# Patient Record
Sex: Female | Born: 1995 | Race: White | Hispanic: No | Marital: Single | Smoking: Former smoker
Health system: Southern US, Community
[De-identification: ages and names within clinical notes are randomized; demographics above are authoritative.]

## PROBLEM LIST (undated history)

## (undated) ENCOUNTER — Inpatient Hospital Stay (HOSPITAL_COMMUNITY): Payer: Self-pay

## (undated) VITALS — BP 84/58 | HR 75 | Temp 98.4°F | Resp 16 | Ht 65.55 in | Wt 219.4 lb

## (undated) DIAGNOSIS — T8859XA Other complications of anesthesia, initial encounter: Secondary | ICD-10-CM

## (undated) DIAGNOSIS — K219 Gastro-esophageal reflux disease without esophagitis: Secondary | ICD-10-CM

## (undated) DIAGNOSIS — Z9889 Other specified postprocedural states: Secondary | ICD-10-CM

## (undated) DIAGNOSIS — E669 Obesity, unspecified: Secondary | ICD-10-CM

## (undated) DIAGNOSIS — R569 Unspecified convulsions: Secondary | ICD-10-CM

## (undated) DIAGNOSIS — F419 Anxiety disorder, unspecified: Secondary | ICD-10-CM

## (undated) DIAGNOSIS — F329 Major depressive disorder, single episode, unspecified: Secondary | ICD-10-CM

## (undated) DIAGNOSIS — A6000 Herpesviral infection of urogenital system, unspecified: Secondary | ICD-10-CM

## (undated) DIAGNOSIS — F32A Depression, unspecified: Secondary | ICD-10-CM

## (undated) DIAGNOSIS — D649 Anemia, unspecified: Secondary | ICD-10-CM

## (undated) DIAGNOSIS — T4145XA Adverse effect of unspecified anesthetic, initial encounter: Secondary | ICD-10-CM

## (undated) DIAGNOSIS — X838XXA Intentional self-harm by other specified means, initial encounter: Secondary | ICD-10-CM

## (undated) DIAGNOSIS — R112 Nausea with vomiting, unspecified: Secondary | ICD-10-CM

## (undated) DIAGNOSIS — G4700 Insomnia, unspecified: Secondary | ICD-10-CM

## (undated) HISTORY — PX: COSMETIC SURGERY: SHX468

---
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
## 2017-01-02 ENCOUNTER — Ambulatory Visit (INDEPENDENT_AMBULATORY_CARE_PROVIDER_SITE_OTHER): Payer: Medicaid Other | Admitting: Family Medicine

## 2017-01-02 ENCOUNTER — Encounter: Payer: Self-pay | Admitting: Family Medicine

## 2017-01-02 VITALS — BP 107/72 | HR 79 | Temp 97.2°F | Ht 64.0 in | Wt 238.0 lb

## 2017-01-02 DIAGNOSIS — R140 Abdominal distension (gaseous): Secondary | ICD-10-CM

## 2017-01-02 DIAGNOSIS — K921 Melena: Secondary | ICD-10-CM

## 2017-01-02 DIAGNOSIS — R110 Nausea: Secondary | ICD-10-CM

## 2017-01-02 DIAGNOSIS — N926 Irregular menstruation, unspecified: Secondary | ICD-10-CM | POA: Diagnosis not present

## 2017-01-02 DIAGNOSIS — R197 Diarrhea, unspecified: Secondary | ICD-10-CM

## 2017-01-02 LAB — PREGNANCY, URINE: Preg Test, Ur: NEGATIVE

## 2017-01-02 NOTE — Progress Notes (Signed)
BP 107/72   Pulse 79   Temp (!) 97.2 F (36.2 C) (Oral)   Ht '5\' 4"'  (1.626 m)   Wt 238 lb (108 kg)   BMI 40.85 kg/m    Subjective:    Patient ID: Colleen Valentine, female    DOB: [birth year], 21 y.o.   MRN: [id number]  HPI: Colleen Valentine is a 21 y.o. female presenting on 01/02/2017 for Abdominal Pain (pt here today c/o diarrhea and constipation, rectal bleeding and severe bloating.)   HPI Abdominal pain and diarrhea and bloating and constipation Patient comes in complaining of abdominal pain and diarrhea bloating and constipation and bright red blood in stool and dark stools that has been bothering her since about 3-4 months ago. She says she will alternate into being constipated at times flip right back into diarrhea. She says she had 22 episodes of diarrhea but most are very small yesterday. She does feel little relief from the stools but then it comes right back. She was seen bright red blood that has been reduced over the past week but she has had some darker stools as well. She denies any fevers but has had some chills. She denies any pain radiating anywhere else. She denies any urinary problems or symptoms or vaginal issues or discharge.  Patient is sexually active and has not used any form of contraception. Her last menstrual period was July 16, there is a chance that she could possibly be pregnant per her. We will run a urine pregnancy today.  Relevant past medical, surgical, family and social history reviewed and updated as indicated. Interim medical history since our last visit reviewed. Allergies and medications reviewed and updated.  Review of Systems  Constitutional: Negative for chills and fever.  Eyes: Negative for visual disturbance.  Respiratory: Negative for chest tightness and shortness of breath.   Cardiovascular: Negative for chest pain and leg swelling.  Gastrointestinal: Positive for abdominal distention, abdominal pain, anal bleeding, blood in stool, constipation,  diarrhea and nausea. Negative for rectal pain and vomiting.  Genitourinary: Negative for decreased urine volume, difficulty urinating, dysuria, flank pain, frequency, hematuria, pelvic pain, vaginal bleeding, vaginal discharge and vaginal pain.  Musculoskeletal: Negative for back pain and gait problem.  Skin: Negative for rash.  Neurological: Negative for dizziness, light-headedness and headaches.  Psychiatric/Behavioral: Negative for agitation and behavioral problems.  All other systems reviewed and are negative.   Per HPI unless specifically indicated above        Objective:    BP 107/72   Pulse 79   Temp (!) 97.2 F (36.2 C) (Oral)   Ht '5\' 4"'  (1.626 m)   Wt 238 lb (108 kg)   BMI 40.85 kg/m   Wt Readings from Last 3 Encounters:  01/02/17 238 lb (108 kg) (>99 %, Z= 2.38)*  11/15/16 242 lb (109.8 kg) (>99 %, Z= 2.41)*  10/09/16 250 lb (113.4 kg) (>99 %, Z= 2.47)*   * Growth percentiles are based on CDC 2-20 Years data.    Physical Exam  Constitutional: She is oriented to person, place, and time. She appears well-developed and well-nourished. No distress.  Eyes: Conjunctivae are normal.  Cardiovascular: Normal rate, regular rhythm, normal heart sounds and intact distal pulses.   No murmur heard. Pulmonary/Chest: Effort normal and breath sounds normal. No respiratory distress. She has no wheezes. She has no rales.  Abdominal: Soft. She exhibits no distension. There is no hepatosplenomegaly. There is tenderness in the periumbilical area. There is no rigidity, no rebound,  no guarding and no CVA tenderness.  Musculoskeletal: Normal range of motion. She exhibits no edema or tenderness.  Neurological: She is alert and oriented to person, place, and time. Coordination normal.  Skin: Skin is warm and dry. No rash noted. She is not diaphoretic.  Psychiatric: She has a normal mood and affect. Her behavior is normal.  Nursing note and vitals reviewed.  Urine preg neg       Assessment & Plan:   Problem List Items Addressed This Visit    None    Visit Diagnoses    Diarrhea, unspecified type    -  Primary   Patient has chronic diarrhea bloating and blood in her stool is been going on for 3 or 4 months. She has taken over-the-counter  medications without improvement   Relevant Orders   Cdiff NAA+O+P+Stool Culture   CBC with Differential/Platelet   CMP14+EGFR   Ambulatory referral to Gastroenterology   Blood in stool       Relevant Orders   CBC with Differential/Platelet   CMP14+EGFR   Ambulatory referral to Gastroenterology   Missed period       Relevant Orders   Pregnancy, urine   Nausea       Concern for possible IBS versus IBD   Relevant Orders   CMP14+EGFR   Ambulatory referral to Gastroenterology   Bloating       Relevant Orders   Ambulatory referral to Gastroenterology       Follow up plan: Return if symptoms worsen or fail to improve.  Counseling provided for all of the vaccine components Orders Placed This Encounter  Procedures  . Cdiff NAA+O+P+Stool Culture  . CBC with Differential/Platelet  . Pregnancy, urine  . CMP14+EGFR  . Ambulatory referral to Gastroenterology    Caryl Pina, MD [facility] 01/02/2017, 11:51 AM

## 2017-01-03 LAB — CMP14+EGFR
ALT: 19 IU/L (ref 0–32)
AST: 17 IU/L (ref 0–40)
Albumin/Globulin Ratio: 1.6 (ref 1.2–2.2)
Albumin: 4.3 g/dL (ref 3.5–5.5)
Alkaline Phosphatase: 94 IU/L (ref 43–101)
BUN/Creatinine Ratio: 11 (ref 9–23)
BUN: 7 mg/dL (ref 6–20)
Bilirubin Total: 0.2 mg/dL (ref 0.0–1.2)
CO2: 22 mmol/L (ref 20–29)
Calcium: 9.3 mg/dL (ref 8.7–10.2)
Chloride: 106 mmol/L (ref 96–106)
Creatinine, Ser: 0.66 mg/dL (ref 0.57–1.00)
GFR calc Af Amer: 149 mL/min/{1.73_m2} (ref >59)
GFR calc non Af Amer: 129 mL/min/{1.73_m2} (ref >59)
Globulin, Total: 2.7 g/dL (ref 1.5–4.5)
Glucose: 95 mg/dL (ref 65–99)
Potassium: 4.3 mmol/L (ref 3.5–5.2)
Sodium: 145 mmol/L — ABNORMAL HIGH (ref 134–144)
Total Protein: 7 g/dL (ref 6.0–8.5)

## 2017-01-03 LAB — CBC WITH DIFFERENTIAL/PLATELET
Basophils Absolute: 0 10*3/uL (ref 0.0–0.2)
Basos: 1 %
EOS (ABSOLUTE): 0.3 10*3/uL (ref 0.0–0.4)
Eos: 4 %
Hematocrit: 41.2 % (ref 34.0–46.6)
Hemoglobin: 13.9 g/dL (ref 11.1–15.9)
Immature Grans (Abs): 0 10*3/uL (ref 0.0–0.1)
Immature Granulocytes: 0 %
Lymphocytes Absolute: 2.1 10*3/uL (ref 0.7–3.1)
Lymphs: 28 %
MCH: 31.1 pg (ref 26.6–33.0)
MCHC: 33.7 g/dL (ref 31.5–35.7)
MCV: 92 fL (ref 79–97)
Monocytes Absolute: 0.5 10*3/uL (ref 0.1–0.9)
Monocytes: 6 %
Neutrophils Absolute: 4.6 10*3/uL (ref 1.4–7.0)
Neutrophils: 61 %
Platelets: 265 10*3/uL (ref 150–379)
RBC: 4.47 x10E6/uL (ref 3.77–5.28)
RDW: 13.7 % (ref 12.3–15.4)
WBC: 7.6 10*3/uL (ref 3.4–10.8)

## 2017-01-07 ENCOUNTER — Encounter: Payer: Self-pay | Admitting: Internal Medicine

## 2017-01-14 ENCOUNTER — Telehealth: Payer: Self-pay | Admitting: Family

## 2017-01-14 NOTE — Telephone Encounter (Signed)
Aware,  We have not prescribed any antibiotics for her.  She thinks it may have been th OB clinic.

## 2017-02-27 ENCOUNTER — Other Ambulatory Visit: Payer: Self-pay

## 2017-02-27 ENCOUNTER — Encounter: Payer: Self-pay | Admitting: Gastroenterology

## 2017-02-27 ENCOUNTER — Telehealth: Payer: Self-pay

## 2017-02-27 ENCOUNTER — Ambulatory Visit (INDEPENDENT_AMBULATORY_CARE_PROVIDER_SITE_OTHER): Payer: Medicaid Other | Admitting: Gastroenterology

## 2017-02-27 VITALS — BP 120/87 | HR 83 | Temp 97.5°F | Ht 65.0 in | Wt 234.2 lb

## 2017-02-27 DIAGNOSIS — R1310 Dysphagia, unspecified: Secondary | ICD-10-CM | POA: Diagnosis not present

## 2017-02-27 DIAGNOSIS — G43A0 Cyclical vomiting, not intractable: Secondary | ICD-10-CM | POA: Diagnosis not present

## 2017-02-27 DIAGNOSIS — R1115 Cyclical vomiting syndrome unrelated to migraine: Secondary | ICD-10-CM

## 2017-02-27 DIAGNOSIS — R197 Diarrhea, unspecified: Secondary | ICD-10-CM | POA: Diagnosis not present

## 2017-02-27 DIAGNOSIS — R109 Unspecified abdominal pain: Secondary | ICD-10-CM

## 2017-02-27 DIAGNOSIS — R1010 Upper abdominal pain, unspecified: Secondary | ICD-10-CM

## 2017-02-27 DIAGNOSIS — R1319 Other dysphagia: Secondary | ICD-10-CM

## 2017-02-27 DIAGNOSIS — K219 Gastro-esophageal reflux disease without esophagitis: Secondary | ICD-10-CM | POA: Diagnosis not present

## 2017-02-27 DIAGNOSIS — K625 Hemorrhage of anus and rectum: Secondary | ICD-10-CM | POA: Diagnosis not present

## 2017-02-27 DIAGNOSIS — R112 Nausea with vomiting, unspecified: Secondary | ICD-10-CM | POA: Insufficient documentation

## 2017-02-27 DIAGNOSIS — R1030 Lower abdominal pain, unspecified: Secondary | ICD-10-CM

## 2017-02-27 MED ORDER — DICYCLOMINE HCL 10 MG PO CAPS: 10.0000 mg | ORAL_CAPSULE | Freq: Three times a day (TID) | ORAL | 0 refills | Status: DC

## 2017-02-27 MED ORDER — NA SULFATE-K SULFATE-MG SULF 17.5-3.13-1.6 GM/177ML PO SOLN: 1.0000 | ORAL | 0 refills | Status: DC

## 2017-02-27 MED ORDER — PANTOPRAZOLE SODIUM 40 MG PO TBEC: 40.0000 mg | DELAYED_RELEASE_TABLET | Freq: Every day | ORAL | 3 refills | Status: DC

## 2017-02-27 NOTE — Patient Instructions (Signed)
1. Start pantoprazole 40 mg daily, 30 minutes before breakfast for acid reflux. 2. Start dicyclomine 10 mg, 30 minutes before meals and at bedtime as needed for diarrhea and abdominal cramping. 3. Four to five days prior to your colonoscopy, please stop dicyclomine so that she can adequately clean her bowels out for your study. 4. Colonoscopy and upper endoscopy as scheduled. Please see separate instructions. 5. Please have your labs and stool test done as soon as possible.

## 2017-02-27 NOTE — Progress Notes (Signed)
Primary Care Physician:  Sharion Balloon, FNP  Primary Gastroenterologist:  Garfield Cornea, MD   Chief Complaint  Patient presents with  . Diarrhea  . Blood In Stools  . Heartburn    HPI:  Colleen Valentine is a 21 y.o. female here At the request of Evelina Dun, FNP for further evaluation of  diarrhea, rectal bleeding, heartburn.  Patient states she's been sick for 2 years with regards to GI symptoms. She has a chronic history of GERD, vomiting with it. Planes of upper abdominal bloating/swelling. Bowel movements alternate between constipation and diarrhea. For the past 6 months she's had a lot of rectal bleeding. When she has a bowel movement she develops abdominal pain and rectal pain. Complains of a lot of gas pain. Sometimes passes blood clots with her stools. He states she's had black stools as recently as last week. Complains of dysphagia to solid food. Try omeprazole in the past for reflux but didn't seem to help. Denies NSAID or aspirin use. Notes that she had her gallbladder out for gallstones when she was 17.    Current Outpatient Prescriptions  Medication Sig Dispense Refill  . valACYclovir (VALTREX) 500 MG tablet Take 500 mg by mouth daily.    .      .      .       No current facility-administered medications for this visit.     Allergies as of 02/27/2017  . (No Known Allergies)    Past Medical History:  Diagnosis Date  . Anxiety   . Depression   . Insomnia   . Obesity   . Suicide [facility])     Past Surgical History:  Procedure Laterality Date  . CHOLECYSTECTOMY    . COSMETIC SURGERY  Age 75   dog bite to face    Family History  Problem Relation Age of Onset  . Alcohol abuse Mother   . Stroke Mother   . Bipolar disorder Mother   . Alcohol abuse Father   . Bipolar disorder Father   . Colon cancer Maternal Grandfather   . Crohn's disease Paternal Grandmother   . Crohn's disease Paternal Aunt   . Crohn's disease Paternal Aunt   . Crohn's disease Paternal  Uncle   . Colon cancer Paternal Uncle   . Celiac disease Neg Hx     Social History   Social History  . Marital status: Single    Spouse name: N/A  . Number of children: N/A  . Years of education: N/A   Occupational History  . 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 Smoking status: Current Every Day Smoker    Packs/day: 1.00    Years: 5.00    Types: Cigarettes    Last attempt to quit: 07/08/2012  . Smokeless tobacco: Never Used     Comment: one pack cigarettes daily  . Alcohol use 0.0 oz/week     Comment: social  . Drug use: Yes    Types: Marijuana     Comment: twice  . Sexual activity: Not Currently    Partners: Male    Birth control/ protection: Other-see comments   Other Topics Concern  . Not on file   Social History Narrative   ** Merged History Encounter **          ROS:  General: Negative for anorexia, weight loss, fever, chills, fatigue, weakness. Eyes: Negative for vision changes.  ENT: Negative for hoarseness,  nasal congestion.See history of present illness. CV: Negative for chest pain, angina, palpitations, dyspnea on exertion, peripheral edema.  Respiratory: Negative for dyspnea at rest, dyspnea on exertion, cough, sputum, wheezing.  GI: See history of present illness. GU:  Negative for dysuria, hematuria, urinary incontinence, urinary frequency, nocturnal urination.  MS: Negative for joint pain, low back pain.  Derm: Negative for rash or itching.  Neuro: Negative for weakness, abnormal sensation, seizure, frequent headaches, memory loss, confusion.  Psych: Negative for anxiety, depression, suicidal ideation, hallucinations.  Endo: Negative for unusual weight change.  Heme: Negative for bruising or bleeding. Allergy: Negative for rash or hives.    Physical Examination:  BP 120/87   Pulse 83   Temp (!) 97.5 F (36.4 C) (Oral)   Ht 5\' 5"  (1.651 m)   Wt 234 lb 3.2 oz (106.2 kg)   LMP 02/15/2017   BMI  38.97 kg/m    General: Well-nourished, well-developed in no acute distress.  Head: Normocephalic, atraumatic.   Eyes: Conjunctiva pink, no icterus. Mouth: Oropharyngeal mucosa moist and pink , no lesions erythema or exudate. Neck: Supple without thyromegaly, masses, or lymphadenopathy.  Lungs: Clear to auscultation bilaterally.  Heart: Regular rate and rhythm, no murmurs rubs or gallops.  Abdomen: Bowel sounds are normal, epigastric tenderness palpation, mild lower abdominal tenderness.  nondistended, no hepatosplenomegaly or masses, no abdominal bruits or    hernia , no rebound or guarding.   Rectal: Not performed Extremities: No lower extremity edema. No clubbing or deformities.  Neuro: Alert and oriented x 4 , grossly normal neurologically.  Skin: Warm and dry, no rash or jaundice.   Psych: Alert and cooperative, normal mood and affect.  Labs: Lab Results  Component Value Date   CREATININE 0.66 01/02/2017   BUN 7 01/02/2017   NA 145 (H) 01/02/2017   K 4.3 01/02/2017   CL 106 01/02/2017   CO2 22 01/02/2017   Lab Results  Component Value Date   ALT 19 01/02/2017   AST 17 01/02/2017   ALKPHOS 94 01/02/2017   BILITOT 0.2 01/02/2017   Lab Results  Component Value Date   WBC 7.6 01/02/2017   HGB 13.9 01/02/2017   HCT 41.2 01/02/2017   MCV 92 01/02/2017   PLT 265 01/02/2017     Imaging Studies: No results found.  Impression/plan:  21 year old female with chronic GI symptoms dating back to her teenage years. She got her gallbladder out when she was 41, states she had gallstones. There she has had chronic GERD associated with vomiting for years. Omeprazole previously did not help. Taking over-the-counter agents without relief. Complains of solid food esophageal dysphagia. Medication she has epigastric pain, lower abdominal pain, diarrhea with blood in the stool. Patient has a significant family history for Crohn's disease and colon cancer.  Given chronicity and severity  of her symptoms, I have offered her an EGD/ED and colonoscopy for further evaluation. Plan repeat sedation in the OR.  I have discussed the risks, alternatives, benefits with regards to but not limited to the risk of reaction to medication, bleeding, infection, perforation and the patient is agreeable to proceed. Written consent to be obtained.  We'll go ahead and check labs for celiac disease, lipase as well as GI pathogen panel. Start pantoprazole 40 mg daily. Trial of dicyclomine 10 mg Q before meals daily at bedtime when necessary. She is aware to stop dicyclomine 4-5 days prior to colonoscopy prep.

## 2017-02-27 NOTE — Telephone Encounter (Signed)
Called and informed pt of pre-op appt 03/21/17 at 1:45pm. Letter mailed.

## 2017-02-28 LAB — IGA: Immunoglobulin A: 246 mg/dL (ref 81–463)

## 2017-02-28 LAB — LIPASE: Lipase: 14 U/L (ref 7–60)

## 2017-02-28 LAB — TISSUE TRANSGLUTAMINASE, IGA: (tTG) Ab, IgA: 1 U/mL

## 2017-03-01 DIAGNOSIS — J209 Acute bronchitis, unspecified: Secondary | ICD-10-CM | POA: Diagnosis not present

## 2017-03-01 DIAGNOSIS — J219 Acute bronchiolitis, unspecified: Secondary | ICD-10-CM | POA: Diagnosis not present

## 2017-03-01 DIAGNOSIS — R6889 Other general symptoms and signs: Secondary | ICD-10-CM | POA: Diagnosis not present

## 2017-03-03 NOTE — Progress Notes (Signed)
Celiac screen, lipase unremarkable. Await stool studies.

## 2017-03-05 ENCOUNTER — Telehealth: Payer: Self-pay

## 2017-03-05 ENCOUNTER — Encounter: Payer: Self-pay | Admitting: Gastroenterology

## 2017-03-05 NOTE — Progress Notes (Signed)
cc'ed to pcp °

## 2017-03-05 NOTE — Telephone Encounter (Signed)
Late entry- spoke with pt yesterday, the work note that was done when she was in the office had the wrong date on it and her employer will not let her come back to work until we fix it. The letter that I did had her returning to work on 03/31/17 instead of 02/28/17. I have completed a new letter and sent it to the patients employer. I apologized to the pt for the mistake. She said she understood.

## 2017-03-13 LAB — GASTROINTESTINAL PATHOGEN PANEL PCR
C. difficile Tox A/B, PCR: NOT DETECTED
Campylobacter, PCR: NOT DETECTED
Cryptosporidium, PCR: NOT DETECTED
E coli (ETEC) LT/ST PCR: NOT DETECTED
E coli (STEC) stx1/stx2, PCR: NOT DETECTED
E coli 0157, PCR: NOT DETECTED
Giardia lamblia, PCR: NOT DETECTED
Norovirus, PCR: NOT DETECTED
Rotavirus A, PCR: NOT DETECTED
Salmonella, PCR: NOT DETECTED
Shigella, PCR: NOT DETECTED

## 2017-03-17 NOTE — Progress Notes (Signed)
Gi pathogen panel negative. Procedures as planned.

## 2017-03-18 NOTE — Patient Instructions (Signed)
Colleen Valentine  03/18/2017     @PREFPERIOPPHARMACY @   Your procedure is scheduled on 03/27/2017.  Report to [facility] at 9:00 A.M.  Call this number if you have problems the morning of surgery:  [phone]   Remember:  Do not eat food or drink liquids after midnight.  Take these medicines the morning of surgery with A SIP OF WATER : Bentyl, Protonix and Valtrex   Do not wear jewelry, make-up or nail polish.  Do not wear lotions, powders, or perfumes, or deoderant.  Do not shave 48 hours prior to surgery.  Men may shave face and neck.  Do not bring valuables to the hospital.  [facility] is not responsible for any belongings or valuables.  Contacts, dentures or bridgework may not be worn into surgery.  Leave your suitcase in the car.  After surgery it may be brought to your room.  For patients admitted to the hospital, discharge time will be determined by your treatment team.  Patients discharged the day of surgery will not be allowed to drive home.   Name and phone number of your driver:   family Special instructions:  n/a  Please read over the following fact sheets that you were given. Care and Recovery After Surgery  Esophagogastroduodenoscopy Esophagogastroduodenoscopy (EGD) is a procedure to examine the lining of the esophagus, stomach, and first part of the small intestine (duodenum). This procedure is done to check for problems such as inflammation, bleeding, ulcers, or growths. During this procedure, a long, flexible, lighted tube with a camera attached (endoscope) is inserted down the throat. Tell a health care provider about:  Any allergies you have.  All medicines you are taking, including vitamins, herbs, eye drops, creams, and over-the-counter medicines.  Any problems you or family members have had with anesthetic medicines.  Any blood disorders you have.  Any surgeries you have had.  Any medical conditions you have.  Whether you are pregnant or may  be pregnant. What are the risks? Generally, this is a safe procedure. However, problems may occur, including:  Infection.  Bleeding.  A tear (perforation) in the esophagus, stomach, or duodenum.  Trouble breathing.  Excessive sweating.  Spasms of the larynx.  A slowed heartbeat.  Low blood pressure.  What happens before the procedure?  Follow instructions from your health care provider about eating or drinking restrictions.  Ask your health care provider about: ? Changing or stopping your regular medicines. This is especially important if you are taking diabetes medicines or blood thinners. ? Taking medicines such as aspirin and ibuprofen. These medicines can thin your blood. Do not take these medicines before your procedure if your health care provider instructs you not to.  Plan to have someone take you home after the procedure.  If you wear dentures, be ready to remove them before the procedure. What happens during the procedure?  To reduce your risk of infection, your health care team will wash or sanitize their hands.  An IV tube will be put in a vein in your hand or arm. You will get medicines and fluids through this tube.  You will be given one or more of the following: ? A medicine to help you relax (sedative). ? A medicine to numb the area (local anesthetic). This medicine may be sprayed into your throat. It will make you feel more comfortable and keep you from gagging or coughing during the procedure. ? A medicine for pain.  A mouth guard may be placed  in your mouth to protect your teeth and to keep you from biting on the endoscope.  You will be asked to lie on your left side.  The endoscope will be lowered down your throat into your esophagus, stomach, and duodenum.  Air will be put into the endoscope. This will help your health care provider see better.  The lining of your esophagus, stomach, and duodenum will be examined.  Your health care provider  may: ? Take a tissue sample so it can be looked at in a lab (biopsy). ? Remove growths. ? Remove objects (foreign bodies) that are stuck. ? Treat any bleeding with medicines or other devices that stop tissue from bleeding. ? Widen (dilate) or stretch narrowed areas of your esophagus and stomach.  The endoscope will be taken out. The procedure may vary among health care providers and hospitals. What happens after the procedure?  Your blood pressure, heart rate, breathing rate, and blood oxygen level will be monitored often until the medicines you were given have worn off.  Do not eat or drink anything until the numbing medicine has worn off and your gag reflex has returned. This information is not intended to replace advice given to you by your health care provider. Make sure you discuss any questions you have with your health care provider. Document Released: 09/13/2004 Document Revised: 10/19/2015 Document Reviewed: 04/06/2015 Elsevier Interactive Patient Education  2018 [employer].  Colonoscopy, Adult A colonoscopy is an exam to look at the entire large intestine. During the exam, a lubricated, bendable tube is inserted into the anus and then passed into the rectum, colon, and other parts of the large intestine. A colonoscopy is often done as a part of normal colorectal screening or in response to certain symptoms, such as anemia, persistent diarrhea, abdominal pain, and blood in the stool. The exam can help screen for and diagnose medical problems, including:  Tumors.  Polyps.  Inflammation.  Areas of bleeding.  Tell a health care provider about:  Any allergies you have.  All medicines you are taking, including vitamins, herbs, eye drops, creams, and over-the-counter medicines.  Any problems you or family members have had with anesthetic medicines.  Any blood disorders you have.  Any surgeries you have had.  Any medical conditions you have.  Any problems you have had  passing stool. What are the risks? Generally, this is a safe procedure. However, problems may occur, including:  Bleeding.  A tear in the intestine.  A reaction to medicines given during the exam.  Infection (rare).  What happens before the procedure? Eating and drinking restrictions Follow instructions from your health care provider about eating and drinking, which may include:  A few days before the procedure - follow a low-fiber diet. Avoid nuts, seeds, dried fruit, raw fruits, and vegetables.  1-3 days before the procedure - follow a clear liquid diet. Drink only clear liquids, such as clear broth or bouillon, black coffee or tea, clear juice, clear soft drinks or sports drinks, gelatin dessert, and popsicles. Avoid any liquids that contain red or purple dye.  On the day of the procedure - do not eat or drink anything during the 2 hours before the procedure, or within the time period that your health care provider recommends.  Bowel prep If you were prescribed an oral bowel prep to clean out your colon:  Take it as told by your health care provider. Starting the day before your procedure, you will need to drink a large amount  of medicated liquid. The liquid will cause you to have multiple loose stools until your stool is almost clear or light green.  If your skin or anus gets irritated from diarrhea, you may use these to relieve the irritation: ? Medicated wipes, such as adult wet wipes with aloe and vitamin E. ? A skin soothing-product like petroleum jelly.  If you vomit while drinking the bowel prep, take a break for up to 60 minutes and then begin the bowel prep again. If vomiting continues and you cannot take the bowel prep without vomiting, call your health care provider.  General instructions  Ask your health care provider about changing or stopping your regular medicines. This is especially important if you are taking diabetes medicines or blood thinners.  Plan to have  someone take you home from the hospital or clinic. What happens during the procedure?  An IV tube may be inserted into one of your veins.  You will be given medicine to help you relax (sedative).  To reduce your risk of infection: ? Your health care team will wash or sanitize their hands. ? Your anal area will be washed with soap.  You will be asked to lie on your side with your knees bent.  Your health care provider will lubricate a long, thin, flexible tube. The tube will have a camera and a light on the end.  The tube will be inserted into your anus.  The tube will be gently eased through your rectum and colon.  Air will be delivered into your colon to keep it open. You may feel some pressure or cramping.  The camera will be used to take images during the procedure.  A small tissue sample may be removed from your body to be examined under a microscope (biopsy). If any potential problems are found, the tissue will be sent to a lab for testing.  If small polyps are found, your health care provider may remove them and have them checked for cancer cells.  The tube that was inserted into your anus will be slowly removed. The procedure may vary among health care providers and hospitals. What happens after the procedure?  Your blood pressure, heart rate, breathing rate, and blood oxygen level will be monitored until the medicines you were given have worn off.  Do not drive for 24 hours after the exam.  You may have a small amount of blood in your stool.  You may pass gas and have mild abdominal cramping or bloating due to the air that was used to inflate your colon during the exam.  It is up to you to get the results of your procedure. Ask your health care provider, or the department performing the procedure, when your results will be ready. This information is not intended to replace advice given to you by your health care provider. Make sure you discuss any questions you have  with your health care provider. Document Released: 05/10/2000 Document Revised: 03/13/2016 Document Reviewed: 07/25/2015 Elsevier Interactive Patient Education  2018 [employer].

## 2017-03-21 ENCOUNTER — Encounter (HOSPITAL_COMMUNITY)
Admission: RE | Admit: 2017-03-21 | Discharge: 2017-03-21 | Disposition: A | Discharge status: Home / Self Care | Payer: Medicaid Other | Source: Ambulatory Visit | Attending: Internal Medicine | Admitting: Internal Medicine

## 2017-03-21 ENCOUNTER — Encounter (HOSPITAL_COMMUNITY): Payer: Self-pay

## 2017-03-24 ENCOUNTER — Telehealth: Payer: Self-pay | Admitting: *Deleted

## 2017-03-24 NOTE — Telephone Encounter (Signed)
Noted  

## 2017-03-24 NOTE — Telephone Encounter (Signed)
Patient has appt 04/07/17 for OV

## 2017-03-24 NOTE — Telephone Encounter (Signed)
Hoyle Sauer called from Pre-op. Patient missed her appt on Friday. She also thought her procedure date was for 03/26/17 Wed. Her actual date is for 03/27/17. Patient reports she is not able to miss work and cancelled her procedure with Hoyle Sauer. I called patient and confirmed she wanted to cancel. She is coming back in for another OV d/t procedure was scheduled with Propofol.

## 2017-03-27 ENCOUNTER — Encounter (HOSPITAL_COMMUNITY): Admission: RE | Payer: Self-pay | Source: Ambulatory Visit

## 2017-03-27 ENCOUNTER — Ambulatory Visit (HOSPITAL_COMMUNITY): Admission: RE | Admit: 2017-03-27 | Payer: Medicaid Other | Source: Ambulatory Visit | Admitting: Internal Medicine

## 2017-03-27 SURGERY — COLONOSCOPY WITH PROPOFOL
Anesthesia: Monitor Anesthesia Care

## 2017-04-07 ENCOUNTER — Ambulatory Visit: Payer: Medicaid Other | Admitting: Gastroenterology

## 2017-05-25 ENCOUNTER — Encounter (HOSPITAL_COMMUNITY): Payer: Self-pay

## 2017-05-25 ENCOUNTER — Other Ambulatory Visit: Payer: Self-pay

## 2017-05-25 ENCOUNTER — Emergency Department (HOSPITAL_COMMUNITY)
Admission: EM | Admit: 2017-05-25 | Discharge: 2017-05-25 | Disposition: A | Discharge status: Home / Self Care | Payer: Medicaid Other | Attending: Emergency Medicine | Admitting: Emergency Medicine

## 2017-05-25 ENCOUNTER — Inpatient Hospital Stay (EMERGENCY_DEPARTMENT_HOSPITAL)
Admission: AD | Admit: 2017-05-25 | Discharge: 2017-05-25 | Disposition: A | Discharge status: Home / Self Care | Payer: Medicaid Other | Source: Ambulatory Visit | Attending: Obstetrics & Gynecology | Admitting: Obstetrics & Gynecology

## 2017-05-25 ENCOUNTER — Inpatient Hospital Stay (HOSPITAL_COMMUNITY): Payer: Medicaid Other

## 2017-05-25 ENCOUNTER — Encounter (HOSPITAL_COMMUNITY): Payer: Self-pay | Admitting: Emergency Medicine

## 2017-05-25 DIAGNOSIS — O99331 Smoking (tobacco) complicating pregnancy, first trimester: Secondary | ICD-10-CM

## 2017-05-25 DIAGNOSIS — R109 Unspecified abdominal pain: Secondary | ICD-10-CM | POA: Insufficient documentation

## 2017-05-25 DIAGNOSIS — Z6790 Unspecified blood type, Rh positive: Secondary | ICD-10-CM

## 2017-05-25 DIAGNOSIS — F329 Major depressive disorder, single episode, unspecified: Secondary | ICD-10-CM

## 2017-05-25 DIAGNOSIS — O99211 Obesity complicating pregnancy, first trimester: Secondary | ICD-10-CM | POA: Insufficient documentation

## 2017-05-25 DIAGNOSIS — O99351 Diseases of the nervous system complicating pregnancy, first trimester: Secondary | ICD-10-CM | POA: Insufficient documentation

## 2017-05-25 DIAGNOSIS — Z79899 Other long term (current) drug therapy: Secondary | ICD-10-CM | POA: Diagnosis not present

## 2017-05-25 DIAGNOSIS — Z5321 Procedure and treatment not carried out due to patient leaving prior to being seen by health care provider: Secondary | ICD-10-CM | POA: Insufficient documentation

## 2017-05-25 DIAGNOSIS — O26899 Other specified pregnancy related conditions, unspecified trimester: Secondary | ICD-10-CM

## 2017-05-25 DIAGNOSIS — O99341 Other mental disorders complicating pregnancy, first trimester: Secondary | ICD-10-CM | POA: Diagnosis not present

## 2017-05-25 DIAGNOSIS — Z3A01 Less than 8 weeks gestation of pregnancy: Secondary | ICD-10-CM

## 2017-05-25 DIAGNOSIS — O26891 Other specified pregnancy related conditions, first trimester: Secondary | ICD-10-CM

## 2017-05-25 DIAGNOSIS — F419 Anxiety disorder, unspecified: Secondary | ICD-10-CM | POA: Insufficient documentation

## 2017-05-25 DIAGNOSIS — O4690 Antepartum hemorrhage, unspecified, unspecified trimester: Secondary | ICD-10-CM

## 2017-05-25 DIAGNOSIS — Z3490 Encounter for supervision of normal pregnancy, unspecified, unspecified trimester: Secondary | ICD-10-CM

## 2017-05-25 DIAGNOSIS — G4700 Insomnia, unspecified: Secondary | ICD-10-CM | POA: Diagnosis not present

## 2017-05-25 DIAGNOSIS — O4691 Antepartum hemorrhage, unspecified, first trimester: Secondary | ICD-10-CM | POA: Insufficient documentation

## 2017-05-25 LAB — URINALYSIS, ROUTINE W REFLEX MICROSCOPIC
Bilirubin Urine: NEGATIVE
Glucose, UA: NEGATIVE mg/dL
Ketones, ur: 5 mg/dL — AB
Nitrite: NEGATIVE
Protein, ur: 30 mg/dL — AB
Specific Gravity, Urine: 1.028 (ref 1.005–1.030)
pH: 6 (ref 5.0–8.0)

## 2017-05-25 LAB — WET PREP, GENITAL
Sperm: NONE SEEN
Trich, Wet Prep: NONE SEEN
Yeast Wet Prep HPF POC: NONE SEEN

## 2017-05-25 LAB — CBC
HCT: 42.4 % (ref 36.0–46.0)
Hemoglobin: 14.4 g/dL (ref 12.0–15.0)
MCH: 31.2 pg (ref 26.0–34.0)
MCHC: 34 g/dL (ref 30.0–36.0)
MCV: 91.8 fL (ref 78.0–100.0)
Platelets: 277 10*3/uL (ref 150–400)
RBC: 4.62 MIL/uL (ref 3.87–5.11)
RDW: 13 % (ref 11.5–15.5)
WBC: 9.1 10*3/uL (ref 4.0–10.5)

## 2017-05-25 LAB — HCG, QUANTITATIVE, PREGNANCY: hCG, Beta Chain, Quant, S: 2240 m[IU]/mL — ABNORMAL HIGH (ref <5)

## 2017-05-25 LAB — POCT PREGNANCY, URINE: Preg Test, Ur: POSITIVE — AB

## 2017-05-25 LAB — ABO/RH: ABO/RH(D): O POS

## 2017-05-25 NOTE — MAU Note (Signed)
Patient presents with vaginal bleeding and cramping since last night, very light bleeding, rates pain 3/10

## 2017-05-25 NOTE — Progress Notes (Addendum)
G1 early pregnancy per pt. Had pinky discharge and now brownish dark discharge.   HPT x2 +  UPT +  1443: Provider at bs assessing. GC and wet prep done.   1545: informed pt she will be next to go to U/S since there were two other pt's ahead of her. Pt verbalized understanding.   1640: d/c instructions given with pt understanding. Pt left unit with [facility] via ambulatory.

## 2017-05-25 NOTE — MAU Note (Signed)
Urine in lab 

## 2017-05-25 NOTE — ED Triage Notes (Signed)
Patient recently found out she was pregnant 2 days ago with 2 positive pregnancy test. Unsure of how far along she is. First pregnancy. Patient states started having some lower abd cramping with light pink spotting last night and today.

## 2017-05-25 NOTE — MAU Provider Note (Signed)
History     CSN: [id number]  Arrival date and time: 05/25/17 1350   First Provider Initiated Contact with Patient 05/25/17 1451      Chief Complaint  Patient presents with  . Vaginal Bleeding  . Abdominal Pain   G1 '@[redacted]w[redacted]d'  by sure LMP here with spotting and cramping. Spotting started last night and was pink, it became brown by today. Cramping started last night. Describes as mild menstrual cramps. Had IC 2-3 days ago. Reports urinary urgency and frequency x3 days. No dysuria or hematuria. No new partner, most recent STD screen negative. Pregnancy is unplanned but she is excited for it.   OB History    Gravida Para Term Preterm AB Living   1             SAB TAB Ectopic Multiple Live Births                  Past Medical History:  Diagnosis Date  . Anxiety   . Depression   . Insomnia   . Obesity   . Suicide [facility])     Past Surgical History:  Procedure Laterality Date  . CHOLECYSTECTOMY    . COSMETIC SURGERY  Age 21   dog bite to face    Family History  Problem Relation Age of Onset  . Alcohol abuse Mother   . Stroke Mother   . Bipolar disorder Mother   . Alcohol abuse Father   . Bipolar disorder Father   . Colon cancer Maternal Grandfather   . Crohn's disease Paternal Grandmother   . Crohn's disease Paternal Aunt   . Crohn's disease Paternal Aunt   . Crohn's disease Paternal Uncle   . Colon cancer Paternal Uncle   . Celiac disease Neg Hx     Social History   Tobacco Use  . Smoking status: Current Every Day Smoker    Packs/day: 1.00    Years: 5.00    Pack years: 5.00    Types: Cigarettes    Last attempt to quit: 07/08/2012    Years since quitting: 4.8  . Smokeless tobacco: Never Used  . Tobacco comment: one pack cigarettes daily  Substance Use Topics  . Alcohol use: Yes    Alcohol/week: 0.0 oz    Comment: social  . Drug use: Yes    Types: Marijuana    Comment: twice    Allergies: No Known Allergies  Medications Prior to Admission  Medication  Sig Dispense Refill Last Dose  . valACYclovir (VALTREX) 500 MG tablet Take 500 mg by mouth daily.   Past Month at Unknown time  . dicyclomine (BENTYL) 10 MG capsule Take 1 capsule (10 mg total) by mouth 4 (four) times daily -  before meals and at bedtime. As needed for cramps and diarrhea (Patient not taking: Reported on 05/25/2017) 90 capsule 0 Not Taking at Unknown time  . Na Sulfate-K Sulfate-Mg Sulf (SUPREP BOWEL PREP KIT) 17.5-3.13-1.6 GM/180ML SOLN Take 1 kit by mouth as directed. (Patient not taking: Reported on 05/25/2017) 1 Bottle 0 Not Taking at Unknown time  . pantoprazole (PROTONIX) 40 MG tablet Take 1 tablet (40 mg total) by mouth daily before breakfast. (Patient not taking: Reported on 05/25/2017) 30 tablet 3 Not Taking at Unknown time    Review of Systems  Constitutional: Negative for fever.  Gastrointestinal: Positive for abdominal pain.  Genitourinary: Positive for frequency, urgency and vaginal bleeding. Negative for dysuria, hematuria and vaginal discharge.   Physical Exam   Blood pressure 119/70,  pulse 82, temperature 98.2 F (36.8 C), temperature source Oral, resp. rate 18, height '5\' 5"'  (1.651 m), weight 234 lb (106.1 kg), last menstrual period 04/16/2017.  Physical Exam  Constitutional: She is oriented to person, place, and time. She appears well-developed and well-nourished. No distress.  HENT:  Head: Normocephalic and atraumatic.  Neck: Normal range of motion.  Cardiovascular: Normal rate.  Respiratory: Effort normal. No respiratory distress.  GI: Soft. She exhibits no distension and no mass. There is no tenderness. There is no rebound and no guarding.  Genitourinary:  Genitourinary Comments: External: no lesions or erythema Vagina: rugated, pink, moist, scant brown discharge Uterus: non enlarged, anteverted, + tender, no CMT Adnexae: no masses, no tenderness left, no tenderness right   Musculoskeletal: Normal range of motion.  Neurological: She is alert and  oriented to person, place, and time.  Skin: Skin is warm and dry.  Psychiatric: She has a normal mood and affect.   Results for orders placed or performed during the hospital encounter of 05/25/17 (from the past 24 hour(s))  Urinalysis, Routine w reflex microscopic     Status: Abnormal   Collection Time: 05/25/17  2:10 PM  Result Value Ref Range   Color, Urine YELLOW YELLOW   APPearance CLOUDY (A) CLEAR   Specific Gravity, Urine 1.028 1.005 - 1.030   pH 6.0 5.0 - 8.0   Glucose, UA NEGATIVE NEGATIVE mg/dL   Hgb urine dipstick LARGE (A) NEGATIVE   Bilirubin Urine NEGATIVE NEGATIVE   Ketones, ur 5 (A) NEGATIVE mg/dL   Protein, ur 30 (A) NEGATIVE mg/dL   Nitrite NEGATIVE NEGATIVE   Leukocytes, UA MODERATE (A) NEGATIVE   RBC / HPF 6-30 0 - 5 RBC/hpf   WBC, UA 6-30 0 - 5 WBC/hpf   Bacteria, UA RARE (A) NONE SEEN   Squamous Epithelial / LPF TOO NUMEROUS TO COUNT (A) NONE SEEN   Mucus PRESENT   Pregnancy, urine POC     Status: Abnormal   Collection Time: 05/25/17  2:35 PM  Result Value Ref Range   Preg Test, Ur POSITIVE (A) NEGATIVE  Wet prep, genital     Status: Abnormal   Collection Time: 05/25/17  2:50 PM  Result Value Ref Range   Yeast Wet Prep HPF POC NONE SEEN NONE SEEN   Trich, Wet Prep NONE SEEN NONE SEEN   Clue Cells Wet Prep HPF POC PRESENT (A) NONE SEEN   WBC, Wet Prep HPF POC MANY (A) NONE SEEN   Sperm NONE SEEN   ABO/Rh     Status: None (Preliminary result)   Collection Time: 05/25/17  2:52 PM  Result Value Ref Range   ABO/RH(D) O POS   CBC     Status: None   Collection Time: 05/25/17  2:52 PM  Result Value Ref Range   WBC 9.1 4.0 - 10.5 K/uL   RBC 4.62 3.87 - 5.11 MIL/uL   Hemoglobin 14.4 12.0 - 15.0 g/dL   HCT 42.4 36.0 - 46.0 %   MCV 91.8 78.0 - 100.0 fL   MCH 31.2 26.0 - 34.0 pg   MCHC 34.0 30.0 - 36.0 g/dL   RDW 13.0 11.5 - 15.5 %   Platelets 277 150 - 400 K/uL  hCG, quantitative, pregnancy     Status: Abnormal   Collection Time: 05/25/17  2:52 PM   Result Value Ref Range   hCG, Beta Chain, Quant, S 2,240 (H) <5 mIU/mL   US Ob Comp Less 14 Wks  Result Date: 05/25/2017  CLINICAL DATA:  Bleeding and cramping since May 24, 2017 EXAM: OBSTETRIC <14 WK [place] AND TRANSVAGINAL OB US TECHNIQUE: Both transabdominal and transvaginal ultrasound examinations were performed for complete evaluation of the gestation as well as the maternal uterus, adnexal regions, and pelvic cul-de-sac. Transvaginal technique was performed to assess early pregnancy. COMPARISON:  None. FINDINGS: Intrauterine gestational sac: Single Yolk sac:  Present Embryo:  Not present Cardiac Activity: Not present Heart Rate:  Not applicable  bpm MSD: 4  mm   5 w   1  d CRL:    mm    w    d                  [place] EDC: Subchorionic hemorrhage:  None visualized. Maternal uterus/adnexae: Bilateral ovaries are normal. IMPRESSION: Single uterine gestational sac is present with a yolk sac present. MSD measures 5 weeks and 1 day. The embryo is not seen. This can be seen in early pregnancy. Short-term follow-up is recommended. Electronically Signed   By: Abelardo Diesel M.D.   On: 05/25/2017 16:45   US Ob Transvaginal  Result Date: 05/25/2017 CLINICAL DATA:  Bleeding and cramping since May 24, 2017 EXAM: OBSTETRIC <14 WK [place] AND TRANSVAGINAL OB US TECHNIQUE: Both transabdominal and transvaginal ultrasound examinations were performed for complete evaluation of the gestation as well as the maternal uterus, adnexal regions, and pelvic cul-de-sac. Transvaginal technique was performed to assess early pregnancy. COMPARISON:  None. FINDINGS: Intrauterine gestational sac: Single Yolk sac:  Present Embryo:  Not present Cardiac Activity: Not present Heart Rate:  Not applicable  bpm MSD: 4  mm   5 w   1  d CRL:    mm    w    d                  [place] EDC: Subchorionic hemorrhage:  None visualized. Maternal uterus/adnexae: Bilateral ovaries are normal. IMPRESSION: Single uterine gestational sac is present with a yolk  sac present. MSD measures 5 weeks and 1 day. The embryo is not seen. This can be seen in early pregnancy. Short-term follow-up is recommended. Electronically Signed   By: Abelardo Diesel M.D.   On: 05/25/2017 16:45    MAU Course  Procedures  MDM Labs and [place] ordered and reviewed. UA contaminated but will send for UC since having sx. Early pregnancy at 5 wks seen on [place]. No [place]. Plans to start care at [facility] in [place], can have viability [place] there. Stable for discharge home.   Assessment and Plan   1. Early stage of pregnancy   2. Abdominal cramping affecting pregnancy   3. Vaginal bleeding during pregnancy   4. Blood type, Rh positive    Discharge home Follow up at [facility] SAB/return precautions Start PNV 1 po daily  Allergies as of 05/25/2017   No Known Allergies     Medication List    STOP taking these medications   dicyclomine 10 MG capsule Commonly known as:  BENTYL   Na Sulfate-K Sulfate-Mg Sulf 17.5-3.13-1.6 GM/177ML Soln Commonly known as:  SUPREP BOWEL PREP KIT   pantoprazole 40 MG tablet Commonly known as:  PROTONIX   valACYclovir 500 MG tablet Commonly known as:  Wille Glaser, CNM 05/25/2017, 4:49 PM

## 2017-05-25 NOTE — Discharge Instructions (Signed)
Vaginal Bleeding During Pregnancy, First Trimester °A small amount of bleeding (spotting) from the vagina is common in early pregnancy. Sometimes the bleeding is normal and is not a problem, and sometimes it is a sign of something serious. Be sure to tell your doctor about any bleeding from your vagina right away. °Follow these instructions at home: °· Watch your condition for any changes. °· Follow your doctor's instructions about how active you can be. °· If you are on bed rest: °? You may need to stay in bed and only get up to use the bathroom. °? You may be allowed to do some activities. °? If you need help, make plans for someone to help you. °· Write down: °? The number of pads you use each day. °? How often you change pads. °? How soaked (saturated) your pads are. °· Do not use tampons. °· Do not douche. °· Do not have sex or orgasms until your doctor says it is okay. °· If you pass any tissue from your vagina, save the tissue so you can show it to your doctor. °· Only take medicines as told by your doctor. °· Do not take aspirin because it can make you bleed. °· Keep all follow-up visits as told by your doctor. °Contact a doctor if: °· You bleed from your vagina. °· You have cramps. °· You have labor pains. °· You have a fever that does not go away after you take medicine. °Get help right away if: °· You have very bad cramps in your back or belly (abdomen). °· You pass large clots or tissue from your vagina. °· You bleed more. °· You feel light-headed or weak. °· You pass out (faint). °· You have chills. °· You are leaking fluid or have a gush of fluid from your vagina. °· You pass out while pooping (having a bowel movement). °This information is not intended to replace advice given to you by your health care provider. Make sure you discuss any questions you have with your health care provider. °Document Released: 09/27/2013 Document Revised: 10/19/2015 Document Reviewed: 01/18/2013 °Elsevier Interactive  Patient Education © 2018 [employer]vier Inc. ° °

## 2017-05-25 NOTE — ED Notes (Signed)
Pt upset about wait time.  Explained to patient we would get her seen as soon as possible and that labs had been ordered regarding pregnancy.  Pt states she will just go somewhere else.  Left ambulatory in no distress.

## 2017-05-26 LAB — CULTURE, OB URINE: Culture: 40000 — AB

## 2017-05-26 LAB — GC/CHLAMYDIA PROBE AMP (~~LOC~~) NOT AT ARMC
Chlamydia: POSITIVE — AB
Neisseria Gonorrhea: NEGATIVE

## 2017-05-27 ENCOUNTER — Telehealth: Payer: Self-pay | Admitting: Student

## 2017-05-27 DIAGNOSIS — R8271 Bacteriuria: Secondary | ICD-10-CM

## 2017-05-27 MED ORDER — AMOXICILLIN 500 MG PO CAPS: 500.0000 mg | ORAL_CAPSULE | Freq: Three times a day (TID) | ORAL | 0 refills | Status: AC

## 2017-05-27 NOTE — Telephone Encounter (Signed)
Pt returned phone call. Verified by name & DOB. Notified of + urine culture & need for abx. Verified allergies & pharmacy.   Jorje Guild, NP

## 2017-05-27 NOTE — Telephone Encounter (Signed)
Left voicemail.  Needs to be treated for GBS UTI.   Jorje Guild, NP

## 2017-05-29 ENCOUNTER — Telehealth: Payer: Self-pay | Admitting: Student

## 2017-05-29 DIAGNOSIS — A749 Chlamydial infection, unspecified: Secondary | ICD-10-CM

## 2017-05-29 MED ORDER — AZITHROMYCIN 500 MG PO TABS
1000.0000 mg | ORAL_TABLET | Freq: Once | ORAL | 0 refills | Status: AC
Start: 2017-05-29 — End: 2017-05-29

## 2017-05-29 NOTE — Telephone Encounter (Addendum)
Colleen Valentine tested positive for  Chlamydia. Patient was called by RN and allergies and pharmacy confirmed. Rx sent to pharmacy of choice.   Jorje Guild, NP 05/29/2017 6:21 PM       ----- Message from Bjorn Loser, RN sent at 05/29/2017  4:38 PM EST ----- This patient tested positive for : chlamydia  She :"has NKDA", I have informed the patient of her results and confirmed her pharmacy is correct in her chart. Please send Rx.   Thank you,   Bjorn Loser, RN   Results faxed to [facility] Department.

## 2017-05-30 ENCOUNTER — Ambulatory Visit: Payer: Medicaid Other | Admitting: Gastroenterology

## 2017-08-22 ENCOUNTER — Ambulatory Visit: Payer: Self-pay | Admitting: Family

## 2017-10-16 ENCOUNTER — Emergency Department (HOSPITAL_COMMUNITY)
Admission: EM | Admit: 2017-10-16 | Discharge: 2017-10-17 | Disposition: A | Discharge status: Home / Self Care | Payer: Medicaid Other | Attending: Emergency Medicine | Admitting: Emergency Medicine

## 2017-10-16 ENCOUNTER — Other Ambulatory Visit: Payer: Self-pay

## 2017-10-16 ENCOUNTER — Encounter (HOSPITAL_COMMUNITY): Payer: Self-pay

## 2017-10-16 DIAGNOSIS — L02412 Cutaneous abscess of left axilla: Secondary | ICD-10-CM | POA: Diagnosis not present

## 2017-10-16 DIAGNOSIS — F17210 Nicotine dependence, cigarettes, uncomplicated: Secondary | ICD-10-CM | POA: Diagnosis not present

## 2017-10-16 DIAGNOSIS — R2232 Localized swelling, mass and lump, left upper limb: Secondary | ICD-10-CM | POA: Diagnosis present

## 2017-10-16 NOTE — ED Triage Notes (Signed)
Pt states growth under left arm on abdomin that started approx 3 weeks ago, noticed it started growing around the 9th of may and its progressively getting more painful/larger. Pt states abscess does not have drainage and has been using hot compresses to treat with no relief.

## 2017-10-17 ENCOUNTER — Ambulatory Visit: Payer: Medicaid Other | Admitting: Gastroenterology

## 2017-10-17 MED ORDER — DOXYCYCLINE HYCLATE 100 MG PO CAPS: 100.0000 mg | ORAL_CAPSULE | Freq: Two times a day (BID) | ORAL | 0 refills | Status: DC

## 2017-10-17 MED ORDER — CEPHALEXIN 500 MG PO CAPS
500.0000 mg | ORAL_CAPSULE | Freq: Once | ORAL | Status: AC
  Administered 2017-10-17: 500 mg via ORAL
  Filled 2017-10-17: qty 1

## 2017-10-17 MED ORDER — TRAMADOL HCL 50 MG PO TABS: ORAL_TABLET | ORAL | 0 refills | Status: DC

## 2017-10-17 MED ORDER — IBUPROFEN 600 MG PO TABS: 600.0000 mg | ORAL_TABLET | Freq: Four times a day (QID) | ORAL | 0 refills | Status: DC

## 2017-10-17 MED ORDER — CEFDINIR 300 MG PO CAPS: 300.0000 mg | ORAL_CAPSULE | Freq: Two times a day (BID) | ORAL | 0 refills | Status: DC

## 2017-10-17 MED ORDER — DOXYCYCLINE HYCLATE 100 MG PO TABS
100.0000 mg | ORAL_TABLET | Freq: Once | ORAL | Status: AC
  Administered 2017-10-17: 100 mg via ORAL
  Filled 2017-10-17: qty 1

## 2017-10-17 MED ORDER — ONDANSETRON HCL 4 MG PO TABS
4.0000 mg | ORAL_TABLET | Freq: Once | ORAL | Status: AC
  Administered 2017-10-17: 4 mg via ORAL
  Filled 2017-10-17: qty 1

## 2017-10-17 MED ORDER — HYDROCODONE-ACETAMINOPHEN 5-325 MG PO TABS
2.0000 | ORAL_TABLET | Freq: Once | ORAL | Status: AC
  Administered 2017-10-17: 2 via ORAL
  Filled 2017-10-17: qty 2

## 2017-10-17 MED ORDER — IBUPROFEN 800 MG PO TABS
800.0000 mg | ORAL_TABLET | Freq: Once | ORAL | Status: AC
  Administered 2017-10-17: 800 mg via ORAL
  Filled 2017-10-17: qty 1

## 2017-10-17 NOTE — ED Provider Notes (Signed)
[facility] EMERGENCY DEPARTMENT Provider Note   CSN: [id number] Arrival date & time: 10/16/17  2334     History   Chief Complaint Chief Complaint  Patient presents with  . Abscess    HPI Colleen Valentine is a 22 y.o. female.  Patient is a 22 year old female who presents to the emergency department with complaint of abscess under the left arm.  Patient states this problems been going on for about 3 weeks.  She has been trying some warm compresses to the area, but states these are not helping and the problem is getting progressively worse.  She says that the pain is spreading from the abscess area into the area around the abscess and at times it goes into her left breast.  She has not had any drainage.  She has not had any high fever reported.  She has no significant medical illnesses that should not interfere with her immune system.  She presents to the emergency department now for assistance with the pain.  The history is provided by the patient.  Abscess  Associated symptoms: no fever     Past Medical History:  Diagnosis Date  . Anxiety   . Depression   . Insomnia   . Obesity   . Suicide [facility])     Patient Active Problem List   Diagnosis Date Noted  . Diarrhea 02/27/2017  . Rectal bleeding 02/27/2017  . Abdominal pain 02/27/2017  . Lower abdominal pain 02/27/2017  . Esophageal dysphagia 02/27/2017  . Nausea with vomiting 02/27/2017  . Current smoker 08/12/2016  . Genital herpes 04/08/2016  . GERD (gastroesophageal reflux disease) 10/17/2015  . Morbid obesity with BMI of 40.0-44.9, adult ([place]) 10/17/2015  . ADHD (attention deficit hyperactivity disorder), combined type 07/21/2012  . MDD (major depressive disorder), recurrent episode, moderate ([place]) 04/01/2011  . Oppositional defiant disorder 04/01/2011  . Polysubstance abuse ([place]) 04/01/2011    Past Surgical History:  Procedure Laterality Date  . CHOLECYSTECTOMY    . COSMETIC SURGERY  Age 53   dog bite to face      OB History    Gravida  1   Para      Term      Preterm      AB      Living        SAB      TAB      Ectopic      Multiple      Live Births               Home Medications    Prior to Admission medications   Medication Sig Start Date End Date Taking? Authorizing Provider  cefdinir (OMNICEF) 300 MG capsule Take 1 capsule (300 mg total) by mouth 2 (two) times daily. 10/17/17   Lily Kocher, PA-C  doxycycline (VIBRAMYCIN) 100 MG capsule Take 1 capsule (100 mg total) by mouth 2 (two) times daily. 10/17/17   Lily Kocher, PA-C  ibuprofen (ADVIL,MOTRIN) 600 MG tablet Take 1 tablet (600 mg total) by mouth 4 (four) times daily. 10/17/17   Lily Kocher, PA-C  traMADol Veatrice Bourbon) 50 MG tablet 1 or 2 po q6h prn pain 10/17/17   Lily Kocher, PA-C    Family History Family History  Problem Relation Age of Onset  . Alcohol abuse Mother   . Stroke Mother   . Bipolar disorder Mother   . Alcohol abuse Father   . Bipolar disorder Father   . Colon cancer Maternal Grandfather   .  Crohn's disease Paternal Grandmother   . Crohn's disease Paternal Aunt   . Crohn's disease Paternal Aunt   . Crohn's disease Paternal Uncle   . Colon cancer Paternal Uncle   . Celiac disease Neg Hx     Social History Social History   Tobacco Use  . Smoking status: Current Every Day Smoker    Packs/day: 1.00    Years: 5.00    Pack years: 5.00    Types: Cigarettes    Last attempt to quit: 07/08/2012    Years since quitting: 5.2  . Smokeless tobacco: Never Used  . Tobacco comment: one pack cigarettes daily  Substance Use Topics  . Alcohol use: Yes    Alcohol/week: 0.0 oz    Comment: social  . Drug use: Yes    Types: Marijuana    Comment: twice     Allergies   Patient has no known allergies.   Review of Systems Review of Systems  Constitutional: Negative for activity change, appetite change, chills and fever.       All ROS Neg except as noted in HPI  HENT: Negative for  nosebleeds.   Eyes: Negative for photophobia and discharge.  Respiratory: Negative for cough, shortness of breath and wheezing.   Cardiovascular: Negative for chest pain and palpitations.  Gastrointestinal: Negative for abdominal pain and blood in stool.  Genitourinary: Negative for dysuria, frequency and hematuria.  Musculoskeletal: Negative for arthralgias, back pain and neck pain.  Skin: Positive for wound.       Patient has an abscess formation under the left axilla.  .  Neurological: Negative for dizziness, seizures and speech difficulty.  Psychiatric/Behavioral: Negative for confusion and hallucinations.     Physical Exam Updated Vital Signs BP 116/69 (BP Location: Right Arm)   Pulse 86   Temp 98.7 F (37.1 C) (Oral)   Resp 16   Ht 5\' 5"  (1.651 m)   Wt 104.3 kg (230 lb)   LMP 08/14/2017   SpO2 100%   Breastfeeding? Unknown   BMI 38.27 kg/m   Physical Exam  Pulmonary/Chest: Right breast exhibits no nipple discharge. Left breast exhibits tenderness. Left breast exhibits no nipple discharge. There is breast tenderness.  There is tenderness of the tail of the left breast extending to the area just above the areola.  There is no drainage from the nipple area.  There is no swelling of the nipple.  Skin:  Patient has a small abscess area of the left axilla.  There is mild increased redness present.  There is significant tenderness around the abscess area and tenderness extending to the tail of the left breast     ED Treatments / Results  Labs (all labs ordered are listed, but only abnormal results are displayed) Labs Reviewed - No data to display  EKG None  Radiology No results found.  Procedures Procedures (including critical care time)  Medications Ordered in ED Medications  ibuprofen (ADVIL,MOTRIN) tablet 800 mg (has no administration in time range)  HYDROcodone-acetaminophen (NORCO/VICODIN) 5-325 MG per tablet 2 tablet (has no administration in time range)    cephALEXin (KEFLEX) capsule 500 mg (has no administration in time range)  doxycycline (VIBRA-TABS) tablet 100 mg (has no administration in time range)  ondansetron (ZOFRAN) tablet 4 mg (has no administration in time range)     Initial Impression / Assessment and Plan / ED Course  I have reviewed the triage vital signs and the nursing notes.  Pertinent labs & imaging results that were available during  my care of the patient were reviewed by me and considered in my medical decision making (see chart for details).       Final Clinical Impressions(s) / ED Diagnoses MDm  Vital signs within normal limits.  Pulse oximetry is 100% on room air.  Within normal limits by my interpretation.  The patient has an abscess of the left axilla.  There is mild increased redness around the area, but no streaking appreciated.  There is pain however that extends to the tail of the breast on the left.  I have asked the patient to use warm Epson salt soaks.  Prescription for Omnicef and doxycycline given to the patient.  Patient will be treated with ibuprofen 4 times daily for pain and inflammation.  Patient will also be given Ultram for more severe pain.  The patient is to follow-up with her primary physician Ms. Hawkes in the office.  She will return to the emergency department if any signs of advancing infection.   Final diagnoses:  Abscess of axilla, left    ED Discharge Orders        Ordered    cefdinir (OMNICEF) 300 MG capsule  2 times daily     10/17/17 0016    doxycycline (VIBRAMYCIN) 100 MG capsule  2 times daily     10/17/17 0016    traMADol (ULTRAM) 50 MG tablet     10/17/17 0016    ibuprofen (ADVIL,MOTRIN) 600 MG tablet  4 times daily     10/17/17 0016       Lily Kocher, PA-C 94/85/46 [id number]    Delora Fuel, MD 50/09/38 [phone]

## 2017-10-17 NOTE — Discharge Instructions (Addendum)
Your vital signs within normal limits.  Your oxygen level is 100% on room air.  You have an abscess of the left axilla.  There is tenderness around this area extending towards the left breast.  Please soak the abscess area in your chest and warm Epson salt water for about 15 minutes daily until this resolves.  Please use 600 mg of ibuprofen with breakfast, lunch, dinner, and at bedtime.  Please use Omnicef and doxycycline 2 times daily with a meal.  May use Ultram for more severe pain.  Please call Ms. Hawkes tomorrow for an appointment next week for follow-up.  Please return to the emergency department if any high fever, unusual red streaking about the abscess area, or signs of advancing infection.

## 2017-11-12 ENCOUNTER — Other Ambulatory Visit: Payer: Self-pay

## 2017-11-12 ENCOUNTER — Encounter (HOSPITAL_COMMUNITY): Payer: Self-pay | Admitting: Emergency Medicine

## 2017-11-12 ENCOUNTER — Emergency Department (HOSPITAL_COMMUNITY)
Admission: EM | Admit: 2017-11-12 | Discharge: 2017-11-12 | Disposition: A | Discharge status: Home / Self Care | Payer: Medicaid Other | Attending: Emergency Medicine | Admitting: Emergency Medicine

## 2017-11-12 DIAGNOSIS — Z202 Contact with and (suspected) exposure to infections with a predominantly sexual mode of transmission: Secondary | ICD-10-CM | POA: Diagnosis not present

## 2017-11-12 DIAGNOSIS — F17210 Nicotine dependence, cigarettes, uncomplicated: Secondary | ICD-10-CM | POA: Diagnosis not present

## 2017-11-12 DIAGNOSIS — N926 Irregular menstruation, unspecified: Secondary | ICD-10-CM | POA: Insufficient documentation

## 2017-11-12 DIAGNOSIS — M5431 Sciatica, right side: Secondary | ICD-10-CM | POA: Insufficient documentation

## 2017-11-12 DIAGNOSIS — R102 Pelvic and perineal pain: Secondary | ICD-10-CM | POA: Diagnosis present

## 2017-11-12 LAB — URINALYSIS, ROUTINE W REFLEX MICROSCOPIC
Bilirubin Urine: NEGATIVE
Glucose, UA: NEGATIVE mg/dL
Hgb urine dipstick: NEGATIVE
Ketones, ur: NEGATIVE mg/dL
Nitrite: NEGATIVE
Protein, ur: NEGATIVE mg/dL
Specific Gravity, Urine: 1.018 (ref 1.005–1.030)
pH: 6 (ref 5.0–8.0)

## 2017-11-12 LAB — I-STAT BETA HCG BLOOD, ED (MC, WL, AP ONLY): I-stat hCG, quantitative: <5 m[IU]/mL (ref <5)

## 2017-11-12 LAB — WET PREP, GENITAL
Clue Cells Wet Prep HPF POC: NONE SEEN
Sperm: NONE SEEN
Trich, Wet Prep: NONE SEEN
Yeast Wet Prep HPF POC: NONE SEEN

## 2017-11-12 LAB — PREGNANCY, URINE: Preg Test, Ur: NEGATIVE

## 2017-11-12 MED ORDER — CEFTRIAXONE SODIUM 250 MG IJ SOLR
250.0000 mg | Freq: Once | INTRAMUSCULAR | Status: AC
  Administered 2017-11-12: 250 mg via INTRAMUSCULAR
  Filled 2017-11-12: qty 250

## 2017-11-12 MED ORDER — AZITHROMYCIN 250 MG PO TABS
1000.0000 mg | ORAL_TABLET | Freq: Once | ORAL | Status: AC
  Administered 2017-11-12: 1000 mg via ORAL
  Filled 2017-11-12: qty 4

## 2017-11-12 MED ORDER — LIDOCAINE HCL (PF) 1 % IJ SOLN
INTRAMUSCULAR | Status: AC
  Administered 2017-11-12: 2 mL
  Filled 2017-11-12: qty 2

## 2017-11-12 NOTE — Discharge Instructions (Signed)
You will be contacted if any of your remaining cultures are positive.  You can also be evaluated at the health dept for STD checks.  Return to ER for any worsening symptoms such as increasing pain, fever, abdominal pain or vomiting.  You can try Ibuprofen 600-800 mg every 6-8 hrs if needed for back pain.

## 2017-11-12 NOTE — ED Triage Notes (Signed)
Pt c/o of pelvic pain with right lower back pain that radiates down right leg x 1 week.  LMP was May 27th but claims to be irregular. Had unprotected sex June 11th.  Increased urinary frequency.

## 2017-11-12 NOTE — ED Provider Notes (Signed)
[facility] EMERGENCY DEPARTMENT Provider Note   CSN: [id number] Arrival date & time: 11/12/17  1140     History   Chief Complaint Chief Complaint  Patient presents with  . Pelvic Pain    HPI Colleen Valentine is a 22 y.o. female.  HPI   Colleen Valentine is a 22 y.o. female who presents to the Emergency Department complaining of crampy pelvic pain, bilateral breast tenderness and irregular menses.  Symptoms have been present for several days.  Admits to unprotected sex on June 11 recalls increased frequency of urination since that time.  No burning, flank pain or hesitancy.  She states her period this month lasted only 2 days where as it typically last 7 days.  She has taken 7 over-the-counter home pregnancy test that were all negative.  She is concerned that she may be pregnant or have PID.  She states she and her sexual partner were both treated for chlamydia last month.  She denies vaginal bleeding, fever, chills, abdominal pain, and unusual or different vaginal discharge. Recurrent right sided low back pain for several days.  Pain radiates into right thigh.  Sx's are not constant.  No numbness or weakness of the extremity, urine or bowel retention or incontinence.    Past Medical History:  Diagnosis Date  . Anxiety   . Depression   . Insomnia   . Obesity   . Suicide [facility])     Patient Active Problem List   Diagnosis Date Noted  . Diarrhea 02/27/2017  . Rectal bleeding 02/27/2017  . Abdominal pain 02/27/2017  . Lower abdominal pain 02/27/2017  . Esophageal dysphagia 02/27/2017  . Nausea with vomiting 02/27/2017  . Current smoker 08/12/2016  . Genital herpes 04/08/2016  . GERD (gastroesophageal reflux disease) 10/17/2015  . Morbid obesity with BMI of 40.0-44.9, adult ([place]) 10/17/2015  . ADHD (attention deficit hyperactivity disorder), combined type 07/21/2012  . MDD (major depressive disorder), recurrent episode, moderate ([place]) 04/01/2011  . Oppositional defiant disorder  04/01/2011  . Polysubstance abuse ([place]) 04/01/2011    Past Surgical History:  Procedure Laterality Date  . CHOLECYSTECTOMY    . COSMETIC SURGERY  Age 66   dog bite to face     OB History    Gravida  1   Para      Term      Preterm      AB      Living        SAB      TAB      Ectopic      Multiple      Live Births               Home Medications    Prior to Admission medications   Medication Sig Start Date End Date Taking? Authorizing Provider  cefdinir (OMNICEF) 300 MG capsule Take 1 capsule (300 mg total) by mouth 2 (two) times daily. 10/17/17   Lily Kocher, PA-C  doxycycline (VIBRAMYCIN) 100 MG capsule Take 1 capsule (100 mg total) by mouth 2 (two) times daily. 10/17/17   Lily Kocher, PA-C  ibuprofen (ADVIL,MOTRIN) 600 MG tablet Take 1 tablet (600 mg total) by mouth 4 (four) times daily. 10/17/17   Lily Kocher, PA-C  traMADol Veatrice Bourbon) 50 MG tablet 1 or 2 po q6h prn pain 10/17/17   Lily Kocher, PA-C    Family History Family History  Problem Relation Age of Onset  . Alcohol abuse Mother   . Stroke Mother   . Bipolar  disorder Mother   . Alcohol abuse Father   . Bipolar disorder Father   . Colon cancer Maternal Grandfather   . Crohn's disease Paternal Grandmother   . Crohn's disease Paternal Aunt   . Crohn's disease Paternal Aunt   . Crohn's disease Paternal Uncle   . Colon cancer Paternal Uncle   . Celiac disease Neg Hx     Social History Social History   Tobacco Use  . Smoking status: Current Every Day Smoker    Packs/day: 1.00    Years: 5.00    Pack years: 5.00    Types: Cigarettes    Last attempt to quit: 07/08/2012    Years since quitting: 5.3  . Smokeless tobacco: Never Used  . Tobacco comment: one pack cigarettes daily  Substance Use Topics  . Alcohol use: Yes    Alcohol/week: 0.0 oz    Comment: social  . Drug use: Yes    Types: Marijuana    Comment: twice     Allergies   Patient has no known allergies.   Review  of Systems Review of Systems  Constitutional: Negative for activity change, appetite change, chills and fever.  Respiratory: Negative for chest tightness and shortness of breath.   Gastrointestinal: Negative for abdominal pain, nausea and vomiting.  Genitourinary: Positive for frequency and pelvic pain. Negative for decreased urine volume, difficulty urinating, dysuria, flank pain, genital sores, hematuria, urgency, vaginal bleeding and vaginal discharge.  Musculoskeletal: Positive for back pain.  Skin: Negative for rash.  Neurological: Negative for dizziness, weakness and numbness.  Hematological: Negative for adenopathy.  Psychiatric/Behavioral: Negative for confusion.  All other systems reviewed and are negative.    Physical Exam Updated Vital Signs BP 129/70 (BP Location: Right Arm)   Pulse 80   Temp 99.2 F (37.3 C) (Oral)   Resp 18   Ht 5\' 5"  (1.651 m)   Wt 104.3 kg (230 lb)   LMP 10/20/2017   SpO2 99%   BMI 38.27 kg/m   Physical Exam  Constitutional: She is oriented to person, place, and time. She appears well-developed and well-nourished. No distress.  HENT:  Head: Atraumatic.  Cardiovascular: Normal rate, regular rhythm and intact distal pulses.  No murmur heard. Pulmonary/Chest: Effort normal and breath sounds normal. No respiratory distress. She has no wheezes. She has no rales.  Abdominal: Soft. Normal appearance. There is no hepatosplenomegaly. There is no tenderness. There is no rigidity, no rebound, no guarding, no CVA tenderness and no tenderness at McBurney's point.   No CVA tenderness  Genitourinary: Uterus normal. There is no tenderness or lesion on the right labia. There is no tenderness or lesion on the left labia. Uterus is not tender. Cervix exhibits no motion tenderness and no discharge. Right adnexum displays no mass and no tenderness. Left adnexum displays no mass and no tenderness. No bleeding in the vagina. Vaginal discharge found.  Genitourinary  Comments: Pelvic exam assisted by nursing.  Musculoskeletal: Normal range of motion. She exhibits no edema.  Right sided lumbar paraspinal tenderness and tenderness at SI joint.  No spinal tenderness.  Neg SLR bilaterally  Neurological: She is alert and oriented to person, place, and time. No sensory deficit. Coordination normal.  Skin: Skin is warm and dry. Capillary refill takes less than 2 seconds. No rash noted.  Nursing note and vitals reviewed.    ED Treatments / Results  Labs (all labs ordered are listed, but only abnormal results are displayed) Labs Reviewed  URINALYSIS, ROUTINE W REFLEX MICROSCOPIC -  Abnormal; Notable for the following components:      Result Value   APPearance CLOUDY (*)    Leukocytes, UA MODERATE (*)    Bacteria, UA RARE (*)    All other components within normal limits  WET PREP, GENITAL  URINE CULTURE  PREGNANCY, URINE  RPR  HIV ANTIBODY (ROUTINE TESTING)  I-STAT BETA HCG BLOOD, ED ([facility], [facility], [facility] ONLY)  GC/CHLAMYDIA PROBE AMP ([place]) NOT AT [facility]    EKG None  Radiology No results found.  Procedures Procedures (including critical care time)  Medications Ordered in ED Medications  azithromycin (ZITHROMAX) tablet 1,000 mg (has no administration in time range)  cefTRIAXone (ROCEPHIN) injection 250 mg (has no administration in time range)     Initial Impression / Assessment and Plan / ED Course  I have reviewed the triage vital signs and the nursing notes.  Pertinent labs & imaging results that were available during my care of the patient were reviewed by me and considered in my medical decision making (see chart for details).     Pt well appearing.  abd is soft. NT on exam.  No concerning sx's for PID, TOA.  Pt was concerned about possible STD and pregnancy.  Tests are neg for pregnancy, will tx for STD exposure.  Return precautions discussed. Sciatica w/o concerning sx's for emergent neurological process.  Pt appears safe for d/c home.      Final Clinical Impressions(s) / ED Diagnoses   Final diagnoses:  Possible exposure to STD  Irregular menses  Sciatica of right side    ED Discharge Orders    None       Bufford Lope 11/13/17 2219    Milton Ferguson, MD 11/13/17 2345

## 2017-11-13 LAB — GC/CHLAMYDIA PROBE AMP (~~LOC~~) NOT AT ARMC
Chlamydia: NEGATIVE
Neisseria Gonorrhea: NEGATIVE

## 2017-11-13 LAB — URINE CULTURE: Culture: NO GROWTH

## 2017-11-13 LAB — HIV ANTIBODY (ROUTINE TESTING W REFLEX): HIV Screen 4th Generation wRfx: NONREACTIVE

## 2017-11-13 LAB — RPR: RPR Ser Ql: NONREACTIVE

## 2017-12-08 DIAGNOSIS — Z113 Encounter for screening for infections with a predominantly sexual mode of transmission: Secondary | ICD-10-CM | POA: Diagnosis not present

## 2017-12-08 DIAGNOSIS — N898 Other specified noninflammatory disorders of vagina: Secondary | ICD-10-CM | POA: Diagnosis not present

## 2017-12-08 DIAGNOSIS — Z3202 Encounter for pregnancy test, result negative: Secondary | ICD-10-CM | POA: Diagnosis not present

## 2017-12-08 DIAGNOSIS — N925 Other specified irregular menstruation: Secondary | ICD-10-CM | POA: Diagnosis not present

## 2017-12-26 ENCOUNTER — Ambulatory Visit (INDEPENDENT_AMBULATORY_CARE_PROVIDER_SITE_OTHER): Payer: Medicaid Other | Admitting: Family Medicine

## 2017-12-26 VITALS — BP 119/89 | HR 71 | Temp 98.8°F

## 2017-12-26 DIAGNOSIS — N926 Irregular menstruation, unspecified: Secondary | ICD-10-CM | POA: Diagnosis not present

## 2017-12-26 DIAGNOSIS — Z113 Encounter for screening for infections with a predominantly sexual mode of transmission: Secondary | ICD-10-CM | POA: Diagnosis not present

## 2017-12-26 DIAGNOSIS — R198 Other specified symptoms and signs involving the digestive system and abdomen: Secondary | ICD-10-CM

## 2017-12-26 DIAGNOSIS — F458 Other somatoform disorders: Secondary | ICD-10-CM

## 2017-12-26 DIAGNOSIS — J029 Acute pharyngitis, unspecified: Secondary | ICD-10-CM | POA: Diagnosis not present

## 2017-12-26 DIAGNOSIS — R0989 Other specified symptoms and signs involving the circulatory and respiratory systems: Secondary | ICD-10-CM

## 2017-12-26 LAB — CULTURE, GROUP A STREP

## 2017-12-26 LAB — RAPID STREP SCREEN (MED CTR MEBANE ONLY): Strep Gp A Ag, IA W/Reflex: NEGATIVE

## 2017-12-26 LAB — PREGNANCY, URINE: Preg Test, Ur: NEGATIVE

## 2017-12-26 NOTE — Patient Instructions (Signed)
Your strep was negative.  You had labs performed today.  You will be contacted with the results of the labs once they are available, usually in the next 3 business days for routine lab work.  If you had a pap smear or biopsy performed, expect to be contacted in about 7-10 days.  It appears that you have a viral upper respiratory infection (cold).  Cold symptoms can last up to 2 weeks.    - Get plenty of rest and drink plenty of fluids. - Try to breathe moist air. Use a cold mist humidifier. - Consume warm fluids (soup or tea) to provide relief for a stuffy nose and to loosen phlegm. - For nasal stuffiness, try saline nasal spray or a Neti Pot. Afrin nasal spray can also be used but this product should not be used longer than 3 days or it will cause rebound nasal stuffiness (worsening nasal congestion). - For sore throat pain relief: use chloraseptic spray, suck on throat lozenges, hard candy or popsicles; gargle with warm salt water (1/4 tsp. salt per 8 oz. of water); and eat soft, bland foods. - Eat a well-balanced diet. If you cannot, ensure you are getting enough nutrients by taking a daily multivitamin. - Avoid dairy products, as they can thicken phlegm. - Avoid alcohol, as it impairs your body's immune system.  CONTACT YOUR DOCTOR IF YOU EXPERIENCE ANY OF THE FOLLOWING: - High fever - Ear pain - Sinus-type headache - Unusually severe cold symptoms - Cough that gets worse while other cold symptoms improve - Flare up of any chronic lung problem, such as asthma - Your symptoms persist longer than 2 weeks   Globus Pharyngeus Globus pharyngeus is a condition that makes it feel like you have a lump in your throat. It may also feel like you have something stuck in the front of your throat. This feeling may come and go. It is not painful, and it does not make it harder to swallow food or liquid. Globus pharyngeus does not cause changes that a health care provider can see during a physical  exam. This condition usually goes away without treatment. What are the causes? Often, no cause can be found. The most common cause of globus pharyngeus is a condition that causes stomach juices to flow back up into the throat (gastroesophageal reflux). Other possible causes include:  Overstimulation of nerves that control swallowing.  Irritation of nerves that control swallowing (neuralgia).  An enlarged gland in the lower neck (thyroid gland).  Growth of tonsil tissue at the base of the tongue (lingual tonsil).  Anxiety.  Depression.  What are the signs or symptoms? The main symptom of this condition is a feeling of a lump in your throat. This feeling usually comes and goes. How is this diagnosed? This condition may be diagnosed after other conditions have been ruled out. You may have tests, such as:  A swallow study.  Ear, nose, and throat evaluation.  An exam of your throat using a thin, flexible tube with a light and camera on the end (endoscopy).  How is this treated? This condition may go away on its own, without treatment. In some cases, antidepressant medicines may be helpful. Follow these instructions at home:  Follow instructions from your health care provider about eating or drinking restrictions.  Take over-the-counter and prescription medicines only as told by your health care provider.  Keep all follow-up visits as told by your health care provider. This is important.  Follow instructions from  your health care provider about home care for gastroesophageal reflux. Your health care provider may recommend that you: ? Do not eat or drink anything that causes heartburn. ? Do not eat heavy meals close to bedtime. ? Do not drink caffeine. ? Do not drink alcohol. ? Raise the head of your bed. ? Sleep on your left side. Contact a health care provider if:  Your symptoms get worse.  You have throat pain.  You have trouble swallowing.  Food or liquid comes back  up into your mouth.  You lose weight without trying. Get help right away if:  You develop swelling in your throat. Summary  Globus pharyngeus is a condition that makes it feel like you have a lump in your throat.  This condition usually goes away without treatment. This information is not intended to replace advice given to you by your health care provider. Make sure you discuss any questions you have with your health care provider. Document Released: 01/17/2016 Document Revised: 01/17/2016 Document Reviewed: 01/17/2016 Elsevier Interactive Patient Education  [employer].

## 2017-12-26 NOTE — Progress Notes (Signed)
Subjective: CC: sore throat PCP: Sharion Balloon, FNP CWC:[facility] Colleen Valentine is a 22 y.o. female presenting to clinic today for:  1. Sore throat Patient reports a globus sensation at the base of her throat.  She notes that this seems to wax and wane but has been present for the last week.  She notes associated hoarse voice.  She says that this makes it hard to swallow.  Reports a fever to 101 F 3 days ago.  No nausea, vomiting, rhinorrhea, congestion.  2. Missed menses/ High risk sexual activity Patient reports that she last had unprotected sex 1 day ago.  She is 7 days overdue for her menstrual cycle.  She would like to be tested for STDs. She was last tested November 12, 2017 and was negative for gonorrhea and chlamydia.  She notes that she is trying to get pregnant.  ROS: Per HPI  No Known Allergies Past Medical History:  Diagnosis Date  . Anxiety   . Depression   . Insomnia   . Obesity   . Suicide ([place])     Current Outpatient Medications:  .  cefdinir (OMNICEF) 300 MG capsule, Take 1 capsule (300 mg total) by mouth 2 (two) times daily., Disp: 10 capsule, Rfl: 0 .  doxycycline (VIBRAMYCIN) 100 MG capsule, Take 1 capsule (100 mg total) by mouth 2 (two) times daily., Disp: 14 capsule, Rfl: 0 .  ibuprofen (ADVIL,MOTRIN) 600 MG tablet, Take 1 tablet (600 mg total) by mouth 4 (four) times daily., Disp: 30 tablet, Rfl: 0 .  traMADol (ULTRAM) 50 MG tablet, 1 or 2 po q6h prn pain, Disp: 12 tablet, Rfl: 0 Social History   Socioeconomic History  . Marital status: Single    Spouse name: Not on file  . Number of children: Not on file  . Years of education: Not on file  . Highest education level: Not on file  Occupational History  . 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 Financial resource strain: Not on file  . Food insecurity:    Worry: Not on file    Inability: Not on file  . Transportation needs:    Medical: Not on file    Non-medical:  Not on file  Tobacco Use  . Smoking status: Current Every Day Smoker    Packs/day: 1.00    Years: 5.00    Pack years: 5.00    Types: Cigarettes    Last attempt to quit: 07/08/2012    Years since quitting: 5.4  . Smokeless tobacco: Never Used  . Tobacco comment: one pack cigarettes daily  Substance and Sexual Activity  . Alcohol use: Yes    Alcohol/week: 0.0 oz    Comment: social  . Drug use: Yes    Types: Marijuana    Comment: twice  . Sexual activity: Not Currently    Partners: Male    Birth control/protection: Other-see comments  Lifestyle  . Physical activity:    Days per week: Not on file    Minutes per session: Not on file  . Stress: Not on file  Relationships  . Social connections:    Talks on phone: Not on file    Gets together: Not on file    Attends religious service: Not on file    Active member of club or organization: Not on file    Attends meetings of clubs or organizations: Not on file    Relationship status: Not on file  .  Intimate partner violence:    Fear of current or ex partner: Not on file    Emotionally abused: Not on file    Physically abused: Not on file    Forced sexual activity: Not on file  Other Topics Concern  . Not on file  Social History Narrative   ** Merged History Encounter **       Family History  Problem Relation Age of Onset  . Alcohol abuse Mother   . Stroke Mother   . Bipolar disorder Mother   . Alcohol abuse Father   . Bipolar disorder Father   . Colon cancer Maternal Grandfather   . Crohn's disease Paternal Grandmother   . Crohn's disease Paternal Aunt   . Crohn's disease Paternal Aunt   . Crohn's disease Paternal Uncle   . Colon cancer Paternal Uncle   . Celiac disease Neg Hx     Objective: Office vital signs reviewed. BP 119/89   Pulse 71   Temp 98.8 F (37.1 C)   LMP 04/16/2017   Physical Examination:  General: Awake, alert, obese, nontoxic, No acute distress HEENT: Normal    Neck: No masses palpated. No  lymphadenopathy    Ears: Tympanic membranes intact, normal light reflex, no erythema, no bulging    Eyes: PERRLA, extraocular membranes intact, sclera white    Nose: nasal turbinates moist, no nasal discharge    Throat: moist mucus membranes, no erythema, no tonsillar exudate.  Airway is patent Cardio: regular rate and rhythm, S1S2 heard, no murmurs appreciated Pulm: clear to auscultation bilaterally, no wheezes, rhonchi or rales; normal work of breathing on room air Extremities: warm, well perfused, No edema, cyanosis or clubbing; +2 pulses bilaterally  Assessment/ Plan: 22 y.o. female   1. Globus sensation Given reports of globus sensation, will check TSH.  We discussed other things that could cause sensation including infection, uncontrolled acid reflux, anxiety.  Will contact patient with results once available. - TSH  2. Missed period Urine study here was negative.  May be too early to tell therefore beta hCG, serum was added to labs today. - Pregnancy, urine - Beta hCG quant (ref lab)  3. Sore throat Likely viral.  Throat culture sent to further evaluate. - Rapid Strep Screen ([facility] ONLY) - Culture, Group A Strep  4. Screen for STD (sexually transmitted disease) We will perform blood labs for STD screening.  Unfortunately, patient did not indicate that she needed this until she was roomed.  She was initially scheduled for sore throat only.  Urine sample was a clean-catch and therefore would not be useful for detecting gonorrhea or chlamydia.  I recommended that she follow-up for pelvic exam if she is worried about gonorrhea or chlamydia.  Recommend safe sex practices. - STD Screen (8)   Orders Placed This Encounter  Procedures  . Rapid Strep Screen ([facility] ONLY)  . Culture, Group A Strep    Order Specific Question:   Source    Answer:   throat  . Pregnancy, urine  . STD Screen (8)  . TSH  . Beta hCG quant (ref lab)    Colleen Norlander, DO [facility] [phone]

## 2017-12-27 LAB — STD SCREEN (8)
HIV Screen 4th Generation wRfx: NONREACTIVE
HSV 1 Glycoprotein G Ab, IgG: 1.1 index — ABNORMAL HIGH (ref 0.00–0.90)
HSV 2 IgG, Type Spec: 7.02 index — ABNORMAL HIGH (ref 0.00–0.90)
Hep A IgM: NEGATIVE
Hep B C IgM: NEGATIVE
Hep C Virus Ab: <0.1 s/co ratio (ref 0.0–0.9)
Hepatitis B Surface Ag: NEGATIVE
RPR Ser Ql: NONREACTIVE

## 2017-12-27 LAB — BETA HCG QUANT (REF LAB): hCG Quant: <1 m[IU]/mL

## 2017-12-27 LAB — TSH: TSH: 0.844 u[IU]/mL (ref 0.450–4.500)

## 2017-12-28 LAB — CULTURE, GROUP A STREP: Strep A Culture: NEGATIVE

## 2017-12-29 ENCOUNTER — Telehealth: Payer: Self-pay | Admitting: Family

## 2017-12-29 NOTE — Telephone Encounter (Signed)
Aware. 

## 2018-01-19 ENCOUNTER — Encounter: Payer: Self-pay | Admitting: *Deleted

## 2018-01-19 ENCOUNTER — Other Ambulatory Visit: Payer: Self-pay | Admitting: *Deleted

## 2018-01-19 ENCOUNTER — Telehealth: Payer: Self-pay | Admitting: *Deleted

## 2018-01-19 ENCOUNTER — Ambulatory Visit (INDEPENDENT_AMBULATORY_CARE_PROVIDER_SITE_OTHER): Payer: Medicaid Other | Admitting: Gastroenterology

## 2018-01-19 ENCOUNTER — Encounter: Payer: Self-pay | Admitting: Gastroenterology

## 2018-01-19 VITALS — BP 120/70 | HR 68 | Temp 97.8°F | Ht 65.0 in | Wt 234.0 lb

## 2018-01-19 DIAGNOSIS — R197 Diarrhea, unspecified: Secondary | ICD-10-CM | POA: Diagnosis not present

## 2018-01-19 DIAGNOSIS — R1010 Upper abdominal pain, unspecified: Secondary | ICD-10-CM

## 2018-01-19 DIAGNOSIS — R112 Nausea with vomiting, unspecified: Secondary | ICD-10-CM | POA: Diagnosis not present

## 2018-01-19 DIAGNOSIS — K625 Hemorrhage of anus and rectum: Secondary | ICD-10-CM | POA: Diagnosis not present

## 2018-01-19 DIAGNOSIS — K219 Gastro-esophageal reflux disease without esophagitis: Secondary | ICD-10-CM

## 2018-01-19 DIAGNOSIS — R109 Unspecified abdominal pain: Secondary | ICD-10-CM

## 2018-01-19 MED ORDER — CLENPIQ 10-3.5-12 MG-GM -GM/160ML PO SOLN: 1.0000 | Freq: Once | ORAL | 0 refills | Status: AC

## 2018-01-19 MED ORDER — PANTOPRAZOLE SODIUM 40 MG PO TBEC: 40.0000 mg | DELAYED_RELEASE_TABLET | Freq: Every day | ORAL | 5 refills | Status: DC

## 2018-01-19 MED ORDER — DICYCLOMINE HCL 10 MG PO CAPS: ORAL_CAPSULE | ORAL | 5 refills | Status: DC

## 2018-01-19 NOTE — Progress Notes (Signed)
cc'ed to pcp °

## 2018-01-19 NOTE — Telephone Encounter (Signed)
Pre-op scheduled for 02/16/18 at 12:45pm. Letter mailed. Patient aware.

## 2018-01-19 NOTE — Patient Instructions (Signed)
1. Try pantoprazole once daily 30 minutes before breakfast for acid reflux/gastritis.  2. Try dicyclomine one capsule up to four times daily (before meal and at bedtime) to relieve abdominal pain, loose/frequent stools.  3. Upper endoscopy and colonoscopy in near future. See separate instructions.  4. Please use birth control to avoid pregnancy until procedures complete. Also would not advise taking pantoprazole or dicyclomine while pregnant without consulting with OB/gyn.

## 2018-01-19 NOTE — Progress Notes (Addendum)
Primary Care Physician:  Sharion Balloon, FNP  Primary Gastroenterologist:  Garfield Cornea, MD   Chief Complaint  Patient presents with  . Gastroesophageal Reflux  . Nausea  . Emesis  . Diarrhea    HPI:  Colleen Valentine is a 22 y.o. female here for follow up of multiple GI issues. She was seen in 02/2017. Complained of 2 years of chronic GERD, dysphagia, abd bloating, alternating constipation/diarrhea, rectal bleeding.   We had planned for TCS/EGD/ED but procedure was cancelled when she did not make her pre-op appointment.   Previously she has failed omeprazole, Nexium, Prevacid, Zantac, TUMS. She has reflux/heartburn all throughout the day. When she wakes up she is already having epigastric burning and nausea. Feels extremely hungry all the time to the point of nausea. Gets little relief with eating. She has vomiting at least twice per week. Complains of generalized abdominal pain, constant and not worse with meals. C/o bloating/crampy type of pain. She has fecal urgency with incontinence if she's not near a restroom. BM 4-5 times per day. Stools soft to loose. Some brbpr. Has tried imodium and Pepto for diarrhea without relief. Celiac serologies negative last year. GI path panel negative as well.  She wants to loose weight. Had been on phentermine in the past which helped suppress her appetite. Was down to 218 pounds at one point. Weight gone back up.   She miscarried at [redacted] weeks gestation back in 05/2017. States she wants to finish school before getting pregnant again. States she's not trying however I note that she had STD screening few weeks back and per PCP note, patient wanting a baby at that time.     patient not complaining of dysphagia at this time. She is concerned about family history of "stomach cancer" and Crohn's disease.   No current outpatient medications on file.   No current facility-administered medications for this visit.     Allergies as of 01/19/2018  . (No Known  Allergies)    Past Medical History:  Diagnosis Date  . Anxiety   . Depression   . Insomnia   . Obesity   . Suicide [facility])     Past Surgical History:  Procedure Laterality Date  . CHOLECYSTECTOMY  2015  . COSMETIC SURGERY  Age 35   dog bite to face    Family History  Problem Relation Age of Onset  . Alcohol abuse Mother   . Stroke Mother   . Bipolar disorder Mother   . Alcohol abuse Father   . Bipolar disorder Father   . Colon cancer Maternal Grandfather   . Crohn's disease Paternal Grandmother   . Crohn's disease Paternal Aunt   . Crohn's disease Paternal Aunt   . Crohn's disease Paternal Uncle   . Colon cancer Paternal Uncle   . Cancer Maternal Aunt        "stomach cancer"  . Celiac disease Neg Hx     Social History   Socioeconomic History  . Marital status: Single    Spouse name: Not on file  . Number of children: Not on file  . Years of education: Not on file  . Highest education level: Not on file  Occupational History  . 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 Financial resource strain: Not on file  . Food insecurity:    Worry: Not on file    Inability: Not on file  . Transportation needs:    Medical: Not  on file    Non-medical: Not on file  Tobacco Use  . Smoking status: Current Some Day Smoker    Packs/day: 1.00    Years: 5.00    Pack years: 5.00    Types: Cigarettes    Last attempt to quit: 07/08/2012    Years since quitting: 5.5  . Smokeless tobacco: Never Used  . Tobacco comment: one pack cigarettes daily  Substance and Sexual Activity  . Alcohol use: Yes    Alcohol/week: 0.0 standard drinks    Comment: social  . Drug use: Yes    Types: Marijuana  . Sexual activity: Not Currently    Partners: Male    Birth control/protection: Other-see comments  Lifestyle  . Physical activity:    Days per week: Not on file    Minutes per session: Not on file  . Stress: Not on file  Relationships  . Social  connections:    Talks on phone: Not on file    Gets together: Not on file    Attends religious service: Not on file    Active member of club or organization: Not on file    Attends meetings of clubs or organizations: Not on file    Relationship status: Not on file  . Intimate partner violence:    Fear of current or ex partner: Not on file    Emotionally abused: Not on file    Physically abused: Not on file    Forced sexual activity: Not on file  Other Topics Concern  . Not on file  Social History Narrative   ** Merged History Encounter **          ROS:  General: Negative for anorexia, weight loss, fever, chills, fatigue, weakness. Eyes: Negative for vision changes.  ENT: Negative for hoarseness, difficulty swallowing , nasal congestion. CV: Negative for chest pain, angina, palpitations, dyspnea on exertion, peripheral edema.  Respiratory: Negative for dyspnea at rest, dyspnea on exertion, cough, sputum, wheezing.  GI: See history of present illness. GU:  Negative for dysuria, hematuria, urinary incontinence, urinary frequency, nocturnal urination.  MS: Negative for joint pain, low back pain.  Derm: Negative for rash or itching.  Neuro: Negative for weakness, abnormal sensation, seizure, frequent headaches, memory loss, confusion.  Psych: Negative for anxiety, depression, suicidal ideation, hallucinations.  Endo: Negative for unusual weight change.  Heme: Negative for bruising or bleeding. Allergy: Negative for rash or hives.    Physical Examination:  BP 120/70   Pulse 68   Temp 97.8 F (36.6 C) (Oral)   Ht 5\' 5"  (1.651 m)   Wt 234 lb (106.1 kg)   LMP 12/29/2017 (Approximate)   BMI 38.94 kg/m    General: Well-nourished, well-developed in no acute distress.  Head: Normocephalic, atraumatic.   Eyes: Conjunctiva pink, no icterus. Mouth: Oropharyngeal mucosa moist and pink , no lesions erythema or exudate. Neck: Supple without thyromegaly, masses, or lymphadenopathy.   Lungs: Clear to auscultation bilaterally.  Heart: Regular rate and rhythm, no murmurs rubs or gallops.  Abdomen: Bowel sounds are normal, mild epigastric tendnerness, nondistended, no hepatosplenomegaly or masses, no abdominal bruits or    hernia , no rebound or guarding.   Rectal: not performed Extremities: No lower extremity edema. No clubbing or deformities.  Neuro: Alert and oriented x 4 , grossly normal neurologically.  Skin: Warm and dry, no rash or jaundice.   Psych: Alert and cooperative, normal mood and affect.  Labs: Lab Results  Component Value Date   TSH 0.844  12/26/2017     Imaging Studies: No results found.  Impression/plan:  22 year old female with chronic GI symptoms dating back several years who presents for follow-up.  Seen initially back in October with plans for EGD with dilation, colonoscopy but unfortunately patient did not show up for her preop visit and her procedure was canceled.  She continues to complain of refractory GERD associated with frequent nausea, intermittent vomiting, epigastric pain.  Chronic diarrhea with fecal urgency and small-volume bright red blood per rectum.  Previously complained of solid food dysphagia, currently without significant concerns.  She is failed multiple over-the-counter meds for acid reflux and diarrhea.  Work-up last year included negative celiac serologies, negative GI pathogen panel.  At this time we will go ahead and offer her EGD and colonoscopy as initially planned.  Plan for deep sedation in the OR.  No plans for esophageal dilation given she is no longer complaining of dysphagia.   I have discussed the risks, alternatives, benefits with regards to but not limited to the risk of reaction to medication, bleeding, infection, perforation and the patient is agreeable to proceed. Written consent to be obtained.  In the interim we will start pantoprazole 40 mg daily and dicyclomine 10 mg before meals and at bedtime as necessary  for abdominal pain, frequent stools/loose stools.  Prescription sent to pharmacy.  I have advised patient she should avoid pregnancy while on the medications provided today as well as in preparation for upcoming procedure.

## 2018-01-20 ENCOUNTER — Other Ambulatory Visit: Payer: Self-pay | Admitting: Family

## 2018-02-12 NOTE — Patient Instructions (Signed)
Colleen Valentine  02/12/2018     @PREFPERIOPPHARMACY @   Your procedure is scheduled on  9/302019 .  Report to [facility] at  1130  A.M.  Call this number if you have problems the morning of surgery:  [phone]   Remember:  Do not eat or drink after midnight.  You may drink clear liquids until (follow the instructions given to you) .  Clear liquids allowed are:                    Water, Juice (non-citric and without pulp), Carbonated beverages, Clear Tea, Black Coffee only, Plain Jell-O only, Gatorade and Plain Popsicles only    Take these medicines the morning of surgery with A SIP OF WATER Protonix.    Do not wear jewelry, make-up or nail polish.  Do not wear lotions, powders, or perfumes, or deodorant.  Do not shave 48 hours prior to surgery.  Men may shave face and neck.  Do not bring valuables to the hospital.  [facility] is not responsible for any belongings or valuables.  Contacts, dentures or bridgework may not be worn into surgery.  Leave your suitcase in the car.  After surgery it may be brought to your room.  For patients admitted to the hospital, discharge time will be determined by your treatment team.  Patients discharged the day of surgery will not be allowed to drive home.   Name and phone number of your driver:   family Special instructions:  Follow the diet and prep instructions given to you by Dr Roseanne Kaufman office.  Please read over the following fact sheets that you were given. Anesthesia Post-op Instructions and Care and Recovery After Surgery       Esophagogastroduodenoscopy Esophagogastroduodenoscopy (EGD) is a procedure to examine the lining of the esophagus, stomach, and first part of the small intestine (duodenum). This procedure is done to check for problems such as inflammation, bleeding, ulcers, or growths. During this procedure, a long, flexible, lighted tube with a camera attached (endoscope) is inserted down the  throat. Tell a health care provider about:  Any allergies you have.  All medicines you are taking, including vitamins, herbs, eye drops, creams, and over-the-counter medicines.  Any problems you or family members have had with anesthetic medicines.  Any blood disorders you have.  Any surgeries you have had.  Any medical conditions you have.  Whether you are pregnant or may be pregnant. What are the risks? Generally, this is a safe procedure. However, problems may occur, including:  Infection.  Bleeding.  A tear (perforation) in the esophagus, stomach, or duodenum.  Trouble breathing.  Excessive sweating.  Spasms of the larynx.  A slowed heartbeat.  Low blood pressure.  What happens before the procedure?  Follow instructions from your health care provider about eating or drinking restrictions.  Ask your health care provider about: ? Changing or stopping your regular medicines. This is especially important if you are taking diabetes medicines or blood thinners. ? Taking medicines such as aspirin and ibuprofen. These medicines can thin your blood. Do not take these medicines before your procedure if your health care provider instructs you not to.  Plan to have someone take you home after the procedure.  If you wear dentures, be ready to remove them before the procedure. What happens during the procedure?  To reduce your risk of infection, your health care team  will wash or sanitize their hands.  An IV tube will be put in a vein in your hand or arm. You will get medicines and fluids through this tube.  You will be given one or more of the following: ? A medicine to help you relax (sedative). ? A medicine to numb the area (local anesthetic). This medicine may be sprayed into your throat. It will make you feel more comfortable and keep you from gagging or coughing during the procedure. ? A medicine for pain.  A mouth guard may be placed in your mouth to protect your  teeth and to keep you from biting on the endoscope.  You will be asked to lie on your left side.  The endoscope will be lowered down your throat into your esophagus, stomach, and duodenum.  Air will be put into the endoscope. This will help your health care provider see better.  The lining of your esophagus, stomach, and duodenum will be examined.  Your health care provider may: ? Take a tissue sample so it can be looked at in a lab (biopsy). ? Remove growths. ? Remove objects (foreign bodies) that are stuck. ? Treat any bleeding with medicines or other devices that stop tissue from bleeding. ? Widen (dilate) or stretch narrowed areas of your esophagus and stomach.  The endoscope will be taken out. The procedure may vary among health care providers and hospitals. What happens after the procedure?  Your blood pressure, heart rate, breathing rate, and blood oxygen level will be monitored often until the medicines you were given have worn off.  Do not eat or drink anything until the numbing medicine has worn off and your gag reflex has returned. This information is not intended to replace advice given to you by your health care provider. Make sure you discuss any questions you have with your health care provider. Document Released: 09/13/2004 Document Revised: 10/19/2015 Document Reviewed: 04/06/2015 Elsevier Interactive Patient Education  2018 [employer]. Esophagogastroduodenoscopy, Care After Refer to this sheet in the next few weeks. These instructions provide you with information about caring for yourself after your procedure. Your health care provider may also give you more specific instructions. Your treatment has been planned according to current medical practices, but problems sometimes occur. Call your health care provider if you have any problems or questions after your procedure. What can I expect after the procedure? After the procedure, it is common to have:  A sore  throat.  Nausea.  Bloating.  Dizziness.  Fatigue.  Follow these instructions at home:  Do not eat or drink anything until the numbing medicine (local anesthetic) has worn off and your gag reflex has returned. You will know that the local anesthetic has worn off when you can swallow comfortably.  Do not drive for 24 hours if you received a medicine to help you relax (sedative).  If your health care provider took a tissue sample for testing during the procedure, make sure to get your test results. This is your responsibility. Ask your health care provider or the department performing the test when your results will be ready.  Keep all follow-up visits as told by your health care provider. This is important. Contact a health care provider if:  You cannot stop coughing.  You are not urinating.  You are urinating less than usual. Get help right away if:  You have trouble swallowing.  You cannot eat or drink.  You have throat or chest pain that gets worse.  You  are dizzy or light-headed.  You faint.  You have nausea or vomiting.  You have chills.  You have a fever.  You have severe abdominal pain.  You have black, tarry, or bloody stools. This information is not intended to replace advice given to you by your health care provider. Make sure you discuss any questions you have with your health care provider. Document Released: 04/29/2012 Document Revised: 10/19/2015 Document Reviewed: 04/06/2015 Elsevier Interactive Patient Education  2018 [employer].  Colonoscopy, Adult A colonoscopy is an exam to look at the large intestine. It is done to check for problems, such as:  Lumps (tumors).  Growths (polyps).  Swelling (inflammation).  Bleeding.  What happens before the procedure? Eating and drinking Follow instructions from your doctor about eating and drinking. These instructions may include:  A few days before the procedure - follow a low-fiber  diet. ? Avoid nuts. ? Avoid seeds. ? Avoid dried fruit. ? Avoid raw fruits. ? Avoid vegetables.  1-3 days before the procedure - follow a clear liquid diet. Avoid liquids that have red or purple dye. Drink only clear liquids, such as: ? Clear broth or bouillon. ? Black coffee or tea. ? Clear juice. ? Clear soft drinks or sports drinks. ? Gelatin dessert. ? Popsicles.  On the day of the procedure - do not eat or drink anything during the 2 hours before the procedure.  Bowel prep If you were prescribed an oral bowel prep:  Take it as told by your doctor. Starting the day before your procedure, you will need to drink a lot of liquid. The liquid will cause you to poop (have bowel movements) until your poop is almost clear or light green.  If your skin or butt gets irritated from diarrhea, you may: ? Wipe the area with wipes that have medicine in them, such as adult wet wipes with aloe and vitamin E. ? Put something on your skin that soothes the area, such as petroleum jelly.  If you throw up (vomit) while drinking the bowel prep, take a break for up to 60 minutes. Then begin the bowel prep again. If you keep throwing up and you cannot take the bowel prep without throwing up, call your doctor.  General instructions  Ask your doctor about changing or stopping your normal medicines. This is important if you take diabetes medicines or blood thinners.  Plan to have someone take you home from the hospital or clinic. What happens during the procedure?  An IV tube may be put into one of your veins.  You will be given medicine to help you relax (sedative).  To reduce your risk of infection: ? Your doctors will wash their hands. ? Your anal area will be washed with soap.  You will be asked to lie on your side with your knees bent.  Your doctor will get a long, thin, flexible tube ready. The tube will have a camera and a light on the end.  The tube will be put into your anus.  The  tube will be gently put into your large intestine.  Air will be delivered into your large intestine to keep it open. You may feel some pressure or cramping.  The camera will be used to take photos.  A small tissue sample may be removed from your body to be looked at under a microscope (biopsy). If any possible problems are found, the tissue will be sent to a lab for testing.  If small growths are found,  your doctor may remove them and have them checked for cancer.  The tube that was put into your anus will be slowly removed. The procedure may vary among doctors and hospitals. What happens after the procedure?  Your doctor will check on you often until the medicines you were given have worn off.  Do not drive for 24 hours after the procedure.  You may have a small amount of blood in your poop.  You may pass gas.  You may have mild cramps or bloating in your belly (abdomen).  It is up to you to get the results of your procedure. Ask your doctor, or the department performing the procedure, when your results will be ready. This information is not intended to replace advice given to you by your health care provider. Make sure you discuss any questions you have with your health care provider. Document Released: 06/15/2010 Document Revised: 03/13/2016 Document Reviewed: 07/25/2015 Elsevier Interactive Patient Education  2017 [employer]  Colonoscopy, Adult, Care After This sheet gives you information about how to care for yourself after your procedure. Your health care provider may also give you more specific instructions. If you have problems or questions, contact your health care provider. What can I expect after the procedure? After the procedure, it is common to have:  A small amount of blood in your stool for 24 hours after the procedure.  Some gas.  Mild abdominal cramping or bloating.  Follow these instructions at home: General instructions   For the first 24 hours  after the procedure: ? Do not drive or use machinery. ? Do not sign important documents. ? Do not drink alcohol. ? Do your regular daily activities at a slower pace than normal. ? Eat soft, easy-to-digest foods. ? Rest often.  Take over-the-counter or prescription medicines only as told by your health care provider.  It is up to you to get the results of your procedure. Ask your health care provider, or the department performing the procedure, when your results will be ready. Relieving cramping and bloating  Try walking around when you have cramps or feel bloated.  Apply heat to your abdomen as told by your health care provider. Use a heat source that your health care provider recommends, such as a moist heat pack or a heating pad. ? Place a towel between your skin and the heat source. ? Leave the heat on for 20-30 minutes. ? Remove the heat if your skin turns bright red. This is especially important if you are unable to feel pain, heat, or cold. You may have a greater risk of getting burned. Eating and drinking  Drink enough fluid to keep your urine clear or pale yellow.  Resume your normal diet as instructed by your health care provider. Avoid heavy or fried foods that are hard to digest.  Avoid drinking alcohol for as long as instructed by your health care provider. Contact a health care provider if:  You have blood in your stool 2-3 days after the procedure. Get help right away if:  You have more than a small spotting of blood in your stool.  You pass large blood clots in your stool.  Your abdomen is swollen.  You have nausea or vomiting.  You have a fever.  You have increasing abdominal pain that is not relieved with medicine. This information is not intended to replace advice given to you by your health care provider. Make sure you discuss any questions you have with your health care  provider. Document Released: 12/26/2003 Document Revised: 02/05/2016 Document  Reviewed: 07/25/2015 Elsevier Interactive Patient Education  2018 [employer] Anesthesia is a term that refers to techniques, procedures, and medicines that help a person stay safe and comfortable during a medical procedure. Monitored anesthesia care, or sedation, is one type of anesthesia. Your anesthesia specialist may recommend sedation if you will be having a procedure that does not require you to be unconscious, such as:  Cataract surgery.  A dental procedure.  A biopsy.  A colonoscopy.  During the procedure, you may receive a medicine to help you relax (sedative). There are three levels of sedation:  Mild sedation. At this level, you may feel awake and relaxed. You will be able to follow directions.  Moderate sedation. At this level, you will be sleepy. You may not remember the procedure.  Deep sedation. At this level, you will be asleep. You will not remember the procedure.  The more medicine you are given, the deeper your level of sedation will be. Depending on how you respond to the procedure, the anesthesia specialist may change your level of sedation or the type of anesthesia to fit your needs. An anesthesia specialist will monitor you closely during the procedure. Let your health care provider know about:  Any allergies you have.  All medicines you are taking, including vitamins, herbs, eye drops, creams, and over-the-counter medicines.  Any use of steroids (by mouth or as a cream).  Any problems you or family members have had with sedatives and anesthetic medicines.  Any blood disorders you have.  Any surgeries you have had.  Any medical conditions you have, such as sleep apnea.  Whether you are pregnant or may be pregnant.  Any use of cigarettes, alcohol, or street drugs. What are the risks? Generally, this is a safe procedure. However, problems may occur, including:  Getting too much medicine  (oversedation).  Nausea.  Allergic reaction to medicines.  Trouble breathing. If this happens, a breathing tube may be used to help with breathing. It will be removed when you are awake and breathing on your own.  Heart trouble.  Lung trouble.  Before the procedure Staying hydrated Follow instructions from your health care provider about hydration, which may include:  Up to 2 hours before the procedure - you may continue to drink clear liquids, such as water, clear fruit juice, black coffee, and plain tea.  Eating and drinking restrictions Follow instructions from your health care provider about eating and drinking, which may include:  8 hours before the procedure - stop eating heavy meals or foods such as meat, fried foods, or fatty foods.  6 hours before the procedure - stop eating light meals or foods, such as toast or cereal.  6 hours before the procedure - stop drinking milk or drinks that contain milk.  2 hours before the procedure - stop drinking clear liquids.  Medicines Ask your health care provider about:  Changing or stopping your regular medicines. This is especially important if you are taking diabetes medicines or blood thinners.  Taking medicines such as aspirin and ibuprofen. These medicines can thin your blood. Do not take these medicines before your procedure if your health care provider instructs you not to.  Tests and exams  You will have a physical exam.  You may have blood tests done to show: ? How well your kidneys and liver are working. ? How well your blood can clot.  General instructions  Plan to  have someone take you home from the hospital or clinic.  If you will be going home right after the procedure, plan to have someone with you for 24 hours.  What happens during the procedure?  Your blood pressure, heart rate, breathing, level of pain and overall condition will be monitored.  An IV tube will be inserted into one of your  veins.  Your anesthesia specialist will give you medicines as needed to keep you comfortable during the procedure. This may mean changing the level of sedation.  The procedure will be performed. After the procedure  Your blood pressure, heart rate, breathing rate, and blood oxygen level will be monitored until the medicines you were given have worn off.  Do not drive for 24 hours if you received a sedative.  You may: ? Feel sleepy, clumsy, or nauseous. ? Feel forgetful about what happened after the procedure. ? Have a sore throat if you had a breathing tube during the procedure. ? Vomit. This information is not intended to replace advice given to you by your health care provider. Make sure you discuss any questions you have with your health care provider. Document Released: 02/06/2005 Document Revised: 10/20/2015 Document Reviewed: 09/03/2015 Elsevier Interactive Patient Education  2018 [place], Care After These instructions provide you with information about caring for yourself after your procedure. Your health care provider may also give you more specific instructions. Your treatment has been planned according to current medical practices, but problems sometimes occur. Call your health care provider if you have any problems or questions after your procedure. What can I expect after the procedure? After your procedure, it is common to:  Feel sleepy for several hours.  Feel clumsy and have poor balance for several hours.  Feel forgetful about what happened after the procedure.  Have poor judgment for several hours.  Feel nauseous or vomit.  Have a sore throat if you had a breathing tube during the procedure.  Follow these instructions at home: For at least 24 hours after the procedure:   Do not: ? Participate in activities in which you could fall or become injured. ? Drive. ? Use heavy machinery. ? Drink alcohol. ? Take sleeping pills or  medicines that cause drowsiness. ? Make important decisions or sign legal documents. ? Take care of children on your own.  Rest. Eating and drinking  Follow the diet that is recommended by your health care provider.  If you vomit, drink water, juice, or soup when you can drink without vomiting.  Make sure you have little or no nausea before eating solid foods. General instructions  Have a responsible adult stay with you until you are awake and alert.  Take over-the-counter and prescription medicines only as told by your health care provider.  If you smoke, do not smoke without supervision.  Keep all follow-up visits as told by your health care provider. This is important. Contact a health care provider if:  You keep feeling nauseous or you keep vomiting.  You feel light-headed.  You develop a rash.  You have a fever. Get help right away if:  You have trouble breathing. This information is not intended to replace advice given to you by your health care provider. Make sure you discuss any questions you have with your health care provider. Document Released: 09/03/2015 Document Revised: 01/03/2016 Document Reviewed: 09/03/2015 Elsevier Interactive Patient Education  [employer].

## 2018-02-16 ENCOUNTER — Encounter (HOSPITAL_COMMUNITY)
Admission: RE | Admit: 2018-02-16 | Discharge: 2018-02-16 | Disposition: A | Discharge status: Home / Self Care | Payer: Medicaid Other | Source: Ambulatory Visit | Attending: Internal Medicine | Admitting: Internal Medicine

## 2018-02-16 ENCOUNTER — Encounter (HOSPITAL_COMMUNITY): Payer: Self-pay

## 2018-02-16 ENCOUNTER — Other Ambulatory Visit: Payer: Self-pay

## 2018-02-16 DIAGNOSIS — Z01812 Encounter for preprocedural laboratory examination: Secondary | ICD-10-CM | POA: Diagnosis not present

## 2018-02-16 HISTORY — DX: Other complications of anesthesia, initial encounter: T88.59XA

## 2018-02-16 HISTORY — DX: Unspecified convulsions: R56.9

## 2018-02-16 HISTORY — DX: Gastro-esophageal reflux disease without esophagitis: K21.9

## 2018-02-16 HISTORY — DX: Adverse effect of unspecified anesthetic, initial encounter: T41.45XA

## 2018-02-16 HISTORY — DX: Nausea with vomiting, unspecified: R11.2

## 2018-02-16 HISTORY — DX: Other specified postprocedural states: Z98.890

## 2018-02-16 LAB — CBC WITH DIFFERENTIAL/PLATELET
Basophils Absolute: 0 10*3/uL (ref 0.0–0.1)
Basophils Relative: 0 %
Eosinophils Absolute: 0.3 10*3/uL (ref 0.0–0.7)
Eosinophils Relative: 3 %
HCT: 41.7 % (ref 36.0–46.0)
Hemoglobin: 13.9 g/dL (ref 12.0–15.0)
Lymphocytes Relative: 27 %
Lymphs Abs: 2.4 10*3/uL (ref 0.7–4.0)
MCH: 31.5 pg (ref 26.0–34.0)
MCHC: 33.3 g/dL (ref 30.0–36.0)
MCV: 94.6 fL (ref 78.0–100.0)
Monocytes Absolute: 0.4 10*3/uL (ref 0.1–1.0)
Monocytes Relative: 4 %
Neutro Abs: 5.9 10*3/uL (ref 1.7–7.7)
Neutrophils Relative %: 66 %
Platelets: 245 10*3/uL (ref 150–400)
RBC: 4.41 MIL/uL (ref 3.87–5.11)
RDW: 12.7 % (ref 11.5–15.5)
WBC: 9 10*3/uL (ref 4.0–10.5)

## 2018-02-16 LAB — BASIC METABOLIC PANEL
Anion gap: 10 (ref 5–15)
BUN: 11 mg/dL (ref 6–20)
CO2: 23 mmol/L (ref 22–32)
Calcium: 8.9 mg/dL (ref 8.9–10.3)
Chloride: 108 mmol/L (ref 98–111)
Creatinine, Ser: 0.67 mg/dL (ref 0.44–1.00)
GFR calc Af Amer: >60 mL/min (ref >60)
GFR calc non Af Amer: >60 mL/min (ref >60)
Glucose, Bld: 138 mg/dL — ABNORMAL HIGH (ref 70–99)
Potassium: 3.6 mmol/L (ref 3.5–5.1)
Sodium: 141 mmol/L (ref 135–145)

## 2018-02-16 LAB — HCG, SERUM, QUALITATIVE: Preg, Serum: NEGATIVE

## 2018-02-19 ENCOUNTER — Encounter (HOSPITAL_COMMUNITY): Payer: Self-pay | Admitting: Anesthesiology

## 2018-02-23 ENCOUNTER — Encounter (HOSPITAL_COMMUNITY): Admission: RE | Payer: Self-pay | Source: Ambulatory Visit

## 2018-02-23 SURGERY — COLONOSCOPY WITH PROPOFOL
Anesthesia: Monitor Anesthesia Care

## 2018-02-24 ENCOUNTER — Telehealth: Payer: Self-pay | Admitting: *Deleted

## 2018-02-24 ENCOUNTER — Ambulatory Visit (HOSPITAL_COMMUNITY): Admission: RE | Admit: 2018-02-24 | Payer: Medicaid Other | Source: Ambulatory Visit | Admitting: Internal Medicine

## 2018-02-24 NOTE — Telephone Encounter (Signed)
ATC patient again but # listed for patient rings busy.

## 2018-02-24 NOTE — Telephone Encounter (Signed)
Carolyn from endo called stating patient did not show up for pre-op yesterday. They called patient and stated she did not have transportation to get there.   Called patient and line rings busy  3. Wcb

## 2018-02-24 NOTE — Telephone Encounter (Signed)
On dpr phone # [phone] is listed. Called and LMOVM.

## 2018-02-24 NOTE — Telephone Encounter (Signed)
Received VM from patient dad requesting to r/s TCS/EGD W/ propofol with RMR. Called # back and LMOVM.

## 2018-02-25 NOTE — Telephone Encounter (Signed)
Letter mailed to patient to call back to schedule procedure

## 2018-03-14 DIAGNOSIS — N760 Acute vaginitis: Secondary | ICD-10-CM | POA: Diagnosis not present

## 2018-03-14 DIAGNOSIS — R42 Dizziness and giddiness: Secondary | ICD-10-CM | POA: Diagnosis not present

## 2018-03-14 DIAGNOSIS — N390 Urinary tract infection, site not specified: Secondary | ICD-10-CM | POA: Diagnosis not present

## 2018-03-30 DIAGNOSIS — N898 Other specified noninflammatory disorders of vagina: Secondary | ICD-10-CM | POA: Diagnosis not present

## 2018-03-30 DIAGNOSIS — T7421XA Adult sexual abuse, confirmed, initial encounter: Secondary | ICD-10-CM | POA: Diagnosis not present

## 2018-03-30 DIAGNOSIS — Z113 Encounter for screening for infections with a predominantly sexual mode of transmission: Secondary | ICD-10-CM | POA: Diagnosis not present

## 2018-04-08 ENCOUNTER — Telehealth: Payer: Self-pay | Admitting: Family Medicine

## 2018-04-08 NOTE — Telephone Encounter (Signed)
I received patient's wet prep results today.  These were obtained at an outside facility.  She had positive clue cells and I was contacting to see if she was treated for bacterial vaginosis or not.  I will also cc patient's PCP on this issue to follow-up on.

## 2018-04-21 ENCOUNTER — Telehealth: Payer: Self-pay | Admitting: *Deleted

## 2018-05-12 ENCOUNTER — Ambulatory Visit (INDEPENDENT_AMBULATORY_CARE_PROVIDER_SITE_OTHER): Payer: Medicaid Other | Admitting: Family

## 2018-05-12 ENCOUNTER — Encounter: Payer: Self-pay | Admitting: Family

## 2018-05-12 VITALS — BP 113/77 | HR 90 | Temp 98.2°F | Ht 65.0 in | Wt 226.0 lb

## 2018-05-12 DIAGNOSIS — R1031 Right lower quadrant pain: Secondary | ICD-10-CM | POA: Diagnosis not present

## 2018-05-12 DIAGNOSIS — R110 Nausea: Secondary | ICD-10-CM

## 2018-05-12 DIAGNOSIS — R109 Unspecified abdominal pain: Secondary | ICD-10-CM | POA: Diagnosis not present

## 2018-05-12 LAB — URINALYSIS, COMPLETE
Bilirubin, UA: NEGATIVE
Glucose, UA: NEGATIVE
Leukocytes, UA: NEGATIVE
Nitrite, UA: NEGATIVE
RBC, UA: NEGATIVE
Specific Gravity, UA: >1.03 — ABNORMAL HIGH (ref 1.005–1.030)
Urobilinogen, Ur: 1 mg/dL (ref 0.2–1.0)
pH, UA: 6 (ref 5.0–7.5)

## 2018-05-12 LAB — MICROSCOPIC EXAMINATION
RBC, UA: NONE SEEN /hpf (ref 0–2)
Renal Epithel, UA: NONE SEEN /hpf
WBC, UA: NONE SEEN /hpf (ref 0–5)

## 2018-05-12 LAB — WET PREP FOR TRICH, YEAST, CLUE
Clue Cell Exam: NEGATIVE
Trichomonas Exam: NEGATIVE
Yeast Exam: NEGATIVE

## 2018-05-12 LAB — PREGNANCY, URINE: Preg Test, Ur: NEGATIVE

## 2018-05-12 NOTE — Progress Notes (Signed)
Subjective:    Patient ID: Colleen Valentine, female    DOB: [birth year], 22 y.o.   MRN: [id number]  Chief Complaint  Patient presents with  . right lower abdominal pain  . Headache  . Nausea    Abdominal Pain  This is a new problem. The current episode started 1 to 4 weeks ago. The onset quality is gradual. The problem occurs constantly. The problem has been gradually worsening. The pain is located in the RLQ. The pain is at a severity of 10/10. The pain is moderate. The quality of the pain is sharp. The abdominal pain does not radiate (back). Associated symptoms include constipation, frequency and nausea. Pertinent negatives include no belching, dysuria, hematuria or vomiting. Exacerbated by: sitting up. The pain is relieved by nothing. Treatments tried: motrin. The treatment provided mild relief.      Review of Systems  Gastrointestinal: Positive for abdominal pain, constipation and nausea. Negative for vomiting.  Genitourinary: Positive for frequency. Negative for dysuria and hematuria.  All other systems reviewed and are negative.      Objective:   Physical Exam Vitals signs reviewed.  Constitutional:      General: She is not in acute distress.    Appearance: She is well-developed.  HENT:     Head: Normocephalic and atraumatic.     Right Ear: External ear normal.  Eyes:     Pupils: Pupils are equal, round, and reactive to light.  Neck:     Thyroid: No thyromegaly.  Cardiovascular:     Rate and Rhythm: Normal rate and regular rhythm.     Heart sounds: Normal heart sounds. No murmur.  Pulmonary:     Effort: Pulmonary effort is normal. No respiratory distress.     Breath sounds: Normal breath sounds. No wheezing.  Abdominal:     General: Bowel sounds are normal. There is no distension.     Palpations: Abdomen is soft.     Tenderness: There is abdominal tenderness (RLQ pain, crying with palpation). There is guarding.  Musculoskeletal: Normal range of motion.      General: No tenderness.  Skin:    General: Skin is warm and dry.  Neurological:     Mental Status: She is alert and oriented to person, place, and time.     Cranial Nerves: No cranial nerve deficit.     Deep Tendon Reflexes: Reflexes are normal and symmetric.  Psychiatric:        Behavior: Behavior normal.        Thought Content: Thought content normal.        Judgment: Judgment normal.       BP 113/77   Pulse 90   Temp 98.2 F (36.8 C) (Oral)   Ht 5\' 5"  (1.651 m)   Wt 226 lb (102.5 kg)   BMI 37.61 kg/m      Assessment & Plan:  Clodagh Odenthal comes in today with chief complaint of right lower abdominal pain; Headache; and Nausea   Diagnosis and orders addressed:  1. Right lower quadrant abdominal pain - WET PREP FOR TRICH, YEAST, CLUE - Urinalysis, Complete - Pregnancy, urine - CT Abdomen Pelvis W Contrast; Future - CBC with Differential/Platelet - Chlamydia/Gonococcus/Trichomonas, NAA  2. Nausea - CT Abdomen Pelvis W Contrast; Future - CBC with Differential/Platelet  We will order stat CT scan. Pt states she can not go tonight because she can not miss work. She states she will go to the ED if pain worsens or does not improve. She  states she will go get her CT scan tomorrow after work if she has not gone to ED.  Labs pending   Evelina Dun, FNP

## 2018-05-12 NOTE — Patient Instructions (Signed)
Appendicitis °The appendix is a finger-shaped tube that is attached to the large intestine. Appendicitis is inflammation of the appendix. Without treatment, appendicitis can cause the appendix to tear (rupture). A ruptured appendix can lead to a life-threatening infection. It can also lead to the formation of a painful collection of pus (abscess) in the appendix. °What are the causes? °This condition may be caused by a blockage in the appendix that leads to infection. The blockage can be due to: °· A ball of stool. °· Enlarged lymph glands. ° °In some cases, the cause may not be known. °What increases the risk? °This condition is more likely to develop in people who are 10-30 years of age. °What are the signs or symptoms? °Symptoms of this condition include: °· Pain around the belly button that moves toward the lower right abdomen. The pain can become more severe as time passes. It gets worse with coughing or sudden movements. °· Tenderness in the lower right abdomen. °· Nausea. °· Vomiting. °· Loss of appetite. °· Fever. °· Constipation. °· Diarrhea. °· Generally not feeling well. ° °How is this diagnosed? °This condition may be diagnosed with: °· A physical exam. °· Blood tests. °· Urine test. ° °To confirm the diagnosis, an ultrasound, MRI, or CT scan may be done. °How is this treated? °This condition is usually treated by taking out the appendix (appendectomy). There are two methods for doing an appendectomy: °· Open appendectomy. In this surgery, the appendix is removed through a large cut (incision) that is made in the lower right abdomen. This procedure may be recommended if: °? You have major scarring from a previous surgery. °? You have a bleeding disorder. °? You are pregnant and are near term. °? You have a condition that makes the laparoscopic procedure impossible, such as an advanced infection or a ruptured appendix. °· Laparoscopic appendectomy. In this surgery, the appendix is removed through small  incisions. This procedure usually causes less pain and fewer problems than an open appendectomy. It also has a shorter recovery time. ° °If the appendix has ruptured and an abscess has formed, a drain may be placed into the abscess to remove fluid and antibiotic medicines may be given through an IV tube. The appendix may or may not need to be removed. °This information is not intended to replace advice given to you by your health care provider. Make sure you discuss any questions you have with your health care provider. °Document Released: 05/13/2005 Document Revised: 09/20/2015 Document Reviewed: 09/28/2014 °Elsevier Interactive Patient Education © 2017 [employer] ° °

## 2018-05-13 ENCOUNTER — Other Ambulatory Visit: Payer: Self-pay

## 2018-05-13 ENCOUNTER — Encounter (HOSPITAL_COMMUNITY): Payer: Self-pay | Admitting: Emergency Medicine

## 2018-05-13 ENCOUNTER — Other Ambulatory Visit: Payer: Self-pay | Admitting: Family

## 2018-05-13 ENCOUNTER — Emergency Department (HOSPITAL_COMMUNITY): Payer: Medicaid Other

## 2018-05-13 ENCOUNTER — Emergency Department (HOSPITAL_COMMUNITY)
Admission: EM | Admit: 2018-05-13 | Discharge: 2018-05-13 | Disposition: A | Discharge status: Home / Self Care | Payer: Medicaid Other | Attending: Emergency Medicine | Admitting: Emergency Medicine

## 2018-05-13 DIAGNOSIS — R161 Splenomegaly, not elsewhere classified: Secondary | ICD-10-CM | POA: Diagnosis not present

## 2018-05-13 DIAGNOSIS — R112 Nausea with vomiting, unspecified: Secondary | ICD-10-CM | POA: Diagnosis present

## 2018-05-13 DIAGNOSIS — F17210 Nicotine dependence, cigarettes, uncomplicated: Secondary | ICD-10-CM | POA: Insufficient documentation

## 2018-05-13 DIAGNOSIS — R1031 Right lower quadrant pain: Secondary | ICD-10-CM | POA: Diagnosis not present

## 2018-05-13 DIAGNOSIS — Z79899 Other long term (current) drug therapy: Secondary | ICD-10-CM | POA: Diagnosis not present

## 2018-05-13 DIAGNOSIS — R1110 Vomiting, unspecified: Secondary | ICD-10-CM | POA: Diagnosis not present

## 2018-05-13 LAB — COMPREHENSIVE METABOLIC PANEL
ALT: 37 U/L (ref 0–44)
AST: 31 U/L (ref 15–41)
Albumin: 4 g/dL (ref 3.5–5.0)
Alkaline Phosphatase: 68 U/L (ref 38–126)
Anion gap: 7 (ref 5–15)
BUN: 12 mg/dL (ref 6–20)
CO2: 25 mmol/L (ref 22–32)
Calcium: 9 mg/dL (ref 8.9–10.3)
Chloride: 106 mmol/L (ref 98–111)
Creatinine, Ser: 0.7 mg/dL (ref 0.44–1.00)
GFR calc Af Amer: >60 mL/min (ref >60)
GFR calc non Af Amer: >60 mL/min (ref >60)
Glucose, Bld: 83 mg/dL (ref 70–99)
Potassium: 3.6 mmol/L (ref 3.5–5.1)
Sodium: 138 mmol/L (ref 135–145)
Total Bilirubin: 0.7 mg/dL (ref 0.3–1.2)
Total Protein: 7.3 g/dL (ref 6.5–8.1)

## 2018-05-13 LAB — CBC WITH DIFFERENTIAL/PLATELET
Abs Immature Granulocytes: 0.03 10*3/uL (ref 0.00–0.07)
Basophils Absolute: 0 10*3/uL (ref 0.0–0.2)
Basophils Absolute: 0 10*3/uL (ref 0.0–0.1)
Basophils Relative: 0 %
Basos: 1 %
EOS (ABSOLUTE): 0.1 10*3/uL (ref 0.0–0.4)
Eos: 3 %
Eosinophils Absolute: 0.1 10*3/uL (ref 0.0–0.5)
Eosinophils Relative: 2 %
HCT: 39.8 % (ref 36.0–46.0)
Hematocrit: 39.9 % (ref 34.0–46.6)
Hemoglobin: 13.4 g/dL (ref 11.1–15.9)
Hemoglobin: 13.4 g/dL (ref 12.0–15.0)
Immature Grans (Abs): 0 10*3/uL (ref 0.0–0.1)
Immature Granulocytes: 1 %
Immature Granulocytes: 1 %
Lymphocytes Absolute: 2.5 10*3/uL (ref 0.7–3.1)
Lymphocytes Relative: 49 %
Lymphs Abs: 2 10*3/uL (ref 0.7–4.0)
Lymphs: 51 %
MCH: 30.7 pg (ref 26.6–33.0)
MCH: 31 pg (ref 26.0–34.0)
MCHC: 33.6 g/dL (ref 31.5–35.7)
MCHC: 33.7 g/dL (ref 30.0–36.0)
MCV: 91 fL (ref 79–97)
MCV: 92.1 fL (ref 80.0–100.0)
Monocytes Absolute: 0.4 10*3/uL (ref 0.1–0.9)
Monocytes Absolute: 0.4 10*3/uL (ref 0.1–1.0)
Monocytes Relative: 9 %
Monocytes: 9 %
Neutro Abs: 1.6 10*3/uL — ABNORMAL LOW (ref 1.7–7.7)
Neutrophils Absolute: 1.7 10*3/uL (ref 1.4–7.0)
Neutrophils Relative %: 39 %
Neutrophils: 35 %
Platelets: 170 10*3/uL (ref 150–450)
Platelets: 158 10*3/uL (ref 150–400)
RBC: 4.37 x10E6/uL (ref 3.77–5.28)
RBC: 4.32 MIL/uL (ref 3.87–5.11)
RDW: 12.2 % — ABNORMAL LOW (ref 12.3–15.4)
RDW: 12.6 % (ref 11.5–15.5)
WBC: 4.8 10*3/uL (ref 3.4–10.8)
WBC: 4.1 10*3/uL (ref 4.0–10.5)
nRBC: 0 % (ref 0.0–0.2)

## 2018-05-13 LAB — URINALYSIS, ROUTINE W REFLEX MICROSCOPIC
Bilirubin Urine: NEGATIVE
Glucose, UA: NEGATIVE mg/dL
Hgb urine dipstick: NEGATIVE
Ketones, ur: NEGATIVE mg/dL
Nitrite: NEGATIVE
Protein, ur: NEGATIVE mg/dL
Specific Gravity, Urine: 1.032 — ABNORMAL HIGH (ref 1.005–1.030)
pH: 6 (ref 5.0–8.0)

## 2018-05-13 LAB — MONONUCLEOSIS SCREEN: Mono Screen: NEGATIVE

## 2018-05-13 LAB — POC URINE PREG, ED: Preg Test, Ur: NEGATIVE

## 2018-05-13 LAB — LIPASE, BLOOD: Lipase: 28 U/L (ref 11–51)

## 2018-05-13 MED ORDER — ONDANSETRON 8 MG PO TBDP: 8.0000 mg | ORAL_TABLET | Freq: Three times a day (TID) | ORAL | 0 refills | Status: DC | PRN

## 2018-05-13 MED ORDER — MORPHINE SULFATE (PF) 4 MG/ML IV SOLN
4.0000 mg | Freq: Once | INTRAVENOUS | Status: AC
  Administered 2018-05-13: 4 mg via INTRAVENOUS
  Filled 2018-05-13: qty 1

## 2018-05-13 MED ORDER — ONDANSETRON HCL 4 MG/2ML IJ SOLN
4.0000 mg | Freq: Once | INTRAMUSCULAR | Status: AC
  Administered 2018-05-13: 4 mg via INTRAVENOUS
  Filled 2018-05-13: qty 2

## 2018-05-13 MED ORDER — ONDANSETRON HCL 4 MG PO TABS
4.0000 mg | ORAL_TABLET | Freq: Once | ORAL | Status: AC
  Administered 2018-05-13: 4 mg via ORAL
  Filled 2018-05-13: qty 1

## 2018-05-13 MED ORDER — HYDROCODONE-ACETAMINOPHEN 5-325 MG PO TABS: 1.0000 | ORAL_TABLET | ORAL | 0 refills | Status: DC | PRN

## 2018-05-13 MED ORDER — IOPAMIDOL (ISOVUE-300) INJECTION 61%
100.0000 mL | Freq: Once | INTRAVENOUS | Status: AC | PRN
  Administered 2018-05-13: 100 mL via INTRAVENOUS

## 2018-05-13 MED ORDER — SODIUM CHLORIDE 0.9 % IV BOLUS
1000.0000 mL | Freq: Once | INTRAVENOUS | Status: AC
  Administered 2018-05-13: 1000 mL via INTRAVENOUS

## 2018-05-13 NOTE — Discharge Instructions (Addendum)
Your lab tests are stable today and as discussed, your spleen is enlarged which is possibly the reason for your abdominal pain (although your spleen is located in your left upper abdomen).  Your lab tests are normal today including your mono test (sometimes the source of spleen enlargement).  You will need close followup with your primary doctor for a recheck and other testing to help determine the reason for your enlarged spleen.  Your blood tests today help rule out bad reasons.  In the interim, avoid strenuous activity and any activity where you risk falling (no ladder climbing, etc).   You may take the hydrocodone prescribed for pain relief.  This will make you drowsy - do not drive within 4 hours of taking this medication.

## 2018-05-13 NOTE — ED Provider Notes (Signed)
[facility] EMERGENCY DEPARTMENT Provider Note   CSN: [id number] Arrival date & time: 05/13/18  9485     History   Chief Complaint Chief Complaint  Patient presents with  . appendicitis    HPI Colleen Valentine is a 22 y.o. female presenting with a 6-day history of nausea, vomiting, intermittent fever, T-max 100.64 days ago along with generalized abdominal pain which is now localized into the right lower abdomen.  She was seen by her PCP yesterday and laboratory testing, urinalysis along with a pelvic exam were unremarkable for the source of her symptoms.  It was recommended that she obtain a CT scan which she was planning to have done today to assess for possible appendicitis.  She tried to work this morning but her pain became too severe and she has been unable to keep anything down by mouth since 6 AM.  She denies hematemesis, diarrhea, she endorses having low bilateral back pain, but states her urinalysis and kidney tests yesterday were negative.  She denies dysuria.  She and her PCP yesterday were fairly concerned about probable appendicitis.  She has had no treatments prior to arrival.She tried to eat breakfast this morning which she promptly vomited.  The history is provided by the patient.    Past Medical History:  Diagnosis Date  . Anxiety   . Complication of anesthesia   . Depression   . GERD (gastroesophageal reflux disease)   . Insomnia   . Obesity   . PONV (postoperative nausea and vomiting)   . Seizures ([place])    once in 2016 due to alcohol poisioning  . Suicide [facility])     Patient Active Problem List   Diagnosis Date Noted  . Diarrhea 02/27/2017  . Rectal bleeding 02/27/2017  . Abdominal pain 02/27/2017  . Lower abdominal pain 02/27/2017  . Esophageal dysphagia 02/27/2017  . Nausea with vomiting 02/27/2017  . Current smoker 08/12/2016  . Genital herpes 04/08/2016  . GERD (gastroesophageal reflux disease) 10/17/2015  . Morbid obesity with BMI of 40.0-44.9, adult  ([place]) 10/17/2015  . ADHD (attention deficit hyperactivity disorder), combined type 07/21/2012  . MDD (major depressive disorder), recurrent episode, moderate ([place]) 04/01/2011  . Oppositional defiant disorder 04/01/2011  . Polysubstance abuse ([place]) 04/01/2011    Past Surgical History:  Procedure Laterality Date  . CHOLECYSTECTOMY  2015  . COSMETIC SURGERY  Age 43   dog bite to face     OB History    Gravida  1   Para      Term      Preterm      AB      Living        SAB      TAB      Ectopic      Multiple      Live Births               Home Medications    Prior to Admission medications   Medication Sig Start Date End Date Taking? Authorizing Provider  pantoprazole (PROTONIX) 40 MG tablet Take 1 tablet (40 mg total) by mouth daily before breakfast. 01/19/18  Yes Mahala Menghini, PA-C  valACYclovir (VALTREX) 500 MG tablet TAKE 1 TABLET BY MOUTH DAILY. 01/21/18  Yes Hawks, Christy A, FNP  HYDROcodone-acetaminophen (NORCO/VICODIN) 5-325 MG tablet Take 1 tablet by mouth every 4 (four) hours as needed. 05/13/18   Evalee Jefferson, PA-C  ondansetron (ZOFRAN ODT) 8 MG disintegrating tablet Take 1 tablet (8 mg total) by mouth  every 8 (eight) hours as needed for nausea or vomiting. 05/13/18   Evalee Jefferson, PA-C    Family History Family History  Problem Relation Age of Onset  . Alcohol abuse Mother   . Stroke Mother   . Bipolar disorder Mother   . Alcohol abuse Father   . Bipolar disorder Father   . Colon cancer Maternal Grandfather   . Crohn's disease Paternal Grandmother   . Crohn's disease Paternal Aunt   . Crohn's disease Paternal Aunt   . Crohn's disease Paternal Uncle   . Colon cancer Paternal Uncle   . Cancer Maternal Aunt        "stomach cancer"  . Celiac disease Neg Hx     Social History Social History   Tobacco Use  . Smoking status: Current Some Day Smoker    Packs/day: 1.00    Years: 5.00    Pack years: 5.00    Types: Cigarettes    Last attempt  to quit: 07/08/2012    Years since quitting: 5.8  . Smokeless tobacco: Never Used  . Tobacco comment: one pack cigarettes daily  Substance Use Topics  . Alcohol use: Yes    Alcohol/week: 0.0 standard drinks    Comment: social  . Drug use: Yes    Types: Marijuana     Allergies   Patient has no known allergies.   Review of Systems Review of Systems  Constitutional: Positive for fever.  HENT: Negative for congestion and sore throat.   Eyes: Negative.   Respiratory: Negative for chest tightness and shortness of breath.   Cardiovascular: Negative for chest pain.  Gastrointestinal: Positive for abdominal pain, nausea and vomiting.  Genitourinary: Negative.   Musculoskeletal: Negative for arthralgias, joint swelling and neck pain.  Skin: Negative.  Negative for rash and wound.  Neurological: Negative for dizziness, weakness, light-headedness, numbness and headaches.  Psychiatric/Behavioral: Negative.      Physical Exam Updated Vital Signs BP 113/66   Pulse 79   Temp 97.9 F (36.6 C) (Oral)   Resp 18   Ht 5\' 5"  (1.651 m)   Wt 103.9 kg   LMP 05/02/2018   SpO2 98%   BMI 38.11 kg/m   Physical Exam Vitals signs and nursing note reviewed.  Constitutional:      General: She is not in acute distress.    Appearance: Normal appearance. She is well-developed.  HENT:     Head: Normocephalic and atraumatic.     Mouth/Throat:     Mouth: Mucous membranes are dry.     Pharynx: No posterior oropharyngeal erythema.  Eyes:     Conjunctiva/sclera: Conjunctivae normal.  Neck:     Musculoskeletal: Normal range of motion.  Cardiovascular:     Rate and Rhythm: Normal rate and regular rhythm.     Heart sounds: Normal heart sounds.  Pulmonary:     Effort: Pulmonary effort is normal.     Breath sounds: Normal breath sounds. No wheezing.  Abdominal:     General: Bowel sounds are normal.     Palpations: Abdomen is soft. There is no mass.     Tenderness: There is abdominal tenderness  in the right lower quadrant. There is guarding. There is no rebound. Positive signs include psoas sign.     Comments: Mild ttp LUQ, no guarding.  Musculoskeletal: Normal range of motion.  Skin:    General: Skin is warm and dry.  Neurological:     Mental Status: She is alert.      ED  Treatments / Results  Labs (all labs ordered are listed, but only abnormal results are displayed) Labs Reviewed  CBC WITH DIFFERENTIAL/PLATELET - Abnormal; Notable for the following components:      Result Value   Neutro Abs 1.6 (*)    All other components within normal limits  URINALYSIS, ROUTINE W REFLEX MICROSCOPIC - Abnormal; Notable for the following components:   Color, Urine AMBER (*)    APPearance CLOUDY (*)    Specific Gravity, Urine 1.032 (*)    Leukocytes, UA SMALL (*)    Bacteria, UA RARE (*)    All other components within normal limits  COMPREHENSIVE METABOLIC PANEL  LIPASE, BLOOD  MONONUCLEOSIS SCREEN  POC URINE PREG, ED    EKG None  Radiology Ct Abdomen Pelvis W Contrast  Result Date: 05/13/2018 CLINICAL DATA:  Nausea for several weeks. Vomiting for 6 days. Abdominal pain for 3 days. Question appendicitis. EXAM: CT ABDOMEN AND PELVIS WITH CONTRAST TECHNIQUE: Multidetector CT imaging of the abdomen and pelvis was performed using the standard protocol following bolus administration of intravenous contrast. CONTRAST:  100 mL ISOVUE-300 IOPAMIDOL (ISOVUE-300) INJECTION 61% COMPARISON:  CT abdomen and pelvis 12/19/2014. FINDINGS: Lower chest: Lung bases are clear. No pleural or pericardial effusion. Hepatobiliary: No focal liver abnormality is seen. Status post cholecystectomy. No biliary dilatation. Pancreas: Unremarkable. No pancreatic ductal dilatation or surrounding inflammatory changes. Spleen: No focal splenic lesion is identified. Splenic volume is calculated at 696 mL compared to 358 mL on the prior examination consistent with new splenomegaly. Upper normal is 411 mL.  Adrenals/Urinary Tract: Adrenal glands are unremarkable. Kidneys are normal, without renal calculi, focal lesion, or hydronephrosis. Bladder is unremarkable. Stomach/Bowel: Stomach is within normal limits. Appendix appears normal. No evidence of bowel wall thickening, distention, or inflammatory changes. Vascular/Lymphatic: No significant vascular findings are present. No enlarged abdominal or pelvic lymph nodes. Reproductive: Uterus and bilateral adnexa are unremarkable. Other: None. Musculoskeletal: No acute bony abnormality is seen. 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 IMPRESSION: Negative for appendicitis or acute abnormality. Splenomegaly as described above is new since the prior CT. Cause is unknown. 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 Electronically Signed   By: Inge Rise M.D.   On: 05/13/2018 11:43    Procedures Procedures (including critical care time)  Medications Ordered in ED Medications  ondansetron (ZOFRAN) injection 4 mg (4 mg Intravenous Given 05/13/18 0953)  morphine 4 MG/ML injection 4 mg (4 mg Intravenous Given 05/13/18 0953)  sodium chloride 0.9 % bolus 1,000 mL ( Intravenous Stopped 05/13/18 1053)  iopamidol (ISOVUE-300) 61 % injection 100 mL (100 mLs Intravenous Contrast Given 05/13/18 1125)  morphine 4 MG/ML injection 4 mg (4 mg Intravenous Given 05/13/18 1206)     Initial Impression / Assessment and Plan / ED Course  I have reviewed the triage vital signs and the nursing notes.  Pertinent labs & imaging results that were available during my care of the patient were reviewed by me and considered in my medical decision making (see chart for details).     Pt with splenomegaly but normal labs including wbc count, LFT's, lipase, mono negative.  Pt will need further eval of this condition as an outpt.  Referral to her pcp for close office f/u.   Discussed return precautions and to avoid exercise, activity increasing risk for falls.  Hydrocodone and zofran prescribed.   Pt discussed with Dr Lita Mains prior to dc home.  Final Clinical Impressions(s) / ED Diagnoses   Final diagnoses:  Splenomegaly  Right lower quadrant abdominal pain    ED Discharge Orders         Ordered    HYDROcodone-acetaminophen (NORCO/VICODIN) 5-325 MG tablet  Every 4 hours PRN     05/13/18 1320    ondansetron (ZOFRAN ODT) 8 MG disintegrating tablet  Every 8 hours PRN     05/13/18 1320           Evalee Jefferson, PA-C 05/13/18 1324    Julianne Rice, MD 05/15/18 1059

## 2018-05-13 NOTE — ED Triage Notes (Signed)
Pt states she was diagnosed with appendicitis by PCP yesterday and was supposed to schedule a CT scan here today but has had continuous vomiting since 0530, pt reports nausea x 6 weeks and vomiting x 6 days

## 2018-05-14 ENCOUNTER — Ambulatory Visit: Payer: Medicaid Other | Admitting: Family

## 2018-05-15 ENCOUNTER — Encounter: Payer: Self-pay | Admitting: Family Medicine

## 2018-05-15 ENCOUNTER — Ambulatory Visit (INDEPENDENT_AMBULATORY_CARE_PROVIDER_SITE_OTHER): Payer: Medicaid Other | Admitting: Family Medicine

## 2018-05-15 VITALS — BP 117/71 | HR 82 | Temp 98.2°F | Ht 65.0 in | Wt 231.0 lb

## 2018-05-15 DIAGNOSIS — R161 Splenomegaly, not elsewhere classified: Secondary | ICD-10-CM | POA: Diagnosis not present

## 2018-05-15 DIAGNOSIS — R748 Abnormal levels of other serum enzymes: Secondary | ICD-10-CM | POA: Diagnosis not present

## 2018-05-15 LAB — CHLAMYDIA/GONOCOCCUS/TRICHOMONAS, NAA
Chlamydia by NAA: NEGATIVE
Gonococcus by NAA: NEGATIVE
Trich vag by NAA: NEGATIVE

## 2018-05-15 NOTE — Progress Notes (Signed)
Subjective:    Patient ID: Colleen Valentine, female    DOB: 1996-02-24, 22 y.o.   MRN: [id number]  Chief Complaint:  ER follow up (still having severe pain, has not been taking Hydrocodone because did not feel it was helping; still having vomiting)   HPI: Colleen Valentine is a 22 y.o. female presenting on 05/15/2018 for ER follow up (still having severe pain, has not been taking Hydrocodone because did not feel it was helping; still having vomiting)   1. Spleen enlarged   Pt presents today for an ED follow up. She was seen om 05/13/18 at [facility] ED for abdominal pain. Pt had a CT scan that revealed an enlarged spleen, all other testing was negative. She denies fever, chills, headache, sore throat, fatigue, unexpected changes in weight, or abnormal bleeding or bruising. She states she continues to have abdominal pain and vomited x 1 this morning.   Relevant past medical, surgical, family, and social history reviewed and updated as indicated.  Allergies and medications reviewed and updated.   Past Medical History:  Diagnosis Date  . Anxiety   . Complication of anesthesia   . Depression   . GERD (gastroesophageal reflux disease)   . Insomnia   . Obesity   . PONV (postoperative nausea and vomiting)   . Seizures ([place])    once in 2016 due to alcohol poisioning  . Suicide [facility])     Past Surgical History:  Procedure Laterality Date  . CHOLECYSTECTOMY  2015  . COSMETIC SURGERY  Age 12   dog bite to face    Social History   Socioeconomic History  . Marital status: Single    Spouse name: Not on file  . Number of children: Not on file  . Years of education: Not on file  . Highest education level: Not on file  Occupational History  . 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 Financial resource strain: Not on file  . Food insecurity:    Worry: Not on file    Inability: Not on file  . Transportation needs:    Medical: Not on file   Non-medical: Not on file  Tobacco Use  . Smoking status: Current Every Day Smoker    Packs/day: 0.25    Years: 5.00    Pack years: 1.25    Types: Cigarettes  . Smokeless tobacco: Never Used  . Tobacco comment: one pack cigarettes daily  Substance and Sexual Activity  . Alcohol use: Yes    Alcohol/week: 0.0 standard drinks    Comment: social  . Drug use: Yes    Types: Marijuana  . Sexual activity: Not Currently    Partners: Male    Birth control/protection: Other-see comments  Lifestyle  . Physical activity:    Days per week: Not on file    Minutes per session: Not on file  . Stress: Not on file  Relationships  . Social connections:    Talks on phone: Not on file    Gets together: Not on file    Attends religious service: Not on file    Active member of club or organization: Not on file    Attends meetings of clubs or organizations: Not on file    Relationship status: Not on file  . Intimate partner violence:    Fear of current or ex partner: Not on file    Emotionally abused: Not on file    Physically abused:  Not on file    Forced sexual activity: Not on file  Other Topics Concern  . Not on file  Social History Narrative   ** Merged History Encounter **        Outpatient Encounter Medications as of 05/15/2018  Medication Sig  . ondansetron (ZOFRAN ODT) 8 MG disintegrating tablet Take 1 tablet (8 mg total) by mouth every 8 (eight) hours as needed for nausea or vomiting.  . pantoprazole (PROTONIX) 40 MG tablet Take 1 tablet (40 mg total) by mouth daily before breakfast.  . valACYclovir (VALTREX) 500 MG tablet TAKE 1 TABLET BY MOUTH DAILY.  Marland Kitchen HYDROcodone-acetaminophen (NORCO/VICODIN) 5-325 MG tablet Take 1 tablet by mouth every 4 (four) hours as needed. (Patient not taking: Reported on 05/15/2018)   No facility-administered encounter medications on file as of 05/15/2018.     No Known Allergies  Review of Systems  Constitutional: Negative for activity change,  chills, fatigue and fever.  HENT: Negative for congestion and sore throat.   Respiratory: Negative for cough, choking and shortness of breath.   Cardiovascular: Negative for chest pain, palpitations and leg swelling.  Gastrointestinal: Positive for abdominal distention, abdominal pain, constipation, nausea and vomiting. Negative for anal bleeding, blood in stool, diarrhea and rectal pain.  Genitourinary: Negative for hematuria and vaginal bleeding.  Musculoskeletal: Negative for myalgias.  Skin: Negative for color change, pallor and rash.  Neurological: Negative for dizziness, syncope and weakness.  Hematological: Negative for adenopathy. Does not bruise/bleed easily.  Psychiatric/Behavioral: Negative for confusion.  All other systems reviewed and are negative.       Objective:    BP 117/71   Pulse 82   Temp 98.2 F (36.8 C) (Oral)   Ht _0  (1.651 m)   Wt 231 lb (104.8 kg)   LMP 05/02/2018   BMI 38.44 kg/m    Wt Readings from Last 3 Encounters:  05/15/18 231 lb (104.8 kg)  05/13/18 229 lb (103.9 kg)  05/12/18 226 lb (102.5 kg)    Physical Exam Vitals signs and nursing note reviewed.  Constitutional:      General: She is in acute distress (mild).     Appearance: Normal appearance. She is obese.  HENT:     Head: Normocephalic and atraumatic.     Mouth/Throat:     Mouth: Mucous membranes are moist.     Pharynx: Oropharynx is clear.  Eyes:     Conjunctiva/sclera: Conjunctivae normal.     Pupils: Pupils are equal, round, and reactive to light.  Neck:     Musculoskeletal: Normal range of motion and neck supple.  Cardiovascular:     Rate and Rhythm: Normal rate and regular rhythm.     Heart sounds: Normal heart sounds. No murmur. No friction rub. No gallop.   Pulmonary:     Effort: Pulmonary effort is normal. No respiratory distress.     Breath sounds: Normal breath sounds.  Abdominal:     General: Abdomen is protuberant. Bowel sounds are normal. There is no  distension.     Palpations: Abdomen is soft.     Tenderness: There is abdominal tenderness in the left upper quadrant. There is guarding.     Comments: Unable to fully palpate spleen due to guarding, pt grabbed providers hand and removed it from abdomen.   Lymphadenopathy:     Head:     Right side of head: No submental, submandibular, tonsillar, preauricular, posterior auricular or occipital adenopathy.     Left side of head: No  submental, submandibular, tonsillar, preauricular, posterior auricular or occipital adenopathy.     Cervical: No cervical adenopathy.     Upper Body:     Right upper body: No supraclavicular or axillary adenopathy.     Left upper body: No supraclavicular or axillary adenopathy.     Lower Body: No right inguinal adenopathy. No left inguinal adenopathy.  Skin:    General: Skin is warm and dry.     Capillary Refill: Capillary refill takes less than 2 seconds.     Coloration: Skin is not jaundiced or pale.     Findings: No bruising or rash.  Neurological:     General: No focal deficit present.     Mental Status: She is alert and oriented to person, place, and time.  Psychiatric:        Mood and Affect: Mood normal.        Behavior: Behavior normal. Behavior is cooperative.        Thought Content: Thought content normal.        Judgment: Judgment normal.     Results for orders placed or performed during the hospital encounter of 05/13/18  CBC with Differential  Result Value Ref Range   WBC 4.1 4.0 - 10.5 K/uL   RBC 4.32 3.87 - 5.11 MIL/uL   Hemoglobin 13.4 12.0 - 15.0 g/dL   HCT 39.8 36.0 - 46.0 %   MCV 92.1 80.0 - 100.0 fL   MCH 31.0 26.0 - 34.0 pg   MCHC 33.7 30.0 - 36.0 g/dL   RDW 12.6 11.5 - 15.5 %   Platelets 158 150 - 400 K/uL   nRBC 0.0 0.0 - 0.2 %   Neutrophils Relative % 39 %   Neutro Abs 1.6 (L) 1.7 - 7.7 K/uL   Lymphocytes Relative 49 %   Lymphs Abs 2.0 0.7 - 4.0 K/uL   Monocytes Relative 9 %   Monocytes Absolute 0.4 0.1 - 1.0 K/uL    Eosinophils Relative 2 %   Eosinophils Absolute 0.1 0.0 - 0.5 K/uL   Basophils Relative 0 %   Basophils Absolute 0.0 0.0 - 0.1 K/uL   Immature Granulocytes 1 %   Abs Immature Granulocytes 0.03 0.00 - 0.07 K/uL   Reactive, Benign Lymphocytes PRESENT   Comprehensive metabolic panel  Result Value Ref Range   Sodium 138 135 - 145 mmol/L   Potassium 3.6 3.5 - 5.1 mmol/L   Chloride 106 98 - 111 mmol/L   CO2 25 22 - 32 mmol/L   Glucose, Bld 83 70 - 99 mg/dL   BUN 12 6 - 20 mg/dL   Creatinine, Ser 0.70 0.44 - 1.00 mg/dL   Calcium 9.0 8.9 - 10.3 mg/dL   Total Protein 7.3 6.5 - 8.1 g/dL   Albumin 4.0 3.5 - 5.0 g/dL   AST 31 15 - 41 U/L   ALT 37 0 - 44 U/L   Alkaline Phosphatase 68 38 - 126 U/L   Total Bilirubin 0.7 0.3 - 1.2 mg/dL   GFR calc non Af Amer >60 >60 mL/min   GFR calc Af Amer >60 >60 mL/min   Anion gap 7 5 - 15  Lipase, blood  Result Value Ref Range   Lipase 28 11 - 51 U/L  Urinalysis, Routine w reflex microscopic  Result Value Ref Range   Color, Urine AMBER (A) YELLOW   APPearance CLOUDY (A) CLEAR   Specific Gravity, Urine 1.032 (H) 1.005 - 1.030   pH 6.0 5.0 - 8.0   Glucose,  UA NEGATIVE NEGATIVE mg/dL   Hgb urine dipstick NEGATIVE NEGATIVE   Bilirubin Urine NEGATIVE NEGATIVE   Ketones, ur NEGATIVE NEGATIVE mg/dL   Protein, ur NEGATIVE NEGATIVE mg/dL   Nitrite NEGATIVE NEGATIVE   Leukocytes, UA SMALL (A) NEGATIVE   RBC / HPF 21-50 0 - 5 RBC/hpf   WBC, UA 6-10 0 - 5 WBC/hpf   Bacteria, UA RARE (A) NONE SEEN   Squamous Epithelial / LPF 21-50 0 - 5   Mucus PRESENT    Ca Oxalate Crys, UA PRESENT   Mononucleosis screen  Result Value Ref Range   Mono Screen NEGATIVE NEGATIVE  POC urine preg, ED (not at [facility])  Result Value Ref Range   Preg Test, Ur NEGATIVE NEGATIVE       Pertinent labs & imaging results that were available during my care of the patient were reviewed by me and considered in my medical decision making.  Assessment & Plan:  Lasya was seen today  for er follow up.  Diagnoses and all orders for this visit:  Spleen enlarged Labs pending. No sports or activities that may result in injury or fall. Report any new or worsening symptoms. Resume Hydrocodone for pain control. PT aware of signs and symptoms that warrant emergent evaluation -     CMP14+EGFR -     CBC with Differential/Platelet -     Epstein-Barr virus VCA antibody panel -     Culture, blood (single) -     Ambulatory referral to Hematology   Continue all other maintenance medications.  Follow up plan: Return in about 1 week (around 05/22/2018), or if symptoms worsen or fail to improve.  Educational handout given for enlarged spleen  The above assessment and management plan was discussed with the patient. The patient verbalized understanding of and has agreed to the management plan. Patient is aware to call the clinic if symptoms persist or worsen. Patient is aware when to return to the clinic for a follow-up visit. Patient educated on when it is appropriate to go to the emergency department.   Monia Pouch, FNP-C [facility] [phone]

## 2018-05-15 NOTE — Patient Instructions (Signed)
Enlarged Spleen    An enlarged spleen (splenomegaly) is when the spleen is larger than normal. This condition is usually noticed when the spleen is almost twice its normal size. The spleen is an organ that is located in the upper left area of the abdomen, just under the ribs. The spleen is like a storage unit for red blood cells, and it also works to filter and clean the blood. It destroys cells that are damaged or worn out. The spleen is also important for fighting disease.  An enlarged spleen is usually a sign of another health problem.  What are the causes?  This condition may be caused by:   Mononucleosis and some other viral infections.   Infection with certain bacteria or parasites.   Liver failure (cirrhosis) and other liver diseases.   Blood diseases, such as hemolytic anemia.   An overactive spleen (hypersplenism).   Blood cancers, such as leukemia or Hodgkin disease.   Metabolic disorders, such as Gaucher disease or Niemann-Pick disease.   Tumors and cysts.   Pressure or blood clots in the veins of the spleen.   Connective tissue disorders, such as lupus or rheumatoid arthritis.  What are the signs or symptoms?  Symptoms of this condition include:   Pain in the upper left part of the abdomen. The pain may spread to the left shoulder or get worse when you take a breath.   Feeling full without eating or after eating only a small amount.   Feeling tired.   Chronic infections.   Bleeding or bruising easily.  In some cases, there are no symptoms.  How is this diagnosed?  This condition may be diagnosed during a physical exam when the health care provider feels the left upper part of your abdomen. You may also have tests, such as:   Blood tests to check red and white blood cells and other proteins and enzymes.   Imaging tests, such as an abdominal ultrasound, CT scan, or MRI.   Taking a tissue sample (biopsy) of the liver or bone marrow if there is concern that it is the cause of an enlarged  spleen.  How is this treated?  Treatment for this condition depends on the cause. Treatment aims to manage the conditions that cause swelling of the spleen and reduce the size of the spleen. Treatment may include:   Medicines to treat infection or disease.   Radiation therapy.   Blood transfusions.   Vaccinations.  If these treatments do not help or if the cause cannot be found, surgery to remove the spleen (splenectomy) may be recommended.  Follow these instructions at home:   Take over-the-counter and prescription medicines only as told by your health care provider.   If you were prescribed an antibiotic medicine, take it as told by your health care provider. Do not stop taking the antibiotic even if you start to feel better.   Follow instructions from your health care provider about limiting your activities. To avoid injury or a ruptured spleen, make sure you:  ? Avoid contact sports.  ? Wear a seat belt in the car.   Keep all follow-up visits as told by your health care provider. This is important.  Contact a health care provider if:   Your symptoms do not improve as expected.   You have a fever or chills.   You feel generally ill.   You have increased pain when you take in a breath.  Get help right away if:     You experience an injury or impact to the spleen area.   Your abdominal pain becomes severe.   You feel dizzy or you faint.   You feel very weak.   You have cold and clammy skin.   You have sweating for no reason.   You have chest pain or difficulty breathing.  This information is not intended to replace advice given to you by your health care provider. Make sure you discuss any questions you have with your health care provider.  Document Released: 10/31/2009 Document Revised: 10/19/2015 Document Reviewed: 10/31/2014  [employer] Interactive Patient Education  2019 [employer]

## 2018-05-16 ENCOUNTER — Other Ambulatory Visit: Payer: Self-pay

## 2018-05-16 ENCOUNTER — Inpatient Hospital Stay (HOSPITAL_COMMUNITY)
Admission: EM | Admit: 2018-05-16 | Discharge: 2018-05-18 | DRG: 392 | Discharge status: Against Medical Advice | Payer: Medicaid Other | Attending: Internal Medicine | Admitting: Internal Medicine

## 2018-05-16 ENCOUNTER — Emergency Department (HOSPITAL_COMMUNITY): Payer: Medicaid Other

## 2018-05-16 ENCOUNTER — Encounter (HOSPITAL_COMMUNITY): Payer: Self-pay

## 2018-05-16 DIAGNOSIS — F902 Attention-deficit hyperactivity disorder, combined type: Secondary | ICD-10-CM | POA: Diagnosis present

## 2018-05-16 DIAGNOSIS — R1110 Vomiting, unspecified: Secondary | ICD-10-CM | POA: Diagnosis not present

## 2018-05-16 DIAGNOSIS — K5900 Constipation, unspecified: Secondary | ICD-10-CM | POA: Diagnosis present

## 2018-05-16 DIAGNOSIS — R109 Unspecified abdominal pain: Secondary | ICD-10-CM | POA: Diagnosis present

## 2018-05-16 DIAGNOSIS — F17210 Nicotine dependence, cigarettes, uncomplicated: Secondary | ICD-10-CM | POA: Diagnosis present

## 2018-05-16 DIAGNOSIS — R161 Splenomegaly, not elsewhere classified: Secondary | ICD-10-CM | POA: Diagnosis present

## 2018-05-16 DIAGNOSIS — Z79899 Other long term (current) drug therapy: Secondary | ICD-10-CM

## 2018-05-16 DIAGNOSIS — R350 Frequency of micturition: Secondary | ICD-10-CM | POA: Diagnosis present

## 2018-05-16 DIAGNOSIS — R1084 Generalized abdominal pain: Principal | ICD-10-CM | POA: Diagnosis present

## 2018-05-16 DIAGNOSIS — Z5329 Procedure and treatment not carried out because of patient's decision for other reasons: Secondary | ICD-10-CM | POA: Diagnosis present

## 2018-05-16 DIAGNOSIS — Z6841 Body Mass Index (BMI) 40.0 and over, adult: Secondary | ICD-10-CM

## 2018-05-16 DIAGNOSIS — F331 Major depressive disorder, recurrent, moderate: Secondary | ICD-10-CM | POA: Diagnosis present

## 2018-05-16 DIAGNOSIS — Z823 Family history of stroke: Secondary | ICD-10-CM

## 2018-05-16 DIAGNOSIS — K219 Gastro-esophageal reflux disease without esophagitis: Secondary | ICD-10-CM | POA: Diagnosis present

## 2018-05-16 DIAGNOSIS — B009 Herpesviral infection, unspecified: Secondary | ICD-10-CM | POA: Diagnosis present

## 2018-05-16 DIAGNOSIS — F1910 Other psychoactive substance abuse, uncomplicated: Secondary | ICD-10-CM | POA: Diagnosis present

## 2018-05-16 DIAGNOSIS — D696 Thrombocytopenia, unspecified: Secondary | ICD-10-CM | POA: Diagnosis present

## 2018-05-16 DIAGNOSIS — N760 Acute vaginitis: Secondary | ICD-10-CM | POA: Diagnosis present

## 2018-05-16 DIAGNOSIS — N12 Tubulo-interstitial nephritis, not specified as acute or chronic: Secondary | ICD-10-CM

## 2018-05-16 DIAGNOSIS — R112 Nausea with vomiting, unspecified: Secondary | ICD-10-CM | POA: Diagnosis present

## 2018-05-16 DIAGNOSIS — G4700 Insomnia, unspecified: Secondary | ICD-10-CM | POA: Diagnosis present

## 2018-05-16 DIAGNOSIS — Z9049 Acquired absence of other specified parts of digestive tract: Secondary | ICD-10-CM

## 2018-05-16 DIAGNOSIS — Z6836 Body mass index (BMI) 36.0-36.9, adult: Secondary | ICD-10-CM

## 2018-05-16 DIAGNOSIS — B3749 Other urogenital candidiasis: Secondary | ICD-10-CM | POA: Diagnosis present

## 2018-05-16 DIAGNOSIS — Z5321 Procedure and treatment not carried out due to patient leaving prior to being seen by health care provider: Secondary | ICD-10-CM | POA: Diagnosis not present

## 2018-05-16 DIAGNOSIS — Z800 Family history of malignant neoplasm of digestive organs: Secondary | ICD-10-CM

## 2018-05-16 LAB — COMPREHENSIVE METABOLIC PANEL
ALT: 64 U/L — ABNORMAL HIGH (ref 0–44)
AST: 52 U/L — ABNORMAL HIGH (ref 15–41)
Albumin: 3.4 g/dL — ABNORMAL LOW (ref 3.5–5.0)
Alkaline Phosphatase: 67 U/L (ref 38–126)
Anion gap: 8 (ref 5–15)
BUN: 5 mg/dL — ABNORMAL LOW (ref 6–20)
CO2: 27 mmol/L (ref 22–32)
Calcium: 9.1 mg/dL (ref 8.9–10.3)
Chloride: 106 mmol/L (ref 98–111)
Creatinine, Ser: 0.82 mg/dL (ref 0.44–1.00)
GFR calc Af Amer: >60 mL/min (ref >60)
GFR calc non Af Amer: >60 mL/min (ref >60)
Glucose, Bld: 102 mg/dL — ABNORMAL HIGH (ref 70–99)
Potassium: 3.5 mmol/L (ref 3.5–5.1)
Sodium: 141 mmol/L (ref 135–145)
Total Bilirubin: 0.8 mg/dL (ref 0.3–1.2)
Total Protein: 6.5 g/dL (ref 6.5–8.1)

## 2018-05-16 LAB — URINALYSIS, ROUTINE W REFLEX MICROSCOPIC
Glucose, UA: NEGATIVE mg/dL
Hgb urine dipstick: NEGATIVE
Ketones, ur: 15 mg/dL — AB
Nitrite: POSITIVE — AB
Protein, ur: NEGATIVE mg/dL
Specific Gravity, Urine: 1.025 (ref 1.005–1.030)
pH: 6.5 (ref 5.0–8.0)

## 2018-05-16 LAB — I-STAT CG4 LACTIC ACID, ED
Lactic Acid, Venous: 0.95 mmol/L (ref 0.5–1.9)
Lactic Acid, Venous: 0.75 mmol/L (ref 0.5–1.9)

## 2018-05-16 LAB — CBC WITH DIFFERENTIAL/PLATELET
Abs Immature Granulocytes: 0.05 10*3/uL (ref 0.00–0.07)
Basophils Absolute: 0 10*3/uL (ref 0.0–0.1)
Basophils Relative: 1 %
Eosinophils Absolute: 0.1 10*3/uL (ref 0.0–0.5)
Eosinophils Relative: 2 %
HCT: 38.4 % (ref 36.0–46.0)
Hemoglobin: 12.4 g/dL (ref 12.0–15.0)
Immature Granulocytes: 1 %
Lymphocytes Relative: 45 %
Lymphs Abs: 2.1 10*3/uL (ref 0.7–4.0)
MCH: 29.9 pg (ref 26.0–34.0)
MCHC: 32.3 g/dL (ref 30.0–36.0)
MCV: 92.5 fL (ref 80.0–100.0)
Monocytes Absolute: 0.3 10*3/uL (ref 0.1–1.0)
Monocytes Relative: 6 %
Neutro Abs: 2.1 10*3/uL (ref 1.7–7.7)
Neutrophils Relative %: 45 %
Platelets: 146 10*3/uL — ABNORMAL LOW (ref 150–400)
RBC: 4.15 MIL/uL (ref 3.87–5.11)
RDW: 13.1 % (ref 11.5–15.5)
WBC: 4.6 10*3/uL (ref 4.0–10.5)
nRBC: 0 % (ref 0.0–0.2)

## 2018-05-16 LAB — URINALYSIS, MICROSCOPIC (REFLEX)

## 2018-05-16 LAB — I-STAT BETA HCG BLOOD, ED (MC, WL, AP ONLY): I-stat hCG, quantitative: <5 m[IU]/mL (ref <5)

## 2018-05-16 LAB — LIPASE, BLOOD: Lipase: 26 U/L (ref 11–51)

## 2018-05-16 MED ORDER — CLOTRIMAZOLE 2 % VA CREA
1.0000 | TOPICAL_CREAM | Freq: Every day | VAGINAL | Status: DC
  Administered 2018-05-17 (×2): 1 via VAGINAL
  Filled 2018-05-16: qty 21

## 2018-05-16 MED ORDER — SODIUM CHLORIDE 0.9 % IV SOLN
1.0000 g | Freq: Once | INTRAVENOUS | Status: AC
  Administered 2018-05-17: 1 g via INTRAVENOUS
  Filled 2018-05-16: qty 10

## 2018-05-16 MED ORDER — MORPHINE SULFATE (PF) 4 MG/ML IV SOLN
4.0000 mg | Freq: Once | INTRAVENOUS | Status: DC
  Filled 2018-05-16: qty 1

## 2018-05-16 MED ORDER — IOPAMIDOL (ISOVUE-370) INJECTION 76%
100.0000 mL | Freq: Once | INTRAVENOUS | Status: AC | PRN
  Administered 2018-05-16: 100 mL via INTRAVENOUS

## 2018-05-16 MED ORDER — MORPHINE SULFATE (PF) 4 MG/ML IV SOLN
4.0000 mg | Freq: Once | INTRAVENOUS | Status: AC
  Administered 2018-05-16: 4 mg via INTRAVENOUS
  Filled 2018-05-16: qty 1

## 2018-05-16 MED ORDER — ONDANSETRON HCL 4 MG/2ML IJ SOLN
4.0000 mg | Freq: Once | INTRAMUSCULAR | Status: AC
  Administered 2018-05-16: 4 mg via INTRAVENOUS
  Filled 2018-05-16: qty 2

## 2018-05-16 MED ORDER — FENTANYL CITRATE (PF) 100 MCG/2ML IJ SOLN
50.0000 ug | Freq: Once | INTRAMUSCULAR | Status: AC
  Administered 2018-05-16: 50 ug via INTRAVENOUS
  Filled 2018-05-16: qty 2

## 2018-05-16 MED ORDER — HYDROMORPHONE HCL 1 MG/ML IJ SOLN
1.0000 mg | Freq: Once | INTRAMUSCULAR | Status: AC
  Administered 2018-05-16: 1 mg via INTRAVENOUS
  Filled 2018-05-16: qty 1

## 2018-05-16 MED ORDER — PROMETHAZINE HCL 25 MG/ML IJ SOLN
12.5000 mg | Freq: Once | INTRAMUSCULAR | Status: AC
  Administered 2018-05-16: 12.5 mg via INTRAVENOUS
  Filled 2018-05-16: qty 1

## 2018-05-16 NOTE — ED Provider Notes (Signed)
[place] EMERGENCY DEPARTMENT Provider Note   CSN: [id number] Arrival date & time: 05/16/18  1904   History   Chief Complaint Chief Complaint  Patient presents with  . Abdominal Pain    HPI Colleen Valentine is a 22 y.o. female with a hx of tobacco abuse, anxiety, depression, seizures, insomnia, seizures, & polysubstance abuse who presents to the ED with complaints of acutely worsened abdominal pain today. Patient states she has had 3 weeks worth of abdominal pain, mostly to LUQ but does radiate to generalized abdomen. This has been associated with nausea, emesis, and constipation- last BM 10 days prior, not passing gas. She was evaluated at [facility] emergency department for these sxs 05/13/18- labs overall reassuring, CT abdomen pelvis revealed splenomegaly with unknown etiology. She was discharged with plan for PCP follow up- given hydrocodone for pain & zofran for nausea/vomiting. States zofran has been helping, will have some break through vomiting, but has been able to tolerate PO. She states hydrocodone is not really help the pain, just making her sleepy. Seen by PCP yesterday for this and had labs performed w/ plan for follow up 12/30. Today pain seemed to acutely worsen in severity prompting ER visit. Current pain is a 10/10, worse with coughing, breathing, or movement. No alleviating factors. Denies fever, chills, or URI sxs.   She also mentioned she has had vaginal discharge & urinary frequency for several months, seen by obgyn- diagnosed w/ BV & yeast in the past, has been on flagyl orally and intra-vaginally without relief & has had tx for yeast in the past without relief as well. She states she does not wish to have another pelvic exam today during our initial discussion. She states she is sexually active in a monogamous relationship, no concern for STDs.   HPI  Past Medical History:  Diagnosis Date  . Anxiety   . Complication of anesthesia   . Depression   .  GERD (gastroesophageal reflux disease)   . Insomnia   . Obesity   . PONV (postoperative nausea and vomiting)   . Seizures ([place])    once in 2016 due to alcohol poisioning  . Suicide [facility])     Patient Active Problem List   Diagnosis Date Noted  . Diarrhea 02/27/2017  . Rectal bleeding 02/27/2017  . Abdominal pain 02/27/2017  . Lower abdominal pain 02/27/2017  . Esophageal dysphagia 02/27/2017  . Nausea with vomiting 02/27/2017  . Current smoker 08/12/2016  . Genital herpes 04/08/2016  . GERD (gastroesophageal reflux disease) 10/17/2015  . Morbid obesity with BMI of 40.0-44.9, adult ([place]) 10/17/2015  . ADHD (attention deficit hyperactivity disorder), combined type 07/21/2012  . MDD (major depressive disorder), recurrent episode, moderate ([place]) 04/01/2011  . Oppositional defiant disorder 04/01/2011  . Polysubstance abuse ([place]) 04/01/2011    Past Surgical History:  Procedure Laterality Date  . CHOLECYSTECTOMY  2015  . COSMETIC SURGERY  Age 33   dog bite to face     OB History    Gravida  1   Para      Term      Preterm      AB      Living        SAB      TAB      Ectopic      Multiple      Live Births               Home Medications    Prior to  Admission medications   Medication Sig Start Date End Date Taking? Authorizing Provider  HYDROcodone-acetaminophen (NORCO/VICODIN) 5-325 MG tablet Take 1 tablet by mouth every 4 (four) hours as needed. Patient not taking: Reported on 05/15/2018 05/13/18   Evalee Jefferson, PA-C  ondansetron (ZOFRAN ODT) 8 MG disintegrating tablet Take 1 tablet (8 mg total) by mouth every 8 (eight) hours as needed for nausea or vomiting. 05/13/18   Idol, Almyra Free, PA-C  pantoprazole (PROTONIX) 40 MG tablet Take 1 tablet (40 mg total) by mouth daily before breakfast. 01/19/18   Mahala Menghini, PA-C  valACYclovir (VALTREX) 500 MG tablet TAKE 1 TABLET BY MOUTH DAILY. 01/21/18   Sharion Balloon, FNP    Family History Family History    Problem Relation Age of Onset  . Alcohol abuse Mother   . Stroke Mother   . Bipolar disorder Mother   . Alcohol abuse Father   . Bipolar disorder Father   . Colon cancer Maternal Grandfather   . Crohn's disease Paternal Grandmother   . Crohn's disease Paternal Aunt   . Crohn's disease Paternal Aunt   . Crohn's disease Paternal Uncle   . Colon cancer Paternal Uncle   . Cancer Maternal Aunt        "stomach cancer"  . Celiac disease Neg Hx     Social History Social History   Tobacco Use  . Smoking status: Current Every Day Smoker    Packs/day: 0.50    Years: 5.00    Pack years: 2.50    Types: Cigarettes  . Smokeless tobacco: Never Used  . Tobacco comment: one pack cigarettes daily  Substance Use Topics  . Alcohol use: Yes    Alcohol/week: 0.0 standard drinks    Comment: social  . Drug use: Yes    Types: Marijuana     Allergies   Patient has no known allergies.   Review of Systems Review of Systems  Constitutional: Negative for chills and fever.  HENT: Negative for congestion, ear pain, sore throat and voice change.   Gastrointestinal: Positive for abdominal pain, constipation, nausea and vomiting. Negative for blood in stool and diarrhea.  Genitourinary: Positive for frequency and vaginal discharge. Negative for vaginal bleeding.  All other systems reviewed and are negative.    Physical Exam Updated Vital Signs BP 105/77 (BP Location: Right Arm)   Pulse 95   Temp 98.2 F (36.8 C) (Oral)   Resp 17   Ht 5\' 5"  (1.651 m)   Wt 98.9 kg   LMP 05/02/2018   SpO2 99%   BMI 36.28 kg/m   Physical Exam Vitals signs and nursing note reviewed.  Constitutional:      General: She is in acute distress (appears uncomfortable).     Appearance: She is well-developed. She is not toxic-appearing.  HENT:     Head: Normocephalic and atraumatic.  Eyes:     General:        Right eye: No discharge.        Left eye: No discharge.     Conjunctiva/sclera: Conjunctivae  normal.  Neck:     Musculoskeletal: Neck supple.  Cardiovascular:     Rate and Rhythm: Normal rate and regular rhythm.  Pulmonary:     Effort: Pulmonary effort is normal. No respiratory distress.     Breath sounds: Normal breath sounds. No wheezing, rhonchi or rales.  Abdominal:     General: There is no distension.     Palpations: Abdomen is soft. There is splenomegaly. There is  no hepatomegaly.     Tenderness: There is generalized abdominal tenderness (generalized, most prominent to LUQ). There is guarding (mild LUQ).  Genitourinary:    Comments: Patient refused pelvic exam.  Skin:    General: Skin is warm and dry.     Findings: No rash.  Neurological:     Mental Status: She is alert.     Comments: Clear speech.   Psychiatric:        Behavior: Behavior normal.    ED Treatments / Results  Labs (all labs ordered are listed, but only abnormal results are displayed) Labs Reviewed  CBC WITH DIFFERENTIAL/PLATELET - Abnormal; Notable for the following components:      Result Value   Platelets 146 (*)    All other components within normal limits  COMPREHENSIVE METABOLIC PANEL - Abnormal; Notable for the following components:   Glucose, Bld 102 (*)    BUN 5 (*)    Albumin 3.4 (*)    AST 52 (*)    ALT 64 (*)    All other components within normal limits  URINALYSIS, ROUTINE W REFLEX MICROSCOPIC - Abnormal; Notable for the following components:   Color, Urine AMBER (*)    APPearance CLOUDY (*)    Bilirubin Urine MODERATE (*)    Ketones, ur 15 (*)    Nitrite POSITIVE (*)    Leukocytes, UA MODERATE (*)    All other components within normal limits  URINALYSIS, MICROSCOPIC (REFLEX) - Abnormal; Notable for the following components:   Bacteria, UA MANY (*)    All other components within normal limits  WET PREP, GENITAL  LIPASE, BLOOD  RPR  HIV ANTIBODY (ROUTINE TESTING W REFLEX)  I-STAT BETA HCG BLOOD, ED ([facility], [facility], [facility] ONLY)  I-STAT CG4 LACTIC ACID, ED  I-STAT CG4 LACTIC ACID, ED   GC/CHLAMYDIA PROBE AMP ([place]) NOT AT [facility]    EKG None  Radiology Ct Abdomen Pelvis W Contrast  Result Date: 05/16/2018 CLINICAL DATA:  Left upper quadrant pain for 3 weeks with diagnosis of splenomegaly. EXAM: CT ABDOMEN AND PELVIS WITH CONTRAST TECHNIQUE: Multidetector CT imaging of the abdomen and pelvis was performed using the standard protocol following bolus administration of intravenous contrast. CONTRAST:  145mL ISOVUE-370 IOPAMIDOL (ISOVUE-370) INJECTION 76% COMPARISON:  Two days ago. FINDINGS: Lower chest: Clear lung bases. Normal heart size without pericardial or pleural effusion. Hepatobiliary: Normal liver. Cholecystectomy, without biliary ductal dilatation. Pancreas: Normal, without mass or ductal dilatation. Spleen: Splenomegaly, including at 12.4 cm craniocaudal, 16.9 x 8.9 cm transverse. (volume = 980 cc). Grossly similar to the recent study. No evidence of focal splenic lesion or splenic rupture/infarct. Adrenals/Urinary Tract: Normal adrenal glands. Normal kidneys, without hydronephrosis. Decompressed urinary bladder. Stomach/Bowel: Gastric antral underdistention. Normal colon, appendix, and terminal ileum. Normal small bowel. Vascular/Lymphatic: Normal caliber of the aorta and branch vessels. Small abdominal retroperitoneal nodes are not pathologic by size criteria. No pelvic sidewall adenopathy. Reproductive: Normal uterus and adnexa. Other: Trace free pelvic fluid is likely physiologic. 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 IMPRESSION: 1. No acute process in the abdomen or pelvis. 2. Similar splenomegaly, without specific evidence of splenic lesion or infarct. 3. 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 Electronically Signed   By: Abigail Miyamoto M.D.   On: 05/16/2018 23:42    Procedures Procedures (including critical care time)  Medications Ordered in ED Medications - No data to display   Initial Impression / Assessment and  Plan / ED Course  I have reviewed the triage  vital signs and the nursing notes.  Pertinent labs & imaging results that were available during my care of the patient were reviewed by me and considered in my medical decision making (see chart for details).   Patient presents to the ED with acutely worsened abdominal pain w/ nausea/vomiting. Recently identified splenomegaly on CT imaging 12/18. Patient nontoxic, appears uncomfortable, vitals WNL. Exam with generalized tenderness, most prominent in LUQ w/ mild guarding noted.   Work up reviewed: no leukocytosis or anemia. Mild thrombocytopenia fairly similar to prior. LFTs w/ minimal elevation. Lipase a& renal function WNL. Lactic acid normal. CT scan with stable splenomegaly, no acute findings.   Urinalysis appears consistent with infection, she also has been noted to have yeast on her UA. She has had continuous pain & vomiting w/ multiple rounds of pain medicine as well as zofran & phenergan. Feel patient will likely require admission for what I suspect is pyelonephritis, rocephin being administered. I discussed with patient that pelvic exam should really be performed to further assess current presentation, especially prior to admission, she was reluctantly agreeable. Pelvic exam performed w/ chaperone present. There is mild erythema to the labia & vaginal introitus, no appreciable vesicular lesions, no fluctuance or abscess. Labia & vaginal introitus are tender to palpation. There is white thick discharge noted at introitus. Patient refused speculum exam secondary to pain at the introitus not due to pelvic pain. Brief bimanual without obvious focal tenderness. Low suspicion for PID w/ hx and patient lack of concern for STDs, also this has been an ongoing issue and she has had negative STD tests in the past- most recently 12/17 without new sexual partners. Intravaginal tx for yeast administered. Wet prep and STD cultures pending @ time of admission.      00:07: CONSULT: Discussed with hospitalist Dr. Jonelle Sidle who accepts admission.   Findings and plan of care discussed with supervising physician Dr. Sedonia Small who personally evaluated and examined this patient and is in agreement.   Final Clinical Impressions(s) / ED Diagnoses   Final diagnoses:  Pyelonephritis    ED Discharge Orders    None       Amaryllis Dyke, PA-C 05/17/18 0035    Maudie Flakes, MD 05/18/18 [phone]

## 2018-05-16 NOTE — ED Triage Notes (Signed)
Pt from home w/ a c/o LUQ abdominal pain x3 weeks w/ a dx of splenomegaly. She was dx at [facility] 2 days ago who told her to follow up w/ her PCP. PCP stated that she has to wait for her blood work to come in before they can proceed. They advised her to seek ED medical attention if the pain became worse so she can have a splenectomy. Constant N/V (3 weeks) and constipation (10 days). Pain is constant, sharp, and radiates to RLQ. Tender to palpation over LUQ.   Additional complaints of BV for 3 months and would like to be evaluated for that as well.

## 2018-05-17 DIAGNOSIS — Z6836 Body mass index (BMI) 36.0-36.9, adult: Secondary | ICD-10-CM | POA: Diagnosis not present

## 2018-05-17 DIAGNOSIS — F902 Attention-deficit hyperactivity disorder, combined type: Secondary | ICD-10-CM

## 2018-05-17 DIAGNOSIS — R161 Splenomegaly, not elsewhere classified: Secondary | ICD-10-CM | POA: Diagnosis present

## 2018-05-17 DIAGNOSIS — Z6841 Body Mass Index (BMI) 40.0 and over, adult: Secondary | ICD-10-CM | POA: Diagnosis not present

## 2018-05-17 DIAGNOSIS — F17210 Nicotine dependence, cigarettes, uncomplicated: Secondary | ICD-10-CM | POA: Diagnosis present

## 2018-05-17 DIAGNOSIS — N10 Acute tubulo-interstitial nephritis: Secondary | ICD-10-CM | POA: Insufficient documentation

## 2018-05-17 DIAGNOSIS — F331 Major depressive disorder, recurrent, moderate: Secondary | ICD-10-CM | POA: Diagnosis not present

## 2018-05-17 DIAGNOSIS — K5900 Constipation, unspecified: Secondary | ICD-10-CM | POA: Diagnosis present

## 2018-05-17 DIAGNOSIS — D696 Thrombocytopenia, unspecified: Secondary | ICD-10-CM | POA: Diagnosis present

## 2018-05-17 DIAGNOSIS — Z9049 Acquired absence of other specified parts of digestive tract: Secondary | ICD-10-CM | POA: Diagnosis not present

## 2018-05-17 DIAGNOSIS — R350 Frequency of micturition: Secondary | ICD-10-CM | POA: Diagnosis present

## 2018-05-17 DIAGNOSIS — N760 Acute vaginitis: Secondary | ICD-10-CM | POA: Diagnosis present

## 2018-05-17 DIAGNOSIS — G4700 Insomnia, unspecified: Secondary | ICD-10-CM | POA: Diagnosis present

## 2018-05-17 DIAGNOSIS — B3749 Other urogenital candidiasis: Secondary | ICD-10-CM | POA: Diagnosis present

## 2018-05-17 DIAGNOSIS — Z823 Family history of stroke: Secondary | ICD-10-CM | POA: Diagnosis not present

## 2018-05-17 DIAGNOSIS — K219 Gastro-esophageal reflux disease without esophagitis: Secondary | ICD-10-CM | POA: Diagnosis present

## 2018-05-17 DIAGNOSIS — B009 Herpesviral infection, unspecified: Secondary | ICD-10-CM | POA: Diagnosis present

## 2018-05-17 DIAGNOSIS — R1084 Generalized abdominal pain: Secondary | ICD-10-CM | POA: Diagnosis present

## 2018-05-17 DIAGNOSIS — Z79899 Other long term (current) drug therapy: Secondary | ICD-10-CM | POA: Diagnosis not present

## 2018-05-17 DIAGNOSIS — Z5329 Procedure and treatment not carried out because of patient's decision for other reasons: Secondary | ICD-10-CM | POA: Diagnosis present

## 2018-05-17 DIAGNOSIS — R112 Nausea with vomiting, unspecified: Secondary | ICD-10-CM | POA: Diagnosis present

## 2018-05-17 DIAGNOSIS — Z800 Family history of malignant neoplasm of digestive organs: Secondary | ICD-10-CM | POA: Diagnosis not present

## 2018-05-17 HISTORY — DX: Acute pyelonephritis: N10

## 2018-05-17 LAB — COMPREHENSIVE METABOLIC PANEL
ALT: 60 U/L — ABNORMAL HIGH (ref 0–44)
AST: 47 U/L — ABNORMAL HIGH (ref 15–41)
Albumin: 3 g/dL — ABNORMAL LOW (ref 3.5–5.0)
Alkaline Phosphatase: 58 U/L (ref 38–126)
Anion gap: 8 (ref 5–15)
BUN: 5 mg/dL — ABNORMAL LOW (ref 6–20)
CO2: 28 mmol/L (ref 22–32)
Calcium: 8.4 mg/dL — ABNORMAL LOW (ref 8.9–10.3)
Chloride: 105 mmol/L (ref 98–111)
Creatinine, Ser: 0.84 mg/dL (ref 0.44–1.00)
GFR calc Af Amer: >60 mL/min (ref >60)
GFR calc non Af Amer: >60 mL/min (ref >60)
Glucose, Bld: 104 mg/dL — ABNORMAL HIGH (ref 70–99)
Potassium: 3.4 mmol/L — ABNORMAL LOW (ref 3.5–5.1)
Sodium: 141 mmol/L (ref 135–145)
Total Bilirubin: 0.8 mg/dL (ref 0.3–1.2)
Total Protein: 5.7 g/dL — ABNORMAL LOW (ref 6.5–8.1)

## 2018-05-17 LAB — CBC
HCT: 34.9 % — ABNORMAL LOW (ref 36.0–46.0)
Hemoglobin: 11.1 g/dL — ABNORMAL LOW (ref 12.0–15.0)
MCH: 29.8 pg (ref 26.0–34.0)
MCHC: 31.8 g/dL (ref 30.0–36.0)
MCV: 93.8 fL (ref 80.0–100.0)
Platelets: 130 10*3/uL — ABNORMAL LOW (ref 150–400)
RBC: 3.72 MIL/uL — ABNORMAL LOW (ref 3.87–5.11)
RDW: 13.3 % (ref 11.5–15.5)
WBC: 3.1 10*3/uL — ABNORMAL LOW (ref 4.0–10.5)
nRBC: 0 % (ref 0.0–0.2)

## 2018-05-17 LAB — HIV ANTIBODY (ROUTINE TESTING W REFLEX): HIV Screen 4th Generation wRfx: NONREACTIVE

## 2018-05-17 LAB — WET PREP, GENITAL
Sperm: NONE SEEN
Trich, Wet Prep: NONE SEEN

## 2018-05-17 MED ORDER — VALACYCLOVIR HCL 500 MG PO TABS
500.0000 mg | ORAL_TABLET | Freq: Every day | ORAL | Status: DC
  Administered 2018-05-17 – 2018-05-18 (×2): 500 mg via ORAL
  Filled 2018-05-17 (×3): qty 1

## 2018-05-17 MED ORDER — MAGNESIUM CITRATE PO SOLN
1.0000 | Freq: Once | ORAL | Status: AC
  Administered 2018-05-17: 1 via ORAL
  Filled 2018-05-17: qty 296

## 2018-05-17 MED ORDER — CLINDAMYCIN HCL 300 MG PO CAPS
300.0000 mg | ORAL_CAPSULE | Freq: Four times a day (QID) | ORAL | Status: DC
  Administered 2018-05-17 – 2018-05-18 (×4): 300 mg via ORAL
  Filled 2018-05-17 (×4): qty 1

## 2018-05-17 MED ORDER — KETOROLAC TROMETHAMINE 30 MG/ML IJ SOLN
30.0000 mg | Freq: Four times a day (QID) | INTRAMUSCULAR | Status: DC | PRN
  Administered 2018-05-17 – 2018-05-18 (×4): 30 mg via INTRAVENOUS
  Filled 2018-05-17 (×4): qty 1

## 2018-05-17 MED ORDER — ONDANSETRON 4 MG PO TBDP: 8.0000 mg | ORAL_TABLET | Freq: Three times a day (TID) | ORAL | Status: DC | PRN

## 2018-05-17 MED ORDER — METRONIDAZOLE 500 MG PO TABS
500.0000 mg | ORAL_TABLET | Freq: Three times a day (TID) | ORAL | Status: DC
  Filled 2018-05-17: qty 1

## 2018-05-17 MED ORDER — SODIUM CHLORIDE 0.9 % IV SOLN: 1.0000 g | INTRAVENOUS | Status: DC

## 2018-05-17 MED ORDER — ONDANSETRON HCL 4 MG/2ML IJ SOLN
4.0000 mg | Freq: Four times a day (QID) | INTRAMUSCULAR | Status: DC | PRN
  Administered 2018-05-17 (×4): 4 mg via INTRAVENOUS
  Filled 2018-05-17 (×4): qty 2

## 2018-05-17 MED ORDER — ONDANSETRON HCL 4 MG PO TABS
4.0000 mg | ORAL_TABLET | Freq: Four times a day (QID) | ORAL | Status: DC | PRN
  Filled 2018-05-17: qty 1

## 2018-05-17 MED ORDER — HYDROMORPHONE HCL 1 MG/ML IJ SOLN
0.5000 mg | Freq: Once | INTRAMUSCULAR | Status: AC
  Administered 2018-05-17: 0.5 mg via INTRAVENOUS
  Filled 2018-05-17: qty 1

## 2018-05-17 MED ORDER — FLUCONAZOLE 150 MG PO TABS
150.0000 mg | ORAL_TABLET | Freq: Once | ORAL | Status: AC
  Administered 2018-05-17: 150 mg via ORAL
  Filled 2018-05-17: qty 1

## 2018-05-17 MED ORDER — KETOROLAC TROMETHAMINE 15 MG/ML IJ SOLN
15.0000 mg | Freq: Four times a day (QID) | INTRAMUSCULAR | Status: DC | PRN
  Administered 2018-05-17: 15 mg via INTRAVENOUS
  Filled 2018-05-17: qty 1

## 2018-05-17 MED ORDER — PANTOPRAZOLE SODIUM 40 MG PO TBEC
40.0000 mg | DELAYED_RELEASE_TABLET | Freq: Every day | ORAL | Status: DC
  Administered 2018-05-17 – 2018-05-18 (×2): 40 mg via ORAL
  Filled 2018-05-17 (×2): qty 1

## 2018-05-17 MED ORDER — DEXTROSE-NACL 5-0.9 % IV SOLN
INTRAVENOUS | Status: DC
  Administered 2018-05-17 – 2018-05-18 (×4): via INTRAVENOUS

## 2018-05-17 MED ORDER — CALCIUM CARBONATE ANTACID 500 MG PO CHEW
1.0000 | CHEWABLE_TABLET | Freq: Three times a day (TID) | ORAL | Status: DC | PRN
  Administered 2018-05-17: 200 mg via ORAL
  Filled 2018-05-17: qty 1

## 2018-05-17 NOTE — Addendum Note (Signed)
Addended by: Baruch Gouty on: 05/17/2018 11:50 AM   Modules accepted: Orders

## 2018-05-17 NOTE — Progress Notes (Signed)
PROGRESS NOTE    Colleen Valentine  RKY:[id number] DOB: 1995/08/15 DOA: 05/16/2018 PCP: Sharion Balloon, FNP    Brief Narrative:  22 y.o. female with medical history significant of ADHD, seizure disorder, insomnia morbid obesity, polysubstance abuse, tobacco abuse, recent diagnosis of splenomegaly, GERD who was at any pain only yesterday with nausea vomiting abdominal pain.  Diagnosed with splenomegaly and no UTI.  Pain at that time was localized to the right lower abdomen.  Suspected appendicitis.  Patient was scheduled for test in the regard.  No dysuria but has some fever.  She went home but symptoms get worse and came here today.  In the ER today patient has continuous nausea with vomiting.  Patient also reported vaginal discharge.  She was seen and examined and work-up here suggested acute PID with vaginal yeast infection.  Patient is being admitted therefore for treatment.  ED Course: Temperature is 98 to blood pressure is 105/77 pulse 102 respiratory rate of 17 oxygen sat 88% room air.  Patient's CBC and chemistry all appear to be within normal.  Lactic acid is 0.75.  Pelvic exam was tender with visible vaginal yeast infection.  CT abdomen pelvis showed no acute process.  Urinalysis showed positive nitrite many bacteria with yeast urea calcium oxalate crystals WBC 6-10 RBC 6-10 with cloudy urine.  Patient is therefore being admitted with possible acute pyelonephritis.  Assessment & Plan:   Principal Problem:   Acute pyelonephritis Active Problems:   MDD (major depressive disorder), recurrent episode, moderate ([facility])   Polysubstance abuse ([facility])   ADHD (attention deficit hyperactivity disorder), combined type   GERD (gastroesophageal reflux disease)   Morbid obesity with BMI of 40.0-44.9, adult ([place])  #1 acute pyelonephritis:  -Not classic for typical [place].   -CT reviewed, kidneys appear normal -Follow urine culture.  #2 intractable nausea with vomiting:  -Images on CT abd/pelvis  personally reviewed. Findings of stool throughout colon noted -Patient has reported no bowel movement over past 10 days -Will give trial of mg citrate  #3 ADHD: Continue with home regimen of medications as tolerated  #4 GERD: Continue with PPIs as tolerated  #5 morbid obesity: Recommend diet/lifestyle modification  #6 tobacco abuse: Tobacco cessation counseling with nicotine patch to be ordered.  #7 bacterial vaginosis: reportedly failed course of flagyl. Clue cells as well as yeast on wet prep. Give trial of clindamycin, anticipate 7 days of tx. Yeast also noted. Will give trial of fluconazole 150mg  x 1 dose  DVT prophylaxis: SCD's Code Status: Full Family Communication: Pt in room, family at bedside Disposition Plan: Uncertain at this time  Consultants:     Procedures:     Antimicrobials: Anti-infectives (From admission, onward)   Start     Dose/Rate Route Frequency Ordered Stop   05/17/18 2200  cefTRIAXone (ROCEPHIN) 1 g in sodium chloride 0.9 % 100 mL IVPB  Status:  Discontinued     1 g 200 mL/hr over 30 Minutes Intravenous Every 24 hours 05/17/18 0053 05/17/18 1148   05/17/18 1800  clindamycin (CLEOCIN) capsule 300 mg     300 mg Oral Every 6 hours 05/17/18 1451     05/17/18 1400  metroNIDAZOLE (FLAGYL) tablet 500 mg  Status:  Discontinued     500 mg Oral Every 8 hours 05/17/18 1148 05/17/18 1451   05/17/18 1000  valACYclovir (VALTREX) tablet 500 mg     500 mg Oral Daily 05/17/18 0053     05/16/18 2345  cefTRIAXone (ROCEPHIN) 1 g in sodium chloride 0.9 %  100 mL IVPB     1 g 200 mL/hr over 30 Minutes Intravenous  Once 05/16/18 2345 05/17/18 0058       Subjective: Complaining of abd pain  Objective: Vitals:   05/16/18 2130 05/17/18 0055 05/17/18 0634 05/17/18 1611  BP: 122/65 111/67 103/75 (!) 103/52  Pulse: 93 75 83 90  Resp:  19 19 17   Temp:  98.1 F (36.7 C) 98 F (36.7 C) 98.2 F (36.8 C)  TempSrc:  Oral Oral Oral  SpO2: 97% 100% 97% 100%    Weight:  98.9 kg    Height:  5\' 5"  (1.651 m)      Intake/Output Summary (Last 24 hours) at 05/17/2018 1830 Last data filed at 05/17/2018 1751 Gross per 24 hour  Intake 2277.35 ml  Output 150 ml  Net 2127.35 ml   Filed Weights   05/16/18 1924 05/17/18 0055  Weight: 98.9 kg 98.9 kg    Examination:  General exam: Appears calm and comfortable  Respiratory system: Clear to auscultation. Respiratory effort normal. Cardiovascular system: S1 & S2 heard, RRR Gastrointestinal system: Decreased BS, obese, generally tender Central nervous system: Alert and oriented. No focal neurological deficits. Extremities: Symmetric 5 x 5 power. Skin: No rashes, lesions  Psychiatry: Judgement and insight appear normal. Mood & affect appropriate.   Data Reviewed: I have personally reviewed following labs and imaging studies  CBC: Recent Labs  Lab 05/12/18 1636 05/13/18 0950 05/15/18 1418 05/16/18 2058 05/17/18 0700  WBC 4.8 4.1 3.8 4.6 3.1*  NEUTROABS 1.7 1.6* 1.7 2.1  --   HGB 13.4 13.4 12.9 12.4 11.1*  HCT 39.9 39.8 38.3 38.4 34.9*  MCV 91 92.1 91 92.5 93.8  PLT 170 158 167 146* 540*   Basic Metabolic Panel: Recent Labs  Lab 05/13/18 0950 05/15/18 1418 05/16/18 2058 05/17/18 0700  NA 138 141 141 141  K 3.6 3.8 3.5 3.4*  CL 106 103 106 105  CO2 25 23 27 28   GLUCOSE 83 117* 102* 104*  BUN 12 7 5* 5*  CREATININE 0.70 0.66 0.82 0.84  CALCIUM 9.0 8.8 9.1 8.4*   GFR: Estimated Creatinine Clearance: 124.5 mL/min (by C-G formula based on SCr of 0.84 mg/dL). Liver Function Tests: Recent Labs  Lab 05/13/18 0950 05/15/18 1418 05/16/18 2058 05/17/18 0700  AST 31 35 52* 47*  ALT 37 52* 64* 60*  ALKPHOS 68 88 67 58  BILITOT 0.7 0.7 0.8 0.8  PROT 7.3 6.5 6.5 5.7*  ALBUMIN 4.0 4.1 3.4* 3.0*   Recent Labs  Lab 05/13/18 0950 05/16/18 2058  LIPASE 28 26   No results for input(s): AMMONIA in the last 168 hours. Coagulation Profile: No results for input(s): INR, PROTIME in  the last 168 hours. Cardiac Enzymes: No results for input(s): CKTOTAL, CKMB, CKMBINDEX, TROPONINI in the last 168 hours. BNP (last 3 results) No results for input(s): PROBNP in the last 8760 hours. HbA1C: No results for input(s): HGBA1C in the last 72 hours. CBG: No results for input(s): GLUCAP in the last 168 hours. Lipid Profile: No results for input(s): CHOL, HDL, LDLCALC, TRIG, CHOLHDL, LDLDIRECT in the last 72 hours. Thyroid Function Tests: No results for input(s): TSH, T4TOTAL, FREET4, T3FREE, THYROIDAB in the last 72 hours. Anemia Panel: No results for input(s): VITAMINB12, FOLATE, FERRITIN, TIBC, IRON, RETICCTPCT in the last 72 hours. Sepsis Labs: Recent Labs  Lab 05/16/18 2108 05/16/18 2303  LATICACIDVEN 0.95 0.75    Recent Results (from the past 240 hour(s))  WET PREP FOR [place],  YEAST, CLUE     Status: None   Collection Time: 05/12/18  4:13 PM  Result Value Ref Range Status   Trichomonas Exam Negative Negative Final   Yeast Exam Negative Negative Final   Clue Cell Exam Negative Negative Final    Comment: wbc  0-5 epi's  many bacteria  many   Microscopic Examination     Status: None   Collection Time: 05/12/18  4:13 PM  Result Value Ref Range Status   WBC, UA None seen 0 - 5 /hpf Final   RBC, UA None seen 0 - 2 /hpf Final   Epithelial Cells (non renal) 0-10 0 - 10 /hpf Final   Renal Epithel, UA None seen None seen /hpf Final   Mucus, UA Present Not Estab. Final   Bacteria, UA Few None seen/Few Final  Chlamydia/Gonococcus/Trichomonas, NAA     Status: None   Collection Time: 05/12/18  4:54 PM  Result Value Ref Range Status   Chlamydia by NAA Negative Negative Final   Gonococcus by NAA Negative Negative Final   Trich vag by NAA Negative Negative Final  Culture, blood (single) w Reflex to ID Panel     Status: None (Preliminary result)   Collection Time: 05/15/18  2:18 PM  Result Value Ref Range Status   BLOOD CULTURE, ROUTINE Preliminary report  Preliminary    Organism ID, Bacteria Comment  Preliminary    Comment: No growth detected at this time.  Wet prep, genital     Status: Abnormal   Collection Time: 05/17/18 12:05 AM  Result Value Ref Range Status   Yeast Wet Prep HPF POC PRESENT (A) NONE SEEN Final   Trich, Wet Prep NONE SEEN NONE SEEN Final   Clue Cells Wet Prep HPF POC PRESENT (A) NONE SEEN Final   WBC, Wet Prep HPF POC MANY (A) NONE SEEN Final   Sperm NONE SEEN  Final    Comment: Performed at [facility], [place] [address] [place], [place] [address]     Radiology Studies: Ct Abdomen Pelvis W Contrast  Result Date: 05/16/2018 CLINICAL DATA:  Left upper quadrant pain for 3 weeks with diagnosis of splenomegaly. EXAM: CT ABDOMEN AND PELVIS WITH CONTRAST TECHNIQUE: Multidetector CT imaging of the abdomen and pelvis was performed using the standard protocol following bolus administration of intravenous contrast. CONTRAST:  185mL ISOVUE-370 IOPAMIDOL (ISOVUE-370) INJECTION 76% COMPARISON:  Two days ago. FINDINGS: Lower chest: Clear lung bases. Normal heart size without pericardial or pleural effusion. Hepatobiliary: Normal liver. Cholecystectomy, without biliary ductal dilatation. Pancreas: Normal, without mass or ductal dilatation. Spleen: Splenomegaly, including at 12.4 cm craniocaudal, 16.9 x 8.9 cm transverse. (volume = 980 cc). Grossly similar to the recent study. No evidence of focal splenic lesion or splenic rupture/infarct. Adrenals/Urinary Tract: Normal adrenal glands. Normal kidneys, without hydronephrosis. Decompressed urinary bladder. Stomach/Bowel: Gastric antral underdistention. Normal colon, appendix, and terminal ileum. Normal small bowel. Vascular/Lymphatic: Normal caliber of the aorta and branch vessels. Small abdominal retroperitoneal nodes are not pathologic by size criteria. No pelvic sidewall adenopathy. Reproductive: Normal uterus and adnexa. Other: Trace free pelvic fluid is likely physiologic. Musculoskeletal: Bilateral L5  pars defects without significant malalignment. IMPRESSION: 1. No acute process in the abdomen or pelvis. 2. Similar splenomegaly, without specific evidence of splenic lesion or infarct. 3. 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 Electronically Signed   By: Abigail Miyamoto M.D.   On: 05/16/2018 23:42    Scheduled Meds: . clindamycin  300 mg Oral Q6H  . clotrimazole  1 Applicatorful Vaginal QHS  . pantoprazole  40 mg Oral QAC breakfast  . valACYclovir  500 mg Oral Daily   Continuous Infusions: . dextrose 5 % and 0.9% NaCl 125 mL/hr at 05/17/18 0856     LOS: 0 days   Marylu Lund, MD [facility] Pager On Amion  If 7PM-7AM, please contact night-coverage 05/17/2018, 6:30 PM

## 2018-05-17 NOTE — ED Notes (Signed)
Nurse will draw labs. 

## 2018-05-17 NOTE — Progress Notes (Signed)
Pt very rude to staff and doctor(cussing and degrading everyone). Copy of CT given to pt.Colleen Valentine here to talk with pt also.

## 2018-05-17 NOTE — H&P (Signed)
History and Physical   Colleen Valentine ELF:[id number] DOB: [birth year] DOA: 05/16/2018  Referring MD/NP/PA: Dr. Sedonia Small  PCP: Sharion Balloon, FNP   Outpatient Specialists: None  Patient coming from: Home  Chief Complaint: Abdominal pain nausea vomiting  HPI: Colleen Valentine is a 22 y.o. female with medical history significant of ADHD, seizure disorder, insomnia morbid obesity, polysubstance abuse, tobacco abuse, recent diagnosis of splenomegaly, GERD who was at any pain only yesterday with nausea vomiting abdominal pain.  Diagnosed with splenomegaly and no UTI.  Pain at that time was localized to the right lower abdomen.  Suspected appendicitis.  Patient was scheduled for test in the regard.  No dysuria but has some fever.  She went home but symptoms get worse and came here today.  In the ER today patient has continuous nausea with vomiting.  Patient also reported vaginal discharge.  She was seen and examined and work-up here suggested acute PID with vaginal yeast infection.  Patient is being admitted therefore for treatment.  ED Course: Temperature is 98 to blood pressure is 105/77 pulse 102 respiratory rate of 17 oxygen sat 88% room air.  Patient's CBC and chemistry all appear to be within normal.  Lactic acid is 0.75.  Pelvic exam was tender with visible vaginal yeast infection.  CT abdomen pelvis showed no acute process.  Urinalysis showed positive nitrite many bacteria with yeast urea calcium oxalate crystals WBC 6-10 RBC 6-10 with cloudy urine.  Patient is therefore being admitted with possible acute pyelonephritis.  Review of Systems: As per HPI otherwise 10 point review of systems negative.    Past Medical History:  Diagnosis Date  . Anxiety   . Complication of anesthesia   . Depression   . GERD (gastroesophageal reflux disease)   . Insomnia   . Obesity   . PONV (postoperative nausea and vomiting)   . Seizures ([place])    once in 2016 due to alcohol poisioning  . Suicide [facility])      Past Surgical History:  Procedure Laterality Date  . CHOLECYSTECTOMY  2015  . COSMETIC SURGERY  Age 45   dog bite to face     reports that she has been smoking cigarettes. She has a 2.50 pack-year smoking history. She has never used smokeless tobacco. She reports current alcohol use. She reports current drug use. Drug: Marijuana.  No Known Allergies  Family History  Problem Relation Age of Onset  . Alcohol abuse Mother   . Stroke Mother   . Bipolar disorder Mother   . Alcohol abuse Father   . Bipolar disorder Father   . Colon cancer Maternal Grandfather   . Crohn's disease Paternal Grandmother   . Crohn's disease Paternal Aunt   . Crohn's disease Paternal Aunt   . Crohn's disease Paternal Uncle   . Colon cancer Paternal Uncle   . Cancer Maternal Aunt        "stomach cancer"  . Celiac disease Neg Hx      Prior to Admission medications   Medication Sig Start Date End Date Taking? Authorizing Provider  HYDROcodone-acetaminophen (NORCO/VICODIN) 5-325 MG tablet Take 1 tablet by mouth every 4 (four) hours as needed. Patient not taking: Reported on 05/15/2018 05/13/18   Evalee Jefferson, PA-C  ondansetron (ZOFRAN ODT) 8 MG disintegrating tablet Take 1 tablet (8 mg total) by mouth every 8 (eight) hours as needed for nausea or vomiting. 05/13/18   Idol, Almyra Free, PA-C  pantoprazole (PROTONIX) 40 MG tablet Take 1 tablet (40 mg total) by  mouth daily before breakfast. 01/19/18   Mahala Menghini, PA-C  valACYclovir (VALTREX) 500 MG tablet TAKE 1 TABLET BY MOUTH DAILY. 01/21/18   Sharion Balloon, FNP    Physical Exam: Vitals:   05/16/18 1923 05/16/18 1924 05/16/18 2115 05/16/18 2130  BP: 105/77  109/69 122/65  Pulse: 95  84 93  Resp: 17     Temp: 98.2 F (36.8 C)     TempSrc: Oral     SpO2: 99%  97% 97%  Weight:  98.9 kg    Height:  5\' 5"  (1.651 m)        Constitutional: NAD, acutely ill looking and morbidly obese Vitals:   05/16/18 1923 05/16/18 1924 05/16/18 2115 05/16/18  2130  BP: 105/77  109/69 122/65  Pulse: 95  84 93  Resp: 17     Temp: 98.2 F (36.8 C)     TempSrc: Oral     SpO2: 99%  97% 97%  Weight:  98.9 kg    Height:  5\' 5"  (1.651 m)     Eyes: PERRL, lids and conjunctivae normal ENMT: Mucous membranes are dry posterior pharynx clear of any exudate or lesions.Normal dentition.  Neck: normal, supple, no masses, no thyromegaly Respiratory: clear to auscultation bilaterally, no wheezing, no crackles. Normal respiratory effort. No accessory muscle use.  Cardiovascular: Regular rate and rhythm, no murmurs / rubs / gallops. No extremity edema. 2+ pedal pulses. No carotid bruits.  Abdomen: Diffuse tenderness, no masses palpated. No hepatosplenomegaly. Bowel sounds positive.  Musculoskeletal: no clubbing / cyanosis. No joint deformity upper and lower extremities. Good ROM, no contractures. Normal muscle tone.  Skin: no rashes, lesions, ulcers. No induration Neurologic: CN 2-12 grossly intact. Sensation intact, DTR normal. Strength 5/5 in all 4.  Psychiatric: Normal judgment and insight. Alert and oriented x 3. Normal mood.     Labs on Admission: I have personally reviewed following labs and imaging studies  CBC: Recent Labs  Lab 05/12/18 1636 05/13/18 0950 05/15/18 1418 05/16/18 2058  WBC 4.8 4.1 3.8 4.6  NEUTROABS 1.7 1.6* 1.7 2.1  HGB 13.4 13.4 12.9 12.4  HCT 39.9 39.8 38.3 38.4  MCV 91 92.1 91 92.5  PLT 170 158 167 570*   Basic Metabolic Panel: Recent Labs  Lab 05/13/18 0950 05/15/18 1418 05/16/18 2058  NA 138 141 141  K 3.6 3.8 3.5  CL 106 103 106  CO2 25 23 27   GLUCOSE 83 117* 102*  BUN 12 7 5*  CREATININE 0.70 0.66 0.82  CALCIUM 9.0 8.8 9.1   GFR: Estimated Creatinine Clearance: 127.5 mL/min (by C-G formula based on SCr of 0.82 mg/dL). Liver Function Tests: Recent Labs  Lab 05/13/18 0950 05/15/18 1418 05/16/18 2058  AST 31 35 52*  ALT 37 52* 64*  ALKPHOS 68 88 67  BILITOT 0.7 0.7 0.8  PROT 7.3 6.5 6.5  ALBUMIN  4.0 4.1 3.4*   Recent Labs  Lab 05/13/18 0950 05/16/18 2058  LIPASE 28 26   No results for input(s): AMMONIA in the last 168 hours. Coagulation Profile: No results for input(s): INR, PROTIME in the last 168 hours. Cardiac Enzymes: No results for input(s): CKTOTAL, CKMB, CKMBINDEX, TROPONINI in the last 168 hours. BNP (last 3 results) No results for input(s): PROBNP in the last 8760 hours. HbA1C: No results for input(s): HGBA1C in the last 72 hours. CBG: No results for input(s): GLUCAP in the last 168 hours. Lipid Profile: No results for input(s): CHOL, HDL, LDLCALC, TRIG, CHOLHDL, LDLDIRECT in  the last 72 hours. Thyroid Function Tests: No results for input(s): TSH, T4TOTAL, FREET4, T3FREE, THYROIDAB in the last 72 hours. Anemia Panel: No results for input(s): VITAMINB12, FOLATE, FERRITIN, TIBC, IRON, RETICCTPCT in the last 72 hours. Urine analysis:    Component Value Date/Time   COLORURINE AMBER (A) 05/16/2018 2037   APPEARANCEUR CLOUDY (A) 05/16/2018 2037   APPEARANCEUR Clear 05/12/2018 1613   LABSPEC 1.025 05/16/2018 2037   PHURINE 6.5 05/16/2018 2037   GLUCOSEU NEGATIVE 05/16/2018 2037   HGBUR NEGATIVE 05/16/2018 2037   BILIRUBINUR MODERATE (A) 05/16/2018 2037   BILIRUBINUR Negative 05/12/2018 1613   KETONESUR 15 (A) 05/16/2018 2037   PROTEINUR NEGATIVE 05/16/2018 2037   UROBILINOGEN 0.2 07/21/2012 0645   NITRITE POSITIVE (A) 05/16/2018 2037   LEUKOCYTESUR MODERATE (A) 05/16/2018 2037   LEUKOCYTESUR Negative 05/12/2018 1613   Sepsis Labs: @LABRCNTIP (procalcitonin:4,lacticidven:4) ) Recent Results (from the past 240 hour(s))  WET PREP FOR TRICH, YEAST, CLUE     Status: None   Collection Time: 05/12/18  4:13 PM  Result Value Ref Range Status   Trichomonas Exam Negative Negative Final   Yeast Exam Negative Negative Final   Clue Cell Exam Negative Negative Final    Comment: wbc  0-5 epi's  many bacteria  many   Microscopic Examination     Status: None    Collection Time: 05/12/18  4:13 PM  Result Value Ref Range Status   WBC, UA None seen 0 - 5 /hpf Final   RBC, UA None seen 0 - 2 /hpf Final   Epithelial Cells (non renal) 0-10 0 - 10 /hpf Final   Renal Epithel, UA None seen None seen /hpf Final   Mucus, UA Present Not Estab. Final   Bacteria, UA Few None seen/Few Final  Chlamydia/Gonococcus/Trichomonas, NAA     Status: None   Collection Time: 05/12/18  4:54 PM  Result Value Ref Range Status   Chlamydia by NAA Negative Negative Final   Gonococcus by NAA Negative Negative Final   Trich vag by NAA Negative Negative Final  Culture, blood (single) w Reflex to ID Panel     Status: None (Preliminary result)   Collection Time: 05/15/18  2:18 PM  Result Value Ref Range Status   BLOOD CULTURE, ROUTINE Preliminary report  Preliminary   Organism ID, Bacteria Comment  Preliminary    Comment: No growth detected at this time.     Radiological Exams on Admission: Ct Abdomen Pelvis W Contrast  Result Date: 05/16/2018 CLINICAL DATA:  Left upper quadrant pain for 3 weeks with diagnosis of splenomegaly. EXAM: CT ABDOMEN AND PELVIS WITH CONTRAST TECHNIQUE: Multidetector CT imaging of the abdomen and pelvis was performed using the standard protocol following bolus administration of intravenous contrast. CONTRAST:  155mL ISOVUE-370 IOPAMIDOL (ISOVUE-370) INJECTION 76% COMPARISON:  Two days ago. FINDINGS: Lower chest: Clear lung bases. Normal heart size without pericardial or pleural effusion. Hepatobiliary: Normal liver. Cholecystectomy, without biliary ductal dilatation. Pancreas: Normal, without mass or ductal dilatation. Spleen: Splenomegaly, including at 12.4 cm craniocaudal, 16.9 x 8.9 cm transverse. (volume = 980 cc). Grossly similar to the recent study. No evidence of focal splenic lesion or splenic rupture/infarct. Adrenals/Urinary Tract: Normal adrenal glands. Normal kidneys, without hydronephrosis. Decompressed urinary bladder. Stomach/Bowel: Gastric  antral underdistention. Normal colon, appendix, and terminal ileum. Normal small bowel. Vascular/Lymphatic: Normal caliber of the aorta and branch vessels. Small abdominal retroperitoneal nodes are not pathologic by size criteria. No pelvic sidewall adenopathy. Reproductive: Normal uterus and adnexa. Other: Trace free pelvic fluid  is likely physiologic. 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 IMPRESSION: 1. No acute process in the abdomen or pelvis. 2. Similar splenomegaly, without specific evidence of splenic lesion or infarct. 3. 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 Electronically Signed   By: Abigail Miyamoto M.D.   On: 05/16/2018 23:42     Assessment/Plan Principal Problem:   Acute pyelonephritis Active Problems:   MDD (major depressive disorder), recurrent episode, moderate (HCC)   Polysubstance abuse (HCC)   ADHD (attention deficit hyperactivity disorder), combined type   GERD (gastroesophageal reflux disease)   Morbid obesity with BMI of 40.0-44.9, adult ([place])     #1 acute pyelonephritis: Not classic for typical [place].  Patient will be admitted however initiated on IV Rocephin.  Urine and blood cultures will be obtained.  Transition to oral antibiotics as patient symptoms improved.  #2 intractable nausea with vomiting: Suspected due to pyelonephritis.  Polysubstance abuse noted in patient and will check her urine drug screen.  This could also reflect cyclical vomiting syndrome.  She has had multiple episode of intractable vomiting's in the past.  Will treat symptomatically and hydrate.  #3 ADHD: Continue with home regimen of medications.  #4 GERD: Continue with PPIs.  #5 morbid obesity: Dietary counseling.  #6 tobacco abuse: Tobacco cessation counseling with nicotine patch to be ordered.   DVT prophylaxis: SCD Code Status: Full code Family Communication: Sisters at bedside Disposition Plan: Home Consults called: noneNone  Admission  status: Observation  Severity of Illness: The appropriate patient status for this patient is OBSERVATION. Observation status is judged to be reasonable and necessary in order to provide the required intensity of service to ensure the patient's safety. The patient's presenting symptoms, physical exam findings, and initial radiographic and laboratory data in the context of their medical condition is felt to place them at decreased risk for further clinical deterioration. Furthermore, it is anticipated that the patient will be medically stable for discharge from the hospital within 2 midnights of admission. The following factors support the patient status of observation.   " The patient's presenting symptoms include nausea vomiting abdominal pain. " The physical exam findings include mild epigastric to right upper quadrant tenderness. " The initial radiographic and laboratory data are unremarkable CT but UTI on urine screen.     Barbette Merino MD [facility] Pager [phone]  If 7PM-7AM, please contact night-coverage [email or web address] Password [facility]  05/17/2018, 12:16 AM

## 2018-05-18 LAB — CMP14+EGFR
ALT: 52 IU/L — ABNORMAL HIGH (ref 0–32)
AST: 35 IU/L (ref 0–40)
Albumin/Globulin Ratio: 1.7 (ref 1.2–2.2)
Albumin: 4.1 g/dL (ref 3.5–5.5)
Alkaline Phosphatase: 88 IU/L (ref 39–117)
BUN/Creatinine Ratio: 11 (ref 9–23)
BUN: 7 mg/dL (ref 6–20)
Bilirubin Total: 0.7 mg/dL (ref 0.0–1.2)
CO2: 23 mmol/L (ref 20–29)
Calcium: 8.8 mg/dL (ref 8.7–10.2)
Chloride: 103 mmol/L (ref 96–106)
Creatinine, Ser: 0.66 mg/dL (ref 0.57–1.00)
GFR calc Af Amer: 147 mL/min/{1.73_m2} (ref >59)
GFR calc non Af Amer: 128 mL/min/{1.73_m2} (ref >59)
Globulin, Total: 2.4 g/dL (ref 1.5–4.5)
Glucose: 117 mg/dL — ABNORMAL HIGH (ref 65–99)
Potassium: 3.8 mmol/L (ref 3.5–5.2)
Sodium: 141 mmol/L (ref 134–144)
Total Protein: 6.5 g/dL (ref 6.0–8.5)

## 2018-05-18 LAB — CBC WITH DIFFERENTIAL/PLATELET
Basophils Absolute: 0 10*3/uL (ref 0.0–0.2)
Basos: 1 %
EOS (ABSOLUTE): 0.1 10*3/uL (ref 0.0–0.4)
Eos: 3 %
Hematocrit: 38.3 % (ref 34.0–46.6)
Hemoglobin: 12.9 g/dL (ref 11.1–15.9)
Immature Grans (Abs): 0 10*3/uL (ref 0.0–0.1)
Immature Granulocytes: 1 %
Lymphocytes Absolute: 1.6 10*3/uL (ref 0.7–3.1)
Lymphs: 43 %
MCH: 30.6 pg (ref 26.6–33.0)
MCHC: 33.7 g/dL (ref 31.5–35.7)
MCV: 91 fL (ref 79–97)
Monocytes Absolute: 0.3 10*3/uL (ref 0.1–0.9)
Monocytes: 7 %
Neutrophils Absolute: 1.7 10*3/uL (ref 1.4–7.0)
Neutrophils: 45 %
Platelets: 167 10*3/uL (ref 150–450)
RBC: 4.21 x10E6/uL (ref 3.77–5.28)
RDW: 12.5 % (ref 12.3–15.4)
WBC: 3.8 10*3/uL (ref 3.4–10.8)

## 2018-05-18 LAB — BASIC METABOLIC PANEL
Anion gap: 11 (ref 5–15)
BUN: <5 mg/dL — ABNORMAL LOW (ref 6–20)
CO2: 23 mmol/L (ref 22–32)
Calcium: 8.3 mg/dL — ABNORMAL LOW (ref 8.9–10.3)
Chloride: 108 mmol/L (ref 98–111)
Creatinine, Ser: 0.65 mg/dL (ref 0.44–1.00)
GFR calc Af Amer: >60 mL/min (ref >60)
GFR calc non Af Amer: >60 mL/min (ref >60)
Glucose, Bld: 107 mg/dL — ABNORMAL HIGH (ref 70–99)
Potassium: 3.7 mmol/L (ref 3.5–5.1)
Sodium: 142 mmol/L (ref 135–145)

## 2018-05-18 LAB — GC/CHLAMYDIA PROBE AMP (~~LOC~~) NOT AT ARMC
Chlamydia: NEGATIVE
Neisseria Gonorrhea: NEGATIVE

## 2018-05-18 LAB — CBC
HCT: 31.9 % — ABNORMAL LOW (ref 36.0–46.0)
Hemoglobin: 10.6 g/dL — ABNORMAL LOW (ref 12.0–15.0)
MCH: 30.9 pg (ref 26.0–34.0)
MCHC: 33.2 g/dL (ref 30.0–36.0)
MCV: 93 fL (ref 80.0–100.0)
Platelets: 121 10*3/uL — ABNORMAL LOW (ref 150–400)
RBC: 3.43 MIL/uL — ABNORMAL LOW (ref 3.87–5.11)
RDW: 13.2 % (ref 11.5–15.5)
WBC: 3.5 10*3/uL — ABNORMAL LOW (ref 4.0–10.5)
nRBC: 0 % (ref 0.0–0.2)

## 2018-05-18 LAB — URINE CULTURE: Culture: <10000 — AB

## 2018-05-18 LAB — CERVICOVAGINAL ANCILLARY ONLY
Chlamydia: NEGATIVE
Neisseria Gonorrhea: NEGATIVE

## 2018-05-18 LAB — EPSTEIN-BARR VIRUS VCA ANTIBODY PANEL
EBV Early Antigen Ab, IgG: <9 U/mL (ref 0.0–8.9)
EBV NA IgG: 156 U/mL — ABNORMAL HIGH (ref 0.0–17.9)
EBV VCA IgG: >600 U/mL — ABNORMAL HIGH (ref 0.0–17.9)
EBV VCA IgM: <36 U/mL (ref 0.0–35.9)

## 2018-05-18 LAB — RPR: RPR Ser Ql: NONREACTIVE

## 2018-05-18 NOTE — Discharge Summary (Addendum)
Physician Discharge Summary  Colleen Valentine BTD:[id number] DOB: 02-12-1996 DOA: 05/16/2018  PCP: Sharion Balloon, FNP  Admit date: 05/16/2018 Discharge date: 05/28/2018  Admitted From: Home Disposition:  Left Against Medical Advise    Brief/Interim Summary: 22 y.o.femalewith medical history significant ofADHD, seizure disorder, insomnia morbid obesity, polysubstance abuse, tobacco abuse, recent diagnosis of splenomegaly, GERD who was at any pain only yesterday with nausea vomiting abdominal pain. Diagnosed with splenomegaly and noUTI. Pain at that time was localized to the right lower abdomen. Suspected appendicitis. Patient was scheduled for test in the regard. No dysuria but has some fever. She went home but symptoms get worse and came here today. In the ER today patient has continuous nausea with vomiting. Patient also reported vaginal discharge. She was seen and examined and work-up here suggested acute PID with vaginal yeast infection. Patient is being admitted therefore for treatment.  ED Course:Temperature is 98 to blood pressure is 105/77 pulse 102 respiratory rate of 17 oxygen sat 88% room air. Patient's CBC and chemistry all appear to be within normal. Lactic acid is 0.75. Pelvic exam was tender with visible vaginal yeast infection.CT abdomen pelvis showed no acute process. Urinalysis showed positive nitrite many bacteria with yeast urea calcium oxalate crystals WBC 6-10 RBC 6-10 with cloudy urine. Patient is therefore being admitted with possible acute pyelonephritis  Discharge Diagnoses:  Principal Problem:   Abdominal pain Active Problems:   MDD (major depressive disorder), recurrent episode, moderate (HCC)   Polysubstance abuse ([place])   ADHD (attention deficit hyperactivity disorder), combined type   GERD (gastroesophageal reflux disease)   Morbid obesity with BMI of 40.0-44.9, adult ([place])  #1 acute pyelonephritis ruled out:  -Not classic for typical [place].   -CT reviewed, kidneys with unremarkable findings on imaging -Urine culture with insignificant growth  #2 intractable nausea with vomiting: -Images on CT abd/pelvis personally reviewed. Findings of stool throughout colon noted -Patient has reported no bowel movement over past 10 days -Given trial of mg citrate without significant stool documented  #3 ADHD: Continued with home regimen of medications as tolerated  #4 GERD:Continued with PPIs as tolerated  #5 morbid obesity:Recommend diet/lifestyle modification  #6 tobacco abuse: Tobacco cessation counseling with nicotine patch to be ordered.  #7 bacterial vaginosis: reportedly failed course of flagyl. Clue cells as well as yeast on wet prep. Given trial of clindamycin, anticipate 7 days of tx. Yeast also noted. Given trial of fluconazole 150mg  x 1 dose  #8 Splenomegaly: Enlarged spleen on CT that was unchanged compared to 12/18. No evidence of splenic infarct or lesion or rupture.    Before patient could be re-evaluated on 12/23 to discuss treatment plan, patient elected to leave hospital against medical advise. Patient understood the risks of leaving which includes, but not limited to, further decompensation or death   Discharge Instructions   Allergies as of 05/18/2018   No Known Allergies     Medication List    ASK your doctor about these medications   HYDROcodone-acetaminophen 5-325 MG tablet Commonly known as:  NORCO/VICODIN Take 1 tablet by mouth every 4 (four) hours as needed.   ondansetron 8 MG disintegrating tablet Commonly known as:  ZOFRAN ODT Take 1 tablet (8 mg total) by mouth every 8 (eight) hours as needed for nausea or vomiting.   pantoprazole 40 MG tablet Commonly known as:  PROTONIX Take 1 tablet (40 mg total) by mouth daily before breakfast.   PROBIOTIC ACIDOPHILUS PO Take 1 tablet by mouth daily.   valACYclovir 500  MG tablet Commonly known as:  VALTREX TAKE 1 TABLET BY MOUTH DAILY.        No Known Allergies    Procedures/Studies: Ct Abdomen Pelvis W Contrast  Result Date: 05/16/2018 CLINICAL DATA:  Left upper quadrant pain for 3 weeks with diagnosis of splenomegaly. EXAM: CT ABDOMEN AND PELVIS WITH CONTRAST TECHNIQUE: Multidetector CT imaging of the abdomen and pelvis was performed using the standard protocol following bolus administration of intravenous contrast. CONTRAST:  149mL ISOVUE-370 IOPAMIDOL (ISOVUE-370) INJECTION 76% COMPARISON:  Two days ago. FINDINGS: Lower chest: Clear lung bases. Normal heart size without pericardial or pleural effusion. Hepatobiliary: Normal liver. Cholecystectomy, without biliary ductal dilatation. Pancreas: Normal, without mass or ductal dilatation. Spleen: Splenomegaly, including at 12.4 cm craniocaudal, 16.9 x 8.9 cm transverse. (volume = 980 cc). Grossly similar to the recent study. No evidence of focal splenic lesion or splenic rupture/infarct. Adrenals/Urinary Tract: Normal adrenal glands. Normal kidneys, without hydronephrosis. Decompressed urinary bladder. Stomach/Bowel: Gastric antral underdistention. Normal colon, appendix, and terminal ileum. Normal small bowel. Vascular/Lymphatic: Normal caliber of the aorta and branch vessels. Small abdominal retroperitoneal nodes are not pathologic by size criteria. No pelvic sidewall adenopathy. Reproductive: Normal uterus and adnexa. Other: Trace free pelvic fluid is likely physiologic. 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 IMPRESSION: 1. No acute process in the abdomen or pelvis. 2. Similar splenomegaly, without specific evidence of splenic lesion or infarct. 3. 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 Electronically Signed   By: Abigail Miyamoto M.D.   On: 05/16/2018 23:42   Ct Abdomen Pelvis W Contrast  Result Date: 05/13/2018 CLINICAL DATA:  Nausea for several weeks. Vomiting for 6 days. Abdominal pain for 3 days. Question appendicitis. EXAM: CT ABDOMEN  AND PELVIS WITH CONTRAST TECHNIQUE: Multidetector CT imaging of the abdomen and pelvis was performed using the standard protocol following bolus administration of intravenous contrast. CONTRAST:  100 mL ISOVUE-300 IOPAMIDOL (ISOVUE-300) INJECTION 61% COMPARISON:  CT abdomen and pelvis 12/19/2014. FINDINGS: Lower chest: Lung bases are clear. No pleural or pericardial effusion. Hepatobiliary: No focal liver abnormality is seen. Status post cholecystectomy. No biliary dilatation. Pancreas: Unremarkable. No pancreatic ductal dilatation or surrounding inflammatory changes. Spleen: No focal splenic lesion is identified. Splenic volume is calculated at 696 mL compared to 358 mL on the prior examination consistent with new splenomegaly. Upper normal is 411 mL. Adrenals/Urinary Tract: Adrenal glands are unremarkable. Kidneys are normal, without renal calculi, focal lesion, or hydronephrosis. Bladder is unremarkable. Stomach/Bowel: Stomach is within normal limits. Appendix appears normal. No evidence of bowel wall thickening, distention, or inflammatory changes. Vascular/Lymphatic: No significant vascular findings are present. No enlarged abdominal or pelvic lymph nodes. Reproductive: Uterus and bilateral adnexa are unremarkable. Other: None. Musculoskeletal: No acute bony abnormality is seen. 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 IMPRESSION: Negative for appendicitis or acute abnormality. Splenomegaly as described above is new since the prior CT. Cause is unknown. 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 Electronically Signed   By: Inge Rise M.D.   On: 05/13/2018 11:43     Discharge Exam: Vitals:   05/17/18 2053 05/18/18 0611  BP: 127/77 (!) 111/58  Pulse: 74 87  Resp: 20 20  Temp: 99 F (37.2 C) 98.1 F (36.7 C)  SpO2: 100% 99%   Vitals:   05/17/18 0634 05/17/18 1611 05/17/18 2053  05/18/18 0611  BP: 103/75 (!) 103/52 127/77 (!) 111/58  Pulse: 83 90 74 87  Resp:  19 17 20 20   Temp: 98 F (36.7 C) 98.2 F (36.8 C) 99 F (37.2 C) 98.1 F (36.7 C)  TempSrc: Oral Oral Oral Oral  SpO2: 97% 100% 100% 99%  Weight:      Height:        Left against medical advise  The results of significant diagnostics from this hospitalization (including imaging, microbiology, ancillary and laboratory) are listed below for reference.     Microbiology: Recent Results (from the past 240 hour(s))  WET PREP FOR [place], YEAST, CLUE     Status: Abnormal   Collection Time: 05/25/18  1:26 PM  Result Value Ref Range Status   Trichomonas Exam Negative Negative Final   Yeast Exam Negative Negative Final   Clue Cell Exam Positive (A) Negative Final    Comment: wbc  5-10 epi's  few bacteria  many   Microscopic Examination     Status: Abnormal   Collection Time: 05/25/18  1:26 PM  Result Value Ref Range Status   WBC, UA 6-10 (A) 0 - 5 /hpf Final   RBC, UA 0-2 0 - 2 /hpf Final   Epithelial Cells (non renal) 0-10 0 - 10 /hpf Final   Renal Epithel, UA None seen None seen /hpf Final   Mucus, UA Present Not Estab. Final   Bacteria, UA Moderate (A) None seen/Few Final  Chlamydia/Gonococcus/Trichomonas, NAA     Status: None   Collection Time: 05/25/18  2:58 PM  Result Value Ref Range Status   Chlamydia by NAA Negative Negative Final   Gonococcus by NAA Negative Negative Final   Trich vag by NAA Negative Negative Final  Urine Culture     Status: None   Collection Time: 05/25/18  2:58 PM  Result Value Ref Range Status   Urine Culture, Routine Final report  Final   Organism ID, Bacteria No growth  Final     Labs: BNP (last 3 results) No results for input(s): BNP in the last 8760 hours. Basic Metabolic Panel: No results for input(s): NA, K, CL, CO2, GLUCOSE, BUN, CREATININE, CALCIUM, MG, PHOS in the last 168 hours. Liver Function Tests: No results for input(s): AST, ALT, ALKPHOS,  BILITOT, PROT, ALBUMIN in the last 168 hours. No results for input(s): LIPASE, AMYLASE in the last 168 hours. No results for input(s): AMMONIA in the last 168 hours. CBC: No results for input(s): WBC, NEUTROABS, HGB, HCT, MCV, PLT in the last 168 hours. Cardiac Enzymes: No results for input(s): CKTOTAL, CKMB, CKMBINDEX, TROPONINI in the last 168 hours. BNP: Invalid input(s): POCBNP CBG: No results for input(s): GLUCAP in the last 168 hours. D-Dimer No results for input(s): DDIMER in the last 72 hours. Hgb A1c No results for input(s): HGBA1C in the last 72 hours. Lipid Profile No results for input(s): CHOL, HDL, LDLCALC, TRIG, CHOLHDL, LDLDIRECT in the last 72 hours. Thyroid function studies No results for input(s): TSH, T4TOTAL, T3FREE, THYROIDAB in the last 72 hours.  Invalid input(s): FREET3 Anemia work up No results for input(s): VITAMINB12, FOLATE, FERRITIN, TIBC, IRON, RETICCTPCT in the last 72 hours. Urinalysis    Component Value Date/Time   COLORURINE AMBER (A) 05/16/2018 2037   APPEARANCEUR Cloudy (A) 05/25/2018 1326   LABSPEC 1.025 05/16/2018 2037   PHURINE 6.5 05/16/2018 2037   GLUCOSEU Negative 05/25/2018 1326   HGBUR NEGATIVE 05/16/2018 2037   BILIRUBINUR Negative 05/25/2018 1326   KETONESUR 15 (A) 05/16/2018 2037   PROTEINUR Trace (A) 05/25/2018 [place] NEGATIVE 05/16/2018 2037  UROBILINOGEN 0.2 07/21/2012 0645   NITRITE Negative 05/25/2018 1326   NITRITE POSITIVE (A) 05/16/2018 2037   LEUKOCYTESUR Negative 05/25/2018 1326   Sepsis Labs Invalid input(s): PROCALCITONIN,  WBC,  LACTICIDVEN Microbiology Recent Results (from the past 240 hour(s))  WET PREP FOR TRICH, YEAST, CLUE     Status: Abnormal   Collection Time: 05/25/18  1:26 PM  Result Value Ref Range Status   Trichomonas Exam Negative Negative Final   Yeast Exam Negative Negative Final   Clue Cell Exam Positive (A) Negative Final    Comment: wbc  5-10 epi's  few bacteria  many    Microscopic Examination     Status: Abnormal   Collection Time: 05/25/18  1:26 PM  Result Value Ref Range Status   WBC, UA 6-10 (A) 0 - 5 /hpf Final   RBC, UA 0-2 0 - 2 /hpf Final   Epithelial Cells (non renal) 0-10 0 - 10 /hpf Final   Renal Epithel, UA None seen None seen /hpf Final   Mucus, UA Present Not Estab. Final   Bacteria, UA Moderate (A) None seen/Few Final  Chlamydia/Gonococcus/Trichomonas, NAA     Status: None   Collection Time: 05/25/18  2:58 PM  Result Value Ref Range Status   Chlamydia by NAA Negative Negative Final   Gonococcus by NAA Negative Negative Final   Trich vag by NAA Negative Negative Final  Urine Culture     Status: None   Collection Time: 05/25/18  2:58 PM  Result Value Ref Range Status   Urine Culture, Routine Final report  Final   Organism ID, Bacteria No growth  Final     SIGNED:   Marylu Lund, MD  [facility] 05/28/2018, 6:36 PM  If 7PM-7AM, please contact night-coverage

## 2018-05-18 NOTE — Progress Notes (Signed)
Pt. Called nurses station requesting to speak to the nurse. When I arrived to the room pt. Stated  "I'm just getting ready to go to [facility]" pt. Stated she was unsatisfied with the care she had received and wanted to leave against medical advice. Pt. Started to removed IV, pulling off the tape used to secure the IV. I removed the catheter, applied the gauze and pressure to the site of the IV. IV removed clean and intact. AMA form presented to the patient filled out and ready for her to sign, pt. Refused to sign and stated she would sign when she got out of the shower; This conversation was witnessed with Shanon Ace, RN. Went in 20 minutes later to follow up with the patient and she left without signing the form. Formed signed by RN and witness RN Shanon Ace. MD Wyline Copas made aware of pt. Leaving AMA.  [place] Lanae Boast, RN

## 2018-05-19 ENCOUNTER — Encounter: Payer: Self-pay | Admitting: Internal Medicine

## 2018-05-21 LAB — CULTURE, BLOOD (SINGLE)

## 2018-05-25 ENCOUNTER — Ambulatory Visit (INDEPENDENT_AMBULATORY_CARE_PROVIDER_SITE_OTHER): Payer: Medicaid Other | Admitting: Family Medicine

## 2018-05-25 ENCOUNTER — Encounter: Payer: Self-pay | Admitting: Family Medicine

## 2018-05-25 VITALS — BP 113/77 | HR 86 | Temp 98.4°F | Ht 65.0 in | Wt 230.0 lb

## 2018-05-25 DIAGNOSIS — Z8619 Personal history of other infectious and parasitic diseases: Secondary | ICD-10-CM | POA: Diagnosis not present

## 2018-05-25 DIAGNOSIS — R300 Dysuria: Secondary | ICD-10-CM | POA: Diagnosis not present

## 2018-05-25 DIAGNOSIS — R112 Nausea with vomiting, unspecified: Secondary | ICD-10-CM

## 2018-05-25 DIAGNOSIS — N760 Acute vaginitis: Secondary | ICD-10-CM | POA: Diagnosis not present

## 2018-05-25 DIAGNOSIS — N898 Other specified noninflammatory disorders of vagina: Secondary | ICD-10-CM | POA: Diagnosis not present

## 2018-05-25 DIAGNOSIS — N3000 Acute cystitis without hematuria: Secondary | ICD-10-CM | POA: Diagnosis not present

## 2018-05-25 DIAGNOSIS — B9689 Other specified bacterial agents as the cause of diseases classified elsewhere: Secondary | ICD-10-CM

## 2018-05-25 DIAGNOSIS — B3731 Acute candidiasis of vulva and vagina: Secondary | ICD-10-CM

## 2018-05-25 DIAGNOSIS — R161 Splenomegaly, not elsewhere classified: Secondary | ICD-10-CM | POA: Diagnosis not present

## 2018-05-25 DIAGNOSIS — B373 Candidiasis of vulva and vagina: Secondary | ICD-10-CM | POA: Diagnosis not present

## 2018-05-25 LAB — WET PREP FOR TRICH, YEAST, CLUE
Clue Cell Exam: POSITIVE — AB
Trichomonas Exam: NEGATIVE
Yeast Exam: NEGATIVE

## 2018-05-25 LAB — URINALYSIS, ROUTINE W REFLEX MICROSCOPIC
Bilirubin, UA: NEGATIVE
Glucose, UA: NEGATIVE
Leukocytes, UA: NEGATIVE
Nitrite, UA: NEGATIVE
RBC, UA: NEGATIVE
Specific Gravity, UA: >1.03 — ABNORMAL HIGH (ref 1.005–1.030)
Urobilinogen, Ur: 1 mg/dL (ref 0.2–1.0)
pH, UA: 6 (ref 5.0–7.5)

## 2018-05-25 LAB — MICROSCOPIC EXAMINATION: Renal Epithel, UA: NONE SEEN /hpf

## 2018-05-25 MED ORDER — ONDANSETRON HCL 4 MG PO TABS: 4.0000 mg | ORAL_TABLET | Freq: Three times a day (TID) | ORAL | 0 refills | Status: DC | PRN

## 2018-05-25 MED ORDER — BORIC ACID POWD: 600.0000 mg | Freq: Every day | 2 refills | Status: AC

## 2018-05-25 MED ORDER — NITROFURANTOIN MONOHYD MACRO 100 MG PO CAPS: 100.0000 mg | ORAL_CAPSULE | Freq: Two times a day (BID) | ORAL | 0 refills | Status: AC

## 2018-05-25 MED ORDER — CLINDAMYCIN HCL 300 MG PO CAPS: 300.0000 mg | ORAL_CAPSULE | Freq: Two times a day (BID) | ORAL | 0 refills | Status: AC

## 2018-05-25 MED ORDER — FLUCONAZOLE 150 MG PO TABS: ORAL_TABLET | ORAL | 0 refills | Status: DC

## 2018-05-25 NOTE — Patient Instructions (Addendum)
Healthy vaginal hygiene practices   -  Avoid sleeper pajamas. Nightgowns allow air to circulate.  Sleep without underpants whenever possible.  -  Wear cotton underpants during the day. Double-rinse underwear after washing to avoid residual irritants. Do not use fabric softeners for underwear and swimsuits.  - Avoid tights, leotards, leggings, "skinny" jeans, and other tight-fitting clothing. Skirts and loose-fitting pants allow air to circulate.  - Avoid pantyliners.  Instead use tampons or cotton pads.  - Daily warm bathing is helpful:     - Soak in clean water (no soap) for 10 to 15 minutes.     - Use soap to wash regions other than the genital area just before getting out of the tub or drain water and take a shower to wash your body. Limit use of any soap on genital areas. Use   fragance-free soaps.     - Rinse the genital area well and gently pat dry.  Don't rub.  Hair dryer to assist with drying can be used only if on cool setting.     - Do not use bubble baths or perfumed soaps.  - Do not use any feminine sprays, douches or powders.  These contain chemicals that will irritate the skin.  - If the genital area is tender or swollen, cool compresses may relieve the discomfort. Unscented wet wipes can be used instead of toilet paper for wiping.   - Emollients, such as Vaseline, may help protect skin and can be applied to the irritated area.  - Always remember to wipe front-to-back after bowel movements. Pat dry after urination.  - Do not sit in wet swimsuits for long periods of time after swimming   Bacterial Vaginosis  Bacterial vaginosis is an infection of the vagina. It happens when too many normal germs (healthy bacteria) grow in the vagina. This infection puts you at risk for infections from sex (STIs). Treating this infection can lower your risk for some STIs. You should also treat this if you are pregnant. It can cause your baby to be born early. Follow these instructions at  home: Medicines  Take over-the-counter and prescription medicines only as told by your doctor.  Take or use your antibiotic medicine as told by your doctor. Do not stop taking or using it even if you start to feel better. General instructions  If you your sexual partner is a woman, tell her that you have this infection. She needs to get treatment if she has symptoms. If you have a female partner, he does not need to be treated.  During treatment: ? Avoid sex. ? Do not douche. ? Avoid alcohol as told. ? Avoid breastfeeding as told.  Drink enough fluid to keep your pee (urine) clear or pale yellow.  Keep your vagina and butt (rectum) clean. ? Wash the area with warm water every day. ? Wipe from front to back after you use the toilet.  Keep all follow-up visits as told by your doctor. This is important. Preventing this condition  Do not douche.  Use only warm water to wash around your vagina.  Use protection when you have sex. This includes: ? Latex condoms. ? Dental dams.  Limit how many people you have sex with. It is best to only have sex with the same person (be monogamous).  Get tested for STIs. Have your partner get tested.  Wear underwear that is cotton or lined with cotton.  Avoid tight pants and pantyhose. This is most important in summer.  Do not use any products that have nicotine or tobacco in them. These include cigarettes and e-cigarettes. If you need help quitting, ask your doctor.  Do not use illegal drugs.  Limit how much alcohol you drink. Contact a doctor if:  Your symptoms do not get better, even after you are treated.  You have more discharge or pain when you pee (urinate).  You have a fever.  You have pain in your belly (abdomen).  You have pain with sex.  Your bleed from your vagina between periods. Summary  This infection happens when too many germs (bacteria) grow in the vagina.  Treating this condition can lower your risk for some  infections from sex (STIs).  You should also treat this if you are pregnant. It can cause early (premature) birth.  Do not stop taking or using your antibiotic medicine even if you start to feel better. This information is not intended to replace advice given to you by your health care provider. Make sure you discuss any questions you have with your health care provider. Document Released: 02/20/2008 Document Revised: 01/27/2016 Document Reviewed: 01/27/2016 Elsevier Interactive Patient Education  2019 [employer]   Enlarged Spleen  An enlarged spleen (splenomegaly) is when the spleen is larger than normal. This condition is usually noticed when the spleen is almost twice its normal size. The spleen is an organ that is located in the upper left area of the abdomen, just under the ribs. The spleen is like a storage unit for red blood cells, and it also works to filter and clean the blood. It destroys cells that are damaged or worn out. The spleen is also important for fighting disease. An enlarged spleen is usually a sign of another health problem. What are the causes? This condition may be caused by:  Mononucleosis and some other viral infections.  Infection with certain bacteria or parasites.  Liver failure (cirrhosis) and other liver diseases.  Blood diseases, such as hemolytic anemia.  An overactive spleen (hypersplenism).  Blood cancers, such as leukemia or Hodgkin disease.  Metabolic disorders, such as Gaucher disease or Niemann-Pick disease.  Tumors and cysts.  Pressure or blood clots in the veins of the spleen.  Connective tissue disorders, such as lupus or rheumatoid arthritis. What are the signs or symptoms? Symptoms of this condition include:  Pain in the upper left part of the abdomen. The pain may spread to the left shoulder or get worse when you take a breath.  Feeling full without eating or after eating only a small amount.  Feeling tired.  Chronic  infections.  Bleeding or bruising easily. In some cases, there are no symptoms. How is this diagnosed? This condition may be diagnosed during a physical exam when the health care provider feels the left upper part of your abdomen. You may also have tests, such as:  Blood tests to check red and white blood cells and other proteins and enzymes.  Imaging tests, such as an abdominal ultrasound, CT scan, or MRI.  Taking a tissue sample (biopsy) of the liver or bone marrow if there is concern that it is the cause of an enlarged spleen. How is this treated? Treatment for this condition depends on the cause. Treatment aims to manage the conditions that cause swelling of the spleen and reduce the size of the spleen. Treatment may include:  Medicines to treat infection or disease.  Radiation therapy.  Blood transfusions.  Vaccinations. If these treatments do not help or if the cause  cannot be found, surgery to remove the spleen (splenectomy) may be recommended. Follow these instructions at home:  Take over-the-counter and prescription medicines only as told by your health care provider.  If you were prescribed an antibiotic medicine, take it as told by your health care provider. Do not stop taking the antibiotic even if you start to feel better.  Follow instructions from your health care provider about limiting your activities. To avoid injury or a ruptured spleen, make sure you: ? Avoid contact sports. ? Wear a seat belt in the car.  Keep all follow-up visits as told by your health care provider. This is important. Contact a health care provider if:  Your symptoms do not improve as expected.  You have a fever or chills.  You feel generally ill.  You have increased pain when you take in a breath. Get help right away if:  You experience an injury or impact to the spleen area.  Your abdominal pain becomes severe.  You feel dizzy or you faint.  You feel very weak.  You have  cold and clammy skin.  You have sweating for no reason.  You have chest pain or difficulty breathing. This information is not intended to replace advice given to you by your health care provider. Make sure you discuss any questions you have with your health care provider. Document Released: 10/31/2009 Document Revised: 10/19/2015 Document Reviewed: 10/31/2014 Elsevier Interactive Patient Education  2019 [employer].

## 2018-05-25 NOTE — Progress Notes (Signed)
Subjective:    Patient ID: Colleen Valentine, female    DOB: [birth year], 22 y.o.   MRN: [id number]  Chief Complaint:  Followup enlarged spleen   HPI: Colleen Valentine is a 22 y.o. female presenting on 05/25/2018 for Followup enlarged spleen  Pt presents today for 1 week follow up of enlarged spleen. After last office visit pt continued to have diffuse abdominal pain and went to [facility] where she was admitted on 05/16/18 and left AMA on 05/18/18. Pt was admitted for intractable nausea and vomiting, enlarged spleen, possible pyelonephritis, and BV. Pt states she was disappointed with the care she received so she left the hospital AMA. Pt states she was told she had a kidney infection but was not treated for it. She had IV rocephin while hospitalized. She states she continues to have dysuria and right flank pain. She denies fever, chills, or confusion. States she does have fatigue. Pt states she was also diagnosed with BV and vaginal yeast infection. States she told the provider she had frequent BV and the flagyl and clindamycin did not help with the BV. She states she still has malodorous vaginal discharge and vaginal pruritis. Denies dyspareunia. Pt states she continues to have intermittent nausea. Denies LUQ abdominal pain. States she does have some suprapubic discomfort after voiding. She had a repeat scan during her hospital stay that still reflected splenomegaly without infarct or rupture.   Relevant past medical, surgical, family, and social history reviewed and updated as indicated.  Allergies and medications reviewed and updated.   Past Medical History:  Diagnosis Date  . Anxiety   . Complication of anesthesia   . Depression   . GERD (gastroesophageal reflux disease)   . Insomnia   . Obesity   . PONV (postoperative nausea and vomiting)   . Seizures ([place])    once in 2016 due to alcohol poisioning  . Suicide [facility])     Past Surgical History:  Procedure Laterality Date  .  CHOLECYSTECTOMY  2015  . COSMETIC SURGERY  Age 67   dog bite to face    Social History   Socioeconomic History  . Marital status: Single    Spouse name: Not on file  . Number of children: Not on file  . Years of education: Not on file  . Highest education level: Not on file  Occupational History  . 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 Financial resource strain: Not on file  . Food insecurity:    Worry: Not on file    Inability: Not on file  . Transportation needs:    Medical: Not on file    Non-medical: Not on file  Tobacco Use  . Smoking status: Current Every Day Smoker    Packs/day: 0.50    Years: 5.00    Pack years: 2.50    Types: Cigarettes  . Smokeless tobacco: Never Used  . Tobacco comment: one pack cigarettes daily  Substance and Sexual Activity  . Alcohol use: Yes    Alcohol/week: 0.0 standard drinks    Comment: social  . Drug use: Yes    Types: Marijuana  . Sexual activity: Not Currently    Partners: Male    Birth control/protection: Other-see comments  Lifestyle  . Physical activity:    Days per week: Not on file    Minutes per session: Not on file  . Stress: Not on file  Relationships  . Social connections:  Talks on phone: Not on file    Gets together: Not on file    Attends religious service: Not on file    Active member of club or organization: Not on file    Attends meetings of clubs or organizations: Not on file    Relationship status: Not on file  . Intimate partner violence:    Fear of current or ex partner: Not on file    Emotionally abused: Not on file    Physically abused: Not on file    Forced sexual activity: Not on file  Other Topics Concern  . Not on file  Social History Narrative   ** Merged History Encounter **        Outpatient Encounter Medications as of 05/25/2018  Medication Sig  . Lactobacillus (PROBIOTIC ACIDOPHILUS PO) Take 1 tablet by mouth daily.  . ondansetron  (ZOFRAN ODT) 8 MG disintegrating tablet Take 1 tablet (8 mg total) by mouth every 8 (eight) hours as needed for nausea or vomiting.  . pantoprazole (PROTONIX) 40 MG tablet Take 1 tablet (40 mg total) by mouth daily before breakfast.  . valACYclovir (VALTREX) 500 MG tablet TAKE 1 TABLET BY MOUTH DAILY.  Marland Kitchen Boric Acid POWD Place 600 mg vaginally at bedtime. Vaginal suppositories  . clindamycin (CLEOCIN) 300 MG capsule Take 1 capsule (300 mg total) by mouth 2 (two) times daily for 7 days.  . fluconazole (DIFLUCAN) 150 MG tablet 1 po q week x 4 weeks  . HYDROcodone-acetaminophen (NORCO/VICODIN) 5-325 MG tablet Take 1 tablet by mouth every 4 (four) hours as needed. (Patient not taking: Reported on 05/15/2018)  . nitrofurantoin, macrocrystal-monohydrate, (MACROBID) 100 MG capsule Take 1 capsule (100 mg total) by mouth 2 (two) times daily for 7 days.  . ondansetron (ZOFRAN) 4 MG tablet Take 1 tablet (4 mg total) by mouth every 8 (eight) hours as needed for nausea or vomiting.   No facility-administered encounter medications on file as of 05/25/2018.     No Known Allergies  Review of Systems  Constitutional: Positive for activity change and fatigue. Negative for chills and fever.  Respiratory: Negative for cough, chest tightness and shortness of breath.   Cardiovascular: Negative for chest pain, palpitations and leg swelling.  Gastrointestinal: Positive for abdominal pain and nausea. Negative for abdominal distention, blood in stool, constipation, diarrhea and vomiting.  Genitourinary: Positive for dysuria, flank pain, frequency, urgency and vaginal discharge. Negative for decreased urine volume, difficulty urinating, dyspareunia, enuresis, genital sores, hematuria, menstrual problem, pelvic pain, vaginal bleeding and vaginal pain.  Musculoskeletal: Negative for myalgias.  Skin: Negative for color change and pallor.  Neurological: Negative for dizziness, weakness, light-headedness and headaches.    Psychiatric/Behavioral: Negative for confusion.  All other systems reviewed and are negative.       Objective:    BP 113/77   Pulse 86   Temp 98.4 F (36.9 C) (Oral)   Ht 5\' 5"  (1.651 m)   Wt 230 lb (104.3 kg)   LMP 05/02/2018   BMI 38.27 kg/m    Wt Readings from Last 3 Encounters:  05/25/18 230 lb (104.3 kg)  05/17/18 218 lb (98.9 kg)  05/15/18 231 lb (104.8 kg)    Physical Exam Vitals signs and nursing note reviewed.  Constitutional:      General: She is not in acute distress.    Appearance: Normal appearance. She is well-developed and well-groomed. She is obese. She is not ill-appearing.  HENT:     Head: Normocephalic and atraumatic.  Nose: Nose normal.     Mouth/Throat:     Mouth: Mucous membranes are moist.     Pharynx: Oropharynx is clear.  Eyes:     Conjunctiva/sclera: Conjunctivae normal.     Pupils: Pupils are equal, round, and reactive to light.  Neck:     Musculoskeletal: Neck supple.  Cardiovascular:     Rate and Rhythm: Normal rate and regular rhythm.     Heart sounds: Normal heart sounds. No murmur. No friction rub. No gallop.   Pulmonary:     Effort: Pulmonary effort is normal. No respiratory distress.     Breath sounds: Normal breath sounds.  Abdominal:     General: Abdomen is protuberant. Bowel sounds are normal. There is no distension.     Palpations: Abdomen is soft.     Tenderness: There is abdominal tenderness (mild) in the suprapubic area. There is no right CVA tenderness, left CVA tenderness, guarding or rebound. Negative signs include Murphy's sign and McBurney's sign.     Hernia: No hernia is present.     Comments: Unable to palpate spleen, no guarding when attempting to palpate spleen.   Genitourinary:    Comments: Pt decline pelvic exam and performed self wet prep  Lymphadenopathy:     Cervical: No cervical adenopathy.     Upper Body:     Right upper body: No supraclavicular adenopathy.     Left upper body: No supraclavicular  adenopathy.  Skin:    General: Skin is warm and dry.     Capillary Refill: Capillary refill takes less than 2 seconds.     Coloration: Skin is not pale.  Neurological:     General: No focal deficit present.     Mental Status: She is alert and oriented to person, place, and time.  Psychiatric:        Attention and Perception: Attention and perception normal.        Mood and Affect: Mood and affect normal.        Speech: Speech normal.        Behavior: Behavior normal. Behavior is cooperative.        Thought Content: Thought content normal.        Cognition and Memory: Cognition and memory normal.        Judgment: Judgment normal.     Results for orders placed or performed during the hospital encounter of 05/16/18  Wet prep, genital  Result Value Ref Range   Yeast Wet Prep HPF POC PRESENT (A) NONE SEEN   Trich, Wet Prep NONE SEEN NONE SEEN   Clue Cells Wet Prep HPF POC PRESENT (A) NONE SEEN   WBC, Wet Prep HPF POC MANY (A) NONE SEEN   Sperm NONE SEEN   Urine culture  Result Value Ref Range   Specimen Description URINE, RANDOM    Special Requests NONE    Culture (A)     <10,000 COLONIES/mL INSIGNIFICANT GROWTH Performed at [facility], [address], [place], [place] [address]    Report Status 05/18/2018 FINAL   CBC with Differential  Result Value Ref Range   WBC 4.6 4.0 - 10.5 K/uL   RBC 4.15 3.87 - 5.11 MIL/uL   Hemoglobin 12.4 12.0 - 15.0 g/dL   HCT 38.4 36.0 - 46.0 %   MCV 92.5 80.0 - 100.0 fL   MCH 29.9 26.0 - 34.0 pg   MCHC 32.3 30.0 - 36.0 g/dL   RDW 13.1 11.5 - 15.5 %  Platelets 146 (L) 150 - 400 K/uL   nRBC 0.0 0.0 - 0.2 %   Neutrophils Relative % 45 %   Neutro Abs 2.1 1.7 - 7.7 K/uL   Lymphocytes Relative 45 %   Lymphs Abs 2.1 0.7 - 4.0 K/uL   Monocytes Relative 6 %   Monocytes Absolute 0.3 0.1 - 1.0 K/uL   Eosinophils Relative 2 %   Eosinophils Absolute 0.1 0.0 - 0.5 K/uL   Basophils Relative 1 %   Basophils Absolute 0.0 0.0 - 0.1 K/uL    Immature Granulocytes 1 %   Abs Immature Granulocytes 0.05 0.00 - 0.07 K/uL  Comprehensive metabolic panel  Result Value Ref Range   Sodium 141 135 - 145 mmol/L   Potassium 3.5 3.5 - 5.1 mmol/L   Chloride 106 98 - 111 mmol/L   CO2 27 22 - 32 mmol/L   Glucose, Bld 102 (H) 70 - 99 mg/dL   BUN 5 (L) 6 - 20 mg/dL   Creatinine, Ser 0.82 0.44 - 1.00 mg/dL   Calcium 9.1 8.9 - 10.3 mg/dL   Total Protein 6.5 6.5 - 8.1 g/dL   Albumin 3.4 (L) 3.5 - 5.0 g/dL   AST 52 (H) 15 - 41 U/L   ALT 64 (H) 0 - 44 U/L   Alkaline Phosphatase 67 38 - 126 U/L   Total Bilirubin 0.8 0.3 - 1.2 mg/dL   GFR calc non Af Amer >60 >60 mL/min   GFR calc Af Amer >60 >60 mL/min   Anion gap 8 5 - 15  Lipase, blood  Result Value Ref Range   Lipase 26 11 - 51 U/L  Urinalysis, Routine w reflex microscopic  Result Value Ref Range   Color, Urine AMBER (A) YELLOW   APPearance CLOUDY (A) CLEAR   Specific Gravity, Urine 1.025 1.005 - 1.030   pH 6.5 5.0 - 8.0   Glucose, UA NEGATIVE NEGATIVE mg/dL   Hgb urine dipstick NEGATIVE NEGATIVE   Bilirubin Urine MODERATE (A) NEGATIVE   Ketones, ur 15 (A) NEGATIVE mg/dL   Protein, ur NEGATIVE NEGATIVE mg/dL   Nitrite POSITIVE (A) NEGATIVE   Leukocytes, UA MODERATE (A) NEGATIVE  Urinalysis, Microscopic (reflex)  Result Value Ref Range   RBC / HPF 6-10 0 - 5 RBC/hpf   WBC, UA 6-10 0 - 5 WBC/hpf   Bacteria, UA MANY (A) NONE SEEN   Squamous Epithelial / LPF 11-20 0 - 5   Mucus PRESENT    Budding Yeast PRESENT    Ca Oxalate Crys, UA PRESENT   RPR  Result Value Ref Range   RPR Ser Ql Non Reactive Non Reactive  HIV antibody  Result Value Ref Range   HIV Screen 4th Generation wRfx Non Reactive Non Reactive  Comprehensive metabolic panel  Result Value Ref Range   Sodium 141 135 - 145 mmol/L   Potassium 3.4 (L) 3.5 - 5.1 mmol/L   Chloride 105 98 - 111 mmol/L   CO2 28 22 - 32 mmol/L   Glucose, Bld 104 (H) 70 - 99 mg/dL   BUN 5 (L) 6 - 20 mg/dL   Creatinine, Ser 0.84 0.44 -  1.00 mg/dL   Calcium 8.4 (L) 8.9 - 10.3 mg/dL   Total Protein 5.7 (L) 6.5 - 8.1 g/dL   Albumin 3.0 (L) 3.5 - 5.0 g/dL   AST 47 (H) 15 - 41 U/L   ALT 60 (H) 0 - 44 U/L   Alkaline Phosphatase 58 38 - 126 U/L   Total  Bilirubin 0.8 0.3 - 1.2 mg/dL   GFR calc non Af Amer >60 >60 mL/min   GFR calc Af Amer >60 >60 mL/min   Anion gap 8 5 - 15  CBC  Result Value Ref Range   WBC 3.1 (L) 4.0 - 10.5 K/uL   RBC 3.72 (L) 3.87 - 5.11 MIL/uL   Hemoglobin 11.1 (L) 12.0 - 15.0 g/dL   HCT 34.9 (L) 36.0 - 46.0 %   MCV 93.8 80.0 - 100.0 fL   MCH 29.8 26.0 - 34.0 pg   MCHC 31.8 30.0 - 36.0 g/dL   RDW 13.3 11.5 - 15.5 %   Platelets 130 (L) 150 - 400 K/uL   nRBC 0.0 0.0 - 0.2 %  Basic metabolic panel  Result Value Ref Range   Sodium 142 135 - 145 mmol/L   Potassium 3.7 3.5 - 5.1 mmol/L   Chloride 108 98 - 111 mmol/L   CO2 23 22 - 32 mmol/L   Glucose, Bld 107 (H) 70 - 99 mg/dL   BUN <5 (L) 6 - 20 mg/dL   Creatinine, Ser 0.65 0.44 - 1.00 mg/dL   Calcium 8.3 (L) 8.9 - 10.3 mg/dL   GFR calc non Af Amer >60 >60 mL/min   GFR calc Af Amer >60 >60 mL/min   Anion gap 11 5 - 15  CBC  Result Value Ref Range   WBC 3.5 (L) 4.0 - 10.5 K/uL   RBC 3.43 (L) 3.87 - 5.11 MIL/uL   Hemoglobin 10.6 (L) 12.0 - 15.0 g/dL   HCT 31.9 (L) 36.0 - 46.0 %   MCV 93.0 80.0 - 100.0 fL   MCH 30.9 26.0 - 34.0 pg   MCHC 33.2 30.0 - 36.0 g/dL   RDW 13.2 11.5 - 15.5 %   Platelets 121 (L) 150 - 400 K/uL   nRBC 0.0 0.0 - 0.2 %  I-Stat beta hCG blood, ED  Result Value Ref Range   I-stat hCG, quantitative <5.0 <5 mIU/mL   Comment 3          I-Stat CG4 Lactic Acid, ED  Result Value Ref Range   Lactic Acid, Venous 0.95 0.5 - 1.9 mmol/L  I-Stat CG4 Lactic Acid, ED  Result Value Ref Range   Lactic Acid, Venous 0.75 0.5 - 1.9 mmol/L  GC/Chlamydia probe amp ([place])not at [facility]  Result Value Ref Range   Chlamydia Negative    Neisseria gonorrhea Negative   Cervicovaginal ancillary only  Result Value Ref Range   Chlamydia  Negative    Neisseria gonorrhea Negative    Wet Prep: Positive for clue cells, negative for yeast and trich. Urinalysis: Moderate bacteria, negative nitrites, trace ketones, trace protein.  Pertinent labs & imaging results that were available during my care of the patient were reviewed by me and considered in my medical decision making.  Assessment & Plan:  Colleen Valentine was seen today for followup enlarged spleen.  Diagnoses and all orders for this visit:  Spleen enlarged Will repeat EBV titers in [redacted] weeks along with CBC and liver enzymes. Report any new or worsening symptoms.   Vaginal discharge -     Chlamydia/Gonococcus/Trichomonas, NAA -     WET PREP FOR TRICH, YEAST, CLUE  Dysuria -     Chlamydia/Gonococcus/Trichomonas, NAA -     Urinalysis, Routine w reflex microscopic -     nitrofurantoin, macrocrystal-monohydrate, (MACROBID) 100 MG capsule; Take 1 capsule (100 mg total) by mouth 2 (two) times daily for 7 days. -  Urine Culture  Nausea and vomiting in adult [place] or BRAT diet, advance as tolerated.  -     ondansetron (ZOFRAN) 4 MG tablet; Take 1 tablet (4 mg total) by mouth every 8 (eight) hours as needed for nausea or vomiting.  Bacterial vaginosis Recurrent BV. Will treat with one week of Cleocin followed by 30 days of Boric Acid vaginal suppositories. Healthy vaginal hygiene discussed. Report any new or worsening symptoms.  -     clindamycin (CLEOCIN) 300 MG capsule; Take 1 capsule (300 mg total) by mouth 2 (two) times daily for 7 days. -     Boric Acid POWD; Place 600 mg vaginally at bedtime. Vaginal suppositories  Vaginal candidiasis Take on day 3 of antibiotics and then repeat 2 days after completion of antibiotic therapy.  -     fluconazole (DIFLUCAN) 150 MG tablet; 1 po q week x 4 weeks  History of mononucleosis Will repeat EBV titers in 4 weeks.   Acute cystitis without hematuria Increase water intake. Avoid bladder irritants. Medications as prescribed. Culture  pending.  -     Urinalysis, Routine w reflex microscopic -     nitrofurantoin, macrocrystal-monohydrate, (MACROBID) 100 MG capsule; Take 1 capsule (100 mg total) by mouth 2 (two) times daily for 7 days. -     Urine Culture   Continue all other maintenance medications.  Follow up plan: Return in about 4 weeks (around 06/22/2018), or if symptoms worsen or fail to improve.  Educational handout given for healthy vaginal hygiene, enlarged spleen, BV  The above assessment and management plan was discussed with the patient. The patient verbalized understanding of and has agreed to the management plan. Patient is aware to call the clinic if symptoms persist or worsen. Patient is aware when to return to the clinic for a follow-up visit. Patient educated on when it is appropriate to go to the emergency department.   Monia Pouch, FNP-C [facility] [phone]

## 2018-05-26 LAB — URINE CULTURE: Organism ID, Bacteria: NO GROWTH

## 2018-05-27 LAB — CHLAMYDIA/GONOCOCCUS/TRICHOMONAS, NAA
Chlamydia by NAA: NEGATIVE
Gonococcus by NAA: NEGATIVE
Trich vag by NAA: NEGATIVE

## 2018-05-28 NOTE — Telephone Encounter (Signed)
lmtcb for flu shot 

## 2018-05-29 ENCOUNTER — Encounter: Payer: Self-pay | Admitting: Family

## 2018-06-04 ENCOUNTER — Telehealth: Payer: Self-pay

## 2018-06-04 NOTE — Telephone Encounter (Signed)
Writer left a Designer, television/film set message.  Patient has a PHQ score of 20 on the [place] Report

## 2018-06-16 ENCOUNTER — Telehealth: Payer: Self-pay

## 2018-06-16 NOTE — Telephone Encounter (Signed)
2nd attempt - [facility]   

## 2018-06-25 ENCOUNTER — Encounter: Payer: Self-pay | Admitting: Family Medicine

## 2018-06-25 ENCOUNTER — Ambulatory Visit (INDEPENDENT_AMBULATORY_CARE_PROVIDER_SITE_OTHER): Payer: Medicaid Other | Admitting: Family Medicine

## 2018-06-25 VITALS — BP 114/65 | HR 85 | Temp 99.4°F | Ht 65.0 in | Wt 236.0 lb

## 2018-06-25 DIAGNOSIS — Z8619 Personal history of other infectious and parasitic diseases: Secondary | ICD-10-CM | POA: Diagnosis not present

## 2018-06-25 DIAGNOSIS — N760 Acute vaginitis: Secondary | ICD-10-CM | POA: Diagnosis not present

## 2018-06-25 DIAGNOSIS — B9689 Other specified bacterial agents as the cause of diseases classified elsewhere: Secondary | ICD-10-CM | POA: Diagnosis not present

## 2018-06-25 DIAGNOSIS — Z7251 High risk heterosexual behavior: Secondary | ICD-10-CM | POA: Diagnosis not present

## 2018-06-25 DIAGNOSIS — R161 Splenomegaly, not elsewhere classified: Secondary | ICD-10-CM

## 2018-06-25 DIAGNOSIS — A6004 Herpesviral vulvovaginitis: Secondary | ICD-10-CM

## 2018-06-25 DIAGNOSIS — N898 Other specified noninflammatory disorders of vagina: Secondary | ICD-10-CM

## 2018-06-25 DIAGNOSIS — K219 Gastro-esophageal reflux disease without esophagitis: Secondary | ICD-10-CM | POA: Diagnosis not present

## 2018-06-25 LAB — WET PREP FOR TRICH, YEAST, CLUE
Clue Cell Exam: POSITIVE — AB
Trichomonas Exam: NEGATIVE
Yeast Exam: NEGATIVE

## 2018-06-25 LAB — PREGNANCY, URINE: Preg Test, Ur: NEGATIVE

## 2018-06-25 MED ORDER — SECNIDAZOLE 2 G PO PACK: 2.0000 g | PACK | Freq: Once | ORAL | 1 refills | Status: AC

## 2018-06-25 MED ORDER — VALACYCLOVIR HCL 500 MG PO TABS: 500.0000 mg | ORAL_TABLET | Freq: Every day | ORAL | 2 refills | Status: DC

## 2018-06-25 MED ORDER — PANTOPRAZOLE SODIUM 40 MG PO TBEC: 40.0000 mg | DELAYED_RELEASE_TABLET | Freq: Every day | ORAL | 5 refills | Status: DC

## 2018-06-25 NOTE — Patient Instructions (Signed)
Healthy vaginal hygiene practices   -  Avoid sleeper pajamas. Nightgowns allow air to circulate.  Sleep without underpants whenever possible.  -  Wear cotton underpants during the day. Double-rinse underwear after washing to avoid residual irritants. Do not use fabric softeners for underwear and swimsuits.  - Avoid tights, leotards, leggings, "skinny" jeans, and other tight-fitting clothing. Skirts and loose-fitting pants allow air to circulate.  - Avoid pantyliners.  Instead use tampons or cotton pads.  - Daily warm bathing is helpful:     - Soak in clean water (no soap) for 10 to 15 minutes.     - Use soap to wash regions other than the genital area just before getting out of the tub or drain water and take a shower to wash your body. Limit use of any soap on genital areas. Use   fragance-free soaps.     - Rinse the genital area well and gently pat dry.  Don't rub.  Hair dryer to assist with drying can be used only if on cool setting.     - Do not use bubble baths or perfumed soaps.  - Do not use any feminine sprays, douches or powders.  These contain chemicals that will irritate the skin.  - If the genital area is tender or swollen, cool compresses may relieve the discomfort. Unscented wet wipes can be used instead of toilet paper for wiping.   - Emollients, such as Vaseline, may help protect skin and can be applied to the irritated area.  - Always remember to wipe front-to-back after bowel movements. Pat dry after urination.  - Do not sit in wet swimsuits for long periods of time after swimming  

## 2018-06-25 NOTE — Progress Notes (Signed)
Subjective:    Patient ID: Colleen Valentine, female    DOB: [birth year], 23 y.o.   MRN: [id number]  Chief Complaint:  Follow-up and Pregnancy test, unprotected sex   HPI: Colleen Valentine is a 23 y.o. female presenting on 06/25/2018 for Follow-up and Pregnancy test, unprotected sex  Pt presents today for enlarged spleen follow up and screening for pregnancy and STI's. Pt states her LMP was 05/10/2018. States she has had unprotected sex since this time. States she continues to have the vaginal discharge. States she took the antibiotics as prescribed but did not get the boric acid because her insurance did not cover the cost. States she still has the malodorous discharge. She denies pruritis. She denies abdominal pain, dyspareunia, or vaginal bleeding. States she does want a refill on her Valtrex and Protonix. She reports her GERD is well controlled with the Protonix. She denies abdominal pain, cough, sore throat, or hemoptysis. She denies a current herpes outbreak. States she would like the medications to prevent an outbreak. States she has not had an outbreak in a while. She states the abdominal pain from her enlarged spleen has subsided. Denies fever, chills, nausea, vomiting, or abnormal bleeding or bruising.   Relevant past medical, surgical, family, and social history reviewed and updated as indicated.  Allergies and medications reviewed and updated.   Past Medical History:  Diagnosis Date  . Anxiety   . Complication of anesthesia   . Depression   . GERD (gastroesophageal reflux disease)   . Insomnia   . Obesity   . PONV (postoperative nausea and vomiting)   . 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 Suicide [facility])     Past Surgical History:  Procedure Laterality Date  . CHOLECYSTECTOMY  2015  . COSMETIC SURGERY  Age 15   dog bite to face    Social History   Socioeconomic History  . Marital status: Single    Spouse name: Not on file  . Number of children: Not  on file  . Years of education: Not on file  . Highest education level: Not on file  Occupational History  . 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 Financial resource strain: Not on file  . Food insecurity:    Worry: Not on file    Inability: Not on file  . Transportation needs:    Medical: Not on file    Non-medical: Not on file  Tobacco Use  . Smoking status: Current Every Day Smoker    Packs/day: 0.50    Years: 5.00    Pack years: 2.50    Types: Cigarettes  . Smokeless tobacco: Never Used  . Tobacco comment: one pack cigarettes daily  Substance and Sexual Activity  . Alcohol use: Yes    Alcohol/week: 0.0 standard drinks    Comment: social  . Drug use: Yes    Types: Marijuana  . Sexual activity: Not Currently    Partners: Male    Birth control/protection: Other-see comments  Lifestyle  . Physical activity:    Days per week: Not on file    Minutes per session: Not on file  . Stress: Not on file  Relationships  . Social connections:    Talks on phone: Not on file    Gets together: Not on file    Attends religious service: Not on file    Active member of club or organization: Not  on file    Attends meetings of clubs or organizations: Not on file    Relationship status: Not on file  . Intimate partner violence:    Fear of current or ex partner: Not on file    Emotionally abused: Not on file    Physically abused: Not on file    Forced sexual activity: Not on file  Other Topics Concern  . Not on file  Social History Narrative   ** Merged History Encounter **        Outpatient Encounter Medications as of 06/25/2018  Medication Sig  . fluconazole (DIFLUCAN) 150 MG tablet 1 po q week x 4 weeks  . Lactobacillus (PROBIOTIC ACIDOPHILUS PO) Take 1 tablet by mouth daily.  . ondansetron (ZOFRAN) 4 MG tablet Take 1 tablet (4 mg total) by mouth every 8 (eight) hours as needed for nausea or vomiting.  . pantoprazole (PROTONIX) 40  MG tablet Take 1 tablet (40 mg total) by mouth daily before breakfast.  . valACYclovir (VALTREX) 500 MG tablet Take 1 tablet (500 mg total) by mouth daily.  . [DISCONTINUED] pantoprazole (PROTONIX) 40 MG tablet Take 1 tablet (40 mg total) by mouth daily before breakfast.  . [DISCONTINUED] valACYclovir (VALTREX) 500 MG tablet TAKE 1 TABLET BY MOUTH DAILY.  Marland Kitchen Secnidazole (SOLOSEC) 2 g PACK Take 2 g by mouth once for 1 dose.  . [DISCONTINUED] HYDROcodone-acetaminophen (NORCO/VICODIN) 5-325 MG tablet Take 1 tablet by mouth every 4 (four) hours as needed. (Patient not taking: Reported on 05/15/2018)  . [DISCONTINUED] ondansetron (ZOFRAN ODT) 8 MG disintegrating tablet Take 1 tablet (8 mg total) by mouth every 8 (eight) hours as needed for nausea or vomiting.   No facility-administered encounter medications on file as of 06/25/2018.     No Known Allergies  Review of Systems  Constitutional: Negative for chills, fatigue and fever.  Respiratory: Negative for cough, chest tightness, shortness of breath and wheezing.   Cardiovascular: Negative for chest pain, palpitations and leg swelling.  Gastrointestinal: Negative for abdominal distention, abdominal pain, anal bleeding, blood in stool, constipation, diarrhea, nausea, rectal pain and vomiting.  Genitourinary: Positive for menstrual problem and vaginal discharge. Negative for decreased urine volume, difficulty urinating, dyspareunia, flank pain, frequency, genital sores, hematuria, pelvic pain, vaginal bleeding and vaginal pain.  Musculoskeletal: Negative for arthralgias, gait problem and myalgias.  Neurological: Negative for dizziness, weakness, light-headedness and headaches.  Psychiatric/Behavioral: Negative for confusion.  All other systems reviewed and are negative.       Objective:    BP 114/65   Pulse 85   Temp 99.4 F (37.4 C) (Oral)   Ht _0  (1.651 m)   Wt 236 lb (107 kg)   BMI 39.27 kg/m    Wt Readings from Last 3 Encounters:    06/25/18 236 lb (107 kg)  05/25/18 230 lb (104.3 kg)  05/17/18 218 lb (98.9 kg)    Physical Exam Vitals signs and nursing note reviewed.  Constitutional:      General: She is not in acute distress.    Appearance: Normal appearance. She is well-developed and well-groomed. She is not ill-appearing or toxic-appearing.  HENT:     Head: Normocephalic and atraumatic.     Right Ear: Tympanic membrane, ear canal and external ear normal.     Left Ear: Tympanic membrane, ear canal and external ear normal.     Nose: Nose normal.     Mouth/Throat:     Mouth: Mucous membranes are moist.     Pharynx:  Oropharynx is clear.  Eyes:     Conjunctiva/sclera: Conjunctivae normal.     Pupils: Pupils are equal, round, and reactive to light.  Neck:     Musculoskeletal: Normal range of motion and neck supple.  Cardiovascular:     Rate and Rhythm: Normal rate and regular rhythm.     Heart sounds: Normal heart sounds. No murmur. No friction rub. No gallop.   Pulmonary:     Effort: Pulmonary effort is normal.     Breath sounds: Normal breath sounds.  Abdominal:     General: Abdomen is protuberant. Bowel sounds are normal. There is no distension.     Palpations: Abdomen is soft.     Tenderness: There is no abdominal tenderness. There is no right CVA tenderness, left CVA tenderness, guarding or rebound. Negative signs include Murphy's sign and McBurney's sign.     Hernia: No hernia is present.     Comments: Due to girth of abdomen, unable to palpate spleen  Genitourinary:    Comments: Pt declined pelvic, performed self wet prep Skin:    General: Skin is warm and dry.     Capillary Refill: Capillary refill takes less than 2 seconds.  Neurological:     General: No focal deficit present.     Mental Status: She is alert and oriented to person, place, and time.  Psychiatric:        Mood and Affect: Mood normal.        Behavior: Behavior normal. Behavior is cooperative.        Thought Content: Thought  content normal.        Judgment: Judgment normal.     Results for orders placed or performed in visit on 05/25/18  Chlamydia/Gonococcus/Trichomonas, NAA  Result Value Ref Range   Chlamydia by NAA Negative Negative   Gonococcus by NAA Negative Negative   Trich vag by NAA Negative Negative  WET PREP FOR TRICH, YEAST, CLUE  Result Value Ref Range   Trichomonas Exam Negative Negative   Yeast Exam Negative Negative   Clue Cell Exam Positive (A) Negative  Urine Culture  Result Value Ref Range   Urine Culture, Routine Final report    Organism ID, Bacteria No growth   Microscopic Examination  Result Value Ref Range   WBC, UA 6-10 (A) 0 - 5 /hpf   RBC, UA 0-2 0 - 2 /hpf   Epithelial Cells (non renal) 0-10 0 - 10 /hpf   Renal Epithel, UA None seen None seen /hpf   Mucus, UA Present Not Estab.   Bacteria, UA Moderate (A) None seen/Few  Urinalysis, Routine w reflex microscopic  Result Value Ref Range   Specific Gravity, UA >1.030 (H) 1.005 - 1.030   pH, UA 6.0 5.0 - 7.5   Color, UA Yellow Yellow   Appearance Ur Cloudy (A) Clear   Leukocytes, UA Negative Negative   Protein, UA Trace (A) Negative/Trace   Glucose, UA Negative Negative   Ketones, UA Trace (A) Negative   RBC, UA Negative Negative   Bilirubin, UA Negative Negative   Urobilinogen, Ur 1.0 0.2 - 1.0 mg/dL   Nitrite, UA Negative Negative   Microscopic Examination See below:      Urine pregnancy negative.  Wet prep: many bacteria, positive for clue cells, negative for yeast and trich.   Pertinent labs & imaging results that were available during my care of the patient were reviewed by me and considered in my medical decision making.  Assessment & Plan:  Ayani was seen today for follow-up and pregnancy test, unprotected sex.  Diagnoses and all orders for this visit:  Unprotected sex STI screening and pregnancy completed. Urine pregnancy negative. Wet prep positive for clue cells. -     Pregnancy, urine -      Chlamydia/Gonococcus/Trichomonas, NAA -     WET PREP FOR TRICH, YEAST, CLUE -     HIV Antibody (routine testing w rflx)  Spleen enlarged No abdominal pain. Labs pending.  -     CBC with Differential/Platelet -     CMP14+EGFR -     Epstein-Barr virus VCA antibody panel  History of mononucleosis Labs today to see if titers are still elevated.  -     CBC with Differential/Platelet -     CMP14+EGFR -     Epstein-Barr virus VCA antibody panel  Vaginal discharge -     Chlamydia/Gonococcus/Trichomonas, NAA -     WET PREP FOR TRICH, YEAST, CLUE -     HIV Antibody (routine testing w rflx)  Gastroesophageal reflux disease without esophagitis Avoid spicy and greasy foods. Avoid caffeine, chocolate, or alcohol. Medications as prescribed.  -     pantoprazole (PROTONIX) 40 MG tablet; Take 1 tablet (40 mg total) by mouth daily before breakfast.  Herpes simplex vulvovaginitis -     valACYclovir (VALTREX) 500 MG tablet; Take 1 tablet (500 mg total) by mouth daily.  Bacterial vaginosis Will try Solosec as flagyl and clindamycin have been unsuccessful in treatment. Due to recurrent BV, will refer to GYN. Vaginal hygiene discussed.  -     Secnidazole (SOLOSEC) 2 g PACK; Take 2 g by mouth once for 1 dose. -     Ambulatory referral to Obstetrics / Gynecology     Continue all other maintenance medications.  Follow up plan: Return in about 6 months (around 12/24/2018), or if symptoms worsen or fail to improve.  Educational handout given for vaginal hygiene  The above assessment and management plan was discussed with the patient. The patient verbalized understanding of and has agreed to the management plan. Patient is aware to call the clinic if symptoms persist or worsen. Patient is aware when to return to the clinic for a follow-up visit. Patient educated on when it is appropriate to go to the emergency department.   Monia Pouch, FNP-C [facility] [phone]

## 2018-06-26 LAB — EPSTEIN-BARR VIRUS VCA ANTIBODY PANEL
EBV Early Antigen Ab, IgG: 11.8 U/mL — ABNORMAL HIGH (ref 0.0–8.9)
EBV NA IgG: 248 U/mL — ABNORMAL HIGH (ref 0.0–17.9)
EBV VCA IgG: >600 U/mL — ABNORMAL HIGH (ref 0.0–17.9)
EBV VCA IgM: <36 U/mL (ref 0.0–35.9)

## 2018-06-26 LAB — CBC WITH DIFFERENTIAL/PLATELET
Basophils Absolute: 0 10*3/uL (ref 0.0–0.2)
Basos: 1 %
EOS (ABSOLUTE): 0.3 10*3/uL (ref 0.0–0.4)
Eos: 4 %
Hematocrit: 37.9 % (ref 34.0–46.6)
Hemoglobin: 12.8 g/dL (ref 11.1–15.9)
Immature Grans (Abs): 0 10*3/uL (ref 0.0–0.1)
Immature Granulocytes: 0 %
Lymphocytes Absolute: 2.4 10*3/uL (ref 0.7–3.1)
Lymphs: 36 %
MCH: 31.7 pg (ref 26.6–33.0)
MCHC: 33.8 g/dL (ref 31.5–35.7)
MCV: 94 fL (ref 79–97)
Monocytes Absolute: 0.5 10*3/uL (ref 0.1–0.9)
Monocytes: 7 %
Neutrophils Absolute: 3.5 10*3/uL (ref 1.4–7.0)
Neutrophils: 52 %
Platelets: 238 10*3/uL (ref 150–450)
RBC: 4.04 x10E6/uL (ref 3.77–5.28)
RDW: 12.2 % (ref 11.7–15.4)
WBC: 6.8 10*3/uL (ref 3.4–10.8)

## 2018-06-26 LAB — CMP14+EGFR
ALT: 18 IU/L (ref 0–32)
AST: 13 IU/L (ref 0–40)
Albumin/Globulin Ratio: 1.5 (ref 1.2–2.2)
Albumin: 4.1 g/dL (ref 3.9–5.0)
Alkaline Phosphatase: 65 IU/L (ref 39–117)
BUN/Creatinine Ratio: 13 (ref 9–23)
BUN: 9 mg/dL (ref 6–20)
Bilirubin Total: 0.3 mg/dL (ref 0.0–1.2)
CO2: 23 mmol/L (ref 20–29)
Calcium: 9.5 mg/dL (ref 8.7–10.2)
Chloride: 105 mmol/L (ref 96–106)
Creatinine, Ser: 0.72 mg/dL (ref 0.57–1.00)
GFR calc Af Amer: 139 mL/min/{1.73_m2} (ref >59)
GFR calc non Af Amer: 121 mL/min/{1.73_m2} (ref >59)
Globulin, Total: 2.7 g/dL (ref 1.5–4.5)
Glucose: 84 mg/dL (ref 65–99)
Potassium: 5 mmol/L (ref 3.5–5.2)
Sodium: 141 mmol/L (ref 134–144)
Total Protein: 6.8 g/dL (ref 6.0–8.5)

## 2018-06-26 LAB — HIV ANTIBODY (ROUTINE TESTING W REFLEX): HIV Screen 4th Generation wRfx: NONREACTIVE

## 2018-06-29 LAB — CHLAMYDIA/GONOCOCCUS/TRICHOMONAS, NAA
Chlamydia by NAA: NEGATIVE
Gonococcus by NAA: NEGATIVE
Trich vag by NAA: NEGATIVE

## 2018-06-30 ENCOUNTER — Encounter: Payer: Self-pay | Admitting: *Deleted

## 2018-07-07 DIAGNOSIS — N898 Other specified noninflammatory disorders of vagina: Secondary | ICD-10-CM | POA: Diagnosis not present

## 2018-07-07 DIAGNOSIS — Z113 Encounter for screening for infections with a predominantly sexual mode of transmission: Secondary | ICD-10-CM | POA: Diagnosis not present

## 2018-07-07 DIAGNOSIS — N912 Amenorrhea, unspecified: Secondary | ICD-10-CM | POA: Diagnosis not present

## 2018-07-11 ENCOUNTER — Inpatient Hospital Stay (HOSPITAL_COMMUNITY)
Admission: AD | Admit: 2018-07-11 | Discharge: 2018-07-11 | Disposition: A | Discharge status: Home / Self Care | Payer: Medicaid Other | Attending: Obstetrics and Gynecology | Admitting: Obstetrics and Gynecology

## 2018-07-11 ENCOUNTER — Inpatient Hospital Stay (HOSPITAL_COMMUNITY): Payer: Medicaid Other

## 2018-07-11 ENCOUNTER — Encounter (HOSPITAL_COMMUNITY): Payer: Self-pay | Admitting: *Deleted

## 2018-07-11 DIAGNOSIS — R1030 Lower abdominal pain, unspecified: Secondary | ICD-10-CM | POA: Diagnosis present

## 2018-07-11 DIAGNOSIS — N760 Acute vaginitis: Secondary | ICD-10-CM

## 2018-07-11 DIAGNOSIS — B9689 Other specified bacterial agents as the cause of diseases classified elsewhere: Secondary | ICD-10-CM | POA: Insufficient documentation

## 2018-07-11 DIAGNOSIS — O26891 Other specified pregnancy related conditions, first trimester: Secondary | ICD-10-CM

## 2018-07-11 DIAGNOSIS — Z3A01 Less than 8 weeks gestation of pregnancy: Secondary | ICD-10-CM | POA: Diagnosis not present

## 2018-07-11 DIAGNOSIS — O99331 Smoking (tobacco) complicating pregnancy, first trimester: Secondary | ICD-10-CM | POA: Diagnosis not present

## 2018-07-11 DIAGNOSIS — R109 Unspecified abdominal pain: Secondary | ICD-10-CM | POA: Diagnosis not present

## 2018-07-11 DIAGNOSIS — F17210 Nicotine dependence, cigarettes, uncomplicated: Secondary | ICD-10-CM | POA: Diagnosis not present

## 2018-07-11 DIAGNOSIS — Z3491 Encounter for supervision of normal pregnancy, unspecified, first trimester: Secondary | ICD-10-CM

## 2018-07-11 DIAGNOSIS — O23591 Infection of other part of genital tract in pregnancy, first trimester: Secondary | ICD-10-CM | POA: Insufficient documentation

## 2018-07-11 LAB — URINALYSIS, ROUTINE W REFLEX MICROSCOPIC
Bilirubin Urine: NEGATIVE
Glucose, UA: NEGATIVE mg/dL
Hgb urine dipstick: NEGATIVE
Ketones, ur: NEGATIVE mg/dL
Nitrite: NEGATIVE
Protein, ur: NEGATIVE mg/dL
Specific Gravity, Urine: 1.015 (ref 1.005–1.030)
pH: 7 (ref 5.0–8.0)

## 2018-07-11 LAB — HCG, QUANTITATIVE, PREGNANCY: hCG, Beta Chain, Quant, S: 24850 m[IU]/mL — ABNORMAL HIGH (ref <5)

## 2018-07-11 LAB — CBC
HCT: 41.3 % (ref 36.0–46.0)
Hemoglobin: 14.1 g/dL (ref 12.0–15.0)
MCH: 32 pg (ref 26.0–34.0)
MCHC: 34.1 g/dL (ref 30.0–36.0)
MCV: 93.9 fL (ref 80.0–100.0)
Platelets: 195 10*3/uL (ref 150–400)
RBC: 4.4 MIL/uL (ref 3.87–5.11)
RDW: 12.8 % (ref 11.5–15.5)
WBC: 6.7 10*3/uL (ref 4.0–10.5)
nRBC: 0 % (ref 0.0–0.2)

## 2018-07-11 LAB — URINALYSIS, MICROSCOPIC (REFLEX)

## 2018-07-11 LAB — WET PREP, GENITAL
Clue Cells Wet Prep HPF POC: NONE SEEN
Sperm: NONE SEEN
Trich, Wet Prep: NONE SEEN
Yeast Wet Prep HPF POC: NONE SEEN

## 2018-07-11 LAB — POCT PREGNANCY, URINE: Preg Test, Ur: POSITIVE — AB

## 2018-07-11 MED ORDER — METRONIDAZOLE 500 MG PO TABS: 500.0000 mg | ORAL_TABLET | Freq: Two times a day (BID) | ORAL | 0 refills | Status: DC

## 2018-07-11 MED ORDER — PRENATAL MULTIVITAMIN CH: 1.0000 | ORAL_TABLET | Freq: Every day | ORAL | Status: DC

## 2018-07-11 NOTE — Discharge Instructions (Signed)

## 2018-07-11 NOTE — MAU Provider Note (Signed)
Chief Complaint: Back Pain; Abdominal Pain; Vaginal Discharge; and Vaginal Itching   First Provider Initiated Contact with Patient 07/11/18 1134     SUBJECTIVE HPI: Colleen Valentine is a 23 y.o. G3P0020 at [redacted]w[redacted]d who presents to Maternity Admissions reporting: Low abdominal pain and malodorous vaginal discharge x1 week.  States she went to her OB/GYN a week ago and was tested for STDs and everything was negative.  Thinks she has BV today.   Vaginal Bleeding: Denies Passage of tissue or clots: Denies Dizziness: Denies  O POS  Pain Location: Low abdomen/pelvis Quality: Cramping Severity: Moderate/10 on pain scale Duration: 1 week Course: Unchanged Context: Early pregnancy, IUP not yet verified Timing: Intermittent Modifying factors: Nothing.  Has not tried anything for the pain. Associated signs and symptoms: Positive for low back pain, increased, malodorous vaginal discharge.  Negative for urinary complaints, GI complaints, vaginal bleeding, fever, chills.  Past Medical History:  Diagnosis Date  . Anxiety   . Complication of anesthesia   . Depression   . GERD (gastroesophageal reflux disease)   . Insomnia   . Obesity   . PONV (postoperative nausea and vomiting)   . Seizures ([place])    once in 2016 due to alcohol poisioning  . Suicide ([place])    OB History  Gravida Para Term Preterm AB Living  3       2    SAB TAB Ectopic Multiple Live Births  2            # Outcome Date GA Lbr Len/2nd Weight Sex Delivery Anes PTL Lv  3 Current           2 SAB 2019          1 SAB 2015           Past Surgical History:  Procedure Laterality Date  . CHOLECYSTECTOMY  2015  . COSMETIC SURGERY  Age 76   dog bite to face   Social History   Socioeconomic History  . Marital status: Single    Spouse name: Not on file  . Number of children: Not on file  . Years of education: Not on file  . Highest education level: Not on file  Occupational History  . 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 Financial resource strain: Not on file  . Food insecurity:    Worry: Not on file    Inability: Not on file  . Transportation needs:    Medical: Not on file    Non-medical: Not on file  Tobacco Use  . Smoking status: Current Every Day Smoker    Packs/day: 0.50    Years: 5.00    Pack years: 2.50    Types: Cigarettes  . Smokeless tobacco: Never Used  . Tobacco comment: one pack cigarettes daily  Substance and Sexual Activity  . Alcohol use: Yes    Alcohol/week: 0.0 standard drinks    Comment: social  . Drug use: Yes    Types: Marijuana  . Sexual activity: Not Currently    Partners: Male    Birth control/protection: Other-see comments  Lifestyle  . Physical activity:    Days per week: Not on file    Minutes per session: Not on file  . Stress: Not on file  Relationships  . Social connections:    Talks on phone: Not on file    Gets together: Not on file    Attends religious service: Not on file  Active member of club or organization: Not on file    Attends meetings of clubs or organizations: Not on file    Relationship status: Not on file  . Intimate partner violence:    Fear of current or ex partner: Not on file    Emotionally abused: Not on file    Physically abused: Not on file    Forced sexual activity: Not on file  Other Topics Concern  . Not on file  Social History Narrative   ** Merged History Encounter **       No current facility-administered medications on file prior to encounter.    Current Outpatient Medications on File Prior to Encounter  Medication Sig Dispense Refill  . fluconazole (DIFLUCAN) 150 MG tablet 1 po q week x 4 weeks 4 tablet 0  . Lactobacillus (PROBIOTIC ACIDOPHILUS PO) Take 1 tablet by mouth daily.    . ondansetron (ZOFRAN) 4 MG tablet Take 1 tablet (4 mg total) by mouth every 8 (eight) hours as needed for nausea or vomiting. 20 tablet 0  . pantoprazole (PROTONIX) 40 MG tablet Take 1 tablet (40 mg  total) by mouth daily before breakfast. 30 tablet 5  . valACYclovir (VALTREX) 500 MG tablet Take 1 tablet (500 mg total) by mouth daily. 90 tablet 2   No Known Allergies  I have reviewed the past Medical Hx, Surgical Hx, Social Hx, Allergies and Medications.   Review of Systems  Constitutional: Negative for chills and fever.  Gastrointestinal: Positive for abdominal pain. Negative for abdominal distention, constipation, diarrhea, nausea and vomiting.  Genitourinary: Positive for pelvic pain and vaginal discharge. Negative for difficulty urinating, dysuria, flank pain, frequency, hematuria and vaginal bleeding.  Musculoskeletal: Positive for back pain.    OBJECTIVE Patient Vitals for the past 24 hrs:  BP Temp Temp src Pulse Resp SpO2  07/11/18 1421 122/67 - - 66 - -  07/11/18 1042 113/70 (!) 97.4 F (36.3 C) Oral 67 17 99 %   Constitutional: Well-developed, well-nourished female in no acute distress.  Cardiovascular: normal rate Respiratory: normal rate and effort.  GI: Abd soft, non-tender. Pos BS x 4 MS: Extremities nontender, no edema, normal ROM Neurologic: Alert and oriented x 4.  GU: Neg CVAT.  PELVIC EXAM: NEFG, moderate amount of malodorous discharge, no blood noted, cervix closed.  uterus top Normal size, no adnexal tenderness or masses. No CMT.  LAB RESULTS Results for orders placed or performed during the hospital encounter of 07/11/18 (from the past 24 hour(s))  Urinalysis, Routine w reflex microscopic     Status: Abnormal   Collection Time: 07/11/18 10:55 AM  Result Value Ref Range   Color, Urine YELLOW YELLOW   APPearance CLEAR CLEAR   Specific Gravity, Urine 1.015 1.005 - 1.030   pH 7.0 5.0 - 8.0   Glucose, UA NEGATIVE NEGATIVE mg/dL   Hgb urine dipstick NEGATIVE NEGATIVE   Bilirubin Urine NEGATIVE NEGATIVE   Ketones, ur NEGATIVE NEGATIVE mg/dL   Protein, ur NEGATIVE NEGATIVE mg/dL   Nitrite NEGATIVE NEGATIVE   Leukocytes,Ua TRACE (A) NEGATIVE  Urinalysis,  Microscopic (reflex)     Status: Abnormal   Collection Time: 07/11/18 10:55 AM  Result Value Ref Range   RBC / HPF 0-5 0 - 5 RBC/hpf   WBC, UA 0-5 0 - 5 WBC/hpf   Bacteria, UA MANY (A) NONE SEEN   Squamous Epithelial / LPF 6-10 0 - 5  Pregnancy, urine POC     Status: Abnormal   Collection Time: 07/11/18  10:57 AM  Result Value Ref Range   Preg Test, Ur POSITIVE (A) NEGATIVE  hCG, quantitative, pregnancy     Status: Abnormal   Collection Time: 07/11/18 12:00 PM  Result Value Ref Range   hCG, Beta Chain, Quant, S 24,850 (H) <5 mIU/mL  CBC     Status: None   Collection Time: 07/11/18 12:00 PM  Result Value Ref Range   WBC 6.7 4.0 - 10.5 K/uL   RBC 4.40 3.87 - 5.11 MIL/uL   Hemoglobin 14.1 12.0 - 15.0 g/dL   HCT 41.3 36.0 - 46.0 %   MCV 93.9 80.0 - 100.0 fL   MCH 32.0 26.0 - 34.0 pg   MCHC 34.1 30.0 - 36.0 g/dL   RDW 12.8 11.5 - 15.5 %   Platelets 195 150 - 400 K/uL   nRBC 0.0 0.0 - 0.2 %  Wet prep, genital     Status: Abnormal   Collection Time: 07/11/18 12:12 PM  Result Value Ref Range   Yeast Wet Prep HPF POC NONE SEEN NONE SEEN   Trich, Wet Prep NONE SEEN NONE SEEN   Clue Cells Wet Prep HPF POC NONE SEEN NONE SEEN   WBC, Wet Prep HPF POC FEW (A) NONE SEEN   Sperm NONE SEEN     IMAGING US Ob Transvaginal  Result Date: 07/11/2018 CLINICAL DATA:  Pregnant patient with abdominal pain. Quantitative beta HCG level 24,850. EXAM: OBSTETRIC <14 WK [place] AND TRANSVAGINAL OB US TECHNIQUE: Both transabdominal and transvaginal ultrasound examinations were performed for complete evaluation of the gestation as well as the maternal uterus, adnexal regions, and pelvic cul-de-sac. Transvaginal technique was performed to assess early pregnancy. COMPARISON:  None. FINDINGS: Intrauterine gestational sac: Single Yolk sac:  Visualized. Embryo:  Visualized. Cardiac Activity: Visualized. Heart Rate: 104 bpm CRL:  3.5 mm   5 w   6 d                  [place] EDC: 03/07/2019 Subchorionic hemorrhage:  Small  subchronic hemorrhage. Maternal uterus/adnexae: No uterine masses. Closed cervix. Normal ovaries and adnexa. No pelvic free fluid. IMPRESSION: 1. Single live intrauterine pregnancy with a measured gestational age of [redacted] weeks and 6 days. 2. Small subchronic hemorrhage. No other pregnancy complication. Normal ovaries and adnexa. Electronically Signed   By: Lajean Manes M.D.   On: 07/11/2018 13:50    MAU COURSE CBC, Quant, ABO/Rh, ultrasound, wet prep and GC/chlamydia culture, UA  MDM - Pain  in early pregnancy with normal intrauterine pregnancy and hemodynamically stable.  - Clinical Dx BV (and recent + test, untreated). Pt nervous about taking Flagyl because she states her Ob/Gyn told her not to take any pills in pregnancy, but pt is also nervous about not treating BV for concern that it will cause her to have a miscarriage. Reassured pt abotu extensive use of Flagyl in first trimester, but also supported her decision to wait until end of first trimester to Tx if she would be more comfortable.   ASSESSMENT 1. Normal IUP (intrauterine pregnancy) on prenatal ultrasound, first trimester   2. Abdominal pain during pregnancy in first trimester   3. BV (bacterial vaginosis)     PLAN Discharge home in stable condition. First trimester precautions Follow-up Information    Obstetrician of your choice Follow up.   Why:  Start prenatal care       [facility] Follow up.   Specialty:  Obstetrics and Gynecology Why:  As needed in pregnancy  emergencies Contact information: [address] [id number] [place] [phone]         Allergies as of 07/11/2018   No Known Allergies     Medication List    STOP taking these medications   fluconazole 150 MG tablet Commonly known as:  DIFLUCAN     TAKE these medications   metroNIDAZOLE 500 MG tablet Commonly known as:  FLAGYL Take 1 tablet (500 mg total) by mouth 2 (two) times daily.    ondansetron 4 MG tablet Commonly known as:  ZOFRAN Take 1 tablet (4 mg total) by mouth every 8 (eight) hours as needed for nausea or vomiting.   pantoprazole 40 MG tablet Commonly known as:  PROTONIX Take 1 tablet (40 mg total) by mouth daily before breakfast.   prenatal multivitamin Tabs tablet Take 1 tablet by mouth daily at 12 noon.   PROBIOTIC ACIDOPHILUS PO Take 1 tablet by mouth daily.   valACYclovir 500 MG tablet Commonly known as:  VALTREX Take 1 tablet (500 mg total) by mouth daily.        Tamala Julian, [place], [place] 07/11/2018  2:26 PM  4

## 2018-07-11 NOTE — MAU Note (Signed)
Pt states she started having some white discharge with some vaginal itching then she noticed an odor which started last week.  She then noticed some lower, mid and upper back pain as well as some lower abdominal cramping that comes and goes.

## 2018-07-28 DIAGNOSIS — N912 Amenorrhea, unspecified: Secondary | ICD-10-CM | POA: Diagnosis not present

## 2018-07-28 DIAGNOSIS — Z3A01 Less than 8 weeks gestation of pregnancy: Secondary | ICD-10-CM | POA: Diagnosis not present

## 2018-07-28 DIAGNOSIS — O26841 Uterine size-date discrepancy, first trimester: Secondary | ICD-10-CM | POA: Diagnosis not present

## 2018-07-28 DIAGNOSIS — O208 Other hemorrhage in early pregnancy: Secondary | ICD-10-CM | POA: Diagnosis not present

## 2018-07-28 DIAGNOSIS — Z0389 Encounter for observation for other suspected diseases and conditions ruled out: Secondary | ICD-10-CM | POA: Diagnosis not present

## 2018-07-28 DIAGNOSIS — Z3401 Encounter for supervision of normal first pregnancy, first trimester: Secondary | ICD-10-CM | POA: Diagnosis not present

## 2018-07-28 DIAGNOSIS — Z1331 Encounter for screening for depression: Secondary | ICD-10-CM | POA: Diagnosis not present

## 2018-07-28 DIAGNOSIS — Z3201 Encounter for pregnancy test, result positive: Secondary | ICD-10-CM | POA: Diagnosis not present

## 2018-07-31 ENCOUNTER — Ambulatory Visit: Payer: Medicaid Other | Admitting: Gastroenterology

## 2018-08-02 DIAGNOSIS — Z5329 Procedure and treatment not carried out because of patient's decision for other reasons: Secondary | ICD-10-CM | POA: Diagnosis not present

## 2018-08-02 DIAGNOSIS — R52 Pain, unspecified: Secondary | ICD-10-CM | POA: Diagnosis not present

## 2018-08-02 DIAGNOSIS — Z3A09 9 weeks gestation of pregnancy: Secondary | ICD-10-CM | POA: Diagnosis not present

## 2018-08-02 DIAGNOSIS — M545 Low back pain: Secondary | ICD-10-CM | POA: Diagnosis not present

## 2018-08-02 DIAGNOSIS — M5489 Other dorsalgia: Secondary | ICD-10-CM | POA: Diagnosis not present

## 2018-08-02 DIAGNOSIS — Z041 Encounter for examination and observation following transport accident: Secondary | ICD-10-CM | POA: Diagnosis not present

## 2018-08-19 DIAGNOSIS — Z3682 Encounter for antenatal screening for nuchal translucency: Secondary | ICD-10-CM | POA: Diagnosis not present

## 2018-08-19 DIAGNOSIS — Z3491 Encounter for supervision of normal pregnancy, unspecified, first trimester: Secondary | ICD-10-CM | POA: Diagnosis not present

## 2018-09-16 DIAGNOSIS — Z113 Encounter for screening for infections with a predominantly sexual mode of transmission: Secondary | ICD-10-CM | POA: Diagnosis not present

## 2018-09-16 DIAGNOSIS — Z3689 Encounter for other specified antenatal screening: Secondary | ICD-10-CM | POA: Diagnosis not present

## 2018-10-05 DIAGNOSIS — R51 Headache: Secondary | ICD-10-CM | POA: Diagnosis not present

## 2018-10-05 DIAGNOSIS — E876 Hypokalemia: Secondary | ICD-10-CM | POA: Diagnosis not present

## 2018-10-05 DIAGNOSIS — O99332 Smoking (tobacco) complicating pregnancy, second trimester: Secondary | ICD-10-CM | POA: Diagnosis not present

## 2018-10-05 DIAGNOSIS — R112 Nausea with vomiting, unspecified: Secondary | ICD-10-CM | POA: Diagnosis not present

## 2018-10-05 DIAGNOSIS — E860 Dehydration: Secondary | ICD-10-CM | POA: Diagnosis not present

## 2018-10-05 DIAGNOSIS — O26892 Other specified pregnancy related conditions, second trimester: Secondary | ICD-10-CM | POA: Diagnosis not present

## 2018-10-05 DIAGNOSIS — O219 Vomiting of pregnancy, unspecified: Secondary | ICD-10-CM | POA: Diagnosis not present

## 2018-10-05 DIAGNOSIS — Z3A18 18 weeks gestation of pregnancy: Secondary | ICD-10-CM | POA: Diagnosis not present

## 2018-10-14 DIAGNOSIS — Z3689 Encounter for other specified antenatal screening: Secondary | ICD-10-CM | POA: Diagnosis not present

## 2018-10-14 DIAGNOSIS — Z3A19 19 weeks gestation of pregnancy: Secondary | ICD-10-CM | POA: Diagnosis not present

## 2018-10-23 DIAGNOSIS — H5213 Myopia, bilateral: Secondary | ICD-10-CM | POA: Diagnosis not present

## 2018-10-23 DIAGNOSIS — H52223 Regular astigmatism, bilateral: Secondary | ICD-10-CM | POA: Diagnosis not present

## 2018-10-29 ENCOUNTER — Other Ambulatory Visit: Payer: Self-pay

## 2018-10-29 DIAGNOSIS — B9689 Other specified bacterial agents as the cause of diseases classified elsewhere: Secondary | ICD-10-CM

## 2018-10-29 DIAGNOSIS — O26891 Other specified pregnancy related conditions, first trimester: Secondary | ICD-10-CM

## 2018-10-29 DIAGNOSIS — R109 Unspecified abdominal pain: Secondary | ICD-10-CM

## 2018-10-29 DIAGNOSIS — Z3491 Encounter for supervision of normal pregnancy, unspecified, first trimester: Secondary | ICD-10-CM

## 2018-10-31 DIAGNOSIS — O26892 Other specified pregnancy related conditions, second trimester: Secondary | ICD-10-CM | POA: Diagnosis not present

## 2018-10-31 DIAGNOSIS — N898 Other specified noninflammatory disorders of vagina: Secondary | ICD-10-CM | POA: Diagnosis not present

## 2018-10-31 DIAGNOSIS — R109 Unspecified abdominal pain: Secondary | ICD-10-CM | POA: Diagnosis not present

## 2018-10-31 DIAGNOSIS — Z3A22 22 weeks gestation of pregnancy: Secondary | ICD-10-CM | POA: Diagnosis not present

## 2018-10-31 DIAGNOSIS — O368120 Decreased fetal movements, second trimester, not applicable or unspecified: Secondary | ICD-10-CM | POA: Diagnosis not present

## 2018-11-05 DIAGNOSIS — F603 Borderline personality disorder: Secondary | ICD-10-CM | POA: Diagnosis not present

## 2018-11-14 DIAGNOSIS — R6889 Other general symptoms and signs: Secondary | ICD-10-CM | POA: Diagnosis not present

## 2018-11-14 DIAGNOSIS — O26892 Other specified pregnancy related conditions, second trimester: Secondary | ICD-10-CM | POA: Diagnosis not present

## 2018-11-14 DIAGNOSIS — Z3A24 24 weeks gestation of pregnancy: Secondary | ICD-10-CM | POA: Diagnosis not present

## 2018-11-16 DIAGNOSIS — H5213 Myopia, bilateral: Secondary | ICD-10-CM | POA: Diagnosis not present

## 2018-11-17 DIAGNOSIS — F603 Borderline personality disorder: Secondary | ICD-10-CM | POA: Diagnosis not present

## 2018-12-15 DIAGNOSIS — Z362 Encounter for other antenatal screening follow-up: Secondary | ICD-10-CM | POA: Diagnosis not present

## 2018-12-15 DIAGNOSIS — Z23 Encounter for immunization: Secondary | ICD-10-CM | POA: Diagnosis not present

## 2018-12-15 DIAGNOSIS — Z3689 Encounter for other specified antenatal screening: Secondary | ICD-10-CM | POA: Diagnosis not present

## 2018-12-15 DIAGNOSIS — Z3403 Encounter for supervision of normal first pregnancy, third trimester: Secondary | ICD-10-CM | POA: Diagnosis not present

## 2018-12-15 DIAGNOSIS — Z202 Contact with and (suspected) exposure to infections with a predominantly sexual mode of transmission: Secondary | ICD-10-CM | POA: Diagnosis not present

## 2018-12-19 DIAGNOSIS — O26893 Other specified pregnancy related conditions, third trimester: Secondary | ICD-10-CM | POA: Diagnosis not present

## 2018-12-19 DIAGNOSIS — R0602 Shortness of breath: Secondary | ICD-10-CM | POA: Diagnosis not present

## 2018-12-19 DIAGNOSIS — Z20828 Contact with and (suspected) exposure to other viral communicable diseases: Secondary | ICD-10-CM | POA: Diagnosis not present

## 2018-12-19 DIAGNOSIS — R102 Pelvic and perineal pain: Secondary | ICD-10-CM | POA: Diagnosis not present

## 2018-12-19 DIAGNOSIS — R110 Nausea: Secondary | ICD-10-CM | POA: Diagnosis not present

## 2018-12-19 DIAGNOSIS — Z3A29 29 weeks gestation of pregnancy: Secondary | ICD-10-CM | POA: Diagnosis not present

## 2018-12-23 DIAGNOSIS — F603 Borderline personality disorder: Secondary | ICD-10-CM | POA: Diagnosis not present

## 2019-01-03 DIAGNOSIS — O26893 Other specified pregnancy related conditions, third trimester: Secondary | ICD-10-CM | POA: Diagnosis not present

## 2019-01-03 DIAGNOSIS — R6889 Other general symptoms and signs: Secondary | ICD-10-CM | POA: Diagnosis not present

## 2019-01-03 DIAGNOSIS — Z3A31 31 weeks gestation of pregnancy: Secondary | ICD-10-CM | POA: Diagnosis not present

## 2019-01-09 DIAGNOSIS — O219 Vomiting of pregnancy, unspecified: Secondary | ICD-10-CM | POA: Diagnosis not present

## 2019-01-09 DIAGNOSIS — O26893 Other specified pregnancy related conditions, third trimester: Secondary | ICD-10-CM | POA: Diagnosis not present

## 2019-01-09 DIAGNOSIS — R51 Headache: Secondary | ICD-10-CM | POA: Diagnosis not present

## 2019-01-09 DIAGNOSIS — R0982 Postnasal drip: Secondary | ICD-10-CM | POA: Diagnosis not present

## 2019-01-09 DIAGNOSIS — R509 Fever, unspecified: Secondary | ICD-10-CM | POA: Diagnosis not present

## 2019-01-09 DIAGNOSIS — R439 Unspecified disturbances of smell and taste: Secondary | ICD-10-CM | POA: Diagnosis not present

## 2019-01-09 DIAGNOSIS — R05 Cough: Secondary | ICD-10-CM | POA: Diagnosis not present

## 2019-01-09 DIAGNOSIS — O9989 Other specified diseases and conditions complicating pregnancy, childbirth and the puerperium: Secondary | ICD-10-CM | POA: Diagnosis not present

## 2019-01-09 DIAGNOSIS — J029 Acute pharyngitis, unspecified: Secondary | ICD-10-CM | POA: Diagnosis not present

## 2019-01-09 DIAGNOSIS — O99513 Diseases of the respiratory system complicating pregnancy, third trimester: Secondary | ICD-10-CM | POA: Diagnosis not present

## 2019-01-09 DIAGNOSIS — J3489 Other specified disorders of nose and nasal sinuses: Secondary | ICD-10-CM | POA: Diagnosis not present

## 2019-01-09 DIAGNOSIS — R0981 Nasal congestion: Secondary | ICD-10-CM | POA: Diagnosis not present

## 2019-01-14 ENCOUNTER — Ambulatory Visit
Admission: EM | Admit: 2019-01-14 | Discharge: 2019-01-14 | Disposition: A | Discharge status: Home / Self Care | Payer: Medicaid Other

## 2019-01-14 ENCOUNTER — Other Ambulatory Visit: Payer: Self-pay

## 2019-01-14 ENCOUNTER — Telehealth: Payer: Self-pay | Admitting: Family

## 2019-01-14 DIAGNOSIS — Z20828 Contact with and (suspected) exposure to other viral communicable diseases: Secondary | ICD-10-CM

## 2019-01-14 DIAGNOSIS — J22 Unspecified acute lower respiratory infection: Secondary | ICD-10-CM

## 2019-01-14 DIAGNOSIS — Z20822 Contact with and (suspected) exposure to covid-19: Secondary | ICD-10-CM

## 2019-01-14 MED ORDER — AZITHROMYCIN 200 MG/5ML PO SUSR: ORAL | 0 refills | Status: DC

## 2019-01-14 MED ORDER — AMOXICILLIN 400 MG/5ML PO SUSR: 500.0000 mg | Freq: Two times a day (BID) | ORAL | 0 refills | Status: AC

## 2019-01-14 NOTE — Telephone Encounter (Signed)
Patient aware she will need to either do a televisit with one of the providers here at the office or go to the urgent care in [place] to be evaluated.

## 2019-01-14 NOTE — ED Triage Notes (Signed)
Pt presents with complaints of nasal congestion, cough, loss of taste and loss of smell x 7 days. Patient had negative covid test that resulted today. Denies any relief with otc medication.

## 2019-01-14 NOTE — ED Provider Notes (Signed)
[place]   [id number] 01/14/19 Arrival Time: [id number]   CC: URI symptoms   SUBJECTIVE: History from: patient.  Colleen Valentine is a 23 y.o. female currently [redacted] weeks pregnant, who presents with congestion, rhinorrhea, productive cough with brown/ yellow sputum, fatigue, and SOB x 8 days.  Denies sick exposure to COVID, flu or strep.  Denies recent travel.  Had negative COVID results.  Has tried OTC meidcations without relief.  Denies aggravating factors.  Denies previous symptoms in the past.   Denies fever, chills, sinus pain, sore throat, wheezing, chest pain, chest pressure, nausea, vomiting, changes in bowel or bladder habits.    ROS: As per HPI.  All other pertinent ROS negative.     Past Medical History:  Diagnosis Date  . Anxiety   . Complication of anesthesia   . Depression   . GERD (gastroesophageal reflux disease)   . Insomnia   . Obesity   . PONV (postoperative nausea and vomiting)   . Seizures ([place])    once in 2016 due to alcohol poisioning  . Suicide [facility])    Past Surgical History:  Procedure Laterality Date  . CHOLECYSTECTOMY  2015  . COSMETIC SURGERY  Age 65   dog bite to face   No Known Allergies No current facility-administered medications on file prior to encounter.    Current Outpatient Medications on File Prior to Encounter  Medication Sig Dispense Refill  . Ascorbic Acid (VITAMIN C) 100 MG tablet Take 100 mg by mouth daily.    Marland Kitchen docusate sodium (COLACE) 100 MG capsule Take 100 mg by mouth 2 (two) times daily.    Marland Kitchen omeprazole (PRILOSEC) 10 MG capsule Take 10 mg by mouth daily.    . ondansetron (ZOFRAN) 4 MG tablet Take 1 tablet (4 mg total) by mouth every 8 (eight) hours as needed for nausea or vomiting. 20 tablet 0  . [DISCONTINUED] pantoprazole (PROTONIX) 40 MG tablet Take 1 tablet (40 mg total) by mouth daily before breakfast. 30 tablet 5   Social History   Socioeconomic History  . Marital status: Single    Spouse name: Not on file  .  Number of children: Not on file  . Years of education: Not on file  . Highest education level: Not on file  Occupational History  . 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 Financial resource strain: Not on file  . Food insecurity    Worry: Not on file    Inability: Not on file  . Transportation needs    Medical: Not on file    Non-medical: Not on file  Tobacco Use  . Smoking status: Current Every Day Smoker    Packs/day: 0.50    Years: 5.00    Pack years: 2.50    Types: Cigarettes  . Smokeless tobacco: Never Used  . Tobacco comment: one pack cigarettes daily  Substance and Sexual Activity  . Alcohol use: Yes    Alcohol/week: 0.0 standard drinks    Comment: social  . Drug use: Yes    Types: Marijuana  . Sexual activity: Not Currently    Partners: Male    Birth control/protection: Other-see comments  Lifestyle  . Physical activity    Days per week: Not on file    Minutes per session: Not on file  . Stress: Not on file  Relationships  . Social Herbalist on phone: Not on file    Gets together:  Not on file    Attends religious service: Not on file    Active member of club or organization: Not on file    Attends meetings of clubs or organizations: Not on file    Relationship status: Not on file  . Intimate partner violence    Fear of current or ex partner: Not on file    Emotionally abused: Not on file    Physically abused: Not on file    Forced sexual activity: Not on file  Other Topics Concern  . Not on file  Social History Narrative   ** Merged History Encounter **       Family History  Problem Relation Age of Onset  . Alcohol abuse Mother   . Stroke Mother   . Bipolar disorder Mother   . Alcohol abuse Father   . Bipolar disorder Father   . Colon cancer Maternal Grandfather   . Crohn's disease Paternal Grandmother   . Crohn's disease Paternal Aunt   . Crohn's disease Paternal Aunt   . Crohn's disease  Paternal Uncle   . Colon cancer Paternal Uncle   . Cancer Maternal Aunt        "stomach cancer"  . Celiac disease Neg Hx     OBJECTIVE:  Vitals:   01/14/19 1721  BP: 130/82  Pulse: (!) 110  Resp: 20  Temp: 98 F (36.7 C)  SpO2: 97%     General appearance: alert; appears mildly fatigued, but nontoxic; speaking in full sentences and tolerating own secretions HEENT: NCAT; Ears: EACs clear, TMs pearly gray; Eyes: PERRL.  EOM grossly intact. Nose: nares patent without rhinorrhea, Throat: oropharynx clear, tonsils non erythematous or enlarged, uvula midline  Neck: supple without LAD Lungs: unlabored respirations, symmetrical air entry; cough: absent; no respiratory distress; CTAB Heart: regular rate and rhythm.   Abdomen: Pregnant Skin: warm and dry Psychological: alert and cooperative; normal mood and affect  ASSESSMENT & PLAN:  1. Lower respiratory tract infection     Meds ordered this encounter  Medications  . amoxicillin (AMOXIL) 400 MG/5ML suspension    Sig: Take 6.3 mLs (500 mg total) by mouth 2 (two) times daily for 10 days.    Dispense:  135 mL    Refill:  0    Order Specific Question:   Supervising Provider    Answer:   Raylene Everts [id number]  . azithromycin (ZITHROMAX) 200 MG/5ML suspension    Sig: Take 12.6 mls by mouth on day 1; then take 6.3 mls by mouth on days 2-5    Dispense:  40 mL    Refill:  0    Order Specific Question:   Supervising Provider    Answer:   Raylene Everts [id number]   We will hold off on chest x-ray today, patient is [redacted] weeks pregnant Based on history of symptoms we will go ahead and treat for possible lower respiratory tract infection.  However, we cannot rule out COVID based on negative COVID result at this point.   Amoxcillin and azithromycin prescribed.  Take as directed and to completion  In the meantime: You should remain isolated in your home for 10 days from symptom onset AND greater than 72 hours after symptoms  resolution (absence of fever without the use of fever-reducing medication and improvement in respiratory symptoms), whichever is longer Get plenty of rest and push fluids Continue with OTC medications as needed for symptomatic relief Use OTC medications like tylenol as needed fever or pain Call or  go to the ED if you have any new or worsening symptoms such as fever, worsening cough, shortness of breath, chest tightness, chest pain, turning blue, changes in mental status, etc...   Reviewed expectations re: course of current medical issues. Questions answered. Outlined signs and symptoms indicating need for more acute intervention. Patient verbalized understanding. After Visit Summary given.         Lestine Box, PA-C 01/14/19 [id number]

## 2019-01-14 NOTE — Discharge Instructions (Signed)
We will hold off on chest x-ray today, patient is [redacted] weeks pregnant Based on history of symptoms we will go ahead and treat for possible lower respiratory tract infection.   Amoxcillin and azithromycin prescribed.  Take as directed and to completion  In the meantime: You should remain isolated in your home for 10 days from symptom onset AND greater than 72 hours after symptoms resolution (absence of fever without the use of fever-reducing medication and improvement in respiratory symptoms), whichever is longer Get plenty of rest and push fluids Continue with OTC medications as needed for symptomatic relief Use OTC medications like tylenol as needed fever or pain Call or go to the ED if you have any new or worsening symptoms such as fever, worsening cough, shortness of breath, chest tightness, chest pain, turning blue, changes in mental status, etc..Marland Kitchen

## 2019-01-15 DIAGNOSIS — Z79899 Other long term (current) drug therapy: Secondary | ICD-10-CM | POA: Diagnosis not present

## 2019-01-15 DIAGNOSIS — Z3A33 33 weeks gestation of pregnancy: Secondary | ICD-10-CM | POA: Diagnosis not present

## 2019-01-15 DIAGNOSIS — O26893 Other specified pregnancy related conditions, third trimester: Secondary | ICD-10-CM | POA: Diagnosis not present

## 2019-01-15 DIAGNOSIS — K219 Gastro-esophageal reflux disease without esophagitis: Secondary | ICD-10-CM | POA: Diagnosis not present

## 2019-01-15 DIAGNOSIS — O99333 Smoking (tobacco) complicating pregnancy, third trimester: Secondary | ICD-10-CM | POA: Diagnosis not present

## 2019-01-15 DIAGNOSIS — J209 Acute bronchitis, unspecified: Secondary | ICD-10-CM | POA: Diagnosis not present

## 2019-01-15 DIAGNOSIS — R05 Cough: Secondary | ICD-10-CM | POA: Diagnosis not present

## 2019-01-20 DIAGNOSIS — R300 Dysuria: Secondary | ICD-10-CM | POA: Diagnosis not present

## 2019-01-22 ENCOUNTER — Emergency Department (HOSPITAL_COMMUNITY)
Admission: EM | Admit: 2019-01-22 | Discharge: 2019-01-22 | Disposition: A | Discharge status: Home / Self Care | Payer: Medicaid Other | Attending: Emergency Medicine | Admitting: Emergency Medicine

## 2019-01-22 ENCOUNTER — Emergency Department (HOSPITAL_COMMUNITY): Payer: Medicaid Other

## 2019-01-22 ENCOUNTER — Emergency Department (HOSPITAL_COMMUNITY)
Admission: EM | Admit: 2019-01-22 | Discharge: 2019-01-22 | Disposition: A | Discharge status: Home / Self Care | Payer: Medicaid Other | Source: Home / Self Care | Attending: Emergency Medicine | Admitting: Emergency Medicine

## 2019-01-22 ENCOUNTER — Other Ambulatory Visit: Payer: Self-pay

## 2019-01-22 ENCOUNTER — Encounter (HOSPITAL_COMMUNITY): Payer: Self-pay

## 2019-01-22 ENCOUNTER — Encounter (HOSPITAL_COMMUNITY): Payer: Self-pay | Admitting: *Deleted

## 2019-01-22 DIAGNOSIS — R0789 Other chest pain: Secondary | ICD-10-CM | POA: Insufficient documentation

## 2019-01-22 DIAGNOSIS — T148XXA Other injury of unspecified body region, initial encounter: Secondary | ICD-10-CM

## 2019-01-22 DIAGNOSIS — Y929 Unspecified place or not applicable: Secondary | ICD-10-CM | POA: Insufficient documentation

## 2019-01-22 DIAGNOSIS — F17210 Nicotine dependence, cigarettes, uncomplicated: Secondary | ICD-10-CM | POA: Insufficient documentation

## 2019-01-22 DIAGNOSIS — O9A213 Injury, poisoning and certain other consequences of external causes complicating pregnancy, third trimester: Secondary | ICD-10-CM | POA: Diagnosis not present

## 2019-01-22 DIAGNOSIS — Z20828 Contact with and (suspected) exposure to other viral communicable diseases: Secondary | ICD-10-CM | POA: Diagnosis not present

## 2019-01-22 DIAGNOSIS — O99333 Smoking (tobacco) complicating pregnancy, third trimester: Secondary | ICD-10-CM | POA: Insufficient documentation

## 2019-01-22 DIAGNOSIS — R0602 Shortness of breath: Secondary | ICD-10-CM | POA: Diagnosis not present

## 2019-01-22 DIAGNOSIS — Z79899 Other long term (current) drug therapy: Secondary | ICD-10-CM | POA: Insufficient documentation

## 2019-01-22 DIAGNOSIS — R202 Paresthesia of skin: Secondary | ICD-10-CM | POA: Insufficient documentation

## 2019-01-22 DIAGNOSIS — Z3A34 34 weeks gestation of pregnancy: Secondary | ICD-10-CM | POA: Insufficient documentation

## 2019-01-22 DIAGNOSIS — O99513 Diseases of the respiratory system complicating pregnancy, third trimester: Secondary | ICD-10-CM | POA: Diagnosis not present

## 2019-01-22 DIAGNOSIS — R0781 Pleurodynia: Secondary | ICD-10-CM | POA: Diagnosis not present

## 2019-01-22 DIAGNOSIS — J209 Acute bronchitis, unspecified: Secondary | ICD-10-CM | POA: Insufficient documentation

## 2019-01-22 DIAGNOSIS — O9989 Other specified diseases and conditions complicating pregnancy, childbirth and the puerperium: Secondary | ICD-10-CM | POA: Diagnosis present

## 2019-01-22 DIAGNOSIS — M545 Low back pain, unspecified: Secondary | ICD-10-CM

## 2019-01-22 DIAGNOSIS — S29011A Strain of muscle and tendon of front wall of thorax, initial encounter: Secondary | ICD-10-CM | POA: Diagnosis not present

## 2019-01-22 DIAGNOSIS — X58XXXA Exposure to other specified factors, initial encounter: Secondary | ICD-10-CM | POA: Insufficient documentation

## 2019-01-22 DIAGNOSIS — Y999 Unspecified external cause status: Secondary | ICD-10-CM | POA: Insufficient documentation

## 2019-01-22 DIAGNOSIS — Y939 Activity, unspecified: Secondary | ICD-10-CM | POA: Insufficient documentation

## 2019-01-22 DIAGNOSIS — R200 Anesthesia of skin: Secondary | ICD-10-CM | POA: Diagnosis not present

## 2019-01-22 LAB — COMPREHENSIVE METABOLIC PANEL
ALT: 12 U/L (ref 0–44)
AST: 15 U/L (ref 15–41)
Albumin: 2.9 g/dL — ABNORMAL LOW (ref 3.5–5.0)
Alkaline Phosphatase: 108 U/L (ref 38–126)
Anion gap: 9 (ref 5–15)
BUN: 5 mg/dL — ABNORMAL LOW (ref 6–20)
CO2: 19 mmol/L — ABNORMAL LOW (ref 22–32)
Calcium: 8.5 mg/dL — ABNORMAL LOW (ref 8.9–10.3)
Chloride: 109 mmol/L (ref 98–111)
Creatinine, Ser: 0.58 mg/dL (ref 0.44–1.00)
GFR calc Af Amer: >60 mL/min (ref >60)
GFR calc non Af Amer: >60 mL/min (ref >60)
Glucose, Bld: 115 mg/dL — ABNORMAL HIGH (ref 70–99)
Potassium: 3.3 mmol/L — ABNORMAL LOW (ref 3.5–5.1)
Sodium: 137 mmol/L (ref 135–145)
Total Bilirubin: 0.1 mg/dL — ABNORMAL LOW (ref 0.3–1.2)
Total Protein: 6.4 g/dL — ABNORMAL LOW (ref 6.5–8.1)

## 2019-01-22 LAB — CBC
HCT: 32.2 % — ABNORMAL LOW (ref 36.0–46.0)
Hemoglobin: 10.7 g/dL — ABNORMAL LOW (ref 12.0–15.0)
MCH: 31.2 pg (ref 26.0–34.0)
MCHC: 33.2 g/dL (ref 30.0–36.0)
MCV: 93.9 fL (ref 80.0–100.0)
Platelets: 216 10*3/uL (ref 150–400)
RBC: 3.43 MIL/uL — ABNORMAL LOW (ref 3.87–5.11)
RDW: 15.2 % (ref 11.5–15.5)
WBC: 9.7 10*3/uL (ref 4.0–10.5)
nRBC: 0 % (ref 0.0–0.2)

## 2019-01-22 LAB — RAPID URINE DRUG SCREEN, HOSP PERFORMED
Amphetamines: NOT DETECTED
Barbiturates: NOT DETECTED
Benzodiazepines: NOT DETECTED
Cocaine: NOT DETECTED
Opiates: NOT DETECTED
Tetrahydrocannabinol: POSITIVE — AB

## 2019-01-22 LAB — DIFFERENTIAL
Abs Immature Granulocytes: 0.17 10*3/uL — ABNORMAL HIGH (ref 0.00–0.07)
Basophils Absolute: 0.1 10*3/uL (ref 0.0–0.1)
Basophils Relative: 1 %
Eosinophils Absolute: 0.2 10*3/uL (ref 0.0–0.5)
Eosinophils Relative: 2 %
Immature Granulocytes: 2 %
Lymphocytes Relative: 25 %
Lymphs Abs: 2.4 10*3/uL (ref 0.7–4.0)
Monocytes Absolute: 0.5 10*3/uL (ref 0.1–1.0)
Monocytes Relative: 5 %
Neutro Abs: 6.3 10*3/uL (ref 1.7–7.7)
Neutrophils Relative %: 65 %

## 2019-01-22 LAB — URINALYSIS, ROUTINE W REFLEX MICROSCOPIC
Bilirubin Urine: NEGATIVE
Glucose, UA: NEGATIVE mg/dL
Hgb urine dipstick: NEGATIVE
Ketones, ur: NEGATIVE mg/dL
Nitrite: NEGATIVE
Protein, ur: 30 mg/dL — AB
Specific Gravity, Urine: 1.023 (ref 1.005–1.030)
pH: 6 (ref 5.0–8.0)

## 2019-01-22 LAB — ETHANOL: Alcohol, Ethyl (B): <10 mg/dL (ref <10)

## 2019-01-22 LAB — CBG MONITORING, ED: Glucose-Capillary: 109 mg/dL — ABNORMAL HIGH (ref 70–99)

## 2019-01-22 LAB — POC URINE PREG, ED: Preg Test, Ur: POSITIVE — AB

## 2019-01-22 LAB — APTT: aPTT: 27 seconds (ref 24–36)

## 2019-01-22 LAB — PROTIME-INR
INR: 1 (ref 0.8–1.2)
Prothrombin Time: 13.3 seconds (ref 11.4–15.2)

## 2019-01-22 MED ORDER — HYDROCODONE-ACETAMINOPHEN 5-325 MG PO TABS
1.0000 | ORAL_TABLET | Freq: Once | ORAL | Status: AC
  Administered 2019-01-22: 08:00:00 1 via ORAL
  Filled 2019-01-22: qty 1

## 2019-01-22 MED ORDER — HYDROCODONE-ACETAMINOPHEN 5-325 MG PO TABS: 1.0000 | ORAL_TABLET | ORAL | 0 refills | Status: DC | PRN

## 2019-01-22 MED ORDER — SODIUM CHLORIDE 0.9 % IV BOLUS: 500.0000 mL | Freq: Once | INTRAVENOUS | Status: DC

## 2019-01-22 MED ORDER — ONDANSETRON 4 MG PO TBDP
4.0000 mg | ORAL_TABLET | Freq: Once | ORAL | Status: AC
  Administered 2019-01-22: 4 mg via ORAL
  Filled 2019-01-22: qty 1

## 2019-01-22 MED ORDER — METHYLPREDNISOLONE 4 MG PO TBPK: ORAL_TABLET | ORAL | 0 refills | Status: DC

## 2019-01-22 MED ORDER — HYDROCODONE-ACETAMINOPHEN 5-325 MG PO TABS
2.0000 | ORAL_TABLET | Freq: Once | ORAL | Status: AC
  Administered 2019-01-22: 2 via ORAL
  Filled 2019-01-22: qty 2

## 2019-01-22 MED ORDER — ONDANSETRON HCL 4 MG/2ML IJ SOLN
4.0000 mg | Freq: Once | INTRAMUSCULAR | Status: DC
  Filled 2019-01-22: qty 2

## 2019-01-22 NOTE — ED Notes (Signed)
Pt c/o vaginal pressure, reports that she started having discharge a few weeks ago and has changed from white to brown, pt also reports vaginal pressure for the past few weeks as well but this feels different,

## 2019-01-22 NOTE — ED Triage Notes (Signed)
Pt states that she coughed and heard a "pop" in right back area and then started having sob and pain with movement, diagnosed with bronchitis and sinuitis this am in er.

## 2019-01-22 NOTE — ED Notes (Signed)
CODE STROKE PAGED @ [phone]

## 2019-01-22 NOTE — ED Notes (Signed)
Patient transported to X-ray 

## 2019-01-22 NOTE — ED Notes (Signed)
[facility] stated that they were never called and alerted to have patient monitored. [facility] stated that they suggest that fetal heart tones be monitored before if patient is to be discharged. Then ok to discharged.

## 2019-01-22 NOTE — Discharge Instructions (Signed)
Your xrays are normal - no broken bones. Take the medicine as prescribed by the doctor earlier today Call your OBGYN for follow up in next week Tylenol is safe for pain - as long as you don't take it with the other pain medicine

## 2019-01-22 NOTE — ED Provider Notes (Signed)
[facility] EMERGENCY DEPARTMENT Provider Note   CSN: [id number] Arrival date & time: 01/22/19  1944     History   Chief Complaint Chief Complaint  Patient presents with  . Rib Injury  . Vaginal Pain    HPI Colleen Valentine is a 23 y.o. female.     HPI  This patient is a 23 year old female, she is at 34 weeks pregnancy with her third pregnancy.  The first 2 pregnancies were only carried to approximately 7 weeks.  She reports that this pregnancy has been complicated only by a abnormal glucose test however she has not followed up for the second part of that.  She was actually here this morning complaining of some right lower back pain which was thought to be muscular strain.  She was prescribed some medications including hydrocodone and prednisone according to the patient's report.  She had just picked them up when she was in the car and had a coughing fit when she felt a popping sensation in her right lower back in that same location.  Since that time she has had increased pain especially at that same site.  It is exactly the same location, she has tenderness over that area.  She denies any fevers, her coughing has been intermittent and that she has been told that she had bronchitis.  Review of the medical record from this morning shows that she was prescribed a Medrol Dosepak and a short course of Norco    Past Medical History:  Diagnosis Date  . Anxiety   . Complication of anesthesia   . Depression   . GERD (gastroesophageal reflux disease)   . Insomnia   . Obesity   . PONV (postoperative nausea and vomiting)   . Seizures ([place])    once in 2016 due to alcohol poisioning  . Suicide [facility])     Patient Active Problem List   Diagnosis Date Noted  . Acute pyelonephritis 05/17/2018  . Diarrhea 02/27/2017  . Rectal bleeding 02/27/2017  . Abdominal pain 02/27/2017  . Lower abdominal pain 02/27/2017  . Esophageal dysphagia 02/27/2017  . Nausea with vomiting 02/27/2017  . Current  smoker 08/12/2016  . Genital herpes 04/08/2016  . GERD (gastroesophageal reflux disease) 10/17/2015  . Morbid obesity with BMI of 40.0-44.9, adult ([place]) 10/17/2015  . ADHD (attention deficit hyperactivity disorder), combined type 07/21/2012  . MDD (major depressive disorder), recurrent episode, moderate ([place]) 04/01/2011  . Oppositional defiant disorder 04/01/2011  . Polysubstance abuse ([place]) 04/01/2011    Past Surgical History:  Procedure Laterality Date  . CHOLECYSTECTOMY  2015  . COSMETIC SURGERY  Age 32   dog bite to face     OB History    Gravida  3   Para      Term      Preterm      AB  2   Living        SAB  2   TAB      Ectopic      Multiple      Live Births               Home Medications    Prior to Admission medications   Medication Sig Start Date End Date Taking? Authorizing Provider  amoxicillin (AMOXIL) 400 MG/5ML suspension Take 6.3 mLs (500 mg total) by mouth 2 (two) times daily for 10 days. 01/14/19 01/24/19  Wurst, [place], PA-C  Ascorbic Acid (VITAMIN C) 100 MG tablet Take 100 mg by mouth daily.  [provider]  azithromycin (ZITHROMAX) 200 MG/5ML suspension Take 12.6 mls by mouth on day 1; then take 6.3 mls by mouth on days 2-5 01/14/19   Wurst, [place], PA-C  docusate sodium (COLACE) 100 MG capsule Take 100 mg by mouth 2 (two) times daily.    [provider]  HYDROcodone-acetaminophen (NORCO/VICODIN) 5-325 MG tablet Take 1 tablet by mouth every 4 (four) hours as needed for moderate pain. 01/22/19   Orpah Greek, MD  methylPREDNISolone (MEDROL DOSEPAK) 4 MG TBPK tablet As directed 01/22/19   Orpah Greek, MD  omeprazole (PRILOSEC) 10 MG capsule Take 10 mg by mouth daily.    [provider]  ondansetron (ZOFRAN) 4 MG tablet Take 1 tablet (4 mg total) by mouth every 8 (eight) hours as needed for nausea or vomiting. 05/25/18   Baruch Gouty, FNP  pantoprazole (PROTONIX) 40 MG tablet Take 1 tablet  (40 mg total) by mouth daily before breakfast. 06/25/18 01/14/19  Rakes, Connye Burkitt, FNP    Family History Family History  Problem Relation Age of Onset  . Alcohol abuse Mother   . Stroke Mother   . Bipolar disorder Mother   . Alcohol abuse Father   . Bipolar disorder Father   . Colon cancer Maternal Grandfather   . Crohn's disease Paternal Grandmother   . Crohn's disease Paternal Aunt   . Crohn's disease Paternal Aunt   . Crohn's disease Paternal Uncle   . Colon cancer Paternal Uncle   . Cancer Maternal Aunt        "stomach cancer"  . Celiac disease Neg Hx     Social History Social History   Tobacco Use  . Smoking status: Current Every Day Smoker    Packs/day: 0.50    Years: 5.00    Pack years: 2.50    Types: Cigarettes  . Smokeless tobacco: Never Used  . Tobacco comment: one pack cigarettes daily  Substance Use Topics  . Alcohol use: Yes    Alcohol/week: 0.0 standard drinks    Comment: social  . Drug use: Yes    Types: Marijuana     Allergies   Patient has no known allergies.   Review of Systems Review of Systems  All other systems reviewed and are negative.    Physical Exam Updated Vital Signs BP 114/75 (BP Location: Left Arm)   Pulse 73   Temp 98 F (36.7 C) (Oral)   Resp (!) 22   Ht 1.651 m (5\' 5" )   Wt 117 kg   LMP 05/28/2018 (Approximate)   SpO2 100%   BMI 42.93 kg/m   Physical Exam Vitals signs and nursing note reviewed.  Constitutional:      General: She is not in acute distress.    Appearance: She is well-developed.  HENT:     Head: Normocephalic and atraumatic.     Mouth/Throat:     Pharynx: No oropharyngeal exudate.  Eyes:     General: No scleral icterus.       Right eye: No discharge.        Left eye: No discharge.     Conjunctiva/sclera: Conjunctivae normal.     Pupils: Pupils are equal, round, and reactive to light.  Neck:     Musculoskeletal: Normal range of motion and neck supple.     Thyroid: No thyromegaly.      Vascular: No JVD.  Cardiovascular:     Rate and Rhythm: Normal rate and regular rhythm.     Heart  sounds: Normal heart sounds. No murmur. No friction rub. No gallop.   Pulmonary:     Effort: Pulmonary effort is normal. No respiratory distress.     Breath sounds: Normal breath sounds. No wheezing or rales.     Comments: Lungs are normal however the patient speaks in one-word sentences stating it hurts to take a deep breath Abdominal:     General: Bowel sounds are normal. There is no distension.     Palpations: Abdomen is soft. There is no mass.     Tenderness: There is no abdominal tenderness.     Comments: Gravid nontender abdomen  Musculoskeletal: Normal range of motion.        General: Tenderness present.     Comments: There is tenderness to palpation along the right lower posterior rib, no crepitance or subcutaneous emphysema, no signs of bruising contusions or ecchymosis  Lymphadenopathy:     Cervical: No cervical adenopathy.  Skin:    General: Skin is warm and dry.     Findings: No erythema or rash.  Neurological:     Mental Status: She is alert.     Coordination: Coordination normal.  Psychiatric:        Behavior: Behavior normal.      ED Treatments / Results  Labs (all labs ordered are listed, but only abnormal results are displayed) Labs Reviewed - No data to display  EKG None  Radiology Dg Ribs Unilateral W/chest Right  Result Date: 01/22/2019 CLINICAL DATA:  23 year old [redacted] week pregnant female with acute onset back and rib pain when coughing today. Subsequent shortness of breath and pain with movement. EXAM: RIGHT RIBS AND CHEST - 3+ VIEW COMPARISON:  Chest radiographs 01/15/2019 and earlier. FINDINGS: Mildly lower lung volumes. Mediastinal contours remain normal. Both lungs appear clear. No pneumothorax or pleural effusion. Visualized tracheal air column is within normal limits. Bone mineralization is within normal limits. No right rib fracture identified. A rib  marker is placed just below the 12th rib level. Right upper quadrant cholecystectomy clips noted. Negative visible bowel gas pattern. Other visible osseous structures appear intact. IMPRESSION: 1. No right rib fracture identified. 2. Mildly low lung volumes.  No cardiopulmonary abnormality. Electronically Signed   By: Genevie Ann M.D.   On: 01/22/2019 22:10   Ct Head Wo Contrast  Result Date: 01/22/2019 CLINICAL DATA:  Numbness in the left arm and neck. Third trimester pregnancy EXAM: CT HEAD WITHOUT CONTRAST TECHNIQUE: Contiguous axial images were obtained from the base of the skull through the vertex without intravenous contrast. COMPARISON:  07/22/2012 FINDINGS: Brain: No evidence of acute infarction, hemorrhage, hydrocephalus, extra-axial collection or mass lesion/mass effect. Vascular: No hyperdense vessel or unexpected calcification. Skull: Normal. Negative for fracture or focal lesion. Sinuses/Orbits: Generalized mucosal thickening in the covered paranasal sinuses. IMPRESSION: 1. Normal appearance of the brain. 2. Bilateral sinusitis. Electronically Signed   By: Monte Fantasia M.D.   On: 01/22/2019 07:25    Procedures Procedures (including critical care time)  Medications Ordered in ED Medications  HYDROcodone-acetaminophen (NORCO/VICODIN) 5-325 MG per tablet 2 tablet (2 tablets Oral Given 01/22/19 2257)     Initial Impression / Assessment and Plan / ED Course  I have reviewed the triage vital signs and the nursing notes.  Pertinent labs & imaging results that were available during my care of the patient were reviewed by me and considered in my medical decision making (see chart for details).        The patient's exam is consistent with  either rib injury or muscular strain or tear.  I have recommended an x-ray of that area to be sure since she is getting so far along and since she is so short of breath appearing.  She understands the risk of radiation to the fetus.  X-ray ordered   X-rays negative, patient improved with pain medicine, stable for discharge.  Final Clinical Impressions(s) / ED Diagnoses   Final diagnoses:  Muscle strain    ED Discharge Orders    None       Noemi Chapel, MD 01/22/19 2303

## 2019-01-22 NOTE — ED Triage Notes (Signed)
Pt reports she started feeling numbness in her left arm and neck.  Pt states she was on her way to the e.d. for some tightness and pain in her back.  Her numbness started while she was driving here.  Pt was found in her car parked in the [facility] parking lot.  Pt states she got confused and didn't know where to go and asked a nurse going into the building.

## 2019-01-22 NOTE — ED Notes (Signed)
CODE STROKE CANCELLED BY DR Betsey Holiday

## 2019-01-22 NOTE — ED Provider Notes (Signed)
[facility] EMERGENCY DEPARTMENT Provider Note   CSN: [id number] Arrival date & time: 01/22/19  0557     History   Chief Complaint Chief Complaint  Patient presents with   Numbness    34 wks preg    HPI Colleen Valentine is a 23 y.o. female.     Patient arrives in the emergency department with multiple complaints.  Patient reports that she is [redacted] weeks pregnant.  She has been sick for nearly 2 weeks.  She has been seen at urgent care and [facility] rocking him emergency department.  She started with cold symptoms including runny nose and congestion and then developed cough.  She has had a negative COVID test 10 days ago.  She has taken a course of amoxicillin and Zithromax as well as prednisone.  She reports that her symptoms are about the same.  She was coming to the ER tonight because she was experiencing pain in the right side of her back.  She continues to have cough and chest congestion.  While driving to the emergency department tonight she reports that she had an onset of a burning and tingling sensation of her neck that spread out to her lower face and her left arm.  This became a numbness and weakness of the arm.  She did not have any involvement of her left leg.  Upon arrival to the ER she reports that she is unable to move her left arm.     Past Medical History:  Diagnosis Date   Anxiety    Complication of anesthesia    Depression    GERD (gastroesophageal reflux disease)    Insomnia    Obesity    PONV (postoperative nausea and vomiting)    Seizures ([place])    once in 2016 due to alcohol poisioning   Suicide [facility])     Patient Active Problem List   Diagnosis Date Noted   Acute pyelonephritis 05/17/2018   Diarrhea 02/27/2017   Rectal bleeding 02/27/2017   Abdominal pain 02/27/2017   Lower abdominal pain 02/27/2017   Esophageal dysphagia 02/27/2017   Nausea with vomiting 02/27/2017   Current smoker 08/12/2016   Genital herpes 04/08/2016   GERD  (gastroesophageal reflux disease) 10/17/2015   Morbid obesity with BMI of 40.0-44.9, adult ([place]) 10/17/2015   ADHD (attention deficit hyperactivity disorder), combined type 07/21/2012   MDD (major depressive disorder), recurrent episode, moderate ([place]) 04/01/2011   Oppositional defiant disorder 04/01/2011   Polysubstance abuse ([place]) 04/01/2011    Past Surgical History:  Procedure Laterality Date   CHOLECYSTECTOMY  2015   COSMETIC SURGERY  Age 63   dog bite to face     OB History    Gravida  3   Para      Term      Preterm      AB  2   Living        SAB  2   TAB      Ectopic      Multiple      Live Births               Home Medications    Prior to Admission medications   Medication Sig Start Date End Date Taking? Authorizing Provider  amoxicillin (AMOXIL) 400 MG/5ML suspension Take 6.3 mLs (500 mg total) by mouth 2 (two) times daily for 10 days. 01/14/19 01/24/19  Wurst, [place], PA-C  Ascorbic Acid (VITAMIN C) 100 MG tablet Take 100 mg by mouth daily.  [provider]  azithromycin (ZITHROMAX) 200 MG/5ML suspension Take 12.6 mls by mouth on day 1; then take 6.3 mls by mouth on days 2-5 01/14/19   Wurst, [place], PA-C  docusate sodium (COLACE) 100 MG capsule Take 100 mg by mouth 2 (two) times daily.    [provider]  HYDROcodone-acetaminophen (NORCO/VICODIN) 5-325 MG tablet Take 1 tablet by mouth every 4 (four) hours as needed for moderate pain. 01/22/19   Orpah Greek, MD  methylPREDNISolone (MEDROL DOSEPAK) 4 MG TBPK tablet As directed 01/22/19   Orpah Greek, MD  omeprazole (PRILOSEC) 10 MG capsule Take 10 mg by mouth daily.    [provider]  ondansetron (ZOFRAN) 4 MG tablet Take 1 tablet (4 mg total) by mouth every 8 (eight) hours as needed for nausea or vomiting. 05/25/18   Baruch Gouty, FNP  pantoprazole (PROTONIX) 40 MG tablet Take 1 tablet (40 mg total) by mouth daily before breakfast. 06/25/18  01/14/19  Rakes, Connye Burkitt, FNP    Family History Family History  Problem Relation Age of Onset   Alcohol abuse Mother    Stroke Mother    Bipolar disorder Mother    Alcohol abuse Father    Bipolar disorder Father    Colon cancer Maternal Grandfather    Crohn's disease Paternal Grandmother    Crohn's disease Paternal Aunt    Crohn's disease Paternal Aunt    Crohn's disease Paternal Uncle    Colon cancer Paternal Uncle    Cancer Maternal Aunt        "stomach cancer"   Celiac disease Neg Hx     Social History Social History   Tobacco Use   Smoking status: Current Every Day Smoker    Packs/day: 0.50    Years: 5.00    Pack years: 2.50    Types: Cigarettes   Smokeless tobacco: Never Used   Tobacco comment: one pack cigarettes daily  Substance Use Topics   Alcohol use: Yes    Alcohol/week: 0.0 standard drinks    Comment: social   Drug use: Yes    Types: Marijuana     Allergies   Patient has no known allergies.   Review of Systems Review of Systems  HENT: Positive for congestion.   Respiratory: Positive for cough.   Musculoskeletal: Positive for back pain.  Neurological: Positive for weakness and numbness.  All other systems reviewed and are negative.    Physical Exam Updated Vital Signs BP 112/86    Pulse 76    Temp 97.8 F (36.6 C) (Oral)    Resp 14    Ht 5\' 5"  (1.651 m)    Wt 117 kg    LMP 05/28/2018 (Approximate)    SpO2 98%    BMI 42.93 kg/m   Physical Exam Vitals signs and nursing note reviewed.  Constitutional:      General: She is not in acute distress.    Appearance: Normal appearance. She is well-developed.  HENT:     Head: Normocephalic and atraumatic.     Right Ear: Hearing normal.     Left Ear: Hearing normal.     Nose: Nose normal.  Eyes:     Conjunctiva/sclera: Conjunctivae normal.     Pupils: Pupils are equal, round, and reactive to light.  Neck:     Musculoskeletal: Normal range of motion and neck supple.    Cardiovascular:     Rate and Rhythm: Regular rhythm.     Heart sounds: S1 normal and S2  normal. No murmur. No friction rub. No gallop.   Pulmonary:     Effort: Pulmonary effort is normal. No respiratory distress.     Breath sounds: Normal breath sounds.  Chest:     Chest wall: No tenderness.  Abdominal:     General: Bowel sounds are normal.     Palpations: Abdomen is soft.     Tenderness: There is no abdominal tenderness. There is no guarding or rebound. Negative signs include Murphy's sign and McBurney's sign.     Hernia: No hernia is present.  Musculoskeletal: Normal range of motion.     Thoracic back: She exhibits tenderness.       Back:  Skin:    General: Skin is warm and dry.     Findings: No rash.  Neurological:     Mental Status: She is alert and oriented to person, place, and time.     GCS: GCS eye subscore is 4. GCS verbal subscore is 5. GCS motor subscore is 6.     Comments: Neurologic exam was difficult to obtain.  Her symptoms were rapidly changing during the exam and were inconsistent.  Initially she reported complete inability to feel anything from the wrist down but could feel her forearm.  She reported that she was weak, however and had extremely poor effort to try and raise her left arm off the bed.  There is no effort to raise the arm when asked but she was able to hold it up in the air if passively put into a position.  Psychiatric:        Speech: Speech normal.        Behavior: Behavior normal.        Thought Content: Thought content normal.      ED Treatments / Results  Labs (all labs ordered are listed, but only abnormal results are displayed) Labs Reviewed  CBC - Abnormal; Notable for the following components:      Result Value   RBC 3.43 (*)    Hemoglobin 10.7 (*)    HCT 32.2 (*)    All other components within normal limits  DIFFERENTIAL - Abnormal; Notable for the following components:   Abs Immature Granulocytes 0.17 (*)    All other  components within normal limits  COMPREHENSIVE METABOLIC PANEL - Abnormal; Notable for the following components:   Potassium 3.3 (*)    CO2 19 (*)    Glucose, Bld 115 (*)    BUN 5 (*)    Calcium 8.5 (*)    Total Protein 6.4 (*)    Albumin 2.9 (*)    Total Bilirubin 0.1 (*)    All other components within normal limits  RAPID URINE DRUG SCREEN, HOSP PERFORMED - Abnormal; Notable for the following components:   Tetrahydrocannabinol POSITIVE (*)    All other components within normal limits  URINALYSIS, ROUTINE W REFLEX MICROSCOPIC - Abnormal; Notable for the following components:   APPearance HAZY (*)    Protein, ur 30 (*)    Leukocytes,Ua TRACE (*)    Bacteria, UA RARE (*)    All other components within normal limits  CBG MONITORING, ED - Abnormal; Notable for the following components:   Glucose-Capillary 109 (*)    All other components within normal limits  POC URINE PREG, ED - Abnormal; Notable for the following components:   Preg Test, Ur POSITIVE (*)    All other components within normal limits  ETHANOL  PROTIME-INR  APTT    EKG EKG Interpretation  Date/Time:  Friday January 22 2019 06:00:43 EDT Ventricular Rate:  79 PR Interval:    QRS Duration: 87 QT Interval:  372 QTC Calculation: 427 R Axis:   48 Text Interpretation:  Ectopic atrial rhythm Baseline wander in lead(s) II III aVL aVF V3 V5 Otherwise within normal limits Confirmed by Orpah Greek [phone]) on 01/22/2019 6:21:19 AM   Radiology Ct Head Wo Contrast  Result Date: 01/22/2019 CLINICAL DATA:  Numbness in the left arm and neck. Third trimester pregnancy EXAM: CT HEAD WITHOUT CONTRAST TECHNIQUE: Contiguous axial images were obtained from the base of the skull through the vertex without intravenous contrast. COMPARISON:  07/22/2012 FINDINGS: Brain: No evidence of acute infarction, hemorrhage, hydrocephalus, extra-axial collection or mass lesion/mass effect. Vascular: No hyperdense vessel or unexpected  calcification. Skull: Normal. Negative for fracture or focal lesion. Sinuses/Orbits: Generalized mucosal thickening in the covered paranasal sinuses. IMPRESSION: 1. Normal appearance of the brain. 2. Bilateral sinusitis. Electronically Signed   By: Monte Fantasia M.D.   On: 01/22/2019 07:25    Procedures Procedures (including critical care time)  Medications Ordered in ED Medications  HYDROcodone-acetaminophen (NORCO/VICODIN) 5-325 MG per tablet 1 tablet (has no administration in time range)  ondansetron (ZOFRAN) injection 4 mg (has no administration in time range)  sodium chloride 0.9 % bolus 500 mL (has no administration in time range)     Initial Impression / Assessment and Plan / ED Course  I have reviewed the triage vital signs and the nursing notes.  Pertinent labs & imaging results that were available during my care of the patient were reviewed by me and considered in my medical decision making (see chart for details).        Patient presents to the emergency department with multiple complaints.  She was coming to the ER to be evaluated for right sided back discomfort in the setting of cough and chest congestion has been ongoing for 2 weeks.  Patient was treated for possible pneumonia with amoxicillin and Zithromax.  She has persistent symptoms.  Patient reports that she has had lost of taste and smell was concerned that she had COVID-19.  She had a negative test 10 days ago.  While coming to the ER she had onset of bizarre neurologic symptoms that quite simply do not add up.  She reported complete loss of sensation of the hand but had preserved sensation of the forearm.  With this strange distribution of sensory deficit, she reported complete loss of strength of the entire left arm.  When asked to raise her arm off the bed she made no effort to move it at all, stating that she could not.  The first time her arm was lifted for her and fell back to the bed.  Interestingly, however,  when her arm was held over her face and dropped, the arm went vertically to avoid hitting her face on the way down.  This was repeated multiple times with similar results.  She then suddenly regained ability to hold her arm straight out against gravity after it was passively placed there for her.  Then within approximately 5 minutes of arrival she suddenly had complete resolution of all neurologic symptoms.  This occurred as we were radiating a tele-neurology evaluation.  Because her symptoms resolved, tele-neurology consultation was canceled.  Serial examinations after her resolution revealed that she has no neurologic symptoms at all.  This presentation and her examination are not consistent with an acute CNS process such as stroke.  Patient complaining  of 7 out of 10 right-sided back pain.  This is in the CVA/flank area.  She does report that the pain significantly worsens with bending, twisting and it is reproducible.  She is not experiencing any shortness of breath.  Pain does not appear to be present above the diaphragm, doubt PE.  She has not noticed urinary symptoms.  She had does have a history of pyelonephritis, however.  Urinalysis does not suggest infection today.  Patient still complaining of the cough and chest congestion.  Will dose with steroids.  Patient in a great deal of pain currently, will give 1 day worth of pain medication.  Continue albuterol inhaler that she was given at previous visit.  Will retest for COVID.  Final Clinical Impressions(s) / ED Diagnoses   Final diagnoses:  Acute right-sided low back pain without sciatica  Acute bronchitis, unspecified organism    ED Discharge Orders         Ordered    HYDROcodone-acetaminophen (NORCO/VICODIN) 5-325 MG tablet  Every 4 hours PRN     01/22/19 0741    methylPREDNISolone (MEDROL DOSEPAK) 4 MG TBPK tablet     01/22/19 0741           Orpah Greek, MD 01/22/19 [phone]

## 2019-01-27 DIAGNOSIS — O26893 Other specified pregnancy related conditions, third trimester: Secondary | ICD-10-CM | POA: Diagnosis not present

## 2019-01-27 DIAGNOSIS — N898 Other specified noninflammatory disorders of vagina: Secondary | ICD-10-CM | POA: Diagnosis not present

## 2019-02-02 DIAGNOSIS — Z3689 Encounter for other specified antenatal screening: Secondary | ICD-10-CM | POA: Diagnosis not present

## 2019-02-08 ENCOUNTER — Other Ambulatory Visit: Payer: Self-pay | Admitting: Family

## 2019-02-16 DIAGNOSIS — M549 Dorsalgia, unspecified: Secondary | ICD-10-CM | POA: Diagnosis not present

## 2019-02-16 DIAGNOSIS — Z3A37 37 weeks gestation of pregnancy: Secondary | ICD-10-CM | POA: Diagnosis not present

## 2019-02-16 DIAGNOSIS — O26893 Other specified pregnancy related conditions, third trimester: Secondary | ICD-10-CM | POA: Diagnosis not present

## 2019-02-16 DIAGNOSIS — R109 Unspecified abdominal pain: Secondary | ICD-10-CM | POA: Diagnosis not present

## 2019-02-17 DIAGNOSIS — R109 Unspecified abdominal pain: Secondary | ICD-10-CM | POA: Diagnosis not present

## 2019-02-17 DIAGNOSIS — M549 Dorsalgia, unspecified: Secondary | ICD-10-CM | POA: Diagnosis not present

## 2019-02-17 DIAGNOSIS — Z3A37 37 weeks gestation of pregnancy: Secondary | ICD-10-CM | POA: Diagnosis not present

## 2019-02-17 DIAGNOSIS — O26893 Other specified pregnancy related conditions, third trimester: Secondary | ICD-10-CM | POA: Diagnosis not present

## 2019-02-19 DIAGNOSIS — O368130 Decreased fetal movements, third trimester, not applicable or unspecified: Secondary | ICD-10-CM | POA: Diagnosis not present

## 2019-02-19 DIAGNOSIS — Z3A38 38 weeks gestation of pregnancy: Secondary | ICD-10-CM | POA: Diagnosis not present

## 2019-02-19 DIAGNOSIS — O26893 Other specified pregnancy related conditions, third trimester: Secondary | ICD-10-CM | POA: Diagnosis not present

## 2019-02-19 DIAGNOSIS — M549 Dorsalgia, unspecified: Secondary | ICD-10-CM | POA: Diagnosis not present

## 2019-02-20 ENCOUNTER — Inpatient Hospital Stay (HOSPITAL_COMMUNITY)
Admission: AD | Admit: 2019-02-20 | Discharge: 2019-02-20 | Disposition: A | Discharge status: Home / Self Care | Payer: Medicaid Other | Attending: Obstetrics & Gynecology | Admitting: Obstetrics & Gynecology

## 2019-02-20 ENCOUNTER — Other Ambulatory Visit: Payer: Self-pay

## 2019-02-20 ENCOUNTER — Encounter (HOSPITAL_COMMUNITY): Payer: Self-pay

## 2019-02-20 DIAGNOSIS — M549 Dorsalgia, unspecified: Secondary | ICD-10-CM | POA: Insufficient documentation

## 2019-02-20 DIAGNOSIS — O2393 Unspecified genitourinary tract infection in pregnancy, third trimester: Secondary | ICD-10-CM | POA: Diagnosis not present

## 2019-02-20 DIAGNOSIS — N898 Other specified noninflammatory disorders of vagina: Secondary | ICD-10-CM | POA: Insufficient documentation

## 2019-02-20 DIAGNOSIS — O26893 Other specified pregnancy related conditions, third trimester: Secondary | ICD-10-CM | POA: Diagnosis not present

## 2019-02-20 DIAGNOSIS — O98813 Other maternal infectious and parasitic diseases complicating pregnancy, third trimester: Secondary | ICD-10-CM | POA: Diagnosis not present

## 2019-02-20 DIAGNOSIS — Z3A37 37 weeks gestation of pregnancy: Secondary | ICD-10-CM | POA: Insufficient documentation

## 2019-02-20 DIAGNOSIS — B9689 Other specified bacterial agents as the cause of diseases classified elsewhere: Secondary | ICD-10-CM | POA: Insufficient documentation

## 2019-02-20 DIAGNOSIS — O479 False labor, unspecified: Secondary | ICD-10-CM

## 2019-02-20 DIAGNOSIS — N760 Acute vaginitis: Secondary | ICD-10-CM

## 2019-02-20 DIAGNOSIS — Z3689 Encounter for other specified antenatal screening: Secondary | ICD-10-CM

## 2019-02-20 LAB — WET PREP, GENITAL
Sperm: NONE SEEN
Trich, Wet Prep: NONE SEEN
Yeast Wet Prep HPF POC: NONE SEEN

## 2019-02-20 LAB — POCT FERN TEST: POCT Fern Test: NEGATIVE

## 2019-02-20 MED ORDER — METRONIDAZOLE 0.75 % VA GEL: 1.0000 | Freq: Every day | VAGINAL | 0 refills | Status: DC

## 2019-02-20 MED ORDER — ACETAMINOPHEN 500 MG PO TABS
1000.0000 mg | ORAL_TABLET | Freq: Once | ORAL | Status: AC
  Administered 2019-02-20: 1000 mg via ORAL
  Filled 2019-02-20: qty 2

## 2019-02-20 NOTE — MAU Note (Signed)
Presents with regular, painful cxts and poss ROM approx 2 hrs ago.Pain 9/10. Pt unable to time cxts. Noticed some bldg yesterday after mucous plug expelled; however resolved. Pos FM  Gilmer Mor RN

## 2019-02-20 NOTE — MAU Provider Note (Signed)
None      S: Colleen Valentine is a 23 y.o. G3P0020 at [redacted]w[redacted]d  who presents to MAU today complaining of leaking of fluid for about 2 hours. She denies vaginal bleeding. She endorses contractions. She reports normal fetal movement.    O: BP 130/76 (BP Location: Left Arm)   Pulse 100   Temp 97.7 F (36.5 C) (Oral)   Resp 16   Ht 5\' 5"  (1.651 m)   Wt 118.8 kg   LMP 05/28/2018 (Approximate)   SpO2 100% Comment: room air   BMI 43.60 kg/m  GENERAL: Well-developed, well-nourished female in no acute distress.  HEAD: Normocephalic, atraumatic.  CHEST: Normal effort of breathing, regular heart rate ABDOMEN: Soft, nontender, gravid PELVIC: Normal external female genitalia. Vagina is pink and rugated.Moderate amt thin white milky discharge. Cervix with normal contour, no lesions. Moderate amt yellow mucoid discharge from os.  Negative pooling. Fern, Wet Prep, and GC/CT Collected Rectum/Perineum: Multiple Skin tags. Large (2cm) skin tag vs hemorrhoid noted.   Cervical exam:  Dilation: 2 Effacement (%): Thick Cervical Position: Posterior Presentation: Vertex Exam by:: [name] RN   Fetal Monitoring: FHT: 130 bpm, Mod Var, -Decels, +Accels Toco: Irritability  Results for orders placed or performed during the hospital encounter of 02/20/19 (from the past 24 hour(s))  Wet prep, genital     Status: Abnormal   Collection Time: 02/20/19 10:50 PM   Specimen: Cervical/Vaginal swab  Result Value Ref Range   Yeast Wet Prep HPF POC NONE SEEN NONE SEEN   Trich, Wet Prep NONE SEEN NONE SEEN   Clue Cells Wet Prep HPF POC PRESENT (A) NONE SEEN   WBC, Wet Prep HPF POC MANY (A) NONE SEEN   Sperm NONE SEEN   Fern Test     Status: Normal   Collection Time: 02/20/19 11:01 PM  Result Value Ref Range   POCT Fern Test Negative = intact amniotic membranes      A: SIUP at [redacted]w[redacted]d  Membranes intact  Cat I FT Vaginal Discharge Back Pain  P: -Exam findings discussed. -Informed that exam not  suspicious for ROM; Fern Negative. -Cultures collected and pending.  -Patient c/o back pain and requests pain medication.  Reports that she drove herself and FOB unable to pick her up. -Patient expresses desire to transfer to [facility] for delivery giving several complaints re: [facility] staff. -Patient redirected and encouraged to discuss her complaints with her primary ob provider for resolution. -Patient without further questions or concerns. -Will give tylenol and reassess.  [name], [place] 02/20/2019 10:58 PM   Reassessment (11:31 PM) Bacterial Vaginosis   -NST Reactive -Wet prep returns significant for clue cells -Results discussed with patient. -Rx for Metrogel 0.75% PV QHS x 5days sent to pharmacy on file. -Encouraged to call or return to MAU if symptoms worsen or with the onset of new symptoms. -Discharged to home in stable condition.   [name] MSN, CNM Advanced Practice Provider, [facility] for [facility]

## 2019-02-20 NOTE — Discharge Instructions (Signed)
Back Pain in Pregnancy Back pain during pregnancy is common. Back pain may be caused by several factors that are related to changes during your pregnancy. Follow these instructions at home: Managing pain, stiffness, and swelling      If directed, for sudden (acute) back pain, put ice on the painful area. ? Put ice in a plastic bag. ? Place a towel between your skin and the bag. ? Leave the ice on for 20 minutes, 2-3 times per day.  If directed, apply heat to the affected area before you exercise. Use the heat source that your health care provider recommends, such as a moist heat pack or a heating pad. ? Place a towel between your skin and the heat source. ? Leave the heat on for 20-30 minutes. ? Remove the heat if your skin turns bright red. This is especially important if you are unable to feel pain, heat, or cold. You may have a greater risk of getting burned.  If directed, massage the affected area. Activity  Exercise as told by your health care provider. Gentle exercise is the best way to prevent or manage back pain.  Listen to your body when lifting. If lifting hurts, ask for help or bend your knees. This uses your leg muscles instead of your back muscles.  Squat down when picking up something from the floor. Do not bend over.  Only use bed rest for short periods as told by your health care provider. Bed rest should only be used for the most severe episodes of back pain. Standing, sitting, and lying down  Do not stand in one place for long periods of time.  Use good posture when sitting. Make sure your head rests over your shoulders and is not hanging forward. Use a pillow on your lower back if necessary.  Try sleeping on your side, preferably the left side, with a pregnancy support pillow or 1-2 regular pillows between your legs. ? If you have back pain after a night's rest, your bed may be too soft. ? A firm mattress may provide more support for your back during  pregnancy. General instructions  Do not wear high heels.  Eat a healthy diet. Try to gain weight within your health care provider's recommendations.  Use a maternity girdle, elastic sling, or back brace as told by your health care provider.  Take over-the-counter and prescription medicines only as told by your health care provider.  Work with a physical therapist or massage therapist to find ways to manage back pain. Acupuncture or massage therapy may be helpful.  Keep all follow-up visits as told by your health care provider. This is important. Contact a health care provider if:  Your back pain interferes with your daily activities.  You have increasing pain in other parts of your body. Get help right away if:  You develop numbness, tingling, weakness, or problems with the use of your arms or legs.  You develop severe back pain that is not controlled with medicine.  You have a change in bowel or bladder control.  You develop shortness of breath, dizziness, or you faint.  You develop nausea, vomiting, or sweating.  You have back pain that is a rhythmic, cramping pain similar to labor pains. Labor pain is usually 1-2 minutes apart, lasts for about 1 minute, and involves a bearing down feeling or pressure in your pelvis.  You have back pain and your water breaks or you have vaginal bleeding.  You have back pain or numbness  that travels down your leg. °· Your back pain developed after you fell. °· You develop pain on one side of your back. °· You see blood in your urine. °· You develop skin blisters in the area of your back pain. °Summary °· Back pain may be caused by several factors that are related to changes during your pregnancy. °· Follow instructions as told by your health care provider for managing pain, stiffness, and swelling. °· Exercise as told by your health care provider. Gentle exercise is the best way to prevent or manage back pain. °· Take over-the-counter and  prescription medicines only as told by your health care provider. °· Keep all follow-up visits as told by your health care provider. This is important. °This information is not intended to replace advice given to you by your health care provider. Make sure you discuss any questions you have with your health care provider. °Document Released: 08/21/2005 Document Revised: 09/01/2018 Document Reviewed: 10/29/2017 °Elsevier Patient Education © 2020 [employer] Inc[employer] ° °

## 2019-02-23 DIAGNOSIS — Z3689 Encounter for other specified antenatal screening: Secondary | ICD-10-CM | POA: Diagnosis not present

## 2019-02-23 LAB — CERVICOVAGINAL ANCILLARY ONLY
Chlamydia: NEGATIVE
Neisseria Gonorrhea: NEGATIVE

## 2019-02-26 DIAGNOSIS — O26893 Other specified pregnancy related conditions, third trimester: Secondary | ICD-10-CM | POA: Diagnosis not present

## 2019-02-26 DIAGNOSIS — R102 Pelvic and perineal pain: Secondary | ICD-10-CM | POA: Diagnosis not present

## 2019-02-26 DIAGNOSIS — Z3A39 39 weeks gestation of pregnancy: Secondary | ICD-10-CM | POA: Diagnosis not present

## 2019-04-19 DIAGNOSIS — L989 Disorder of the skin and subcutaneous tissue, unspecified: Secondary | ICD-10-CM | POA: Diagnosis not present

## 2019-05-06 ENCOUNTER — Other Ambulatory Visit: Payer: Self-pay

## 2019-05-07 ENCOUNTER — Encounter: Payer: Self-pay | Admitting: Family

## 2019-05-07 ENCOUNTER — Ambulatory Visit: Payer: Medicaid Other | Admitting: Family

## 2019-05-07 VITALS — BP 124/87 | Temp 97.5°F | Ht 65.0 in | Wt 237.0 lb

## 2019-05-07 DIAGNOSIS — K644 Residual hemorrhoidal skin tags: Secondary | ICD-10-CM

## 2019-05-07 NOTE — Patient Instructions (Signed)

## 2019-05-07 NOTE — Progress Notes (Signed)
   Subjective:    Patient ID: Colleen Valentine, female    DOB: [birth year], 23 y.o.   MRN: [id number]  Chief Complaint  Patient presents with  . hemorroid with swelling    HPI Pt presents to the office today with a hemorrhoid that she got when she was two months pregnant. Her son is now two month. She reports the area is swollen, bleeds at times. Denies any tenderness at this time. She has tried OTC creams and suppositories, stool softeners with no relief.    Review of Systems  All other systems reviewed and are negative.      Objective:   Physical Exam Vitals reviewed.  Constitutional:      General: She is not in acute distress.    Appearance: She is well-developed.  HENT:     Head: Normocephalic and atraumatic.  Eyes:     Pupils: Pupils are equal, round, and reactive to light.  Neck:     Thyroid: No thyromegaly.  Cardiovascular:     Rate and Rhythm: Normal rate and regular rhythm.     Heart sounds: Normal heart sounds. No murmur.  Pulmonary:     Effort: Pulmonary effort is normal. No respiratory distress.     Breath sounds: Normal breath sounds. No wheezing.  Abdominal:     General: Bowel sounds are normal. There is no distension.     Palpations: Abdomen is soft.     Tenderness: There is no abdominal tenderness.  Genitourinary:      Comments: Large hemorrhoid skin tag Musculoskeletal:        General: No tenderness. Normal range of motion.     Cervical back: Normal range of motion and neck supple.  Skin:    General: Skin is warm and dry.  Neurological:     Mental Status: She is alert and oriented to person, place, and time.     Cranial Nerves: No cranial nerve deficit.     Deep Tendon Reflexes: Reflexes are normal and symmetric.  Psychiatric:        Behavior: Behavior normal.        Thought Content: Thought content normal.        Judgment: Judgment normal.       BP 124/87   Temp (!) 97.5 F (36.4 C) (Temporal)   Ht 5\' 5"  (1.651 m)   Wt 237 lb (107.5 kg)    LMP 05/28/2018 (Approximate)   SpO2 100%   BMI 39.44 kg/m      Assessment & Plan:  Ingra Confer comes in today with chief complaint of hemorroid with swelling   Diagnosis and orders addressed:  1. Residual hemorrhoidal skin tags Continue stool softener High fiber diet Do not strain Follow up with surgery RTO if symptoms worsen or do not improve  - Ambulatory referral to [place], FNP

## 2019-06-02 DIAGNOSIS — N898 Other specified noninflammatory disorders of vagina: Secondary | ICD-10-CM | POA: Diagnosis not present

## 2019-06-02 DIAGNOSIS — L0291 Cutaneous abscess, unspecified: Secondary | ICD-10-CM | POA: Diagnosis not present

## 2019-06-14 ENCOUNTER — Other Ambulatory Visit: Payer: Self-pay

## 2019-06-14 ENCOUNTER — Ambulatory Visit
Admission: EM | Admit: 2019-06-14 | Discharge: 2019-06-14 | Disposition: A | Discharge status: Home / Self Care | Payer: Medicaid Other | Attending: Emergency Medicine | Admitting: Emergency Medicine

## 2019-06-14 DIAGNOSIS — J069 Acute upper respiratory infection, unspecified: Secondary | ICD-10-CM

## 2019-06-14 DIAGNOSIS — Z20822 Contact with and (suspected) exposure to covid-19: Secondary | ICD-10-CM | POA: Diagnosis not present

## 2019-06-14 MED ORDER — BENZONATATE 100 MG PO CAPS: 100.0000 mg | ORAL_CAPSULE | Freq: Three times a day (TID) | ORAL | 0 refills | Status: DC

## 2019-06-14 MED ORDER — CETIRIZINE HCL 10 MG PO TABS: 10.0000 mg | ORAL_TABLET | Freq: Every day | ORAL | 0 refills | Status: DC

## 2019-06-14 MED ORDER — ALBUTEROL SULFATE HFA 108 (90 BASE) MCG/ACT IN AERS: 1.0000 | INHALATION_SPRAY | Freq: Four times a day (QID) | RESPIRATORY_TRACT | 0 refills | Status: DC | PRN

## 2019-06-14 MED ORDER — FLUTICASONE PROPIONATE 50 MCG/ACT NA SUSP: 2.0000 | Freq: Every day | NASAL | 0 refills | Status: DC

## 2019-06-14 NOTE — ED Provider Notes (Signed)
[place]   [id number] 06/14/19 Arrival Time: 1419   CC: COVID symptoms  SUBJECTIVE: History from: patient.  Colleen Valentine is a 24 y.o. female who presents with productive cough with yellow sputum, runny nose, and fatigue x 1 week.  Reports positive COVID exposure last week.  Denies recent travel.  Denies aggravating or alleviating factors.  Denies previous COVID infection. Reports low grade temp of 99, and mild chest tightness with cough.   Denies chills, sore throat, SOB, wheezing, chest pain, nausea, vomiting, changes in bowel or bladder habits.     ROS: As per HPI.  All other pertinent ROS negative.     Past Medical History:  Diagnosis Date  . Anxiety   . Complication of anesthesia   . Depression   . GERD (gastroesophageal reflux disease)   . Insomnia   . Obesity   . PONV (postoperative nausea and vomiting)   . Seizures ([place])    once in 2016 due to alcohol poisioning  . Suicide [facility])    Past Surgical History:  Procedure Laterality Date  . CHOLECYSTECTOMY  2015  . COSMETIC SURGERY  Age 89   dog bite to face   No Known Allergies No current facility-administered medications on file prior to encounter.   Current Outpatient Medications on File Prior to Encounter  Medication Sig Dispense Refill  . valACYclovir HCl (VALTREX PO) Take by mouth.    . [DISCONTINUED] pantoprazole (PROTONIX) 40 MG tablet Take 1 tablet (40 mg total) by mouth daily before breakfast. 30 tablet 5   Social History   Socioeconomic History  . Marital status: Single    Spouse name: Not on file  . Number of children: Not on file  . Years of education: Not on file  . Highest education level: Not on file  Occupational History  . 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 Smoking status: Current Every Day Smoker    Packs/day: 0.50    Years: 5.00    Pack years: 2.50    Types: Cigarettes  . Smokeless tobacco: Never Used  . Tobacco comment: one  pack cigarettes daily  Substance and Sexual Activity  . Alcohol use: Yes    Alcohol/week: 0.0 standard drinks    Comment: social  . Drug use: Yes    Types: Marijuana  . Sexual activity: Not Currently    Partners: Male    Birth control/protection: Other-see comments  Other Topics Concern  . Not on file  Social History Narrative   ** Merged History Encounter **       Social Determinants of Health   Financial Resource Strain:   . Difficulty of Paying Living Expenses: Not on file  Food Insecurity:   . Worried About Charity fundraiser in the Last Year: Not on file  . Ran Out of Food in the Last Year: Not on file  Transportation Needs:   . Lack of Transportation (Medical): Not on file  . Lack of Transportation (Non-Medical): Not on file  Physical Activity:   . Days of Exercise per Week: Not on file  . Minutes of Exercise per Session: Not on file  Stress:   . Feeling of Stress : Not on file  Social Connections:   . Frequency of Communication with Friends and Family: Not on file  . Frequency of Social Gatherings with Friends and Family: Not on file  . Attends Religious Services: Not on file  . Active Member of  Clubs or Organizations: Not on file  . Attends Archivist Meetings: Not on file  . Marital Status: Not on file  Intimate Partner Violence:   . Fear of Current or Ex-Partner: Not on file  . Emotionally Abused: Not on file  . Physically Abused: Not on file  . Sexually Abused: Not on file   Family History  Problem Relation Age of Onset  . Alcohol abuse Mother   . Stroke Mother   . Bipolar disorder Mother   . Alcohol abuse Father   . Bipolar disorder Father   . Colon cancer Maternal Grandfather   . Crohn's disease Paternal Grandmother   . Crohn's disease Paternal Aunt   . Crohn's disease Paternal Aunt   . Crohn's disease Paternal Uncle   . Colon cancer Paternal Uncle   . Cancer Maternal Aunt        "stomach cancer"  . Celiac disease Neg Hx      OBJECTIVE:  Vitals:   06/14/19 1520  BP: 93/66  Pulse: 84  Resp: 16  Temp: 98.4 F (36.9 C)  TempSrc: Oral  SpO2: 97%     General appearance: alert; appears fatigued, but nontoxic; speaking in full sentences and tolerating own secretions HEENT: NCAT; Ears: EACs clear, TMs pearly gray; Eyes: PERRL.  EOM grossly intact. Nose: nares patent without rhinorrhea, Throat: oropharynx clear, tonsils non erythematous or enlarged, uvula midline  Neck: supple without LAD Lungs: unlabored respirations, symmetrical air entry; cough: absent; no respiratory distress; CTAB Heart: regular rate and rhythm.   Skin: warm and dry Psychological: alert and cooperative; normal mood and affect  ASSESSMENT & PLAN:  1. Suspected COVID-19 virus infection   2. Viral URI with cough     Meds ordered this encounter  Medications  . cetirizine (ZYRTEC) 10 MG tablet    Sig: Take 1 tablet (10 mg total) by mouth daily.    Dispense:  30 tablet    Refill:  0    Order Specific Question:   Supervising Provider    Answer:   Raylene Everts [id number]  . fluticasone (FLONASE) 50 MCG/ACT nasal spray    Sig: Place 2 sprays into both nostrils daily.    Dispense:  16 g    Refill:  0    Order Specific Question:   Supervising Provider    Answer:   Raylene Everts [id number]  . benzonatate (TESSALON) 100 MG capsule    Sig: Take 1 capsule (100 mg total) by mouth every 8 (eight) hours.    Dispense:  21 capsule    Refill:  0    Order Specific Question:   Supervising Provider    Answer:   Raylene Everts [id number]  . albuterol (VENTOLIN HFA) 108 (90 Base) MCG/ACT inhaler    Sig: Inhale 1-2 puffs into the lungs every 6 (six) hours as needed for wheezing or shortness of breath.    Dispense:  18 g    Refill:  0    Order Specific Question:   Supervising Provider    Answer:   Raylene Everts [id number]   COVID testing ordered.  It will take between 5-7 days for test results.  Someone will contact you  regarding abnormal results.    In the meantime: You should remain isolated in your home for 10 days from symptom onset AND greater than 72 hours after symptoms resolution (absence of fever without the use of fever-reducing medication and improvement in respiratory symptoms), whichever is longer Get plenty  of rest and push fluids Tessalon Perles prescribed for cough Albuterol for chest tightness and/or SOB Use OTC zyrtec for nasal congestion, runny nose, and/or sore throat Use OTC flonase for nasal congestion and runny nose Use medications daily for symptom relief Use OTC medications like ibuprofen or tylenol as needed fever or pain Call or go to the ED if you have any new or worsening symptoms such as fever, worsening cough, shortness of breath, chest tightness, chest pain, turning blue, changes in mental status, etc...    Reviewed expectations re: course of current medical issues. Questions answered. Outlined signs and symptoms indicating need for more acute intervention. Patient verbalized understanding. After Visit Summary given.         Lestine Box, PA-C 06/14/19 1547

## 2019-06-14 NOTE — ED Triage Notes (Addendum)
Pt presents to UC w/ c/o of cough, runny nose, and fatigue x1 week. Pt had positive covid exposure last week. Pt states she feels occasional chest tightness.  Pt states she would like a work note.

## 2019-06-14 NOTE — Discharge Instructions (Addendum)
COVID testing ordered.  It will take between 5-7 days for test results.  Someone will contact you regarding abnormal results.    In the meantime: You should remain isolated in your home for 10 days from symptom onset AND greater than 72 hours after symptoms resolution (absence of fever without the use of fever-reducing medication and improvement in respiratory symptoms), whichever is longer Get plenty of rest and push fluids Tessalon Perles prescribed for cough Albuterol for chest tightness and/or SOB Use OTC zyrtec for nasal congestion, runny nose, and/or sore throat Use OTC flonase for nasal congestion and runny nose Use medications daily for symptom relief Use OTC medications like ibuprofen or tylenol as needed fever or pain Call or go to the ED if you have any new or worsening symptoms such as fever, worsening cough, shortness of breath, chest tightness, chest pain, turning blue, changes in mental status, etc..Marland Kitchen

## 2019-06-15 LAB — NOVEL CORONAVIRUS, NAA: SARS-CoV-2, NAA: NOT DETECTED

## 2019-07-08 ENCOUNTER — Other Ambulatory Visit: Payer: Self-pay

## 2019-07-08 DIAGNOSIS — F3132 Bipolar disorder, current episode depressed, moderate: Secondary | ICD-10-CM | POA: Diagnosis not present

## 2019-07-09 ENCOUNTER — Ambulatory Visit (INDEPENDENT_AMBULATORY_CARE_PROVIDER_SITE_OTHER): Payer: Medicaid Other | Admitting: Family

## 2019-07-09 ENCOUNTER — Encounter: Payer: Self-pay | Admitting: Family

## 2019-07-09 DIAGNOSIS — Z0000 Encounter for general adult medical examination without abnormal findings: Secondary | ICD-10-CM | POA: Diagnosis not present

## 2019-07-09 DIAGNOSIS — F331 Major depressive disorder, recurrent, moderate: Secondary | ICD-10-CM

## 2019-07-09 DIAGNOSIS — Z6841 Body Mass Index (BMI) 40.0 and over, adult: Secondary | ICD-10-CM

## 2019-07-09 DIAGNOSIS — A6000 Herpesviral infection of urogenital system, unspecified: Secondary | ICD-10-CM | POA: Diagnosis not present

## 2019-07-09 DIAGNOSIS — K219 Gastro-esophageal reflux disease without esophagitis: Secondary | ICD-10-CM

## 2019-07-09 DIAGNOSIS — F17200 Nicotine dependence, unspecified, uncomplicated: Secondary | ICD-10-CM | POA: Diagnosis not present

## 2019-07-09 MED ORDER — VALACYCLOVIR HCL 1 G PO TABS: 1000.0000 mg | ORAL_TABLET | Freq: Every day | ORAL | 1 refills | Status: DC

## 2019-07-09 NOTE — Patient Instructions (Addendum)
Intermittent Explosive Disorder Intermittent explosive disorder (IED) is a mental health disorder in which a person has trouble controlling his or her angry impulses. A person with this disorder may repeatedly have sudden episodes of aggression (explosive rages). The rages are unplanned and too much for the situation. They can result in serious injury to people or animals, or property damage. IED may interfere with relationships, school, or work, and may cause legal or financial problems. Intermittent explosive disorder most commonly starts in childhood or the teenage years. It may go away after 10-20 years or may last for life. People with IED often have one or more other mental disorders, such as other impulse control disorders, depression, anxiety, or substance abuse. People with this disorder are also at higher risk for self-injury and suicide attempts. What are the causes? The cause of IED is not known. What increases the risk? You are more likely to develop this condition if:  You are female.  You were abused during childhood.  You have a family member who has IED, a different impulse control disorder, or other mental health issues. What are the signs or symptoms? Explosive rages may be verbal, physical, or both. Examples of verbal rages may include:  Temper tantrums after the age of 68 years.  Verbal arguments or fights.  Vicious name calling, cursing, or insulting others.  Road rage. Physical rages are directed at people, animals, or property. They may or may not cause injury or damage. Examples of physical aggression may include:  Pushing, slapping, punching, choking, or kicking.  Punching holes in walls, breaking furniture, or throwing items. Verbal or physical rages:  May happen an average of twice a week for 3 months or longer. Rages causing physical injury or property damage may occur at least three times a year.  Usually last less than 30 minutes. People with IED may want  to change their behavior but are often unable to monitor and control their outbursts by themselves. You may feel remorseful or embarrassed after a rage. In between rages, there may be no signs of anger, or you may be irritable. Before or during a rage, you may have one or more of the following sensations:  Tingling.  Shaking (tremors).  Pounding heart.  Chest tightness.  Head pressure.  Hearing an echo. How is this diagnosed? Intermittent explosive disorder is diagnosed through an assessment by your health care provider. He or she may ask about:  Your rages and how they affect your life.  Your moods, thoughts, and other behaviors.  Your medical history.  Your use of medicines or drugs. Your health care provider may do a physical exam and order lab tests or brain imaging tests. You may be evaluated by a mental health professional. How is this treated? This condition is treated by mental health specialists. Treatment may include:  Therapy. You may have therapy that: ? Focuses on your underlying feelings and reasons that lead to rages. ? Evaluates your triggers for rages and ways to prevent or control them (cognitive behavioral therapy, CBT). CBT in a group setting may be especially helpful.  Support groups. These can provide emotional support, advice, and guidance.  Medicine to help reduce the frequency and severity of explosive rages. There are several types of medicines that may be used, including certain antidepressants, mood stabilizers, anti-seizure medicines (anticonvulsants), major tranquilizers (antipsychotics), and beta blockers. The most effective treatment is usually a combination of therapy, support groups, and medicine. You will also be treated for any other mental  health problems you have. Follow these instructions at home:   Take over-the-counter and prescription medicines only as told by your health care provider.  Do not start taking any new over-the-counter or  prescription medicines before first getting approval from your health care provider.  Attend therapy and support groups as recommended.  Try to avoid situations and people that upset you.  Keep all follow-up visits as told by your health care provider. This is important. Contact a health care provider if you:  Have worsening symptoms.  Are not able to take your medicine as told. Get help right away if you have:  Thoughts about hurting yourself or someone else.  Seriously injured yourself or someone else. If you ever feel like you may hurt yourself or others, or have thoughts about taking your own life, get help right away. You can go to your nearest emergency department or call:  Your local emergency services (911 in the [place]  A suicide crisis helpline, such as the [facility] at (307)718-8183. This is open 24 hours a day. Summary  Intermittent explosive disorder (IED) is a mental health disorder in which a person has trouble controlling his or her angry impulses.  Intermittent explosive disorder most commonly starts in childhood or the teenage years.  People with IED may want to change their behavior but are often unable to monitor and control their outbursts by themselves.  The most effective treatment is usually a combination of therapy, support groups, and medicine. Treatment will also focus on any other mental health problems you have. This information is not intended to replace advice given to you by your health care provider. Make sure you discuss any questions you have with your health care provider. Document Revised: 04/25/2017 Document Reviewed: 03/20/2017 Elsevier Patient Education  2020 [employer].

## 2019-07-09 NOTE — Progress Notes (Signed)
Subjective:    Patient ID: Colleen Valentine, female    DOB: [birth year], 24 y.o.   MRN: [id number]  Chief Complaint  Patient presents with  . Annual Exam    TB Skin test    PT presents to the office today for CPE without pap. She is followed by Gyn. She states yesterday she saw her GYN and discussed her postpartum depression and anxiety. She was started on  Seroquel 50 mg yesterday and placed a referral to behavorial health.  Anxiety Presents for follow-up visit. Symptoms include decreased concentration, depressed mood, excessive worry, irritability, nervous/anxious behavior, obsessions, panic and restlessness. Patient reports no compulsions. Primary symptoms comment: rage. Symptoms occur most days. The severity of symptoms is moderate. The quality of sleep is good.    Depression      (Rage)  This is a chronic problem.  The current episode started more than 1 year ago.   The onset quality is gradual.   The problem occurs intermittently.  Associated symptoms include decreased concentration, fatigue, helplessness, hopelessness, irritable, restlessness and sad.  Treatments tried: seroquel.  Past medical history includes anxiety.   Herpes  Pt states she is having outbreaks every 2 weeks. She reports she has only been taking as needed.     Review of Systems  Constitutional: Positive for fatigue and irritability.  Psychiatric/Behavioral: Positive for decreased concentration and depression. The patient is nervous/anxious.   All other systems reviewed and are negative.  Family History  Problem Relation Age of Onset  . Alcohol abuse Mother   . Stroke Mother   . Bipolar disorder Mother   . Alcohol abuse Father   . Bipolar disorder Father   . Colon cancer Maternal Grandfather   . Crohn's disease Paternal Grandmother   . Crohn's disease Paternal Aunt   . Crohn's disease Paternal Aunt   . Crohn's disease Paternal Uncle   . Colon cancer Paternal Uncle   . Cancer Maternal Aunt        "stomach  cancer"  . Celiac disease Neg Hx    Social History   Socioeconomic History  . Marital status: Single    Spouse name: Not on file  . Number of children: Not on file  . Years of education: Not on file  . Highest education level: Not on file  Occupational History  . 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 Smoking status: Current Every Day Smoker    Packs/day: 0.50    Years: 5.00    Pack years: 2.50    Types: Cigarettes  . Smokeless tobacco: Never Used  . Tobacco comment: one pack cigarettes daily  Substance and Sexual Activity  . Alcohol use: Yes    Alcohol/week: 0.0 standard drinks    Comment: social  . Drug use: Yes    Types: Marijuana  . Sexual activity: Not Currently    Partners: Male    Birth control/protection: Other-see comments  Other Topics Concern  . Not on file  Social History Narrative   ** Merged History Encounter **       Social Determinants of Health   Financial Resource Strain:   . Difficulty of Paying Living Expenses: Not on file  Food Insecurity:   . Worried About Charity fundraiser in the Last Year: Not on file  . Ran Out of Food in the Last Year: Not on file  Transportation Needs:   . Lack of Transportation (Medical): Not on file  .  Lack of Transportation (Non-Medical): Not on file  Physical Activity:   . Days of Exercise per Week: Not on file  . Minutes of Exercise per Session: Not on file  Stress:   . Feeling of Stress : Not on file  Social Connections:   . Frequency of Communication with Friends and Family: Not on file  . Frequency of Social Gatherings with Friends and Family: Not on file  . Attends Religious Services: Not on file  . Active Member of Clubs or Organizations: Not on file  . Attends Archivist Meetings: Not on file  . Marital Status: Not on file       Objective:   Physical Exam Vitals reviewed.  Constitutional:      General: She is irritable. She is not in acute  distress.    Appearance: She is well-developed.  HENT:     Head: Normocephalic and atraumatic.     Right Ear: Tympanic membrane and external ear normal.     Left Ear: Tympanic membrane normal.  Eyes:     Pupils: Pupils are equal, round, and reactive to light.  Neck:     Thyroid: No thyromegaly.  Cardiovascular:     Rate and Rhythm: Normal rate and regular rhythm.     Heart sounds: Normal heart sounds. No murmur.  Pulmonary:     Effort: Pulmonary effort is normal. No respiratory distress.     Breath sounds: Normal breath sounds. No wheezing.  Abdominal:     General: Bowel sounds are normal. There is no distension.     Palpations: Abdomen is soft.     Tenderness: There is no abdominal tenderness.  Musculoskeletal:        General: No tenderness. Normal range of motion.     Cervical back: Normal range of motion and neck supple.  Skin:    General: Skin is warm and dry.  Neurological:     Mental Status: She is alert and oriented to person, place, and time.     Cranial Nerves: No cranial nerve deficit.     Deep Tendon Reflexes: Reflexes are normal and symmetric.  Psychiatric:        Behavior: Behavior normal.        Thought Content: Thought content normal.        Judgment: Judgment normal.     BP 107/72   Temp (!) 97 F (36.1 C) (Temporal)   Ht '5\' 5"'  (1.651 m)   Wt 237 lb (107.5 kg)   SpO2 99%   BMI 39.44 kg/m        Assessment & Plan:  Diya Gervasi comes in today with chief complaint of Annual Exam (TB Skin test )   Diagnosis and orders addressed:  1. Annual physical exam - CMP14+EGFR - CBC with Differential/Platelet - Lipid panel - TSH - PPD  2. Current smoker Smoking cessation discussed - CMP14+EGFR - CBC with Differential/Platelet  3. Gastroesophageal reflux disease without esophagitis - CMP14+EGFR - CBC with Differential/Platelet  4. Genital herpes simplex, unspecified site - valACYclovir (VALTREX) 1000 MG tablet; Take 1 tablet (1,000 mg total) by  mouth daily.  Dispense: 90 tablet; Refill: 1 - CMP14+EGFR - CBC with Differential/Platelet  5. MDD (major depressive disorder), recurrent episode, moderate (HCC) - CMP14+EGFR - CBC with Differential/Platelet  6. Morbid obesity with BMI of 40.0-44.9, adult ([place]) - CMP14+EGFR - CBC with Differential/Platelet   Labs pending Health Maintenance reviewed Diet and exercise encouraged  Follow up plan: 6 months and keep follow up  with GYN and behavioral health appointments    Evelina Dun, FNP

## 2019-07-10 LAB — LIPID PANEL
Chol/HDL Ratio: 4.3 ratio (ref 0.0–4.4)
Cholesterol, Total: 164 mg/dL (ref 100–199)
HDL: 38 mg/dL — ABNORMAL LOW (ref >39)
LDL Chol Calc (NIH): 92 mg/dL (ref 0–99)
Triglycerides: 199 mg/dL — ABNORMAL HIGH (ref 0–149)
VLDL Cholesterol Cal: 34 mg/dL (ref 5–40)

## 2019-07-10 LAB — CMP14+EGFR
ALT: 36 IU/L — ABNORMAL HIGH (ref 0–32)
AST: 18 IU/L (ref 0–40)
Albumin/Globulin Ratio: 1.4 (ref 1.2–2.2)
Albumin: 4.2 g/dL (ref 3.9–5.0)
Alkaline Phosphatase: 92 IU/L (ref 39–117)
BUN/Creatinine Ratio: 13 (ref 9–23)
BUN: 11 mg/dL (ref 6–20)
Bilirubin Total: 0.2 mg/dL (ref 0.0–1.2)
CO2: 23 mmol/L (ref 20–29)
Calcium: 9.4 mg/dL (ref 8.7–10.2)
Chloride: 104 mmol/L (ref 96–106)
Creatinine, Ser: 0.82 mg/dL (ref 0.57–1.00)
GFR calc Af Amer: 118 mL/min/{1.73_m2} (ref >59)
GFR calc non Af Amer: 103 mL/min/{1.73_m2} (ref >59)
Globulin, Total: 2.9 g/dL (ref 1.5–4.5)
Glucose: 86 mg/dL (ref 65–99)
Potassium: 3.8 mmol/L (ref 3.5–5.2)
Sodium: 141 mmol/L (ref 134–144)
Total Protein: 7.1 g/dL (ref 6.0–8.5)

## 2019-07-10 LAB — CBC WITH DIFFERENTIAL/PLATELET
Basophils Absolute: 0 10*3/uL (ref 0.0–0.2)
Basos: 0 %
EOS (ABSOLUTE): 0.3 10*3/uL (ref 0.0–0.4)
Eos: 4 %
Hematocrit: 40.7 % (ref 34.0–46.6)
Hemoglobin: 13.2 g/dL (ref 11.1–15.9)
Immature Grans (Abs): 0 10*3/uL (ref 0.0–0.1)
Immature Granulocytes: 0 %
Lymphocytes Absolute: 2.2 10*3/uL (ref 0.7–3.1)
Lymphs: 29 %
MCH: 27.2 pg (ref 26.6–33.0)
MCHC: 32.4 g/dL (ref 31.5–35.7)
MCV: 84 fL (ref 79–97)
Monocytes Absolute: 0.5 10*3/uL (ref 0.1–0.9)
Monocytes: 6 %
Neutrophils Absolute: 4.6 10*3/uL (ref 1.4–7.0)
Neutrophils: 61 %
Platelets: 283 10*3/uL (ref 150–450)
RBC: 4.85 x10E6/uL (ref 3.77–5.28)
RDW: 15.3 % (ref 11.7–15.4)
WBC: 7.6 10*3/uL (ref 3.4–10.8)

## 2019-07-10 LAB — TSH: TSH: 1.03 u[IU]/mL (ref 0.450–4.500)

## 2019-07-28 ENCOUNTER — Ambulatory Visit (INDEPENDENT_AMBULATORY_CARE_PROVIDER_SITE_OTHER): Payer: Medicaid Other

## 2019-07-28 ENCOUNTER — Other Ambulatory Visit: Payer: Self-pay

## 2019-07-28 DIAGNOSIS — Z7251 High risk heterosexual behavior: Secondary | ICD-10-CM | POA: Diagnosis not present

## 2019-07-28 DIAGNOSIS — Z111 Encounter for screening for respiratory tuberculosis: Secondary | ICD-10-CM | POA: Diagnosis not present

## 2019-07-28 LAB — WET PREP FOR TRICH, YEAST, CLUE
Clue Cell Exam: POSITIVE — AB
Trichomonas Exam: NEGATIVE
Yeast Exam: NEGATIVE

## 2019-07-28 NOTE — Progress Notes (Signed)
TB skin test placed to right forearm.  Patient tolerated well.  Patient instructed to return in 2 days (Friday) to have test read.

## 2019-07-29 ENCOUNTER — Other Ambulatory Visit: Payer: Self-pay | Admitting: Family

## 2019-07-29 MED ORDER — METRONIDAZOLE 500 MG PO TABS: 500.0000 mg | ORAL_TABLET | Freq: Two times a day (BID) | ORAL | 0 refills | Status: DC

## 2019-07-30 LAB — TB SKIN TEST
Induration: 0 mm
TB Skin Test: NEGATIVE

## 2019-08-04 ENCOUNTER — Telehealth: Payer: Self-pay | Admitting: Family

## 2019-08-04 LAB — CHLAMYDIA/GONOCOCCUS/TRICHOMONAS, NAA
Chlamydia by NAA: POSITIVE — AB
Gonococcus by NAA: NEGATIVE
Trich vag by NAA: NEGATIVE

## 2019-08-04 MED ORDER — FLUCONAZOLE 150 MG PO TABS: 150.0000 mg | ORAL_TABLET | ORAL | 0 refills | Status: DC | PRN

## 2019-08-04 MED ORDER — AZITHROMYCIN 250 MG PO TABS: 1000.0000 mg | ORAL_TABLET | Freq: Once | ORAL | 0 refills | Status: AC

## 2019-08-04 NOTE — Telephone Encounter (Signed)
Prescription sent to pharmacy.

## 2019-08-04 NOTE — Telephone Encounter (Signed)
This is my fault, it said pending and I thought it was complete. Her chlamydia was positive. Azithromycin 1 g Prescription sent to pharmacy. Needs to be retested in 3 months

## 2019-08-04 NOTE — Telephone Encounter (Signed)
Please review results. Patient notified 08/02/19 Chlamydia was negative and today she got a result that it is positive. Please review

## 2019-08-04 NOTE — Telephone Encounter (Signed)
PATIENT AWARE OF RESULTS, ASKING FOR A COUPLE DIFLUCAN BECAUSE ANTIBIOTICS GIVE HER YEAST INFECTIONS

## 2019-08-05 ENCOUNTER — Telehealth: Payer: Self-pay | Admitting: Family

## 2019-08-09 MED ORDER — FLUCONAZOLE 150 MG PO TABS: 150.0000 mg | ORAL_TABLET | ORAL | 0 refills | Status: DC | PRN

## 2019-08-09 MED ORDER — AZITHROMYCIN 250 MG PO TABS: 1000.0000 mg | ORAL_TABLET | Freq: Once | ORAL | 0 refills | Status: AC

## 2019-08-09 NOTE — Telephone Encounter (Signed)
Prescription sent to pharmacy.

## 2019-08-09 NOTE — Telephone Encounter (Signed)
Left message ,  new medications sent in, to please call our office for any questions.

## 2019-08-10 NOTE — Telephone Encounter (Signed)
Left message to please call our office. 

## 2019-08-10 NOTE — Telephone Encounter (Signed)
Form with information on positive results [facility].

## 2019-08-12 ENCOUNTER — Other Ambulatory Visit: Payer: Self-pay

## 2019-08-12 ENCOUNTER — Emergency Department (HOSPITAL_COMMUNITY): Payer: Medicaid Other

## 2019-08-12 ENCOUNTER — Emergency Department (HOSPITAL_COMMUNITY)
Admission: EM | Admit: 2019-08-12 | Discharge: 2019-08-12 | Disposition: A | Discharge status: Home / Self Care | Payer: Medicaid Other | Attending: Emergency Medicine | Admitting: Emergency Medicine

## 2019-08-12 DIAGNOSIS — F17210 Nicotine dependence, cigarettes, uncomplicated: Secondary | ICD-10-CM | POA: Diagnosis not present

## 2019-08-12 DIAGNOSIS — Y92410 Unspecified street and highway as the place of occurrence of the external cause: Secondary | ICD-10-CM | POA: Diagnosis not present

## 2019-08-12 DIAGNOSIS — T148XXA Other injury of unspecified body region, initial encounter: Secondary | ICD-10-CM

## 2019-08-12 DIAGNOSIS — F419 Anxiety disorder, unspecified: Secondary | ICD-10-CM | POA: Diagnosis not present

## 2019-08-12 DIAGNOSIS — Y999 Unspecified external cause status: Secondary | ICD-10-CM | POA: Diagnosis not present

## 2019-08-12 DIAGNOSIS — Z79899 Other long term (current) drug therapy: Secondary | ICD-10-CM | POA: Insufficient documentation

## 2019-08-12 DIAGNOSIS — M546 Pain in thoracic spine: Secondary | ICD-10-CM | POA: Diagnosis not present

## 2019-08-12 DIAGNOSIS — S161XXA Strain of muscle, fascia and tendon at neck level, initial encounter: Secondary | ICD-10-CM | POA: Insufficient documentation

## 2019-08-12 DIAGNOSIS — M542 Cervicalgia: Secondary | ICD-10-CM

## 2019-08-12 DIAGNOSIS — Y9389 Activity, other specified: Secondary | ICD-10-CM | POA: Diagnosis not present

## 2019-08-12 DIAGNOSIS — M549 Dorsalgia, unspecified: Secondary | ICD-10-CM | POA: Diagnosis not present

## 2019-08-12 DIAGNOSIS — S199XXA Unspecified injury of neck, initial encounter: Secondary | ICD-10-CM | POA: Diagnosis present

## 2019-08-12 DIAGNOSIS — R519 Headache, unspecified: Secondary | ICD-10-CM | POA: Diagnosis not present

## 2019-08-12 MED ORDER — ONDANSETRON 4 MG PO TBDP
4.0000 mg | ORAL_TABLET | Freq: Once | ORAL | Status: AC
  Administered 2019-08-12: 4 mg via ORAL
  Filled 2019-08-12: qty 1

## 2019-08-12 MED ORDER — METHOCARBAMOL 500 MG PO TABS: 500.0000 mg | ORAL_TABLET | Freq: Two times a day (BID) | ORAL | 0 refills | Status: DC

## 2019-08-12 MED ORDER — HYDROCODONE-ACETAMINOPHEN 5-325 MG PO TABS
1.0000 | ORAL_TABLET | Freq: Once | ORAL | Status: AC
  Administered 2019-08-12: 1 via ORAL
  Filled 2019-08-12: qty 1
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
## 2019-08-12 NOTE — Discharge Instructions (Addendum)

## 2019-08-12 NOTE — ED Notes (Signed)
Pt discharge instructions and prescriptions reviewed with the patient. The patient verbalized understanding of both. Pt discharged. 

## 2019-09-03 DIAGNOSIS — Z3202 Encounter for pregnancy test, result negative: Secondary | ICD-10-CM | POA: Diagnosis not present

## 2019-09-03 DIAGNOSIS — Z113 Encounter for screening for infections with a predominantly sexual mode of transmission: Secondary | ICD-10-CM | POA: Diagnosis not present

## 2019-09-03 DIAGNOSIS — N898 Other specified noninflammatory disorders of vagina: Secondary | ICD-10-CM | POA: Diagnosis not present

## 2019-09-06 NOTE — Progress Notes (Deleted)
Psychiatric Initial Adult Assessment   Patient Identification: Clarabella Noser MRN:  [id number] Date of Evaluation:  09/06/2019 Referral Source: *** Chief Complaint:   Visit Diagnosis: No diagnosis found.  History of Present Illness:   Renaya Holthaus is a 24 y.o. year old female with a history of depression, oppositional disorder, cluster B traits, GERD, who is referred for depression.   ? Breast feeding Per chart review, she was started on quetiapine 25 mg at night.  sertarline History of substance use,     Associated Signs/Symptoms: Depression Symptoms:  {DEPRESSION SYMPTOMS:20000} (Hypo) Manic Symptoms:  {BHH MANIC SYMPTOMS:22872} Anxiety Symptoms:  {BHH ANXIETY SYMPTOMS:22873} Psychotic Symptoms:  {BHH PSYCHOTIC SYMPTOMS:22874} PTSD Symptoms: {BHH PTSD SYMPTOMS:22875}  Past Psychiatric History:  Outpatient:  Psychiatry admission:  Previous suicide attempt:  Past trials of medication: citalopram, escitalopram. bupropion History of violence:   Previous Psychotropic Medications: {YES/NO:21197}  Substance Abuse History in the last 12 months:  {yes no:314532}  Consequences of Substance Abuse: {BHH CONSEQUENCES OF SUBSTANCE ABUSE:22880}  Past Medical History:  Past Medical History:  Diagnosis Date  . Anxiety   . Complication of anesthesia   . Depression   . GERD (gastroesophageal reflux disease)   . Insomnia   . Obesity   . PONV (postoperative nausea and vomiting)   . Seizures ([place])    once in 2016 due to alcohol poisioning  . Suicide [facility])     Past Surgical History:  Procedure Laterality Date  . CHOLECYSTECTOMY  2015  . COSMETIC SURGERY  Age 67   dog bite to face    Family Psychiatric History: ***  Family History:  Family History  Problem Relation Age of Onset  . Alcohol abuse Mother   . Stroke Mother   . Bipolar disorder Mother   . Alcohol abuse Father   . Bipolar disorder Father   . Colon cancer Maternal Grandfather   . Crohn's disease Paternal  Grandmother   . Crohn's disease Paternal Aunt   . Crohn's disease Paternal Aunt   . Crohn's disease Paternal Uncle   . Colon cancer Paternal Uncle   . Cancer Maternal Aunt        "stomach cancer"  . Celiac disease Neg Hx     Social History:   Social History   Socioeconomic History  . Marital status: Single    Spouse name: Not on file  . Number of children: Not on file  . Years of education: Not on file  . Highest education level: Not on file  Occupational History  . 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 Smoking status: Current Every Day Smoker    Packs/day: 0.50    Years: 5.00    Pack years: 2.50    Types: Cigarettes  . Smokeless tobacco: Never Used  . Tobacco comment: one pack cigarettes daily  Substance and Sexual Activity  . Alcohol use: Yes    Alcohol/week: 0.0 standard drinks    Comment: social  . Drug use: Yes    Types: Marijuana  . Sexual activity: Not Currently    Partners: Male    Birth control/protection: Other-see comments  Other Topics Concern  . Not on file  Social History Narrative   ** Merged History Encounter **       Social Determinants of Health   Financial Resource Strain:   . Difficulty of Paying Living Expenses:   Food Insecurity:   . Worried About Charity fundraiser in the Last  Year:   . Ran Out of Food in the Last Year:   Transportation Needs:   . Film/video editor (Medical):   Marland Kitchen Lack of Transportation (Non-Medical):   Physical Activity:   . Days of Exercise per Week:   . Minutes of Exercise per Session:   Stress:   . Feeling of Stress :   Social Connections:   . Frequency of Communication with Friends and Family:   . Frequency of Social Gatherings with Friends and Family:   . Attends Religious Services:   . Active Member of Clubs or Organizations:   . Attends Archivist Meetings:   Marland Kitchen Marital Status:     Additional Social History: ***  Allergies:  No Known  Allergies  Metabolic Disorder Labs: Lab Results  Component Value Date   HGBA1C 5.0 07/21/2012   MPG 97 07/21/2012   MPG 94 04/01/2011   No results found for: PROLACTIN Lab Results  Component Value Date   CHOL 164 07/09/2019   TRIG 199 (H) 07/09/2019   HDL 38 (L) 07/09/2019   CHOLHDL 4.3 07/09/2019   VLDL 12 07/21/2012   LDLCALC 92 07/09/2019   LDLCALC 68 12/16/2015   Lab Results  Component Value Date   TSH 1.030 07/09/2019    Therapeutic Level Labs: No results found for: LITHIUM No results found for: CBMZ No results found for: VALPROATE  Current Medications: Current Outpatient Medications  Medication Sig Dispense Refill  . albuterol (VENTOLIN HFA) 108 (90 Base) MCG/ACT inhaler Inhale 1-2 puffs into the lungs every 6 (six) hours as needed for wheezing or shortness of breath. 18 g 0  . fluconazole (DIFLUCAN) 150 MG tablet Take 1 tablet (150 mg total) by mouth every three (3) days as needed. 3 tablet 0  . methocarbamol (ROBAXIN) 500 MG tablet Take 1 tablet (500 mg total) by mouth 2 (two) times daily. 20 tablet 0  . metroNIDAZOLE (FLAGYL) 500 MG tablet Take 1 tablet (500 mg total) by mouth 2 (two) times daily. 14 tablet 0  . QUEtiapine (SEROQUEL) 50 MG tablet Take by mouth.    . valACYclovir (VALTREX) 1000 MG tablet Take 1 tablet (1,000 mg total) by mouth daily. 90 tablet 1   No current facility-administered medications for this visit.    Musculoskeletal: Strength & Muscle Tone: N/A Gait & Station: N/A Patient leans: N/A  Psychiatric Specialty Exam: Review of Systems  unknown if currently breastfeeding.There is no height or weight on file to calculate BMI.  General Appearance: {Appearance:22683}  Eye Contact:  {BHH EYE CONTACT:22684}  Speech:  Clear and Coherent  Volume:  Normal  Mood:  {BHH MOOD:22306}  Affect:  {Affect (PAA):22687}  Thought Process:  Coherent  Orientation:  Full (Time, Place, and Person)  Thought Content:  Logical  Suicidal Thoughts:  {ST/HT  (PAA):22692}  Homicidal Thoughts:  {ST/HT (PAA):22692}  Memory:  Immediate;   Good  Judgement:  {Judgement (PAA):22694}  Insight:  {Insight (PAA):22695}  Psychomotor Activity:  Normal  Concentration:  Concentration: Good and Attention Span: Good  Recall:  Good  Fund of Knowledge:Good  Language: Good  Akathisia:  No  Handed:  Right  AIMS (if indicated):  not done  Assets:  Communication Skills Desire for Improvement  ADL's:  Intact  Cognition: WNL  Sleep:  {BHH GOOD/FAIR/POOR:22877}   Screenings: AUDIT     ED to Hosp-Admission (Discharged) from 07/20/2012 in [place] CHILD/[facility]  Alcohol Use Disorder Identification Test Final Score (AUDIT)  0    GAD-7  Office Visit from 07/09/2019 in [facility]  Total GAD-7 Score  21    PHQ2-9     Office Visit from 07/09/2019 in [place] Visit from 05/07/2019 in [place] Visit from 06/25/2018 in [place] Visit from 05/25/2018 in [place] Visit from 05/15/2018 in [place]  PHQ-2 Total Score  6  0  0  6  0  PHQ-9 Total Score  23  --  --  20  --      Assessment and Plan:  Assessment  Plan  The patient demonstrates the following risk factors for suicide: Chronic risk factors for suicide include: {Chronic Risk Factors for AS:6451928. Acute risk factors for suicide include: {Acute Risk Factors for SW:8008971. Protective factors for this patient include: {Protective Factors for Suicide CJ:814540. Considering these factors, the overall suicide risk at this point appears to be {Desc; low/moderate/high:110033}. Patient {ACTION; IS/IS VG:4697475 appropriate for outpatient follow up.   Norman Clay, MD 4/12/202110:28 AM

## 2019-09-08 ENCOUNTER — Ambulatory Visit (HOSPITAL_COMMUNITY): Payer: Medicaid Other | Admitting: Psychiatry

## 2019-09-08 ENCOUNTER — Other Ambulatory Visit: Payer: Self-pay

## 2019-09-27 DIAGNOSIS — K644 Residual hemorrhoidal skin tags: Secondary | ICD-10-CM | POA: Diagnosis not present

## 2019-10-12 DIAGNOSIS — Z3201 Encounter for pregnancy test, result positive: Secondary | ICD-10-CM | POA: Diagnosis not present

## 2019-10-12 DIAGNOSIS — N912 Amenorrhea, unspecified: Secondary | ICD-10-CM | POA: Diagnosis not present

## 2019-10-12 DIAGNOSIS — N898 Other specified noninflammatory disorders of vagina: Secondary | ICD-10-CM | POA: Diagnosis not present

## 2019-10-20 DIAGNOSIS — Z3201 Encounter for pregnancy test, result positive: Secondary | ICD-10-CM | POA: Diagnosis not present

## 2019-10-20 DIAGNOSIS — O3481 Maternal care for other abnormalities of pelvic organs, first trimester: Secondary | ICD-10-CM | POA: Diagnosis not present

## 2019-10-20 DIAGNOSIS — Z3A01 Less than 8 weeks gestation of pregnancy: Secondary | ICD-10-CM | POA: Diagnosis not present

## 2019-10-20 DIAGNOSIS — A6000 Herpesviral infection of urogenital system, unspecified: Secondary | ICD-10-CM | POA: Diagnosis not present

## 2019-10-20 DIAGNOSIS — F418 Other specified anxiety disorders: Secondary | ICD-10-CM | POA: Diagnosis not present

## 2019-10-20 DIAGNOSIS — F3132 Bipolar disorder, current episode depressed, moderate: Secondary | ICD-10-CM | POA: Diagnosis not present

## 2019-10-20 DIAGNOSIS — F902 Attention-deficit hyperactivity disorder, combined type: Secondary | ICD-10-CM | POA: Diagnosis not present

## 2019-10-20 DIAGNOSIS — N912 Amenorrhea, unspecified: Secondary | ICD-10-CM | POA: Diagnosis not present

## 2019-11-12 DIAGNOSIS — M7989 Other specified soft tissue disorders: Secondary | ICD-10-CM | POA: Diagnosis not present

## 2019-11-12 DIAGNOSIS — S8992XA Unspecified injury of left lower leg, initial encounter: Secondary | ICD-10-CM | POA: Diagnosis not present

## 2019-11-12 DIAGNOSIS — M79605 Pain in left leg: Secondary | ICD-10-CM | POA: Diagnosis not present

## 2019-11-12 DIAGNOSIS — R600 Localized edema: Secondary | ICD-10-CM | POA: Diagnosis not present

## 2019-11-12 DIAGNOSIS — S80212A Abrasion, left knee, initial encounter: Secondary | ICD-10-CM | POA: Diagnosis not present
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
## 2019-11-17 DIAGNOSIS — N898 Other specified noninflammatory disorders of vagina: Secondary | ICD-10-CM | POA: Diagnosis not present

## 2019-11-17 DIAGNOSIS — R300 Dysuria: Secondary | ICD-10-CM | POA: Diagnosis not present

## 2019-11-17 DIAGNOSIS — K649 Unspecified hemorrhoids: Secondary | ICD-10-CM | POA: Diagnosis not present

## 2019-11-17 DIAGNOSIS — Z113 Encounter for screening for infections with a predominantly sexual mode of transmission: Secondary | ICD-10-CM | POA: Diagnosis not present

## 2019-11-17 DIAGNOSIS — N912 Amenorrhea, unspecified: Secondary | ICD-10-CM | POA: Diagnosis not present

## 2019-11-17 DIAGNOSIS — Z3201 Encounter for pregnancy test, result positive: Secondary | ICD-10-CM | POA: Diagnosis not present

## 2019-11-18 ENCOUNTER — Other Ambulatory Visit: Payer: Self-pay

## 2019-11-18 ENCOUNTER — Encounter (HOSPITAL_COMMUNITY): Payer: Self-pay | Admitting: Psychiatry

## 2019-11-18 ENCOUNTER — Telehealth (INDEPENDENT_AMBULATORY_CARE_PROVIDER_SITE_OTHER): Payer: Medicaid Other | Admitting: Psychiatry

## 2019-11-18 DIAGNOSIS — F4310 Post-traumatic stress disorder, unspecified: Secondary | ICD-10-CM

## 2019-11-18 DIAGNOSIS — F410 Panic disorder [episodic paroxysmal anxiety] without agoraphobia: Secondary | ICD-10-CM | POA: Diagnosis not present

## 2019-11-18 MED ORDER — VENLAFAXINE HCL ER 75 MG PO CP24: ORAL_CAPSULE | ORAL | 0 refills | Status: DC

## 2019-11-18 MED ORDER — VENLAFAXINE HCL ER 37.5 MG PO CP24: 37.5000 mg | ORAL_CAPSULE | Freq: Every day | ORAL | 0 refills | Status: DC

## 2019-11-18 MED ORDER — HYDROXYZINE PAMOATE 25 MG PO CAPS: 25.0000 mg | ORAL_CAPSULE | Freq: Every day | ORAL | 1 refills | Status: DC | PRN

## 2019-11-18 NOTE — Patient Instructions (Addendum)
1.  Start venlafaxine 37.5 mg daily for one week, then 75 mg daily  2.  Start hydroxyzine 25 mg daily as needed for anxiety 3.  Referral to therapy  4. Next appointment: 8/5 at 1:30\  CONTACT INFORMATION  What to do if you need to get in touch with someone regarding a psychiatric issue:  1. EMERGENCY: For psychiatric emergencies (if you are suicidal or if there are any other safety issues) call 911 and/or go to your nearest Emergency Room immediately.   2. IF YOU NEED SOMEONE TO TALK TO RIGHT NOW: Given my clinical responsibilities, I may not be able to speak with you over the phone for a prolonged period of time.  a. You may always call The National Suicide Prevention Lifeline at 1-800-273-TALK 7782831493).  b. Your county of residence will also have local crisis services. For [facility]: [place] at [phone] ([place])

## 2019-11-22 DIAGNOSIS — N912 Amenorrhea, unspecified: Secondary | ICD-10-CM | POA: Diagnosis not present

## 2019-11-28 DIAGNOSIS — A599 Trichomoniasis, unspecified: Secondary | ICD-10-CM | POA: Insufficient documentation

## 2019-12-02 ENCOUNTER — Ambulatory Visit (INDEPENDENT_AMBULATORY_CARE_PROVIDER_SITE_OTHER): Payer: Medicaid Other | Admitting: Clinical

## 2019-12-02 ENCOUNTER — Other Ambulatory Visit: Payer: Self-pay

## 2019-12-02 DIAGNOSIS — F4310 Post-traumatic stress disorder, unspecified: Secondary | ICD-10-CM

## 2019-12-02 DIAGNOSIS — F410 Panic disorder [episodic paroxysmal anxiety] without agoraphobia: Secondary | ICD-10-CM

## 2019-12-02 DIAGNOSIS — F319 Bipolar disorder, unspecified: Secondary | ICD-10-CM | POA: Diagnosis not present
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
## 2019-12-23 ENCOUNTER — Telehealth (HOSPITAL_COMMUNITY): Payer: Self-pay | Admitting: Clinical

## 2019-12-23 ENCOUNTER — Other Ambulatory Visit: Payer: Self-pay

## 2019-12-23 ENCOUNTER — Ambulatory Visit (HOSPITAL_COMMUNITY): Payer: Medicaid Other | Admitting: Clinical

## 2019-12-23 NOTE — Telephone Encounter (Signed)
The OPT therapist attempted text to session, then called and left VM, however, the patient did not respond and missed their scheduled session.

## 2019-12-30 ENCOUNTER — Telehealth (HOSPITAL_COMMUNITY): Payer: Medicaid Other | Admitting: Psychiatry

## 2019-12-30 ENCOUNTER — Telehealth (HOSPITAL_COMMUNITY): Payer: Self-pay | Admitting: Psychiatry

## 2019-12-30 ENCOUNTER — Other Ambulatory Visit: Payer: Self-pay

## 2019-12-30 NOTE — Telephone Encounter (Signed)
Sent link for video visit through Epic. Patient did not sign in. Called the patient for appointment scheduled today. The patient did not answer the phone. No option to leave a voice message.  

## 2020-01-01 ENCOUNTER — Ambulatory Visit
Admission: EM | Admit: 2020-01-01 | Discharge: 2020-01-01 | Disposition: A | Discharge status: Home / Self Care | Payer: Medicaid Other | Attending: Emergency Medicine | Admitting: Emergency Medicine

## 2020-01-01 ENCOUNTER — Other Ambulatory Visit: Payer: Self-pay

## 2020-01-01 ENCOUNTER — Encounter: Payer: Self-pay | Admitting: Emergency Medicine

## 2020-01-01 DIAGNOSIS — N949 Unspecified condition associated with female genital organs and menstrual cycle: Secondary | ICD-10-CM | POA: Diagnosis not present

## 2020-01-01 DIAGNOSIS — N898 Other specified noninflammatory disorders of vagina: Secondary | ICD-10-CM

## 2020-01-01 LAB — POCT URINE PREGNANCY: Preg Test, Ur: NEGATIVE

## 2020-01-01 MED ORDER — CLINDAMYCIN HCL 300 MG PO CAPS: 300.0000 mg | ORAL_CAPSULE | Freq: Two times a day (BID) | ORAL | 0 refills | Status: AC

## 2020-01-01 MED ORDER — TINIDAZOLE 500 MG PO TABS: 2.0000 g | ORAL_TABLET | Freq: Once | ORAL | 0 refills | Status: AC

## 2020-01-01 MED ORDER — FLUCONAZOLE 200 MG PO TABS: ORAL_TABLET | ORAL | 0 refills | Status: DC

## 2020-01-01 MED ORDER — AZITHROMYCIN 500 MG PO TABS
1000.0000 mg | ORAL_TABLET | Freq: Once | ORAL | Status: AC
  Administered 2020-01-01: 1000 mg via ORAL

## 2020-01-01 MED ORDER — CEFTRIAXONE SODIUM 500 MG IJ SOLR
500.0000 mg | Freq: Once | INTRAMUSCULAR | Status: AC
  Administered 2020-01-01: 500 mg via INTRAMUSCULAR

## 2020-01-01 NOTE — ED Provider Notes (Signed)
[facility]   [id number] 01/01/20 Arrival Time: 1700   CC: VAGINAL DISCHARGE  SUBJECTIVE:  Colleen Valentine is a 24 y.o. female who presents with complaints of vaginal discomfort, irritation, and thick white/ yellow discharge x 2-3 days.  Patient's BF tested positive for multiple STDs including gonorrhea, chlamydia, and trich.  Patient also reports she may have had BV prior to exposure. Admits to 67 female partners within the last 6 months.  Reports discomfort with intercourse, but able to tolerate intercourse.  She reports similar symptoms in the past with STDs.  Complains of associated nausea, vaginal odor, and itching.  She denies fever, chills, vomiting, abdominal or pelvic pain, urinary symptoms, vaginal bleeding, dyspareunia, vaginal rashes or lesions.   Patient's last menstrual period was 12/14/2019.  ROS: As per HPI.  All other pertinent ROS negative.     Past Medical History:  Diagnosis Date  . Anxiety   . Complication of anesthesia   . Depression   . GERD (gastroesophageal reflux disease)   . Insomnia   . Obesity   . PONV (postoperative nausea and vomiting)   . Seizures ([place])    once in 2016 due to alcohol poisioning  . Suicide [facility])    Past Surgical History:  Procedure Laterality Date  . CHOLECYSTECTOMY  2015  . COSMETIC SURGERY  Age 89   dog bite to face   No Known Allergies No current facility-administered medications on file prior to encounter.   Current Outpatient Medications on File Prior to Encounter  Medication Sig Dispense Refill  . hydrOXYzine (VISTARIL) 25 MG capsule Take 1 capsule (25 mg total) by mouth daily as needed for anxiety. 30 capsule 1  . valACYclovir (VALTREX) 1000 MG tablet Take 1 tablet (1,000 mg total) by mouth daily. 90 tablet 1  . venlafaxine XR (EFFEXOR-XR) 37.5 MG 24 hr capsule Take 1 capsule (37.5 mg total) by mouth daily with breakfast. 7 capsule 0  . venlafaxine XR (EFFEXOR-XR) 75 MG 24 hr capsule 75 mg daily. Start after 37.5 mg  daily for one week 30 capsule 0  . [DISCONTINUED] albuterol (VENTOLIN HFA) 108 (90 Base) MCG/ACT inhaler Inhale 1-2 puffs into the lungs every 6 (six) hours as needed for wheezing or shortness of breath. (Patient not taking: Reported on 11/18/2019) 18 g 0  . [DISCONTINUED] pantoprazole (PROTONIX) 40 MG tablet Take 1 tablet (40 mg total) by mouth daily before breakfast. 30 tablet 5    Social History   Socioeconomic History  . Marital status: Single    Spouse name: Not on file  . Number of children: Not on file  . Years of education: Not on file  . Highest education level: Not on file  Occupational History  . 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 Smoking status: Current Every Day Smoker    Packs/day: 0.50    Years: 5.00    Pack years: 2.50    Types: Cigarettes  . Smokeless tobacco: Never Used  . Tobacco comment: one pack cigarettes daily  Vaping Use  . Vaping Use: Never used  Substance and Sexual Activity  . Alcohol use: Yes    Alcohol/week: 0.0 standard drinks    Comment: social  . Drug use: Yes    Types: Marijuana  . Sexual activity: Not Currently    Partners: Male    Birth control/protection: Other-see comments  Other Topics Concern  . Not on file  Social History Narrative   ** Merged  History Encounter **       Social Determinants of Health   Financial Resource Strain:   . Difficulty of Paying Living Expenses:   Food Insecurity:   . Worried About Charity fundraiser in the Last Year:   . Arboriculturist in the Last Year:   Transportation Needs:   . Film/video editor (Medical):   Marland Kitchen Lack of Transportation (Non-Medical):   Physical Activity:   . Days of Exercise per Week:   . Minutes of Exercise per Session:   Stress:   . Feeling of Stress :   Social Connections:   . Frequency of Communication with Friends and Family:   . Frequency of Social Gatherings with Friends and Family:   . Attends Religious Services:   .  Active Member of Clubs or Organizations:   . Attends Archivist Meetings:   Marland Kitchen Marital Status:   Intimate Partner Violence:   . Fear of Current or Ex-Partner:   . Emotionally Abused:   Marland Kitchen Physically Abused:   . Sexually Abused:    Family History  Problem Relation Age of Onset  . Alcohol abuse Mother   . Stroke Mother   . Bipolar disorder Mother   . Drug abuse Mother   . Alcohol abuse Father   . Bipolar disorder Father   . Drug abuse Father   . Colon cancer Maternal Grandfather   . Crohn's disease Paternal Grandmother   . Crohn's disease Paternal Aunt   . Crohn's disease Paternal Aunt   . Crohn's disease Paternal Uncle   . Colon cancer Paternal Uncle   . Cancer Maternal Aunt        "stomach cancer"  . Celiac disease Neg Hx     OBJECTIVE:  Vitals:   01/01/20 1000 01/01/20 1002  BP:  111/77  Pulse:  82  Resp:  17  Temp:  98.1 F (36.7 C)  TempSrc:  Oral  SpO2:  98%  Weight: 240 lb (108.9 kg)   Height: 5\' 5"  (1.651 m)      General appearance: Alert, NAD, appears stated age Head: NCAT Throat: lips, mucosa, and tongue normal; teeth and gums normal Lungs: CTA bilaterally without adventitious breath sounds Heart: regular rate and rhythm.   Back: no CVA tenderness Abdomen: soft, mild generalized discomfort; bowel sounds normal; no guarding or rebound tenderness GU: deferred; self-swab obtained Skin: warm and dry Psychological:  Alert and cooperative. Normal mood and affect.  LABS:  No results found for this or any previous visit (from the past 24 hour(s)).   Labs Reviewed  POCT URINE PREGNANCY  CERVICOVAGINAL ANCILLARY ONLY    ASSESSMENT & PLAN:  1. Vaginal discharge   2. Vaginal discomfort     Meds ordered this encounter  Medications  . cefTRIAXone (ROCEPHIN) injection 500 mg  . azithromycin (ZITHROMAX) tablet 1,000 mg  . clindamycin (CLEOCIN) 300 MG capsule    Sig: Take 1 capsule (300 mg total) by mouth in the morning and at bedtime for 7  days.    Dispense:  14 capsule    Refill:  0    Order Specific Question:   Supervising Provider    Answer:   Raylene Everts [[id number]]  . tinidazole (TINDAMAX) 500 MG tablet    Sig: Take 4 tablets (2,000 mg total) by mouth once for 1 dose.    Dispense:  4 tablet    Refill:  0    Order Specific Question:   Supervising Provider  AnswerRaylene Everts [[id number]]  . fluconazole (DIFLUCAN) 200 MG tablet    Sig: Take one dose by mouth, wait 72 hours, and then take second dose by mouth    Dispense:  2 tablet    Refill:  0    Order Specific Question:   Supervising Provider    Answer:   Raylene Everts [[id number]]    Pending: Labs Reviewed  POCT URINE PREGNANCY  CERVICOVAGINAL ANCILLARY ONLY    Vaginal self-swab obtained.  We will follow up with you regarding abnormal results Given rocephin 500 mg injection and azithromycin 1g in office Declines HIV/ syphilis testing today Prescribed clindamycin 300 mg twice x 7 days Prescribed tinidazole 2 g once Prescribed diflucan 200 mg once daily and then second dose 72 hours later Take medications as prescribed and to completion If tests results are positive, please abstain from sexual activity until you and your partner(s) have been treated Follow up with PCP as needed Return here or go to ER if you have any new or worsening symptoms fever, chills, nausea, vomiting, abdominal or pelvic pain, painful intercourse, vaginal discharge, vaginal bleeding, persistent symptoms despite treatment, etc...  Reviewed expectations re: course of current medical issues. Questions answered. Outlined signs and symptoms indicating need for more acute intervention. Patient verbalized understanding. After Visit Summary given.       Lestine Box, PA-C 01/01/20 1029

## 2020-01-01 NOTE — Discharge Instructions (Addendum)
Vaginal self-swab obtained.  We will follow up with you regarding abnormal results Given rocephin 500 mg injection and azithromycin 1g in office Declines HIV/ syphilis testing today Prescribed clindamycin 300 mg twice x 7 days Prescribed tinidazole 2 g once Prescribed diflucan 200 mg once daily and then second dose 72 hours later Take medications as prescribed and to completion If tests results are positive, please abstain from sexual activity until you and your partner(s) have been treated Follow up with PCP as needed Return here or go to ER if you have any new or worsening symptoms fever, chills, nausea, vomiting, abdominal or pelvic pain, painful intercourse, vaginal discharge, vaginal bleeding, persistent symptoms despite treatment, etc..Marland Kitchen

## 2020-01-01 NOTE — ED Triage Notes (Signed)
Pt's boyfriend test positive for multiple std's and pt just found out.  Having vaginal pain, irritation , white and yellow discharge x 2-3 days

## 2020-01-03 LAB — CERVICOVAGINAL ANCILLARY ONLY
Bacterial Vaginitis (gardnerella): NEGATIVE
Candida Glabrata: NEGATIVE
Candida Vaginitis: POSITIVE — AB
Chlamydia: POSITIVE — AB
Comment: NEGATIVE
Comment: NEGATIVE
Comment: NEGATIVE
Comment: NEGATIVE
Comment: NEGATIVE
Comment: NORMAL
Neisseria Gonorrhea: NEGATIVE
Trichomonas: NEGATIVE

## 2020-01-11 DIAGNOSIS — N760 Acute vaginitis: Secondary | ICD-10-CM | POA: Diagnosis not present

## 2020-01-11 DIAGNOSIS — Z113 Encounter for screening for infections with a predominantly sexual mode of transmission: Secondary | ICD-10-CM | POA: Diagnosis not present

## 2020-01-11 DIAGNOSIS — Z202 Contact with and (suspected) exposure to infections with a predominantly sexual mode of transmission: Secondary | ICD-10-CM | POA: Diagnosis not present

## 2020-01-11 DIAGNOSIS — A6004 Herpesviral vulvovaginitis: Secondary | ICD-10-CM | POA: Diagnosis not present

## 2020-01-11 DIAGNOSIS — Z01411 Encounter for gynecological examination (general) (routine) with abnormal findings: Secondary | ICD-10-CM | POA: Diagnosis not present

## 2020-01-24 ENCOUNTER — Encounter (HOSPITAL_COMMUNITY): Payer: Self-pay | Admitting: Obstetrics and Gynecology

## 2020-01-24 ENCOUNTER — Other Ambulatory Visit: Payer: Self-pay

## 2020-01-24 ENCOUNTER — Inpatient Hospital Stay (HOSPITAL_COMMUNITY): Payer: Medicaid Other

## 2020-01-24 ENCOUNTER — Inpatient Hospital Stay (HOSPITAL_COMMUNITY)
Admission: AD | Admit: 2020-01-24 | Discharge: 2020-01-24 | Disposition: A | Discharge status: Home / Self Care | Payer: Medicaid Other | Attending: Obstetrics and Gynecology | Admitting: Obstetrics and Gynecology

## 2020-01-24 DIAGNOSIS — F17210 Nicotine dependence, cigarettes, uncomplicated: Secondary | ICD-10-CM | POA: Diagnosis not present

## 2020-01-24 DIAGNOSIS — O99212 Obesity complicating pregnancy, second trimester: Secondary | ICD-10-CM | POA: Diagnosis not present

## 2020-01-24 DIAGNOSIS — O99341 Other mental disorders complicating pregnancy, first trimester: Secondary | ICD-10-CM | POA: Insufficient documentation

## 2020-01-24 DIAGNOSIS — F1290 Cannabis use, unspecified, uncomplicated: Secondary | ICD-10-CM | POA: Insufficient documentation

## 2020-01-24 DIAGNOSIS — O0739 Failed attempted termination of pregnancy with other complications: Secondary | ICD-10-CM | POA: Diagnosis not present

## 2020-01-24 DIAGNOSIS — Z3A10 10 weeks gestation of pregnancy: Secondary | ICD-10-CM | POA: Diagnosis not present

## 2020-01-24 DIAGNOSIS — O99321 Drug use complicating pregnancy, first trimester: Secondary | ICD-10-CM | POA: Diagnosis not present

## 2020-01-24 DIAGNOSIS — Z79899 Other long term (current) drug therapy: Secondary | ICD-10-CM | POA: Diagnosis not present

## 2020-01-24 DIAGNOSIS — E669 Obesity, unspecified: Secondary | ICD-10-CM | POA: Insufficient documentation

## 2020-01-24 DIAGNOSIS — O209 Hemorrhage in early pregnancy, unspecified: Secondary | ICD-10-CM

## 2020-01-24 DIAGNOSIS — F329 Major depressive disorder, single episode, unspecified: Secondary | ICD-10-CM | POA: Insufficient documentation

## 2020-01-24 DIAGNOSIS — N858 Other specified noninflammatory disorders of uterus: Secondary | ICD-10-CM | POA: Diagnosis not present

## 2020-01-24 DIAGNOSIS — O3680X0 Pregnancy with inconclusive fetal viability, not applicable or unspecified: Secondary | ICD-10-CM | POA: Insufficient documentation

## 2020-01-24 DIAGNOSIS — Z202 Contact with and (suspected) exposure to infections with a predominantly sexual mode of transmission: Secondary | ICD-10-CM | POA: Diagnosis not present

## 2020-01-24 DIAGNOSIS — O26891 Other specified pregnancy related conditions, first trimester: Secondary | ICD-10-CM | POA: Insufficient documentation

## 2020-01-24 DIAGNOSIS — R109 Unspecified abdominal pain: Secondary | ICD-10-CM | POA: Diagnosis not present

## 2020-01-24 DIAGNOSIS — O99331 Smoking (tobacco) complicating pregnancy, first trimester: Secondary | ICD-10-CM | POA: Diagnosis not present

## 2020-01-24 DIAGNOSIS — F419 Anxiety disorder, unspecified: Secondary | ICD-10-CM | POA: Diagnosis not present

## 2020-01-24 DIAGNOSIS — O26899 Other specified pregnancy related conditions, unspecified trimester: Secondary | ICD-10-CM

## 2020-01-24 DIAGNOSIS — O98311 Other infections with a predominantly sexual mode of transmission complicating pregnancy, first trimester: Secondary | ICD-10-CM | POA: Insufficient documentation

## 2020-01-24 DIAGNOSIS — Z3A01 Less than 8 weeks gestation of pregnancy: Secondary | ICD-10-CM | POA: Insufficient documentation

## 2020-01-24 DIAGNOSIS — Z9049 Acquired absence of other specified parts of digestive tract: Secondary | ICD-10-CM | POA: Insufficient documentation

## 2020-01-24 HISTORY — DX: Herpesviral infection of urogenital system, unspecified: A60.00

## 2020-01-24 HISTORY — DX: Anemia, unspecified: D64.9

## 2020-01-24 LAB — URINALYSIS, ROUTINE W REFLEX MICROSCOPIC
Bilirubin Urine: NEGATIVE
Glucose, UA: 150 mg/dL — AB
Hgb urine dipstick: NEGATIVE
Ketones, ur: NEGATIVE mg/dL
Nitrite: NEGATIVE
Protein, ur: NEGATIVE mg/dL
Specific Gravity, Urine: 1.026 (ref 1.005–1.030)
pH: 6 (ref 5.0–8.0)

## 2020-01-24 LAB — COMPREHENSIVE METABOLIC PANEL
ALT: 19 U/L (ref 0–44)
AST: 42 U/L — ABNORMAL HIGH (ref 15–41)
Albumin: 3.7 g/dL (ref 3.5–5.0)
Alkaline Phosphatase: 70 U/L (ref 38–126)
Anion gap: 10 (ref 5–15)
BUN: 10 mg/dL (ref 6–20)
CO2: 25 mmol/L (ref 22–32)
Calcium: 9 mg/dL (ref 8.9–10.3)
Chloride: 105 mmol/L (ref 98–111)
Creatinine, Ser: 0.82 mg/dL (ref 0.44–1.00)
GFR calc Af Amer: >60 mL/min (ref >60)
GFR calc non Af Amer: >60 mL/min (ref >60)
Glucose, Bld: 112 mg/dL — ABNORMAL HIGH (ref 70–99)
Potassium: 4.3 mmol/L (ref 3.5–5.1)
Sodium: 140 mmol/L (ref 135–145)
Total Bilirubin: 1.3 mg/dL — ABNORMAL HIGH (ref 0.3–1.2)
Total Protein: 6.8 g/dL (ref 6.5–8.1)

## 2020-01-24 LAB — CBC
HCT: 39.4 % (ref 36.0–46.0)
Hemoglobin: 13.1 g/dL (ref 12.0–15.0)
MCH: 30.6 pg (ref 26.0–34.0)
MCHC: 33.2 g/dL (ref 30.0–36.0)
MCV: 92.1 fL (ref 80.0–100.0)
Platelets: 247 10*3/uL (ref 150–400)
RBC: 4.28 MIL/uL (ref 3.87–5.11)
RDW: 13.3 % (ref 11.5–15.5)
WBC: 6.5 10*3/uL (ref 4.0–10.5)
nRBC: 0 % (ref 0.0–0.2)

## 2020-01-24 LAB — WET PREP, GENITAL
Clue Cells Wet Prep HPF POC: NONE SEEN
Sperm: NONE SEEN
Trich, Wet Prep: NONE SEEN
Yeast Wet Prep HPF POC: NONE SEEN

## 2020-01-24 LAB — POCT PREGNANCY, URINE: Preg Test, Ur: POSITIVE — AB

## 2020-01-24 LAB — HCG, QUANTITATIVE, PREGNANCY: hCG, Beta Chain, Quant, S: 170 m[IU]/mL — ABNORMAL HIGH (ref <5)

## 2020-01-24 NOTE — MAU Note (Signed)
+  HPT.  Having pelvic pain and pain in her tailbone.  Started about a wk ago, has gotten progressively worse. Had some bleeding 7-10 days ago, came with sex.  Was a lot of blood and then it just stopped.

## 2020-01-24 NOTE — MAU Provider Note (Signed)
History     CSN: [id number]  Arrival date and time: 01/24/20 6237   First Provider Initiated Contact with Patient 01/24/20 1018      Chief Complaint  Patient presents with  . Pelvic Pain  . Back Pain  . Possible Pregnancy   HPI Colleen Valentine is a 24 y.o. G5P1031 at [redacted]w[redacted]d who presents to MAU with chief complaints of abdominal pain, tailbone pain and vaginal bleeding. These are recurrent problems, onset at the beginning of August. She endorses LMP of December 14, 2019.  STI Exposure Patient states she was notified by her ex-boyfriend that he was positive for Trichomonas, Chlamydia and Gonorrhea at the beginning of August. She sought evaluation on August 8 at Urgent Care, was diagnosed with Chlamydia and treated at that time. She reports unprotected sex with a new partner on 08/17 followed by Plan B on 08/18.   Abdominal Pain and Tailbone Pain This is a new problem, onset about two weeks ago. Patient endorses suprapubic pain as well as tailbone pain. She denies abdominal pain on CNM initial assessment. She states she thinks her tailbone pain started after an episode of anal sex, which involved lubricant but no condom.  Vaginal Bleeding Patient endorses new onset heavy vaginal bleeding about 10 days ago, which resolved without intervention yesterday. She states episodes of bleeding typically occurred after intercourse.  OB History    Gravida  5   Para  1   Term  1   Preterm      AB  3   Living  1     SAB  2   TAB  1   Ectopic      Multiple      Live Births  1           Past Medical History:  Diagnosis Date  . Anemia   . Anxiety   . Complication of anesthesia   . Depression   . GERD (gastroesophageal reflux disease)   . Herpes genitalia   . Insomnia   . Obesity   . PONV (postoperative nausea and vomiting)   . Seizures ([place])    once in 2016 due to alcohol poisioning  . Suicide [facility])     Past Surgical History:  Procedure Laterality Date  .  CHOLECYSTECTOMY  2015  . COSMETIC SURGERY  Age 28   dog bite to face    Family History  Problem Relation Age of Onset  . Alcohol abuse Mother   . Stroke Mother   . Bipolar disorder Mother   . Drug abuse Mother   . Alcohol abuse Father   . Bipolar disorder Father   . Drug abuse Father   . Colon cancer Maternal Grandfather   . Crohn's disease Paternal Grandmother   . Crohn's disease Paternal Aunt   . Crohn's disease Paternal Aunt   . Crohn's disease Paternal Uncle   . Colon cancer Paternal Uncle   . Cancer Maternal Aunt        "stomach cancer"  . Celiac disease Neg Hx     Social History   Tobacco Use  . Smoking status: Former Smoker    Packs/day: 0.50    Years: 5.00    Pack years: 2.50    Types: Cigarettes    Quit date: 11/24/2019    Years since quitting: 0.1  . Smokeless tobacco: Never Used  . Tobacco comment: one pack cigarettes daily  Vaping Use  . Vaping Use: Never used  Substance Use Topics  .  Alcohol use: Not Currently    Alcohol/week: 0.0 standard drinks    Comment: social  . Drug use: Yes    Types: Marijuana    Comment: uses once a day    Allergies: No Known Allergies  Medications Prior to Admission  Medication Sig Dispense Refill Last Dose  . valACYclovir (VALTREX) 1000 MG tablet Take 1 tablet (1,000 mg total) by mouth daily. 90 tablet 1 Past Month at Unknown time  . venlafaxine XR (EFFEXOR-XR) 37.5 MG 24 hr capsule Take 1 capsule (37.5 mg total) by mouth daily with breakfast. 7 capsule 0 Past Week at Unknown time  . fluconazole (DIFLUCAN) 200 MG tablet Take one dose by mouth, wait 72 hours, and then take second dose by mouth 2 tablet 0   . hydrOXYzine (VISTARIL) 25 MG capsule Take 1 capsule (25 mg total) by mouth daily as needed for anxiety. 30 capsule 1   . venlafaxine XR (EFFEXOR-XR) 75 MG 24 hr capsule 75 mg daily. Start after 37.5 mg daily for one week 30 capsule 0     Review of Systems  Gastrointestinal: Positive for abdominal pain.   Genitourinary: Positive for vaginal bleeding.  Musculoskeletal: Positive for back pain.  All other systems reviewed and are negative.  Physical Exam   Blood pressure 122/71, pulse 77, temperature 98.1 F (36.7 C), temperature source Oral, resp. rate 18, height 5\' 5"  (1.651 m), weight 106.5 kg, last menstrual period 11/14/2019, SpO2 100 %, unknown if currently breastfeeding.  Physical Exam Vitals and nursing note reviewed. Exam conducted with a chaperone present.  Cardiovascular:     Rate and Rhythm: Normal rate.     Pulses: Normal pulses.  Pulmonary:     Effort: Pulmonary effort is normal.     Breath sounds: Normal breath sounds.  Abdominal:     Tenderness: There is no abdominal tenderness. There is no right CVA tenderness or left CVA tenderness.  Genitourinary:    General: Normal vulva.     Comments: Pelvic exam: External genitalia normal, vaginal walls pink and well rugated, cervix visually closed, no lesions noted. Bimanual: cervix 0/thick/posterior, neg CMT, uterus nontender, no adnexal tenderness noted.  Skin:    General: Skin is warm.     Capillary Refill: Capillary refill takes less than 2 seconds.  Neurological:     Mental Status: She is alert.  Psychiatric:        Mood and Affect: Mood normal.     MAU Course/MDM  Procedures  --Discussed LMP of 12/14/2019 with use of Plan B 08/18. Possible symptoms associated with STI and use of Plan B, also consider pregnancy of unknown location. Discussed importance of repeat Quant hCG to differentiate. Patient verbalizes understanding  --Emphasized importance of 100% condom use including for anal sex due to highly contagious nature of STIs. Revisited risk for reinfection given multiple partners, unknown origin of infection. Suggested more viscous lubricant for anal sex given patient's report of rectal irritation. Consider lubricated condom.   Orders Placed This Encounter  Procedures  . Wet prep, genital  . US OB LESS THAN 14  WEEKS WITH OB TRANSVAGINAL  . Urinalysis, Routine w reflex microscopic Urine, Clean Catch  . CBC  . Comprehensive metabolic panel  . hCG, quantitative, pregnancy  . Pregnancy, urine POC   Patient Vitals for the past 24 hrs:  BP Temp Temp src Pulse Resp SpO2 Height Weight  01/24/20 0947 122/71 98.1 F (36.7 C) Oral 77 18 100 % 5\' 5"  (1.651 m) 106.5 kg  Results for orders placed or performed during the hospital encounter of 01/24/20 (from the past 24 hour(s))  Urinalysis, Routine w reflex microscopic     Status: Abnormal   Collection Time: 01/24/20  9:52 AM  Result Value Ref Range   Color, Urine YELLOW YELLOW   APPearance CLOUDY (A) CLEAR   Specific Gravity, Urine 1.026 1.005 - 1.030   pH 6.0 5.0 - 8.0   Glucose, UA 150 (A) NEGATIVE mg/dL   Hgb urine dipstick NEGATIVE NEGATIVE   Bilirubin Urine NEGATIVE NEGATIVE   Ketones, ur NEGATIVE NEGATIVE mg/dL   Protein, ur NEGATIVE NEGATIVE mg/dL   Nitrite NEGATIVE NEGATIVE   Leukocytes,Ua SMALL (A) NEGATIVE   RBC / HPF 0-5 0 - 5 RBC/hpf   WBC, UA 0-5 0 - 5 WBC/hpf   Bacteria, UA RARE (A) NONE SEEN   Squamous Epithelial / LPF 21-50 0 - 5   Mucus PRESENT   Pregnancy, urine POC     Status: Abnormal   Collection Time: 01/24/20  9:53 AM  Result Value Ref Range   Preg Test, Ur POSITIVE (A) NEGATIVE  Wet prep, genital     Status: Abnormal   Collection Time: 01/24/20 10:45 AM   Specimen: Vaginal  Result Value Ref Range   Yeast Wet Prep HPF POC NONE SEEN NONE SEEN   Trich, Wet Prep NONE SEEN NONE SEEN   Clue Cells Wet Prep HPF POC NONE SEEN NONE SEEN   WBC, Wet Prep HPF POC MANY (A) NONE SEEN   Sperm NONE SEEN   CBC     Status: None   Collection Time: 01/24/20 11:11 AM  Result Value Ref Range   WBC 6.5 4.0 - 10.5 K/uL   RBC 4.28 3.87 - 5.11 MIL/uL   Hemoglobin 13.1 12.0 - 15.0 g/dL   HCT 39.4 36 - 46 %   MCV 92.1 80.0 - 100.0 fL   MCH 30.6 26.0 - 34.0 pg   MCHC 33.2 30.0 - 36.0 g/dL   RDW 13.3 11.5 - 15.5 %   Platelets 247 150 -  400 K/uL   nRBC 0.0 0.0 - 0.2 %  Comprehensive metabolic panel     Status: Abnormal   Collection Time: 01/24/20 11:11 AM  Result Value Ref Range   Sodium 140 135 - 145 mmol/L   Potassium 4.3 3.5 - 5.1 mmol/L   Chloride 105 98 - 111 mmol/L   CO2 25 22 - 32 mmol/L   Glucose, Bld 112 (H) 70 - 99 mg/dL   BUN 10 6 - 20 mg/dL   Creatinine, Ser 0.82 0.44 - 1.00 mg/dL   Calcium 9.0 8.9 - 10.3 mg/dL   Total Protein 6.8 6.5 - 8.1 g/dL   Albumin 3.7 3.5 - 5.0 g/dL   AST 42 (H) 15 - 41 U/L   ALT 19 0 - 44 U/L   Alkaline Phosphatase 70 38 - 126 U/L   Total Bilirubin 1.3 (H) 0.3 - 1.2 mg/dL   GFR calc non Af Amer >60 >60 mL/min   GFR calc Af Amer >60 >60 mL/min   Anion gap 10 5 - 15  hCG, quantitative, pregnancy     Status: Abnormal   Collection Time: 01/24/20 11:11 AM  Result Value Ref Range   hCG, Beta Chain, Quant, S 170 (H) <5 mIU/mL   US OB LESS THAN 14 WEEKS WITH OB TRANSVAGINAL  Result Date: 01/24/2020 CLINICAL DATA:  Pain, [redacted] weeks gestational age by LMP EXAM: OBSTETRIC <14 WK ULTRASOUND TECHNIQUE: Transabdominal ultrasound  was performed for evaluation of the gestation as well as the maternal uterus and adnexal regions. COMPARISON:  None. FINDINGS: Intrauterine gestational sac: None Yolk sac:  Not Visualized. Embryo:  Not Visualized. Maternal uterus/adnexae: There is small volume fluid within the endometrial canal toward the fundus. The right ovary is not visualized. The left ovary is only seen transabdominally and is unremarkable. There is no free fluid. IMPRESSION: No intrauterine pregnancy. Small volume fluid within the endometrial canal towards the fundus potentially related to failed pregnancy. No free fluid in the pelvis. Electronically Signed   By: Macy Mis M.D.   On: 01/24/2020 11:37   Assessment and Plan  --24 y.o. [id number] at with pregnancy of unknown location --S/p Plan B 01/12/2020 --Quant hCG 170 --Blood type O POS --GC/C in work --Discharge home in stable  condition  F/U: --Stat Quant hCG [facility] 01/26/2020  Darlina Rumpf, CNM 01/24/2020, 2:42 PM

## 2020-01-24 NOTE — Discharge Instructions (Signed)
Human Chorionic Gonadotropin Test Why am I having this test? A human chorionic gonadotropin (hCG) test is done to determine whether you are pregnant. It can also be used:  To diagnose an abnormal pregnancy.  To determine whether you have had a failed pregnancy (miscarriage) or are at risk of one. What is being tested? This test checks the level of the human chorionic gonadotropin (hCG) hormone in the blood. This hormone is produced during pregnancy by the cells that form the placenta. The placenta is the organ that grows inside your womb (uterus) to nourish a developing baby. When you are pregnant, hCG can be detected in your blood or urine 7 to 8 days before your missed period. It continues to go up for the first 8-10 weeks of pregnancy. The presence of hCG in your blood can be measured with several different types of tests. You may have:  A urine test. ? Because this hormone is eliminated from your body by your kidneys, you may have a urine test to find out whether you are pregnant. A home pregnancy test detects whether there is hCG in your urine. ? A urine test only shows whether there is hCG in your urine. It does not measure how much.  A qualitative blood test. ? You may have this type of blood test to find out if you are pregnant. ? This blood test only shows whether there is hCG in your blood. It does not measure how much.  A quantitative blood test. ? This type of blood test measures the amount of hCG in your blood. ? You may have this test to:  Diagnose an abnormal pregnancy.  Check whether you have had a miscarriage.  Determine whether you are at risk of a miscarriage. What kind of sample is taken?     Two kinds of samples may be collected to test for the hCG hormone.  Blood. It is usually collected by inserting a needle into a blood vessel.  Urine. It is usually collected by urinating into a germ-free (sterile) specimen cup. It is best to collect the sample the first  time you urinate in the morning. How do I prepare for this test? No preparation is needed for a blood test.  For the urine test:  Let your health care provider know about: ? All medicines you are taking, including vitamins, herbs, creams, and over-the-counter medicines. ? Any blood in your urine. This may interfere with the result.  Do not drink too much fluid. Drink as you normally would, or as directed by your health care provider. How are the results reported? Depending on the type of test that you have, your test results may be reported as values. Your health care provider will compare your results to normal ranges that were established after testing a large group of people (reference ranges). Reference ranges may vary among labs and hospitals. For this test, common reference ranges that show absence of pregnancy are:  Quantitative hCG blood levels: less than 5 IU/L. Other results will be reported as either positive or negative. For this test, normal results (meaning the absence of pregnancy) are:  Negative for hCG in the urine test.  Negative for hCG in the qualitative blood test. What do the results mean? Urine and qualitative blood test  A negative result could mean: ? That you are not pregnant. ? That the test was done too early in your pregnancy to detect hCG in your blood or urine. If you still have other signs   of pregnancy, the test will be repeated.  A positive result means: ? That you are most likely pregnant. Your health care provider may confirm your pregnancy with an imaging study (ultrasound) of your uterus, if needed. Quantitative blood test Results of the quantitative hCG blood test will be interpreted as follows:  Less than 5 IU/L: You are most likely not pregnant.  Greater than 25 IU/L: You are most likely pregnant.  hCG levels that are higher than expected: ? You are pregnant with twins. ? You have abnormal growths in the uterus.  hCG levels that are  rising more slowly than expected: ? You have an ectopic pregnancy (also called a tubal pregnancy).  hCG levels that are falling: ? You may be having a miscarriage. Talk with your health care provider about what your results mean. Questions to ask your health care provider Ask your health care provider, or the department that is doing the test:  When will my results be ready?  How will I get my results?  What are my treatment options?  What other tests do I need?  What are my next steps? Summary  A human chorionic gonadotropin test is done to determine whether you are pregnant.  When you are pregnant, hCG can be detected in your blood or urine 7 to 8 days before your missed period. It continues to go up for the first 8-10 weeks of pregnancy.  Your hCG level can be measured with different types of tests. You may have a urine test, a qualitative blood test, or a quantitative blood test.  Talk with your health care provider about what your results mean. This information is not intended to replace advice given to you by your health care provider. Make sure you discuss any questions you have with your health care provider. Document Revised: 04/14/2017 Document Reviewed: 04/14/2017 Elsevier Patient Education  2020 [employer]  

## 2020-01-24 NOTE — MAU Note (Addendum)
Pt asking if pee can be tested for stds. (neg preg 8/7, + for CHL then was treated)

## 2020-01-25 ENCOUNTER — Encounter: Payer: Self-pay | Admitting: Advanced Practice Midwife

## 2020-01-25 ENCOUNTER — Other Ambulatory Visit: Payer: Self-pay | Admitting: Advanced Practice Midwife

## 2020-01-25 DIAGNOSIS — A749 Chlamydial infection, unspecified: Secondary | ICD-10-CM | POA: Insufficient documentation

## 2020-01-25 LAB — GC/CHLAMYDIA PROBE AMP (~~LOC~~) NOT AT ARMC
Chlamydia: POSITIVE — AB
Comment: NEGATIVE
Comment: NORMAL
Neisseria Gonorrhea: NEGATIVE

## 2020-01-25 MED ORDER — AZITHROMYCIN 250 MG PO TABS: 1000.0000 mg | ORAL_TABLET | Freq: Once | ORAL | 0 refills | Status: AC

## 2020-01-25 NOTE — Progress Notes (Signed)
+   Chlamydia. Patient notified via phone call. Patient instruction, recommendation for partner treatment, timing for abstinence and condom use reviewed.  Mallie Snooks, MSN, CNM Certified Nurse Midwife, [employer] for [employer], [facility] 01/25/20 1:25 PM

## 2020-01-26 ENCOUNTER — Other Ambulatory Visit: Payer: Medicaid Other

## 2020-01-26 DIAGNOSIS — O3680X0 Pregnancy with inconclusive fetal viability, not applicable or unspecified: Secondary | ICD-10-CM

## 2020-01-27 ENCOUNTER — Other Ambulatory Visit: Payer: Self-pay | Admitting: Adult Health

## 2020-01-27 DIAGNOSIS — O3680X0 Pregnancy with inconclusive fetal viability, not applicable or unspecified: Secondary | ICD-10-CM

## 2020-01-27 LAB — BETA HCG QUANT (REF LAB): hCG Quant: 299 m[IU]/mL

## 2020-01-28 DIAGNOSIS — O3680X0 Pregnancy with inconclusive fetal viability, not applicable or unspecified: Secondary | ICD-10-CM | POA: Diagnosis not present

## 2020-01-29 LAB — BETA HCG QUANT (REF LAB): hCG Quant: 746 m[IU]/mL

## 2020-02-02 DIAGNOSIS — A64 Unspecified sexually transmitted disease: Secondary | ICD-10-CM | POA: Diagnosis not present

## 2020-02-04 ENCOUNTER — Inpatient Hospital Stay (HOSPITAL_COMMUNITY)
Admission: AD | Admit: 2020-02-04 | Discharge: 2020-02-04 | Disposition: A | Discharge status: Home / Self Care | Payer: Medicaid Other | Attending: Obstetrics & Gynecology | Admitting: Obstetrics & Gynecology

## 2020-02-04 ENCOUNTER — Ambulatory Visit: Payer: Medicaid Other | Admitting: Family Medicine

## 2020-02-04 ENCOUNTER — Inpatient Hospital Stay (HOSPITAL_COMMUNITY): Payer: Medicaid Other

## 2020-02-04 ENCOUNTER — Other Ambulatory Visit: Payer: Self-pay

## 2020-02-04 DIAGNOSIS — O99341 Other mental disorders complicating pregnancy, first trimester: Secondary | ICD-10-CM | POA: Diagnosis not present

## 2020-02-04 DIAGNOSIS — E669 Obesity, unspecified: Secondary | ICD-10-CM | POA: Diagnosis not present

## 2020-02-04 DIAGNOSIS — Z87891 Personal history of nicotine dependence: Secondary | ICD-10-CM | POA: Insufficient documentation

## 2020-02-04 DIAGNOSIS — A749 Chlamydial infection, unspecified: Secondary | ICD-10-CM | POA: Diagnosis not present

## 2020-02-04 DIAGNOSIS — O99891 Other specified diseases and conditions complicating pregnancy: Secondary | ICD-10-CM

## 2020-02-04 DIAGNOSIS — Z3A10 10 weeks gestation of pregnancy: Secondary | ICD-10-CM | POA: Insufficient documentation

## 2020-02-04 DIAGNOSIS — K219 Gastro-esophageal reflux disease without esophagitis: Secondary | ICD-10-CM | POA: Insufficient documentation

## 2020-02-04 DIAGNOSIS — R109 Unspecified abdominal pain: Secondary | ICD-10-CM

## 2020-02-04 DIAGNOSIS — Z8619 Personal history of other infectious and parasitic diseases: Secondary | ICD-10-CM

## 2020-02-04 DIAGNOSIS — O98211 Gonorrhea complicating pregnancy, first trimester: Secondary | ICD-10-CM

## 2020-02-04 DIAGNOSIS — Z79899 Other long term (current) drug therapy: Secondary | ICD-10-CM | POA: Diagnosis not present

## 2020-02-04 DIAGNOSIS — Z3491 Encounter for supervision of normal pregnancy, unspecified, first trimester: Secondary | ICD-10-CM

## 2020-02-04 DIAGNOSIS — O98811 Other maternal infectious and parasitic diseases complicating pregnancy, first trimester: Secondary | ICD-10-CM | POA: Insufficient documentation

## 2020-02-04 DIAGNOSIS — R102 Pelvic and perineal pain: Secondary | ICD-10-CM | POA: Insufficient documentation

## 2020-02-04 DIAGNOSIS — F419 Anxiety disorder, unspecified: Secondary | ICD-10-CM | POA: Insufficient documentation

## 2020-02-04 DIAGNOSIS — A549 Gonococcal infection, unspecified: Secondary | ICD-10-CM | POA: Diagnosis not present

## 2020-02-04 DIAGNOSIS — Z3A01 Less than 8 weeks gestation of pregnancy: Secondary | ICD-10-CM | POA: Diagnosis not present

## 2020-02-04 DIAGNOSIS — O26891 Other specified pregnancy related conditions, first trimester: Secondary | ICD-10-CM | POA: Diagnosis present

## 2020-02-04 DIAGNOSIS — O99212 Obesity complicating pregnancy, second trimester: Secondary | ICD-10-CM | POA: Insufficient documentation

## 2020-02-04 DIAGNOSIS — F329 Major depressive disorder, single episode, unspecified: Secondary | ICD-10-CM | POA: Diagnosis not present

## 2020-02-04 DIAGNOSIS — O208 Other hemorrhage in early pregnancy: Secondary | ICD-10-CM | POA: Diagnosis not present

## 2020-02-04 DIAGNOSIS — O99611 Diseases of the digestive system complicating pregnancy, first trimester: Secondary | ICD-10-CM | POA: Diagnosis not present

## 2020-02-04 LAB — URINALYSIS, ROUTINE W REFLEX MICROSCOPIC
Bilirubin Urine: NEGATIVE
Glucose, UA: NEGATIVE mg/dL
Hgb urine dipstick: NEGATIVE
Ketones, ur: NEGATIVE mg/dL
Nitrite: NEGATIVE
Protein, ur: NEGATIVE mg/dL
Specific Gravity, Urine: 1.019 (ref 1.005–1.030)
pH: 6 (ref 5.0–8.0)

## 2020-02-04 MED ORDER — AZITHROMYCIN 250 MG PO TABS: 1000.0000 mg | ORAL_TABLET | Freq: Once | ORAL | Status: DC

## 2020-02-04 MED ORDER — CEFTRIAXONE SODIUM 500 MG IJ SOLR
500.0000 mg | Freq: Once | INTRAMUSCULAR | Status: AC
  Administered 2020-02-04: 500 mg via INTRAMUSCULAR
  Filled 2020-02-04: qty 500

## 2020-02-04 MED ORDER — AZITHROMYCIN 250 MG PO TABS
1000.0000 mg | ORAL_TABLET | Freq: Once | ORAL | Status: AC
  Administered 2020-02-04: 1000 mg via ORAL
  Filled 2020-02-04: qty 4

## 2020-02-04 NOTE — MAU Note (Signed)
Pt stated her OBGYN([facility]) Told her that her U/S did not show a pregnancy and she wanted her to be seen here to make sure she does not have an ectopic pregnancy. C/o pain RLQ x3-4 days. Also ws to she tested positive gohnoreah and here for treatment.

## 2020-02-04 NOTE — MAU Provider Note (Signed)
Chief Complaint: Abdominal Pain   First Provider Initiated Contact with Patient 02/04/20 1506      SUBJECTIVE HPI: Colleen Valentine is a 24 y.o. G5P1031 at [redacted]w[redacted]d by early ultrasound who presents to maternity admissions reporting intermittent abdominal pain.  She had rise in hcg from 170 on 8/30/2 to 299 on 01/26/20 and to 746 on 01/28/20.  She was treated for chlamydia on 01/28/20 but has had sex with her partner since treatment.  She was also positive for gonorrhea in [place] since the 9/3 visit.   She denies vaginal bleeding.   Location: low abdominal pain Quality: cramping Severity: 5/10 on pain scale Duration: 1-2 weeks Timing: intermittent Modifying factors: none Associated signs and symptoms: n/a  HPI  Past Medical History:  Diagnosis Date  . Anemia   . Anxiety   . Complication of anesthesia   . Depression   . GERD (gastroesophageal reflux disease)   . Herpes genitalia   . Insomnia   . Obesity   . PONV (postoperative nausea and vomiting)   . Seizures ([place])    once in 2016 due to alcohol poisioning  . Suicide [facility])    Past Surgical History:  Procedure Laterality Date  . CHOLECYSTECTOMY  2015  . COSMETIC SURGERY  Age 63   dog bite to face   Social History   Socioeconomic History  . Marital status: Single    Spouse name: Not on file  . Number of children: Not on file  . Years of education: Not on file  . Highest education level: Not on file  Occupational History  . 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 Smoking status: Former Smoker    Packs/day: 0.50    Years: 5.00    Pack years: 2.50    Types: Cigarettes    Quit date: 11/24/2019    Years since quitting: 0.1  . Smokeless tobacco: Never Used  . Tobacco comment: one pack cigarettes daily  Vaping Use  . Vaping Use: Never used  Substance and Sexual Activity  . Alcohol use: Not Currently    Alcohol/week: 0.0 standard drinks    Comment: social  . Drug use: Yes    Types:  Marijuana    Comment: uses once a day  . Sexual activity: Not Currently    Partners: Male    Birth control/protection: Other-see comments  Other Topics Concern  . Not on file  Social History Narrative   ** Merged History Encounter **       Social Determinants of Health   Financial Resource Strain:   . Difficulty of Paying Living Expenses: Not on file  Food Insecurity:   . Worried About Charity fundraiser in the Last Year: Not on file  . Ran Out of Food in the Last Year: Not on file  Transportation Needs:   . Lack of Transportation (Medical): Not on file  . Lack of Transportation (Non-Medical): Not on file  Physical Activity:   . Days of Exercise per Week: Not on file  . Minutes of Exercise per Session: Not on file  Stress:   . Feeling of Stress : Not on file  Social Connections:   . Frequency of Communication with Friends and Family: Not on file  . Frequency of Social Gatherings with Friends and Family: Not on file  . Attends Religious Services: Not on file  . Active Member of Clubs or Organizations: Not on file  . Attends Club or  Organization Meetings: Not on file  . Marital Status: Not on file  Intimate Partner Violence:   . Fear of Current or Ex-Partner: Not on file  . Emotionally Abused: Not on file  . Physically Abused: Not on file  . Sexually Abused: Not on file   No current facility-administered medications on file prior to encounter.   Current Outpatient Medications on File Prior to Encounter  Medication Sig Dispense Refill  . hydrOXYzine (VISTARIL) 25 MG capsule Take 1 capsule (25 mg total) by mouth daily as needed for anxiety. 30 capsule 1  . valACYclovir (VALTREX) 1000 MG tablet Take 1 tablet (1,000 mg total) by mouth daily. 90 tablet 1  . venlafaxine XR (EFFEXOR-XR) 75 MG 24 hr capsule 75 mg daily. Start after 37.5 mg daily for one week 30 capsule 0  . [DISCONTINUED] albuterol (VENTOLIN HFA) 108 (90 Base) MCG/ACT inhaler Inhale 1-2 puffs into the lungs every  6 (six) hours as needed for wheezing or shortness of breath. (Patient not taking: Reported on 11/18/2019) 18 g 0  . [DISCONTINUED] pantoprazole (PROTONIX) 40 MG tablet Take 1 tablet (40 mg total) by mouth daily before breakfast. 30 tablet 5   No Known Allergies  ROS:  Review of Systems  Constitutional: Negative for chills, fatigue and fever.  Respiratory: Negative for shortness of breath.   Cardiovascular: Negative for chest pain.  Gastrointestinal: Positive for abdominal pain.  Genitourinary: Positive for pelvic pain. Negative for difficulty urinating, dysuria, flank pain, vaginal bleeding, vaginal discharge and vaginal pain.  Neurological: Negative for dizziness and headaches.  Psychiatric/Behavioral: Negative.      I have reviewed patient's Past Medical Hx, Surgical Hx, Family Hx, Social Hx, medications and allergies.   Physical Exam   Patient Vitals for the past 24 hrs:  BP Temp Pulse Resp Height Weight  02/04/20 1335 124/73 98 F (36.7 C) 83 18 5\' 5"  (1.651 m) 106.6 kg   Constitutional: Well-developed, well-nourished female in no acute distress.  Cardiovascular: normal rate Respiratory: normal effort GI: Abd soft, non-tender. Pos BS x 4 MS: Extremities nontender, no edema, normal ROM Neurologic: Alert and oriented x 4.  GU: Neg CVAT.  PELVIC EXAM: Deferred   LAB RESULTS Results for orders placed or performed during the hospital encounter of 02/04/20 (from the past 24 hour(s))  Urinalysis, Routine w reflex microscopic Urine, Clean Catch     Status: Abnormal   Collection Time: 02/04/20  1:38 PM  Result Value Ref Range   Color, Urine YELLOW YELLOW   APPearance HAZY (A) CLEAR   Specific Gravity, Urine 1.019 1.005 - 1.030   pH 6.0 5.0 - 8.0   Glucose, UA NEGATIVE NEGATIVE mg/dL   Hgb urine dipstick NEGATIVE NEGATIVE   Bilirubin Urine NEGATIVE NEGATIVE   Ketones, ur NEGATIVE NEGATIVE mg/dL   Protein, ur NEGATIVE NEGATIVE mg/dL   Nitrite NEGATIVE NEGATIVE    Leukocytes,Ua MODERATE (A) NEGATIVE   RBC / HPF 0-5 0 - 5 RBC/hpf   WBC, UA 0-5 0 - 5 WBC/hpf   Bacteria, UA FEW (A) NONE SEEN   Squamous Epithelial / LPF 11-20 0 - 5   Mucus PRESENT        IMAGING US OB Transvaginal  Result Date: 02/04/2020 CLINICAL DATA:  Abdominal pain pregnancy.  Positive beta HCG EXAM: TRANSVAGINAL OB ULTRASOUND TECHNIQUE: Transvaginal ultrasound was performed for complete evaluation of the gestation as well as the maternal uterus, adnexal regions, and pelvic cul-de-sac. COMPARISON:  01/24/2020 FINDINGS: Intrauterine gestational sac: Single Yolk sac:  Visualized.  Embryo:  Not Visualized. Cardiac Activity: Not Visualized. MSD: 10 mm   5 w   5 d Subchorionic hemorrhage:  Moderate-sized subchorionic hemorrhage. Maternal uterus/adnexae: Normal appearance of the bilateral ovaries. No evidence of adnexal mass. No free fluid within the pelvis. IMPRESSION: 1. Intrauterine gestational sac with yolk sac, but no fetal pole or cardiac activity yet visualized. Recommend follow-up quantitative B-HCG levels and follow-up US in 14 days to assess viability. This recommendation follows SRU consensus guidelines: Diagnostic Criteria for Nonviable Pregnancy Early in the First Trimester. [place] [employer] Med 2013; 010:9323-55. 2. Moderate-sized subchorionic hemorrhage. Electronically Signed   By: Davina Poke D.O.   On: 02/04/2020 14:30   US OB LESS THAN 14 WEEKS WITH OB TRANSVAGINAL  Result Date: 01/24/2020 CLINICAL DATA:  Pain, [redacted] weeks gestational age by LMP EXAM: OBSTETRIC <14 WK ULTRASOUND TECHNIQUE: Transabdominal ultrasound was performed for evaluation of the gestation as well as the maternal uterus and adnexal regions. COMPARISON:  None. FINDINGS: Intrauterine gestational sac: None Yolk sac:  Not Visualized. Embryo:  Not Visualized. Maternal uterus/adnexae: There is small volume fluid within the endometrial canal toward the fundus. The right ovary is not visualized. The left ovary is only seen  transabdominally and is unremarkable. There is no free fluid. IMPRESSION: No intrauterine pregnancy. Small volume fluid within the endometrial canal towards the fundus potentially related to failed pregnancy. No free fluid in the pelvis. Electronically Signed   By: Macy Mis M.D.   On: 01/24/2020 11:37    MAU Management/MDM: Orders Placed This Encounter  Procedures  . US OB Transvaginal  . Urinalysis, Routine w reflex microscopic Urine, Clean Catch  . Discharge patient    Meds ordered this encounter  Medications  . DISCONTD: azithromycin (ZITHROMAX) tablet 1,000 mg  . azithromycin (ZITHROMAX) tablet 1,000 mg  . cefTRIAXone (ROCEPHIN) injection 500 mg    Order Specific Question:   Antibiotic Indication:    Answer:   STD    IUP on today's [place] with moderate [place], reviewed results with pt.  Pt took azithromycin for chlamydia + in MAU but had sex with partner again.  She also tested positive for gonorrhea at her visit in [place] and has not received treatment.  Azithromycin and Rocephin given in MAU. Pt to f/u with early prenatal care. Precautions/reasons to return to MAU given.      ASSESSMENT 1. Normal IUP (intrauterine pregnancy) on prenatal ultrasound, first trimester   2. Chlamydia infection affecting pregnancy in first trimester   3. Gonorrhea affecting pregnancy in first trimester     PLAN Discharge home Allergies as of 02/04/2020   No Known Allergies     Medication List    TAKE these medications   hydrOXYzine 25 MG capsule Commonly known as: VISTARIL Take 1 capsule (25 mg total) by mouth daily as needed for anxiety.   valACYclovir 1000 MG tablet Commonly known as: Valtrex Take 1 tablet (1,000 mg total) by mouth daily.   venlafaxine XR 75 MG 24 hr capsule Commonly known as: EFFEXOR-XR 75 mg daily. Start after 37.5 mg daily for one week       Follow-up Information    Prenatal provider of your choice Follow up.   Why: See list provided              Fatima Blank Certified Nurse-Midwife 02/04/2020  3:15 PM

## 2020-02-04 NOTE — Discharge Instructions (Signed)
Center for [place] for [place] locations:  Hours may vary. Please call for an appointment  Center for [place] @ [place] [place] [facility]  [address] [place] [phone]  Center for [facility] @ [place], [place] [phone]  Center for [facility] @ [place], [place]  [phone]  [place] @ [facility]       [address], [place], [place] [phone]            Center for [facility]     [address], [place], [place] [phone]          Center for [place] @ [place] [facility] [place], [place] [phone]     Center for [facility] @ [place])  [place] , [place], [place]  [phone]

## 2020-02-07 ENCOUNTER — Encounter: Payer: Self-pay | Admitting: Family

## 2020-02-08 DIAGNOSIS — O3680X0 Pregnancy with inconclusive fetal viability, not applicable or unspecified: Secondary | ICD-10-CM | POA: Diagnosis not present

## 2020-02-08 DIAGNOSIS — F3132 Bipolar disorder, current episode depressed, moderate: Secondary | ICD-10-CM | POA: Diagnosis not present

## 2020-02-08 DIAGNOSIS — F418 Other specified anxiety disorders: Secondary | ICD-10-CM | POA: Diagnosis not present

## 2020-02-15 DIAGNOSIS — Z3A01 Less than 8 weeks gestation of pregnancy: Secondary | ICD-10-CM | POA: Diagnosis not present

## 2020-02-15 DIAGNOSIS — O200 Threatened abortion: Secondary | ICD-10-CM | POA: Diagnosis not present

## 2020-02-15 DIAGNOSIS — N760 Acute vaginitis: Secondary | ICD-10-CM | POA: Diagnosis not present

## 2020-02-16 DIAGNOSIS — F418 Other specified anxiety disorders: Secondary | ICD-10-CM | POA: Diagnosis not present

## 2020-02-16 DIAGNOSIS — N912 Amenorrhea, unspecified: Secondary | ICD-10-CM | POA: Diagnosis not present

## 2020-02-16 DIAGNOSIS — Z3A01 Less than 8 weeks gestation of pregnancy: Secondary | ICD-10-CM | POA: Diagnosis not present

## 2020-02-16 DIAGNOSIS — Z3201 Encounter for pregnancy test, result positive: Secondary | ICD-10-CM | POA: Diagnosis not present

## 2020-02-16 DIAGNOSIS — O3680X0 Pregnancy with inconclusive fetal viability, not applicable or unspecified: Secondary | ICD-10-CM | POA: Diagnosis not present

## 2020-02-16 DIAGNOSIS — F3132 Bipolar disorder, current episode depressed, moderate: Secondary | ICD-10-CM | POA: Diagnosis not present

## 2020-02-22 DIAGNOSIS — Z202 Contact with and (suspected) exposure to infections with a predominantly sexual mode of transmission: Secondary | ICD-10-CM | POA: Diagnosis not present
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
## 2020-02-28 ENCOUNTER — Telehealth (HOSPITAL_COMMUNITY): Payer: Self-pay | Admitting: Psychiatry

## 2020-02-28 NOTE — Telephone Encounter (Signed)
Called pt to advise appt moved to 3pm for 34m visit with Dr. Modesta Messing, unable to leave vm
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
## 2020-02-29 ENCOUNTER — Telehealth (HOSPITAL_COMMUNITY): Payer: Self-pay | Admitting: Psychiatry

## 2020-02-29 ENCOUNTER — Telehealth (HOSPITAL_COMMUNITY): Payer: Medicaid Other | Admitting: Psychiatry

## 2020-02-29 ENCOUNTER — Other Ambulatory Visit: Payer: Self-pay

## 2020-02-29 NOTE — Telephone Encounter (Signed)
Sent link for video visit through [place]. Patient did not sign in. Called the patient for appointment scheduled today. No option to leave a voice message.

## 2020-03-16 DIAGNOSIS — Z3682 Encounter for antenatal screening for nuchal translucency: Secondary | ICD-10-CM | POA: Diagnosis not present

## 2020-03-16 LAB — OB RESULTS CONSOLE RPR: RPR: NONREACTIVE

## 2020-03-16 LAB — OB RESULTS CONSOLE HEPATITIS B SURFACE ANTIGEN: Hepatitis B Surface Ag: NEGATIVE

## 2020-03-16 LAB — OB RESULTS CONSOLE RUBELLA ANTIBODY, IGM: Rubella: IMMUNE

## 2020-03-16 LAB — OB RESULTS CONSOLE HIV ANTIBODY (ROUTINE TESTING): HIV: NONREACTIVE

## 2020-03-28 DIAGNOSIS — F1290 Cannabis use, unspecified, uncomplicated: Secondary | ICD-10-CM | POA: Insufficient documentation

## 2020-03-29 ENCOUNTER — Ambulatory Visit
Admission: EM | Admit: 2020-03-29 | Discharge: 2020-03-29 | Disposition: A | Discharge status: Home / Self Care | Payer: Medicaid Other | Attending: Emergency Medicine | Admitting: Emergency Medicine

## 2020-03-29 ENCOUNTER — Encounter: Payer: Self-pay | Admitting: Emergency Medicine

## 2020-03-29 ENCOUNTER — Other Ambulatory Visit: Payer: Self-pay

## 2020-03-29 DIAGNOSIS — J029 Acute pharyngitis, unspecified: Secondary | ICD-10-CM

## 2020-03-29 DIAGNOSIS — R509 Fever, unspecified: Secondary | ICD-10-CM | POA: Diagnosis not present

## 2020-03-29 LAB — POCT RAPID STREP A (OFFICE): Rapid Strep A Screen: NEGATIVE

## 2020-03-29 MED ORDER — FLUTICASONE PROPIONATE 50 MCG/ACT NA SUSP: 2.0000 | Freq: Every day | NASAL | 0 refills | Status: DC

## 2020-03-29 MED ORDER — ACETAMINOPHEN 325 MG PO TABS
975.0000 mg | ORAL_TABLET | Freq: Once | ORAL | Status: AC
  Administered 2020-03-29: 975 mg via ORAL

## 2020-03-29 MED ORDER — CETIRIZINE HCL 10 MG PO TABS: 10.0000 mg | ORAL_TABLET | Freq: Every day | ORAL | 0 refills | Status: DC

## 2020-03-29 NOTE — ED Triage Notes (Addendum)
Sore throat and swollen tonsils for past couple of days.  States her temp was 104 this morning.  Last took tylenol around 1000 today.  Pt is 12 weeks preg.

## 2020-03-29 NOTE — ED Provider Notes (Signed)
[place]   [id number] 03/29/20 Arrival Time: 5631   CC: COVID symptoms  SUBJECTIVE: History from: patient.  Colleen Valentine is a 24 y.o. female who presents with headache, body aches, sore throat, and swollen tonsils x 2 days.  Denies sick exposure to COVID, flu or strep.  Has tried OTC tylenol without relief.  Symptoms are made worse with swallowing, but tolerating own secretions.  Reports previous symptoms in the past.   Fever of tmax of 104 this morning.  Denies chills, sinus pain, rhinorrhea, SOB, wheezing, chest pain, nausea, changes in bowel or bladder habits.    ROS: As per HPI.  All other pertinent ROS negative.     Past Medical History:  Diagnosis Date  . Anemia   . Anxiety   . Complication of anesthesia   . Depression   . GERD (gastroesophageal reflux disease)   . Herpes genitalia   . Insomnia   . Obesity   . PONV (postoperative nausea and vomiting)   . Seizures ([place])    once in 2016 due to alcohol poisioning  . Suicide [facility])    Past Surgical History:  Procedure Laterality Date  . CHOLECYSTECTOMY  2015  . COSMETIC SURGERY  Age 44   dog bite to face   No Known Allergies No current facility-administered medications on file prior to encounter.   Current Outpatient Medications on File Prior to Encounter  Medication Sig Dispense Refill  . hydrOXYzine (VISTARIL) 25 MG capsule Take 1 capsule (25 mg total) by mouth daily as needed for anxiety. 30 capsule 1  . valACYclovir (VALTREX) 1000 MG tablet Take 1 tablet (1,000 mg total) by mouth daily. 90 tablet 1  . venlafaxine XR (EFFEXOR-XR) 75 MG 24 hr capsule 75 mg daily. Start after 37.5 mg daily for one week 30 capsule 0  . [DISCONTINUED] albuterol (VENTOLIN HFA) 108 (90 Base) MCG/ACT inhaler Inhale 1-2 puffs into the lungs every 6 (six) hours as needed for wheezing or shortness of breath. (Patient not taking: Reported on 11/18/2019) 18 g 0  . [DISCONTINUED] pantoprazole (PROTONIX) 40 MG tablet Take 1 tablet (40  mg total) by mouth daily before breakfast. 30 tablet 5   Social History   Socioeconomic History  . Marital status: Single    Spouse name: Not on file  . Number of children: Not on file  . Years of education: Not on file  . Highest education level: Not on file  Occupational History  . 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 Smoking status: Former Smoker    Packs/day: 0.50    Years: 5.00    Pack years: 2.50    Types: Cigarettes    Quit date: 11/24/2019    Years since quitting: 0.3  . Smokeless tobacco: Never Used  . Tobacco comment: one pack cigarettes daily  Vaping Use  . Vaping Use: Never used  Substance and Sexual Activity  . Alcohol use: Not Currently    Alcohol/week: 0.0 standard drinks    Comment: social  . Drug use: Yes    Types: Marijuana    Comment: uses once a day  . Sexual activity: Not Currently    Partners: Male    Birth control/protection: Other-see comments  Other Topics Concern  . Not on file  Social History Narrative   ** Merged History Encounter **       Social Determinants of Health   Financial Resource Strain:   . Difficulty of Paying  Living Expenses: Not on file  Food Insecurity:   . Worried About Charity fundraiser in the Last Year: Not on file  . Ran Out of Food in the Last Year: Not on file  Transportation Needs:   . Lack of Transportation (Medical): Not on file  . Lack of Transportation (Non-Medical): Not on file  Physical Activity:   . Days of Exercise per Week: Not on file  . Minutes of Exercise per Session: Not on file  Stress:   . Feeling of Stress : Not on file  Social Connections:   . Frequency of Communication with Friends and Family: Not on file  . Frequency of Social Gatherings with Friends and Family: Not on file  . Attends Religious Services: Not on file  . Active Member of Clubs or Organizations: Not on file  . Attends Archivist Meetings: Not on file  . Marital Status:  Not on file  Intimate Partner Violence:   . Fear of Current or Ex-Partner: Not on file  . Emotionally Abused: Not on file  . Physically Abused: Not on file  . Sexually Abused: Not on file   Family History  Problem Relation Age of Onset  . Alcohol abuse Mother   . Stroke Mother   . Bipolar disorder Mother   . Drug abuse Mother   . Alcohol abuse Father   . Bipolar disorder Father   . Drug abuse Father   . Colon cancer Maternal Grandfather   . Crohn's disease Paternal Grandmother   . Crohn's disease Paternal Aunt   . Crohn's disease Paternal Aunt   . Crohn's disease Paternal Uncle   . Colon cancer Paternal Uncle   . Cancer Maternal Aunt        "stomach cancer"  . Celiac disease Neg Hx     OBJECTIVE:  Vitals:   03/29/20 1736 03/29/20 1737  BP: 107/70   Pulse: (!) 119   Resp: 18   Temp: 98.3 F (36.8 C)   TempSrc: Oral   SpO2: 98%   Weight:  238 lb (108 kg)  Height:  5\' 5"  (1.651 m)    General appearance: alert; appears fatigued, but nontoxic; speaking in full sentences and tolerating own secretions;  HEENT: NCAT; Ears: EACs clear, TMs pearly gray; Eyes: PERRL.  EOM grossly intact. Nose: nares patent without rhinorrhea, Throat: oropharynx clear, tonsils without obvious erythematous or enlargement, patient cries out in pain during examination, unable to visualize throat after that Neck: supple; touches chin to chest without difficulty Lungs: unlabored respirations, symmetrical air entry; cough: absent; no respiratory distress; CTAB Heart: regular rate and rhythm.   Skin: warm and dry Psychological: alert and cooperative; tearful mood and affect  LABS:  Results for orders placed or performed during the hospital encounter of 03/29/20 (from the past 24 hour(s))  POCT rapid strep A     Status: None   Collection Time: 03/29/20  5:40 PM  Result Value Ref Range   Rapid Strep A Screen Negative Negative     ASSESSMENT & PLAN:  1. Fever, unspecified   2. Sore throat      Meds ordered this encounter  Medications  . cetirizine (ZYRTEC) 10 MG tablet    Sig: Take 1 tablet (10 mg total) by mouth daily.    Dispense:  30 tablet    Refill:  0    Order Specific Question:   Supervising Provider    Answer:   Raylene Everts [[id number]]  . fluticasone (FLONASE)  50 MCG/ACT nasal spray    Sig: Place 2 sprays into both nostrils daily.    Dispense:  16 g    Refill:  0    Order Specific Question:   Supervising Provider    Answer:   Raylene Everts [[id number]]  . acetaminophen (TYLENOL) tablet 975 mg    Strep negative.  Culture sent.  WE will call you with abnormal results COVID testing ordered.  It will take between 5-7 days for test results.  Someone will contact you regarding abnormal results.    In the meantime: You should remain isolated in your home for 10 days from symptom onset AND greater than 72 hours after symptoms resolution (absence of fever without the use of fever-reducing medication and improvement in respiratory symptoms), whichever is longer Get plenty of rest and push fluids Use OTC zyrtec for nasal congestion, runny nose, and/or sore throat Use OTC flonase for nasal congestion and runny nose Use medications daily for symptom relief Use OTC medications like ibuprofen or tylenol as needed fever or pain Call or go to the ED if you have any new or worsening symptoms such as fever, cough, shortness of breath, chest tightness, chest pain, turning blue, changes in mental status, etc...   Tylenol given in office  Reviewed expectations re: course of current medical issues. Questions answered. Outlined signs and symptoms indicating need for more acute intervention. Patient verbalized understanding. After Visit Summary given.         Lestine Box, PA-C 03/29/20 1809

## 2020-03-29 NOTE — Discharge Instructions (Signed)
Strep negative.  Culture sent.  WE will call you with abnormal results COVID testing ordered.  It will take between 5-7 days for test results.  Someone will contact you regarding abnormal results.    In the meantime: You should remain isolated in your home for 10 days from symptom onset AND greater than 72 hours after symptoms resolution (absence of fever without the use of fever-reducing medication and improvement in respiratory symptoms), whichever is longer Get plenty of rest and push fluids Use OTC zyrtec for nasal congestion, runny nose, and/or sore throat Use OTC flonase for nasal congestion and runny nose Use medications daily for symptom relief Use OTC medications like ibuprofen or tylenol as needed fever or pain Call or go to the ED if you have any new or worsening symptoms such as fever, cough, shortness of breath, chest tightness, chest pain, turning blue, changes in mental status, etc..Marland Kitchen

## 2020-03-30 ENCOUNTER — Telehealth (INDEPENDENT_AMBULATORY_CARE_PROVIDER_SITE_OTHER): Payer: Medicaid Other | Admitting: Nurse Practitioner

## 2020-03-30 ENCOUNTER — Encounter: Payer: Self-pay | Admitting: Nurse Practitioner

## 2020-03-30 DIAGNOSIS — J039 Acute tonsillitis, unspecified: Secondary | ICD-10-CM | POA: Diagnosis not present

## 2020-03-30 LAB — SARS-COV-2, NAA 2 DAY TAT

## 2020-03-30 LAB — NOVEL CORONAVIRUS, NAA: SARS-CoV-2, NAA: NOT DETECTED

## 2020-03-30 MED ORDER — AMOXICILLIN 400 MG/5ML PO SUSR: ORAL | 0 refills | Status: DC

## 2020-03-30 NOTE — Progress Notes (Signed)
   Virtual Visit via telephone Note Due to COVID-19 pandemic this visit was conducted virtually. This visit type was conducted due to national recommendations for restrictions regarding the COVID-19 Pandemic (e.g. social distancing, sheltering in place) in an effort to limit this patient's exposure and mitigate transmission in our community. All issues noted in this document were discussed and addressed.  A physical exam was not performed with this format.  I connected with Colleen Valentine on 03/30/20 at 1:00 by telephone and verified that I am speaking with the correct person using two identifiers. Colleen Valentine is currently located at home and nonone is currently with her during visit. The provider, Mary-Margaret Hassell Done, FNP is located in their office at time of visit.  I discussed the limitations, risks, security and privacy concerns of performing an evaluation and management service by telephone and the availability of in person appointments. I also discussed with the patient that there may be a patient responsible charge related to this service. The patient expressed understanding and agreed to proceed.   History and Present Illness:   Chief Complaint: Sore Throat   HPI Patient calls in for telephone appointment. She is c/o tonsillitis. Both tonsils re swollen and she can hardly swallow. Has nasty taste in her mouth. She says it even hurts to talk. She is running a low grade fever. She is [redacted] weeks pregnant. She went to urgent care yesterday but they did not give hr anything.   Review of Systems  Constitutional: Positive for chills, fever and malaise/fatigue.  HENT: Positive for sore throat.   Respiratory: Negative for cough.   Musculoskeletal: Negative for myalgias.  Neurological: Positive for headaches.     Observations/Objective: Alert and oriented- answers all questions appropriately No distress Voice is froggy Can tell she is having trouble swallowing her salivia. Patient  describes tonsils as red and swollen  Assessment and Plan: Colleen Valentine in today with chief complaint of Sore Throat   1. Tonsillitis Force fluids Motrin or tylenol OTC OTC decongestant Throat lozenges if help New toothbrush in 3 days  - amoxicillin (AMOXIL) 400 MG/5ML suspension; 2 tsp po bid for 10 days  Dispense: 200 mL; Refill: 0     Follow Up Instructions: prn    I discussed the assessment and treatment plan with the patient. The patient was provided an opportunity to ask questions and all were answered. The patient agreed with the plan and demonstrated an understanding of the instructions.   The patient was advised to call back or seek an in-person evaluation if the symptoms worsen or if the condition fails to improve as anticipated.  The above assessment and management plan was discussed with the patient. The patient verbalized understanding of and has agreed to the management plan. Patient is aware to call the clinic if symptoms persist or worsen. Patient is aware when to return to the clinic for a follow-up visit. Patient educated on when it is appropriate to go to the emergency department.   Time call ended:  1:15  I provided 15 minutes of non-face-to-face time during this encounter.    Mary-Margaret Hassell Done, FNP

## 2020-04-01 LAB — CULTURE, GROUP A STREP (THRC): Special Requests: NORMAL

## 2020-04-03 ENCOUNTER — Telehealth (HOSPITAL_COMMUNITY): Payer: Self-pay | Admitting: Emergency Medicine

## 2020-04-03 NOTE — Telephone Encounter (Signed)
Error in charting on Strep culture for patient, unable to edit on comment.  Patient's strep positive for Non-Group A.  Patient was seen on telephone visit the day after her visit with [place] and placed on Amoxicillin.  No change to plan of care at this time.

## 2020-04-18 DIAGNOSIS — O26892 Other specified pregnancy related conditions, second trimester: Secondary | ICD-10-CM | POA: Diagnosis not present

## 2020-04-18 DIAGNOSIS — Z3689 Encounter for other specified antenatal screening: Secondary | ICD-10-CM | POA: Diagnosis not present

## 2020-04-18 DIAGNOSIS — N898 Other specified noninflammatory disorders of vagina: Secondary | ICD-10-CM | POA: Diagnosis not present

## 2020-04-27 ENCOUNTER — Telehealth: Payer: Medicaid Other | Admitting: Family

## 2020-04-27 DIAGNOSIS — J019 Acute sinusitis, unspecified: Secondary | ICD-10-CM | POA: Diagnosis not present

## 2020-04-27 MED ORDER — AMOXICILLIN-POT CLAVULANATE 875-125 MG PO TABS: 1.0000 | ORAL_TABLET | Freq: Two times a day (BID) | ORAL | 0 refills | Status: AC

## 2020-04-27 NOTE — Progress Notes (Signed)
We are sorry that you are not feeling well.  Here is how we plan to help!  Based on what you have shared with me it looks like you have sinusitis.  Sinusitis is inflammation and infection in the sinus cavities of the head.  Based on your presentation I believe you most likely have Acute Bacterial Sinusitis.  This is an infection caused by bacteria and is treated with antibiotics. I have prescribed Augmentin 875mg /125mg  one tablet twice daily with food, for 7 days. You may use an oral decongestant such as Mucinex D or if you have glaucoma or high blood pressure use plain Mucinex. Saline nasal spray help and can safely be used as often as needed for congestion.  If you develop worsening sinus pain, fever or notice severe headache and vision changes, or if symptoms are not better after completion of antibiotic, please schedule an appointment with a health care provider.    This antibiotic is considered safe during pregnancy.   Sinus infections are not as easily transmitted as other respiratory infection, however we still recommend that you avoid close contact with loved ones, especially the very young and elderly.  Remember to wash your hands thoroughly throughout the day as this is the number one way to prevent the spread of infection!  Home Care:  Only take medications as instructed by your medical team.  Complete the entire course of an antibiotic.  Do not take these medications with alcohol.  A steam or ultrasonic humidifier can help congestion.  You can place a towel over your head and breathe in the steam from hot water coming from a faucet.  Avoid close contacts especially the very young and the elderly.  Cover your mouth when you cough or sneeze.  Always remember to wash your hands.  Get Help Right Away If:  You develop worsening fever or sinus pain.  You develop a severe head ache or visual changes.  Your symptoms persist after you have completed your treatment plan.  Make sure  you  Understand these instructions.  Will watch your condition.  Will get help right away if you are not doing well or get worse.  Your e-visit answers were reviewed by a board certified advanced clinical practitioner to complete your personal care plan.  Depending on the condition, your plan could have included both over the counter or prescription medications.  If there is a problem please reply  once you have received a response from your provider.  Your safety is important to [place].  If you have drug allergies check your prescription carefully.    You can use MyChart to ask questions about today's visit, request a non-urgent call back, or ask for a work or school excuse for 24 hours related to this e-Visit. If it has been greater than 24 hours you will need to follow up with your provider, or enter a new e-Visit to address those concerns.  You will get an e-mail in the next two days asking about your experience.  I hope that your e-visit has been valuable and will speed your recovery. Thank you for using e-visits.  Greater than 5 minutes, yet less than 10 minutes of time have been spent researching, coordinating, and implementing care for this patient.

## 2020-05-09 DIAGNOSIS — Z363 Encounter for antenatal screening for malformations: Secondary | ICD-10-CM | POA: Diagnosis not present

## 2020-05-29 ENCOUNTER — Telehealth: Payer: Medicaid Other | Admitting: Family

## 2020-05-29 DIAGNOSIS — J019 Acute sinusitis, unspecified: Secondary | ICD-10-CM | POA: Diagnosis not present

## 2020-05-29 MED ORDER — AMOXICILLIN-POT CLAVULANATE 250-62.5 MG/5ML PO SUSR: 875.0000 mg | Freq: Two times a day (BID) | ORAL | 0 refills | Status: AC

## 2020-05-29 NOTE — Progress Notes (Signed)
We are sorry that you are not feeling well.  Here is how we plan to help!  Based on what you have shared with me it looks like you have sinusitis.  Sinusitis is inflammation and infection in the sinus cavities of the head.  Based on your presentation I believe you most likely have Acute Bacterial Sinusitis.  This is an infection caused by bacteria and is treated with antibiotics. I have prescribed Augmentin 875mg /125mg  liquid twice daily with food, for 7 days. You may use an oral decongestant such as Mucinex D or if you have glaucoma or high blood pressure use plain Mucinex. Saline nasal spray help and can safely be used as often as needed for congestion.  If you develop worsening sinus pain, fever or notice severe headache and vision changes, or if symptoms are not better after completion of antibiotic, please schedule an appointment with a health care provider.    Sinus infections are not as easily transmitted as other respiratory infection, however we still recommend that you avoid close contact with loved ones, especially the very young and elderly.  Remember to wash your hands thoroughly throughout the day as this is the number one way to prevent the spread of infection!  Home Care:  Only take medications as instructed by your medical team.  Complete the entire course of an antibiotic.  Do not take these medications with alcohol.  A steam or ultrasonic humidifier can help congestion.  You can place a towel over your head and breathe in the steam from hot water coming from a faucet.  Avoid close contacts especially the very young and the elderly.  Cover your mouth when you cough or sneeze.  Always remember to wash your hands.  Get Help Right Away If:  You develop worsening fever or sinus pain.  You develop a severe head ache or visual changes.  Your symptoms persist after you have completed your treatment plan.  Make sure you  Understand these instructions.  Will watch your  condition.  Will get help right away if you are not doing well or get worse.  Your e-visit answers were reviewed by a board certified advanced clinical practitioner to complete your personal care plan.  Depending on the condition, your plan could have included both over the counter or prescription medications.  If there is a problem please reply  once you have received a response from your provider.  Your safety is important to .  If you have drug allergies check your prescription carefully.    You can use MyChart to ask questions about today's visit, request a non-urgent call back, or ask for a work or school excuse for 24 hours related to this e-Visit. If it has been greater than 24 hours you will need to follow up with your provider, or enter a new e-Visit to address those concerns.  You will get an e-mail in the next two days asking about your experience.  I hope that your e-visit has been valuable and will speed your recovery. Thank you for using e-visits.   Approximately 5 minutes was spent documenting and reviewing patient's chart.

## 2020-07-02 ENCOUNTER — Telehealth: Payer: Medicaid Other | Admitting: Nurse Practitioner

## 2020-07-02 DIAGNOSIS — N760 Acute vaginitis: Secondary | ICD-10-CM | POA: Diagnosis not present

## 2020-07-02 DIAGNOSIS — O4690 Antepartum hemorrhage, unspecified, unspecified trimester: Secondary | ICD-10-CM | POA: Diagnosis not present

## 2020-07-02 DIAGNOSIS — B9689 Other specified bacterial agents as the cause of diseases classified elsewhere: Secondary | ICD-10-CM | POA: Diagnosis not present

## 2020-07-02 DIAGNOSIS — B379 Candidiasis, unspecified: Secondary | ICD-10-CM

## 2020-07-02 MED ORDER — FLUCONAZOLE 150 MG PO TABS: 150.0000 mg | ORAL_TABLET | Freq: Once | ORAL | 0 refills | Status: AC

## 2020-07-02 MED ORDER — METRONIDAZOLE 500 MG PO TABS: 500.0000 mg | ORAL_TABLET | Freq: Two times a day (BID) | ORAL | 0 refills | Status: AC

## 2020-07-02 NOTE — Progress Notes (Signed)
We are sorry that you are not feeling well. Here is how we plan to help! Based on what you shared with me it looks like you: May have a vaginosis due to bacteria and yeast.  Vaginosis is an inflammation of the vagina that can result in discharge, itching and pain. The cause is usually a change in the normal balance of vaginal bacteria or an infection. Vaginosis can also result from reduced estrogen levels after menopause.  The most common causes of vaginosis are:   Bacterial vaginosis which results from an overgrowth of one on several organisms that are normally present in your vagina.   Yeast infections which are caused by a naturally occurring fungus called candida.   Vaginal atrophy (atrophic vaginosis) which results from the thinning of the vagina from reduced estrogen levels after menopause.   Trichomoniasis which is caused by a parasite and is commonly transmitted by sexual intercourse.  Factors that increase your risk of developing vaginosis include: Marland Kitchen Medications, such as antibiotics and steroids . Uncontrolled diabetes . Use of hygiene products such as bubble bath, vaginal spray or vaginal deodorant . Douching . Wearing damp or tight-fitting clothing . Using an intrauterine device (IUD) for birth control . Hormonal changes, such as those associated with pregnancy, birth control pills or menopause . Sexual activity . Having a sexually transmitted infection  Your treatment plan is Metronidazole or Flagyl 500mg  twice a day for 7 days.  I have electronically sent this prescription into the pharmacy that you have chosen. I am also prescribing a single dose of Diflucan (fluconazole) for your symptoms. If you would like to be screened for STDs, you can go to the local urgent care or health department.   Be sure to take all of the medication as directed. Stop taking any medication if you develop a rash, tongue swelling or shortness of breath. Mothers who are breast feeding should  consider pumping and discarding their breast milk while on these antibiotics. However, there is no consensus that infant exposure at these doses would be harmful.  Remember that medication creams can weaken latex condoms. Marland Kitchen   HOME CARE:  Good hygiene may prevent some types of vaginosis from recurring and may relieve some symptoms:  . Avoid baths, hot tubs and whirlpool spas. Rinse soap from your outer genital area after a shower, and dry the area well to prevent irritation. Don't use scented or harsh soaps, such as those with deodorant or antibacterial action. Marland Kitchen Avoid irritants. These include scented tampons and pads. . Wipe from front to back after using the toilet. Doing so avoids spreading fecal bacteria to your vagina.  Other things that may help prevent vaginosis include:  Marland Kitchen Don't douche. Your vagina doesn't require cleansing other than normal bathing. Repetitive douching disrupts the normal organisms that reside in the vagina and can actually increase your risk of vaginal infection. Douching won't clear up a vaginal infection. . Use a latex condom. Both female and female latex condoms may help you avoid infections spread by sexual contact. . Wear cotton underwear. Also wear pantyhose with a cotton crotch. If you feel comfortable without it, skip wearing underwear to bed. Yeast thrives in [employer] Your symptoms should improve in the next day or two.  GET HELP RIGHT AWAY IF:  . You have pain in your lower abdomen ( pelvic area or over your ovaries) . You develop nausea or vomiting . You develop a fever . Your discharge changes or worsens . You have persistent  pain with intercourse . You develop shortness of breath, a rapid pulse, or you faint.  These symptoms could be signs of problems or infections that need to be evaluated by a medical provider now.  MAKE SURE YOU    Understand these instructions.  Will watch your condition.  Will get help right away if you are not  doing well or get worse.  Your e-visit answers were reviewed by a board certified advanced clinical practitioner to complete your personal care plan. Depending upon the condition, your plan could have included both over the counter or prescription medications. Please review your pharmacy choice to make sure that you have choses a pharmacy that is open for you to pick up any needed prescription, Your safety is important to [place]. If you have drug allergies check your prescription carefully.   You can use MyChart to ask questions about today's visit, request a non-urgent call back, or ask for a work or school excuse for 24 hours related to this e-Visit. If it has been greater than 24 hours you will need to follow up with your provider, or enter a new e-Visit to address those concerns. You will get a MyChart message within the next two days asking about your experience. I hope that your e-visit has been valuable and will speed your recovery.  I have spent at least 5 minutes reviewing and documenting in the patient's chart.

## 2020-07-03 DIAGNOSIS — Z113 Encounter for screening for infections with a predominantly sexual mode of transmission: Secondary | ICD-10-CM | POA: Diagnosis not present

## 2020-07-12 DIAGNOSIS — Z23 Encounter for immunization: Secondary | ICD-10-CM | POA: Diagnosis not present

## 2020-07-12 DIAGNOSIS — Z3A28 28 weeks gestation of pregnancy: Secondary | ICD-10-CM | POA: Diagnosis not present

## 2020-07-12 DIAGNOSIS — L02422 Furuncle of left axilla: Secondary | ICD-10-CM | POA: Diagnosis not present

## 2020-07-12 DIAGNOSIS — Z3689 Encounter for other specified antenatal screening: Secondary | ICD-10-CM | POA: Diagnosis not present

## 2020-07-13 DIAGNOSIS — Z3A28 28 weeks gestation of pregnancy: Secondary | ICD-10-CM | POA: Diagnosis not present

## 2020-07-13 DIAGNOSIS — L02412 Cutaneous abscess of left axilla: Secondary | ICD-10-CM | POA: Diagnosis not present

## 2020-07-13 DIAGNOSIS — L03112 Cellulitis of left axilla: Secondary | ICD-10-CM | POA: Diagnosis not present

## 2020-07-13 DIAGNOSIS — O99713 Diseases of the skin and subcutaneous tissue complicating pregnancy, third trimester: Secondary | ICD-10-CM | POA: Diagnosis not present

## 2020-07-14 ENCOUNTER — Telehealth: Payer: Self-pay

## 2020-07-14 DIAGNOSIS — R0602 Shortness of breath: Secondary | ICD-10-CM | POA: Diagnosis not present

## 2020-07-14 DIAGNOSIS — R000 Tachycardia, unspecified: Secondary | ICD-10-CM | POA: Diagnosis not present

## 2020-07-14 DIAGNOSIS — R457 State of emotional shock and stress, unspecified: Secondary | ICD-10-CM | POA: Diagnosis not present

## 2020-07-14 NOTE — Telephone Encounter (Signed)
Transition Care Management Unsuccessful Follow-up Telephone Call  Date of discharge and from where:  07/13/2020 from [facility]  Attempts:  1st Attempt  Reason for unsuccessful TCM follow-up call:  Left voice message

## 2020-07-15 ENCOUNTER — Encounter: Payer: Self-pay | Admitting: Physician Assistant

## 2020-07-15 ENCOUNTER — Telehealth: Payer: Medicaid Other | Admitting: Physician Assistant

## 2020-07-15 DIAGNOSIS — L02412 Cutaneous abscess of left axilla: Secondary | ICD-10-CM | POA: Diagnosis not present

## 2020-07-15 MED ORDER — SULFAMETHOXAZOLE-TRIMETHOPRIM 800-160 MG PO TABS: 1.0000 | ORAL_TABLET | Freq: Two times a day (BID) | ORAL | 0 refills | Status: DC

## 2020-07-15 NOTE — Progress Notes (Signed)
E Visit for Cellulitis  We are sorry that you are not feeling well. Here is how we plan to help!  Based on what you shared with me it looks like you have cellulitis.  Cellulitis looks like areas of skin redness, swelling, and warmth; it develops as a result of bacteria entering under the skin. Little red spots and/or bleeding can be seen in skin, and tiny surface sacs containing fluid can occur. Fever can be present. Cellulitis is almost always on one side of a body, and the lower limbs are the most common site of involvement.   I have prescribed:  Bactrim DS 1 tablet by mouth twice a day for 7 days    Ms. Darrick Grinder,  I will prescribe an antibiotic for you that may provide better coverage. It is Bactrim one pill twice daily for 7 days. There is a small risk of harm to the fetus with use of this antibiotic. If your symptoms are not improving after 24 hours of use of the antibiotic, please have a face to face visit for further evaluation, as this may indicate that the incision and drainage you had completed did not treat the abscess or that you may have more abscess inside.   HOME CARE:  . Take your medications as ordered and take all of them, even if the skin irritation appears to be healing.   GET HELP RIGHT AWAY IF:  . Symptoms that don't begin to go away within 48 hours. . Severe redness persists or worsens . If the area turns color, spreads or swells. . If it blisters and opens, develops yellow-brown crust or bleeds. . You develop a fever or chills. . If the pain increases or becomes unbearable.  . Are unable to keep fluids and food down.  MAKE SURE YOU    Understand these instructions.  Will watch your condition.  Will get help right away if you are not doing well or get worse.  Thank you for choosing an e-visit. Your e-visit answers were reviewed by a board certified advanced clinical practitioner to complete your personal care plan. Depending upon the condition, your plan could  have included both over the counter or prescription medications. Please review your pharmacy choice. Make sure the pharmacy is open so you can pick up prescription now. If there is a problem, you may contact your provider through [employer] and have the prescription routed to another pharmacy. Your safety is important to [place]. If you have drug allergies check your prescription carefully.  For the next 24 hours you can use MyChart to ask questions about today's visit, request a non-urgent call back, or ask for a work or school excuse. You will get an email in the next two days asking about your experience. I hope that your e-visit has been valuable and will speed your recovery. I spent 5-10 minutes on review and completion of this note- Lacy Duverney [facility]

## 2020-07-19 DIAGNOSIS — Z331 Pregnant state, incidental: Secondary | ICD-10-CM | POA: Diagnosis not present

## 2020-07-19 DIAGNOSIS — Z8739 Personal history of other diseases of the musculoskeletal system and connective tissue: Secondary | ICD-10-CM | POA: Diagnosis not present

## 2020-07-19 DIAGNOSIS — Z01818 Encounter for other preprocedural examination: Secondary | ICD-10-CM | POA: Diagnosis not present

## 2020-07-19 DIAGNOSIS — Z3A28 28 weeks gestation of pregnancy: Secondary | ICD-10-CM | POA: Diagnosis not present

## 2020-07-19 DIAGNOSIS — L02412 Cutaneous abscess of left axilla: Secondary | ICD-10-CM | POA: Insufficient documentation

## 2020-07-19 DIAGNOSIS — R509 Fever, unspecified: Secondary | ICD-10-CM | POA: Diagnosis not present

## 2020-07-19 DIAGNOSIS — O99713 Diseases of the skin and subcutaneous tissue complicating pregnancy, third trimester: Secondary | ICD-10-CM | POA: Diagnosis not present

## 2020-07-19 DIAGNOSIS — R112 Nausea with vomiting, unspecified: Secondary | ICD-10-CM | POA: Diagnosis not present

## 2020-07-19 DIAGNOSIS — R000 Tachycardia, unspecified: Secondary | ICD-10-CM | POA: Diagnosis not present

## 2020-07-19 DIAGNOSIS — F418 Other specified anxiety disorders: Secondary | ICD-10-CM | POA: Diagnosis not present

## 2020-07-19 DIAGNOSIS — I959 Hypotension, unspecified: Secondary | ICD-10-CM | POA: Diagnosis not present

## 2020-07-19 DIAGNOSIS — R2232 Localized swelling, mass and lump, left upper limb: Secondary | ICD-10-CM | POA: Diagnosis not present

## 2020-07-19 DIAGNOSIS — R110 Nausea: Secondary | ICD-10-CM | POA: Diagnosis not present

## 2020-07-20 DIAGNOSIS — Z3A00 Weeks of gestation of pregnancy not specified: Secondary | ICD-10-CM | POA: Diagnosis not present

## 2020-07-20 DIAGNOSIS — R7881 Bacteremia: Secondary | ICD-10-CM | POA: Insufficient documentation

## 2020-07-20 DIAGNOSIS — B9689 Other specified bacterial agents as the cause of diseases classified elsewhere: Secondary | ICD-10-CM | POA: Diagnosis not present

## 2020-07-20 DIAGNOSIS — L02412 Cutaneous abscess of left axilla: Secondary | ICD-10-CM | POA: Diagnosis not present

## 2020-07-21 DIAGNOSIS — Z3A00 Weeks of gestation of pregnancy not specified: Secondary | ICD-10-CM | POA: Diagnosis not present

## 2020-07-21 DIAGNOSIS — R7881 Bacteremia: Secondary | ICD-10-CM | POA: Diagnosis not present

## 2020-07-21 DIAGNOSIS — L02412 Cutaneous abscess of left axilla: Secondary | ICD-10-CM | POA: Diagnosis not present

## 2020-07-22 DIAGNOSIS — Z3A00 Weeks of gestation of pregnancy not specified: Secondary | ICD-10-CM | POA: Diagnosis not present

## 2020-07-22 DIAGNOSIS — L02412 Cutaneous abscess of left axilla: Secondary | ICD-10-CM | POA: Diagnosis not present

## 2020-07-22 DIAGNOSIS — R7881 Bacteremia: Secondary | ICD-10-CM | POA: Diagnosis not present

## 2020-07-24 ENCOUNTER — Telehealth: Payer: Self-pay | Admitting: *Deleted

## 2020-07-24 NOTE — Telephone Encounter (Signed)
Transition Care Management Unsuccessful Follow-up Telephone Call  Date of discharge and from where:  07/24/2020 [facility] ED  Attempts:  1st Attempt  Reason for unsuccessful TCM follow-up call:  Left voice message

## 2020-07-25 DIAGNOSIS — Z09 Encounter for follow-up examination after completed treatment for conditions other than malignant neoplasm: Secondary | ICD-10-CM | POA: Diagnosis not present

## 2020-07-25 DIAGNOSIS — L02412 Cutaneous abscess of left axilla: Secondary | ICD-10-CM | POA: Diagnosis not present

## 2020-07-25 NOTE — Telephone Encounter (Signed)
Transition Care Management Follow-up Telephone Call  Date of discharge and from where: 07/22/2020 [facility] ED  How have you been since you were released from the hospital? "About the same"  Any questions or concerns? No  Items Reviewed:  Did the pt receive and understand the discharge instructions provided? Yes   Medications obtained and verified? Yes   Other? No   Any new allergies since your discharge? No   Dietary orders reviewed? Yes  Do you have support at home? Yes   Home Care and Equipment/Supplies: Were home health services ordered? not applicable If so, what is the name of the agency? N/A  Has the agency set up a time to come to the patient's home? not applicable Were any new equipment or medical supplies ordered?  No What is the name of the medical supply agency? N/A Were you able to get the supplies/equipment? not applicable Do you have any questions related to the use of the equipment or supplies? No  Functional Questionnaire: (I = Independent and D = Dependent) ADLs: I  Bathing/Dressing- I  Meal Prep- I  Eating- I  Maintaining continence- I  Transferring/Ambulation- I  Managing Meds- I  Follow up appointments reviewed:   PCP Hospital f/u appt confirmed? No    Specialist Hospital f/u appt confirmed? No  She has several upcoming appointments for wound care per the transition summary.  Are transportation arrangements needed? No   If their condition worsens, is the pt aware to call PCP or go to the Emergency Dept.? Yes  Was the patient provided with contact information for the PCP's office or ED? Yes  Was to pt encouraged to call back with questions or concerns? Yes

## 2020-07-26 DIAGNOSIS — O368130 Decreased fetal movements, third trimester, not applicable or unspecified: Secondary | ICD-10-CM | POA: Diagnosis not present

## 2020-07-29 ENCOUNTER — Other Ambulatory Visit: Payer: Self-pay

## 2020-07-29 ENCOUNTER — Inpatient Hospital Stay (HOSPITAL_COMMUNITY)
Admission: AD | Admit: 2020-07-29 | Discharge: 2020-08-03 | DRG: 832 | Discharge status: Against Medical Advice | Payer: Medicaid Other | Attending: Obstetrics & Gynecology | Admitting: Obstetrics & Gynecology

## 2020-07-29 ENCOUNTER — Inpatient Hospital Stay (HOSPITAL_BASED_OUTPATIENT_CLINIC_OR_DEPARTMENT_OTHER): Payer: Medicaid Other

## 2020-07-29 ENCOUNTER — Encounter (HOSPITAL_COMMUNITY): Payer: Self-pay | Admitting: Obstetrics and Gynecology

## 2020-07-29 DIAGNOSIS — O99019 Anemia complicating pregnancy, unspecified trimester: Secondary | ICD-10-CM | POA: Diagnosis present

## 2020-07-29 DIAGNOSIS — O99323 Drug use complicating pregnancy, third trimester: Secondary | ICD-10-CM | POA: Diagnosis present

## 2020-07-29 DIAGNOSIS — L02412 Cutaneous abscess of left axilla: Secondary | ICD-10-CM | POA: Diagnosis present

## 2020-07-29 DIAGNOSIS — O24415 Gestational diabetes mellitus in pregnancy, controlled by oral hypoglycemic drugs: Secondary | ICD-10-CM | POA: Diagnosis not present

## 2020-07-29 DIAGNOSIS — F1210 Cannabis abuse, uncomplicated: Secondary | ICD-10-CM | POA: Diagnosis present

## 2020-07-29 DIAGNOSIS — O99713 Diseases of the skin and subcutaneous tissue complicating pregnancy, third trimester: Secondary | ICD-10-CM | POA: Diagnosis present

## 2020-07-29 DIAGNOSIS — O4693 Antepartum hemorrhage, unspecified, third trimester: Secondary | ICD-10-CM

## 2020-07-29 DIAGNOSIS — O321XX0 Maternal care for breech presentation, not applicable or unspecified: Secondary | ICD-10-CM | POA: Diagnosis present

## 2020-07-29 DIAGNOSIS — Z3A31 31 weeks gestation of pregnancy: Secondary | ICD-10-CM | POA: Diagnosis not present

## 2020-07-29 DIAGNOSIS — O2623 Pregnancy care for patient with recurrent pregnancy loss, third trimester: Secondary | ICD-10-CM

## 2020-07-29 DIAGNOSIS — O99810 Abnormal glucose complicating pregnancy: Secondary | ICD-10-CM | POA: Diagnosis not present

## 2020-07-29 DIAGNOSIS — A6000 Herpesviral infection of urogenital system, unspecified: Secondary | ICD-10-CM | POA: Diagnosis present

## 2020-07-29 DIAGNOSIS — Z3A30 30 weeks gestation of pregnancy: Secondary | ICD-10-CM

## 2020-07-29 DIAGNOSIS — O368330 Maternal care for abnormalities of the fetal heart rate or rhythm, third trimester, not applicable or unspecified: Secondary | ICD-10-CM | POA: Diagnosis present

## 2020-07-29 DIAGNOSIS — Z87891 Personal history of nicotine dependence: Secondary | ICD-10-CM

## 2020-07-29 DIAGNOSIS — O99213 Obesity complicating pregnancy, third trimester: Secondary | ICD-10-CM

## 2020-07-29 DIAGNOSIS — O98313 Other infections with a predominantly sexual mode of transmission complicating pregnancy, third trimester: Secondary | ICD-10-CM | POA: Diagnosis present

## 2020-07-29 DIAGNOSIS — Z5329 Procedure and treatment not carried out because of patient's decision for other reasons: Secondary | ICD-10-CM | POA: Diagnosis present

## 2020-07-29 DIAGNOSIS — D649 Anemia, unspecified: Secondary | ICD-10-CM | POA: Diagnosis not present

## 2020-07-29 DIAGNOSIS — O99013 Anemia complicating pregnancy, third trimester: Secondary | ICD-10-CM | POA: Diagnosis present

## 2020-07-29 DIAGNOSIS — E669 Obesity, unspecified: Secondary | ICD-10-CM

## 2020-07-29 DIAGNOSIS — O24419 Gestational diabetes mellitus in pregnancy, unspecified control: Secondary | ICD-10-CM | POA: Diagnosis present

## 2020-07-29 DIAGNOSIS — O360930 Maternal care for other rhesus isoimmunization, third trimester, not applicable or unspecified: Secondary | ICD-10-CM

## 2020-07-29 DIAGNOSIS — O468X3 Other antepartum hemorrhage, third trimester: Principal | ICD-10-CM | POA: Diagnosis present

## 2020-07-29 DIAGNOSIS — Z20822 Contact with and (suspected) exposure to covid-19: Secondary | ICD-10-CM | POA: Diagnosis present

## 2020-07-29 DIAGNOSIS — Z6841 Body Mass Index (BMI) 40.0 and over, adult: Secondary | ICD-10-CM

## 2020-07-29 DIAGNOSIS — O4690 Antepartum hemorrhage, unspecified, unspecified trimester: Secondary | ICD-10-CM | POA: Diagnosis present

## 2020-07-29 LAB — CBC
HCT: 29.8 % — ABNORMAL LOW (ref 36.0–46.0)
Hemoglobin: 9.8 g/dL — ABNORMAL LOW (ref 12.0–15.0)
MCH: 30 pg (ref 26.0–34.0)
MCHC: 32.9 g/dL (ref 30.0–36.0)
MCV: 91.1 fL (ref 80.0–100.0)
Platelets: 304 10*3/uL (ref 150–400)
RBC: 3.27 MIL/uL — ABNORMAL LOW (ref 3.87–5.11)
RDW: 14.8 % (ref 11.5–15.5)
WBC: 10.5 10*3/uL (ref 4.0–10.5)
nRBC: 0 % (ref 0.0–0.2)

## 2020-07-29 LAB — URINALYSIS, ROUTINE W REFLEX MICROSCOPIC
Bilirubin Urine: NEGATIVE
Glucose, UA: NEGATIVE mg/dL
Ketones, ur: NEGATIVE mg/dL
Nitrite: NEGATIVE
Protein, ur: NEGATIVE mg/dL
RBC / HPF: >50 RBC/hpf — ABNORMAL HIGH (ref 0–5)
Specific Gravity, Urine: 1.017 (ref 1.005–1.030)
pH: 7 (ref 5.0–8.0)

## 2020-07-29 LAB — RETICULOCYTES
Immature Retic Fract: 29.8 % — ABNORMAL HIGH (ref 2.3–15.9)
RBC.: 3.21 MIL/uL — ABNORMAL LOW (ref 3.87–5.11)
Retic Count, Absolute: 145.7 10*3/uL (ref 19.0–186.0)
Retic Ct Pct: 4.5 % — ABNORMAL HIGH (ref 0.4–3.1)

## 2020-07-29 LAB — TYPE AND SCREEN
ABO/RH(D): O POS
Antibody Screen: NEGATIVE

## 2020-07-29 LAB — IRON AND TIBC
Iron: 89 ug/dL (ref 28–170)
Saturation Ratios: 18 % (ref 10.4–31.8)
TIBC: 493 ug/dL — ABNORMAL HIGH (ref 250–450)
UIBC: 404 ug/dL

## 2020-07-29 LAB — RAPID URINE DRUG SCREEN, HOSP PERFORMED
Amphetamines: NOT DETECTED
Barbiturates: NOT DETECTED
Benzodiazepines: NOT DETECTED
Cocaine: NOT DETECTED
Opiates: NOT DETECTED
Tetrahydrocannabinol: POSITIVE — AB

## 2020-07-29 LAB — FERRITIN: Ferritin: 31 ng/mL (ref 11–307)

## 2020-07-29 LAB — WET PREP, GENITAL
Clue Cells Wet Prep HPF POC: NONE SEEN
Sperm: NONE SEEN
Trich, Wet Prep: NONE SEEN
Yeast Wet Prep HPF POC: NONE SEEN

## 2020-07-29 LAB — GLUCOSE, CAPILLARY
Glucose-Capillary: 132 mg/dL — ABNORMAL HIGH (ref 70–99)
Glucose-Capillary: 124 mg/dL — ABNORMAL HIGH (ref 70–99)

## 2020-07-29 LAB — FOLATE: Folate: 4 ng/mL — ABNORMAL LOW (ref >5.9)

## 2020-07-29 LAB — RESP PANEL BY RT-PCR (FLU A&B, COVID) ARPGX2
Influenza A by PCR: NEGATIVE
Influenza B by PCR: NEGATIVE
SARS Coronavirus 2 by RT PCR: NEGATIVE

## 2020-07-29 LAB — VITAMIN B12: Vitamin B-12: 119 pg/mL — ABNORMAL LOW (ref 180–914)

## 2020-07-29 MED ORDER — BETAMETHASONE SOD PHOS & ACET 6 (3-3) MG/ML IJ SUSP
12.0000 mg | INTRAMUSCULAR | Status: AC
  Administered 2020-07-29 – 2020-07-30 (×2): 12 mg via INTRAMUSCULAR
  Filled 2020-07-29: qty 5

## 2020-07-29 MED ORDER — LAMOTRIGINE 25 MG PO TABS
25.0000 mg | ORAL_TABLET | Freq: Two times a day (BID) | ORAL | Status: DC
  Administered 2020-07-29 – 2020-08-03 (×10): 25 mg via ORAL
  Filled 2020-07-29 (×12): qty 1

## 2020-07-29 MED ORDER — SULFAMETHOXAZOLE-TRIMETHOPRIM 800-160 MG PO TABS
1.0000 | ORAL_TABLET | Freq: Two times a day (BID) | ORAL | Status: AC
  Administered 2020-07-29 – 2020-07-30 (×3): 1 via ORAL
  Filled 2020-07-29 (×3): qty 1

## 2020-07-29 MED ORDER — DOCUSATE SODIUM 100 MG PO CAPS: 100.0000 mg | ORAL_CAPSULE | Freq: Two times a day (BID) | ORAL | Status: DC | PRN

## 2020-07-29 MED ORDER — CALCIUM CARBONATE ANTACID 500 MG PO CHEW
2.0000 | CHEWABLE_TABLET | ORAL | Status: DC | PRN
  Administered 2020-07-31: 400 mg via ORAL
  Filled 2020-07-29: qty 2

## 2020-07-29 MED ORDER — LORATADINE 10 MG PO TABS
10.0000 mg | ORAL_TABLET | Freq: Every day | ORAL | Status: DC
  Administered 2020-07-30 – 2020-08-03 (×5): 10 mg via ORAL
  Filled 2020-07-29 (×7): qty 1

## 2020-07-29 MED ORDER — LACTATED RINGERS IV SOLN: INTRAVENOUS | Status: DC

## 2020-07-29 MED ORDER — ACETAMINOPHEN 325 MG PO TABS
650.0000 mg | ORAL_TABLET | ORAL | Status: DC | PRN
  Administered 2020-07-30 – 2020-08-02 (×6): 650 mg via ORAL
  Filled 2020-07-29 (×6): qty 2

## 2020-07-29 MED ORDER — ACETAMINOPHEN 500 MG PO TABS: 1000.0000 mg | ORAL_TABLET | Freq: Four times a day (QID) | ORAL | Status: DC | PRN

## 2020-07-29 NOTE — MAU Note (Signed)
Pt presents to unit with complaint of vaginal bleeding that started with a big gush around 0745; reports +FM

## 2020-07-29 NOTE — MAU Provider Note (Addendum)
History     CSN: [id number]  Arrival date and time: 07/29/20 1245  Chief Complaint  Patient presents with  . Vaginal Bleeding   25 y.o. [id number] @30 .6 wks presenting with VB. Reports a large amt into the toilet around 0740 today. Small amt of bleeding since. Endorses intermittent abdominal pain since the bleeding. Unsure if pain is ctx. Denies recent sex. No falls or injury. Reports daily MJ use and vaping. Reports abnormal GTT but did not have 3 hr. Reports [facility] in first trimester.   OB History    Gravida  5   Para  1   Term  1   Preterm      AB  3   Living  1     SAB  2   IAB  1   Ectopic      Multiple      Live Births  1           Past Medical History:  Diagnosis Date  . Anemia   . Anxiety   . Complication of anesthesia   . Depression   . GERD (gastroesophageal reflux disease)   . Herpes genitalia   . Insomnia   . Obesity   . PONV (postoperative nausea and vomiting)   . 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 Suicide [facility])     Past Surgical History:  Procedure Laterality Date  . CHOLECYSTECTOMY  2015  . COSMETIC SURGERY  Age 49   dog bite to face    Family History  Problem Relation Age of Onset  . Alcohol abuse Mother   . Stroke Mother   . Bipolar disorder Mother   . Drug abuse Mother   . Alcohol abuse Father   . Bipolar disorder Father   . Drug abuse Father   . Colon cancer Maternal Grandfather   . Crohn's disease Paternal Grandmother   . Crohn's disease Paternal Aunt   . Crohn's disease Paternal Aunt   . Crohn's disease Paternal Uncle   . Colon cancer Paternal Uncle   . Cancer Maternal Aunt        "stomach cancer"  . Celiac disease Neg Hx     Social History   Tobacco Use  . Smoking status: Former Smoker    Packs/day: 0.50    Years: 5.00    Pack years: 2.50    Types: Cigarettes    Quit date: 11/24/2019    Years since quitting: 0.6  . Smokeless tobacco: Never Used  . Tobacco comment: one pack cigarettes  daily  Vaping Use  . Vaping Use: Some days  Substance Use Topics  . Alcohol use: Not Currently    Alcohol/week: 0.0 standard drinks    Comment: social  . Drug use: Yes    Types: Marijuana    Comment: uses once a day    Allergies: No Known Allergies  Medications Prior to Admission  Medication Sig Dispense Refill Last Dose  . ibuprofen (ADVIL) 600 MG tablet Take 600 mg by mouth every 6 (six) hours as needed.   07/28/2020 at Unknown time  . lamoTRIgine (LAMICTAL) 25 MG tablet Take by mouth daily.   Past Month at Unknown time  . sulfamethoxazole-trimethoprim (BACTRIM DS) 800-160 MG tablet Take 1 tablet by mouth 2 (two) times daily. 14 tablet 0 Past Month at Unknown time  . valACYclovir (VALTREX) 1000 MG tablet Take 1 tablet (1,000 mg total) by mouth daily. 90 tablet 1 Past Month at  Unknown time  . cetirizine (ZYRTEC) 10 MG tablet Take 1 tablet (10 mg total) by mouth daily. 30 tablet 0 More than a month at Unknown time  . fluticasone (FLONASE) 50 MCG/ACT nasal spray Place 2 sprays into both nostrils daily. 16 g 0 More than a month at Unknown time  . hydrOXYzine (VISTARIL) 25 MG capsule Take 1 capsule (25 mg total) by mouth daily as needed for anxiety. 30 capsule 1   . venlafaxine XR (EFFEXOR-XR) 75 MG 24 hr capsule 75 mg daily. Start after 37.5 mg daily for one week 30 capsule 0     Review of Systems  Gastrointestinal: Positive for abdominal pain.  Genitourinary: Positive for vaginal bleeding.   Physical Exam   Blood pressure 117/69, pulse 83, temperature 98.2 F (36.8 C), temperature source Oral, resp. rate 17, height 5\' 5"  (1.651 m), weight 111.1 kg, last menstrual period 12/14/2019, SpO2 99 %, unknown if currently breastfeeding.  Physical Exam Vitals and nursing note reviewed. Exam conducted with a chaperone present.  Constitutional:      General: She is not in acute distress.    Appearance: Normal appearance.  Cardiovascular:     Rate and Rhythm: Normal rate.  Pulmonary:      Effort: Pulmonary effort is normal. No respiratory distress.  Abdominal:     Palpations: Abdomen is soft.     Tenderness: There is no abdominal tenderness.     Comments: gravid  Genitourinary:    Comments: External: no lesions or erythema Vagina: rugated, pink, moist, moderate thick bloody discharge Cervix closed/thick  Musculoskeletal:        General: Normal range of motion.  Skin:    General: Skin is warm and dry.  Neurological:     General: No focal deficit present.     Mental Status: She is alert and oriented to person, place, and time.  Psychiatric:        Mood and Affect: Mood is anxious.        Behavior: Behavior normal.   EFM: 130 bpm, mod variability, + accels, no decels Toco: none  Results for orders placed or performed during the hospital encounter of 07/29/20 (from the past 24 hour(s))  CBC     Status: Abnormal   Collection Time: 07/29/20  9:09 AM  Result Value Ref Range   WBC 10.5 4.0 - 10.5 K/uL   RBC 3.27 (L) 3.87 - 5.11 MIL/uL   Hemoglobin 9.8 (L) 12.0 - 15.0 g/dL   HCT 29.8 (L) 36.0 - 46.0 %   MCV 91.1 80.0 - 100.0 fL   MCH 30.0 26.0 - 34.0 pg   MCHC 32.9 30.0 - 36.0 g/dL   RDW 14.8 11.5 - 15.5 %   Platelets 304 150 - 400 K/uL   nRBC 0.0 0.0 - 0.2 %  Type and screen     Status: None   Collection Time: 07/29/20  9:09 AM  Result Value Ref Range   ABO/RH(D) O POS    Antibody Screen NEG    Sample Expiration      08/01/2020,2359 Performed at [facility], [place] [address] [place], [place] [address]   Wet prep, genital     Status: Abnormal   Collection Time: 07/29/20  9:22 AM   Specimen: Cervix  Result Value Ref Range   Yeast Wet Prep HPF POC NONE SEEN NONE SEEN   Trich, Wet Prep NONE SEEN NONE SEEN   Clue Cells Wet Prep HPF POC NONE SEEN NONE SEEN  WBC, Wet Prep HPF POC MANY (A) NONE SEEN   Sperm NONE SEEN   Urine rapid drug screen (hosp performed)     Status: Abnormal   Collection Time: 07/29/20  9:22 AM  Result Value Ref Range   Opiates  NONE DETECTED NONE DETECTED   Cocaine NONE DETECTED NONE DETECTED   Benzodiazepines NONE DETECTED NONE DETECTED   Amphetamines NONE DETECTED NONE DETECTED   Tetrahydrocannabinol POSITIVE (A) NONE DETECTED   Barbiturates NONE DETECTED NONE DETECTED  Urinalysis, Routine w reflex microscopic Urine, Clean Catch     Status: Abnormal   Collection Time: 07/29/20  9:22 AM  Result Value Ref Range   Color, Urine YELLOW YELLOW   APPearance HAZY (A) CLEAR   Specific Gravity, Urine 1.017 1.005 - 1.030   pH 7.0 5.0 - 8.0   Glucose, UA NEGATIVE NEGATIVE mg/dL   Hgb urine dipstick LARGE (A) NEGATIVE   Bilirubin Urine NEGATIVE NEGATIVE   Ketones, ur NEGATIVE NEGATIVE mg/dL   Protein, ur NEGATIVE NEGATIVE mg/dL   Nitrite NEGATIVE NEGATIVE   Leukocytes,Ua MODERATE (A) NEGATIVE   RBC / HPF >50 (H) 0 - 5 RBC/hpf   WBC, UA 21-50 0 - 5 WBC/hpf   Bacteria, UA RARE (A) NONE SEEN   Squamous Epithelial / LPF 0-5 0 - 5   Mucus PRESENT    [place]: no signs of abruption or previa, footling breech  MAU Course  Procedures  MDM Prenatal records in Care Everywhere>pregnancy complicated by anemia, ?GDM, hx of HSV. Last US shows anterior placenta, no previa. Labs and [place] ordered and reviewed. Consult with Dr. Ilda Basset, plan for admit.   Assessment and Plan  [redacted] weeks gestation Vaginal bleeding Rh positive Admit to [facility] unit [facility] per Dr. Barrie Lyme, CNM 07/29/2020, 10:59 AM

## 2020-07-29 NOTE — H&P (Signed)
History   CSN: [id number]  Arrival date and time: 07/29/20 3220     Chief Complaint  Patient presents with  . Vaginal Bleeding   24 y.o. [id number] @30 .6 wks presenting with VB. Reports a large amt into the toilet around 0740 today. Small amt of bleeding since. Endorses intermittent abdominal pain since the bleeding. Unsure if pain is ctx. Denies recent sex. No falls or injury. Reports daily MJ use and vaping. Reports abnormal GTT but did not have 3 hr. Reports [facility] in first trimester.           OB History    Gravida  5   Para  1   Term  1   Preterm      AB  3   Living  1     SAB  2   IAB  1   Ectopic      Multiple      Live Births  1               Past Medical History:  Diagnosis Date  . Anemia   . Anxiety   . Complication of anesthesia   . Depression   . GERD (gastroesophageal reflux disease)   . Herpes genitalia   . Insomnia   . Obesity   . PONV (postoperative nausea and vomiting)   . Seizures ([place])    once in 2016 due to alcohol poisioning  . Suicide [facility])          Past Surgical History:  Procedure Laterality Date  . CHOLECYSTECTOMY  2015  . COSMETIC SURGERY  Age 47   dog bite to face         Family History  Problem Relation Age of Onset  . Alcohol abuse Mother   . Stroke Mother   . Bipolar disorder Mother   . Drug abuse Mother   . Alcohol abuse Father   . Bipolar disorder Father   . Drug abuse Father   . Colon cancer Maternal Grandfather   . Crohn's disease Paternal Grandmother   . Crohn's disease Paternal Aunt   . Crohn's disease Paternal Aunt   . Crohn's disease Paternal Uncle   . Colon cancer Paternal Uncle   . Cancer Maternal Aunt        "stomach cancer"  . Celiac disease Neg Hx     Social History        Tobacco Use  . Smoking status: Former Smoker    Packs/day: 0.50    Years: 5.00    Pack years: 2.50    Types: Cigarettes    Quit date: 11/24/2019    Years  since quitting: 0.6  . Smokeless tobacco: Never Used  . Tobacco comment: one pack cigarettes daily  Vaping Use  . Vaping Use: Some days  Substance Use Topics  . Alcohol use: Not Currently    Alcohol/week: 0.0 standard drinks    Comment: social  . Drug use: Yes    Types: Marijuana    Comment: uses once a day    Allergies: No Known Allergies         Medications Prior to Admission  Medication Sig Dispense Refill Last Dose  . ibuprofen (ADVIL) 600 MG tablet Take 600 mg by mouth every 6 (six) hours as needed.   07/28/2020 at Unknown time  . lamoTRIgine (LAMICTAL) 25 MG tablet Take by mouth daily.   Past Month at Unknown time  . sulfamethoxazole-trimethoprim (BACTRIM DS) 800-160 MG tablet Take 1 tablet by  mouth 2 (two) times daily. 14 tablet 0 Past Month at Unknown time  . valACYclovir (VALTREX) 1000 MG tablet Take 1 tablet (1,000 mg total) by mouth daily. 90 tablet 1 Past Month at Unknown time  . cetirizine (ZYRTEC) 10 MG tablet Take 1 tablet (10 mg total) by mouth daily. 30 tablet 0 More than a month at Unknown time  . fluticasone (FLONASE) 50 MCG/ACT nasal spray Place 2 sprays into both nostrils daily. 16 g 0 More than a month at Unknown time  . hydrOXYzine (VISTARIL) 25 MG capsule Take 1 capsule (25 mg total) by mouth daily as needed for anxiety. 30 capsule 1   . venlafaxine XR (EFFEXOR-XR) 75 MG 24 hr capsule 75 mg daily. Start after 37.5 mg daily for one week 30 capsule 0     Review of Systems  Gastrointestinal: Positive for abdominal pain.  Genitourinary: Positive for vaginal bleeding.   Physical Exam   Blood pressure 117/69, pulse 83, temperature 98.2 F (36.8 C), temperature source Oral, resp. rate 17, height 5\' 5"  (1.651 m), weight 111.1 kg, last menstrual period 12/14/2019, SpO2 99 %, unknown if currently breastfeeding.  Physical Exam Vitals and nursing note reviewed. Exam conducted with a chaperone present.  Constitutional:      General: She is not  in acute distress.    Appearance: Normal appearance.  Cardiovascular:     Rate and Rhythm: Normal rate.  Pulmonary:     Effort: Pulmonary effort is normal. No respiratory distress.  Abdominal:     Palpations: Abdomen is soft.     Tenderness: There is no abdominal tenderness.     Comments: gravid  Genitourinary:    Comments: External: no lesions or erythema Vagina: rugated, pink, moist, moderate thick bloody discharge Cervix closed/thick  Musculoskeletal:        General: Normal range of motion.  Skin:    General: Skin is warm and dry.  Neurological:     General: No focal deficit present.     Mental Status: She is alert and oriented to person, place, and time.  Psychiatric:        Mood and Affect: Mood is anxious.        Behavior: Behavior normal.   EFM: 130 bpm, mod variability, + accels, no decels Toco: none  Lab Results Last 24 Hours        Results for orders placed or performed during the hospital encounter of 07/29/20 (from the past 24 hour(s))  CBC     Status: Abnormal   Collection Time: 07/29/20  9:09 AM  Result Value Ref Range   WBC 10.5 4.0 - 10.5 K/uL   RBC 3.27 (L) 3.87 - 5.11 MIL/uL   Hemoglobin 9.8 (L) 12.0 - 15.0 g/dL   HCT 29.8 (L) 36.0 - 46.0 %   MCV 91.1 80.0 - 100.0 fL   MCH 30.0 26.0 - 34.0 pg   MCHC 32.9 30.0 - 36.0 g/dL   RDW 14.8 11.5 - 15.5 %   Platelets 304 150 - 400 K/uL   nRBC 0.0 0.0 - 0.2 %  Type and screen     Status: None   Collection Time: 07/29/20  9:09 AM  Result Value Ref Range   ABO/RH(D) O POS    Antibody Screen NEG    Sample Expiration      08/01/2020,2359 Performed at [facility], [place] [address], [place], [place] [address]   Wet prep, genital     Status: Abnormal   Collection Time:  07/29/20  9:22 AM   Specimen: Cervix  Result Value Ref Range   Yeast Wet Prep HPF POC NONE SEEN NONE SEEN   Trich, Wet Prep NONE SEEN NONE SEEN   Clue Cells Wet Prep HPF POC NONE SEEN NONE SEEN   WBC, Wet  Prep HPF POC MANY (A) NONE SEEN   Sperm NONE SEEN   Urine rapid drug screen (hosp performed)     Status: Abnormal   Collection Time: 07/29/20  9:22 AM  Result Value Ref Range   Opiates NONE DETECTED NONE DETECTED   Cocaine NONE DETECTED NONE DETECTED   Benzodiazepines NONE DETECTED NONE DETECTED   Amphetamines NONE DETECTED NONE DETECTED   Tetrahydrocannabinol POSITIVE (A) NONE DETECTED   Barbiturates NONE DETECTED NONE DETECTED  Urinalysis, Routine w reflex microscopic Urine, Clean Catch     Status: Abnormal   Collection Time: 07/29/20  9:22 AM  Result Value Ref Range   Color, Urine YELLOW YELLOW   APPearance HAZY (A) CLEAR   Specific Gravity, Urine 1.017 1.005 - 1.030   pH 7.0 5.0 - 8.0   Glucose, UA NEGATIVE NEGATIVE mg/dL   Hgb urine dipstick LARGE (A) NEGATIVE   Bilirubin Urine NEGATIVE NEGATIVE   Ketones, ur NEGATIVE NEGATIVE mg/dL   Protein, ur NEGATIVE NEGATIVE mg/dL   Nitrite NEGATIVE NEGATIVE   Leukocytes,Ua MODERATE (A) NEGATIVE   RBC / HPF >50 (H) 0 - 5 RBC/hpf   WBC, UA 21-50 0 - 5 WBC/hpf   Bacteria, UA RARE (A) NONE SEEN   Squamous Epithelial / LPF 0-5 0 - 5   Mucus PRESENT      [place]: no signs of abruption or previa, footling breech  MAU Course  Procedures  MDM Prenatal records in Care Everywhere>pregnancy complicated by anemia, ?GDM, hx of HSV. Last US shows anterior placenta, no previa. Labs and [place] ordered and reviewed. Consult with Dr. Ilda Basset, plan for admit.   Assessment and Plan  [redacted] weeks gestation Vaginal bleeding Rh positive Admit to [facility] unit [facility] per Dr. Barrie Lyme, CNM 07/29/2020, 10:59 AM    *Admit to Antepartum *VB: ultrasound negative and has slowed significantly per patient and no pain; pt denies any recent sexual intercourse. Given amount of VB, I recommend steroid course, which she is amenable to. Has baseline anemia (see care everywhere). Anemia panel ordered. Okay to eat now and if baby  still doing fine then okay to come off monitor. FHR was picking up maternal when I was in the room talking to the patient. O POS *?GDM: I told her that based on her 1h of 186, body habitus and getting steroids that we will treat her as GDM for now. She states no one at her primary OB has talked to her about it even at her last visit this past week. If CBGs are decently high, I told her it's best to treat her as a GDM moving forward. Will do am fasting, 2h PP checks and DM diet *Dispo: d/w her recommend keeping inpatient for at least 5-7 days  Durene Romans MD Attending Center for [place] [facility] Consult Phone: [phone] (M-F, [phone]) & [phone] (Off hours, weekends, holidays)

## 2020-07-30 DIAGNOSIS — O99323 Drug use complicating pregnancy, third trimester: Secondary | ICD-10-CM

## 2020-07-30 DIAGNOSIS — O99810 Abnormal glucose complicating pregnancy: Secondary | ICD-10-CM

## 2020-07-30 DIAGNOSIS — D649 Anemia, unspecified: Secondary | ICD-10-CM

## 2020-07-30 DIAGNOSIS — O99013 Anemia complicating pregnancy, third trimester: Secondary | ICD-10-CM | POA: Diagnosis not present

## 2020-07-30 DIAGNOSIS — O321XX0 Maternal care for breech presentation, not applicable or unspecified: Secondary | ICD-10-CM

## 2020-07-30 DIAGNOSIS — F1210 Cannabis abuse, uncomplicated: Secondary | ICD-10-CM

## 2020-07-30 DIAGNOSIS — O4693 Antepartum hemorrhage, unspecified, third trimester: Secondary | ICD-10-CM | POA: Diagnosis not present

## 2020-07-30 DIAGNOSIS — Z3A31 31 weeks gestation of pregnancy: Secondary | ICD-10-CM

## 2020-07-30 DIAGNOSIS — L02412 Cutaneous abscess of left axilla: Secondary | ICD-10-CM

## 2020-07-30 LAB — GLUCOSE, CAPILLARY
Glucose-Capillary: 115 mg/dL — ABNORMAL HIGH (ref 70–99)
Glucose-Capillary: 109 mg/dL — ABNORMAL HIGH (ref 70–99)
Glucose-Capillary: 114 mg/dL — ABNORMAL HIGH (ref 70–99)
Glucose-Capillary: 160 mg/dL — ABNORMAL HIGH (ref 70–99)

## 2020-07-30 NOTE — Progress Notes (Signed)
[facility] NOTE  Colleen Valentine is a 25 y.o. [id number] at [redacted]w[redacted]d who is admitted for active labor, vaginal bleeding.  Estimated Date of Delivery: 10/01/20 Fetal presentation is breech.  Length of Stay:  1 Days. Admitted 07/29/2020  Subjective: Pt seen this morning.  She notes fetal movement, mild cramping and occasional pinkish or dark red spotting.  She denies abdominal pain or loss of fluid. Review of chart did note very small, 7 mm, subchorionic hemorrhage which was not mentioned on later scans.  No retroplacental bleeding noted on recent scan at this facility either.   Vitals:  Blood pressure (!) 104/54, pulse 81, temperature 97.6 F (36.4 C), temperature source Axillary, resp. rate 16, height 5\' 5"  (1.651 m), weight 111.1 kg, last menstrual period 12/14/2019, SpO2 99 %, unknown if currently breastfeeding. Physical Examination: CONSTITUTIONAL: Well-developed, well-nourished female in no acute distress.  HENT:  Normocephalic, atraumatic, External right and left ear normal. Oropharynx is clear and moist EYES: Conjunctivae and EOM are normal. Pupils are equal, round, and reactive to light. No scleral icterus.  NECK: Normal range of motion, supple, no masses. SKIN: Skin is warm and dry. No rash noted. Not diaphoretic. No erythema. No pallor. [place]ilda: Alert and oriented to person, place, and time. Normal reflexes, muscle tone coordination. No cranial nerve deficit noted. PSYCHIATRIC: Normal mood and affect. Normal behavior. Normal judgment and thought content. CARDIOVASCULAR: Normal heart rate noted, regular rhythm RESPIRATORY: Effort and breath sounds normal, no problems with respiration noted MUSCULOSKELETAL: Normal range of motion. No edema and no tenderness. ABDOMEN: Soft, nontender, nondistended, gravid. CERVIX: deferred  Fetal monitoring: FHR: 138 bpm, Variability: moderate, Accelerations: Present, Decelerations: Absent , category 1 strip Uterine activity:  irritability, mild   Results for orders placed or performed during the hospital encounter of 07/29/20 (from the past 48 hour(s))  CBC     Status: Abnormal   Collection Time: 07/29/20  9:09 AM  Result Value Ref Range   WBC 10.5 4.0 - 10.5 K/uL   RBC 3.27 (L) 3.87 - 5.11 MIL/uL   Hemoglobin 9.8 (L) 12.0 - 15.0 g/dL   HCT 29.8 (L) 36.0 - 46.0 %   MCV 91.1 80.0 - 100.0 fL   MCH 30.0 26.0 - 34.0 pg   MCHC 32.9 30.0 - 36.0 g/dL   RDW 14.8 11.5 - 15.5 %   Platelets 304 150 - 400 K/uL   nRBC 0.0 0.0 - 0.2 %    Comment: Performed at [facility], [place] [address], [place], [place] [address]  Type and screen     Status: None   Collection Time: 07/29/20  9:09 AM  Result Value Ref Range   ABO/RH(D) O POS    Antibody Screen NEG    Sample Expiration      08/01/2020,2359 Performed at [facility], [place] [address], [place], [place] [address]   Vitamin B12     Status: Abnormal   Collection Time: 07/29/20  9:09 AM  Result Value Ref Range   Vitamin B-12 119 (L) 180 - 914 pg/mL    Comment: (NOTE) This assay is not validated for testing neonatal or myeloproliferative syndrome specimens for Vitamin B12 levels. Performed at [facility], [place] [address], [place], [place] [address]   Iron and TIBC     Status: Abnormal   Collection Time: 07/29/20  9:09 AM  Result Value Ref Range   Iron 89 28 - 170 ug/dL   TIBC 493 (H) 250 - 450  ug/dL   Saturation Ratios 18 10.4 - 31.8 %   UIBC 404 ug/dL    Comment: Performed at [facility], [place] [address] [place], [place] [address]  Ferritin     Status: None   Collection Time: 07/29/20  9:09 AM  Result Value Ref Range   Ferritin 31 11 - 307 ng/mL    Comment: Performed at [facility], [place] [address], [place], [place] [address]  Reticulocytes     Status: Abnormal   Collection Time: 07/29/20  9:09 AM  Result Value Ref Range   Retic Ct Pct 4.5 (H) 0.4 - 3.1 %   RBC. 3.21 (L) 3.87 - 5.11 MIL/uL   Retic Count, Absolute 145.7 19.0 -  186.0 K/uL   Immature Retic Fract 29.8 (H) 2.3 - 15.9 %    Comment: Performed at [place] [address], [place], [place] [address]  Folate     Status: Abnormal   Collection Time: 07/29/20  9:09 AM  Result Value Ref Range   Folate 4.0 (L) >5.9 ng/mL    Comment: Performed at [facility], [place] [address], [place], [place] [address]  Wet prep, genital     Status: Abnormal   Collection Time: 07/29/20  9:22 AM   Specimen: Cervix  Result Value Ref Range   Yeast Wet Prep HPF POC NONE SEEN NONE SEEN   Trich, Wet Prep NONE SEEN NONE SEEN   Clue Cells Wet Prep HPF POC NONE SEEN NONE SEEN   WBC, Wet Prep HPF POC MANY (A) NONE SEEN   Sperm NONE SEEN     Comment: Specimen diluted due to transport tube containing more than 1 ml of saline, interpret results with caution. Performed at [facility], [place] [address] [place], [place] [address]   Urine rapid drug screen (hosp performed)     Status: Abnormal   Collection Time: 07/29/20  9:22 AM  Result Value Ref Range   Opiates NONE DETECTED NONE DETECTED   Cocaine NONE DETECTED NONE DETECTED   Benzodiazepines NONE DETECTED NONE DETECTED   Amphetamines NONE DETECTED NONE DETECTED   Tetrahydrocannabinol POSITIVE (A) NONE DETECTED   Barbiturates NONE DETECTED NONE DETECTED    Comment: (NOTE) DRUG SCREEN FOR MEDICAL PURPOSES ONLY.  IF CONFIRMATION IS NEEDED FOR ANY PURPOSE, NOTIFY LAB WITHIN 5 DAYS.  LOWEST DETECTABLE LIMITS FOR URINE DRUG SCREEN Drug Class                     Cutoff (ng/mL) Amphetamine and metabolites    1000 Barbiturate and metabolites    200 Benzodiazepine                 093 Tricyclics and metabolites     300 Opiates and metabolites        300 Cocaine and metabolites        300 THC                            50 Performed at [facility], [place] [address], [place],  [address]   Urinalysis, Routine w reflex microscopic Urine, Clean Catch     Status: Abnormal   Collection Time: 07/29/20   9:22 AM  Result Value Ref Range   Color, Urine YELLOW YELLOW   APPearance HAZY (A) CLEAR   Specific Gravity, Urine 1.017 1.005 - 1.030   pH 7.0 5.0 - 8.0  Glucose, UA NEGATIVE NEGATIVE mg/dL   Hgb urine dipstick LARGE (A) NEGATIVE   Bilirubin Urine NEGATIVE NEGATIVE   Ketones, ur NEGATIVE NEGATIVE mg/dL   Protein, ur NEGATIVE NEGATIVE mg/dL   Nitrite NEGATIVE NEGATIVE   Leukocytes,Ua MODERATE (A) NEGATIVE   RBC / HPF >50 (H) 0 - 5 RBC/hpf   WBC, UA 21-50 0 - 5 WBC/hpf   Bacteria, UA RARE (A) NONE SEEN   Squamous Epithelial / LPF 0-5 0 - 5   Mucus PRESENT     Comment: Performed at [facility], [place] [address], [place], [place] [address]  Resp Panel by RT-PCR (Flu A&B, Covid) Nasopharyngeal Swab     Status: None   Collection Time: 07/29/20 10:54 AM   Specimen: Nasopharyngeal Swab; Nasopharyngeal(NP) swabs in vial transport medium  Result Value Ref Range   SARS Coronavirus 2 by RT PCR NEGATIVE NEGATIVE    Comment: (NOTE) SARS-CoV-2 target nucleic acids are NOT DETECTED.  The SARS-CoV-2 RNA is generally detectable in upper respiratory specimens during the acute phase of infection. The lowest concentration of SARS-CoV-2 viral copies this assay can detect is 138 copies/mL. A negative result does not preclude SARS-Cov-2 infection and should not be used as the sole basis for treatment or other patient management decisions. A negative result may occur with  improper specimen collection/handling, submission of specimen other than nasopharyngeal swab, presence of viral mutation(s) within the areas targeted by this assay, and inadequate number of viral copies(<138 copies/mL). A negative result must be combined with clinical observations, patient history, and epidemiological information. The expected result is Negative.  Fact Sheet for Patients:  EntrepreneurPulse.com.au  Fact Sheet for Healthcare Providers:  IncredibleEmployment.be  This  test is no t yet approved or cleared by the [place] FDA and  has been authorized for detection and/or diagnosis of SARS-CoV-2 by FDA under an Emergency Use Authorization (EUA). This EUA will remain  in effect (meaning this test can be used) for the duration of the COVID-19 declaration under Section 564(b)(1) of the Act, 21 U.S.C.section 360bbb-3(b)(1), unless the authorization is terminated  or revoked sooner.       Influenza A by PCR NEGATIVE NEGATIVE   Influenza B by PCR NEGATIVE NEGATIVE    Comment: (NOTE) The Xpert Xpress SARS-CoV-2/FLU/RSV plus assay is intended as an aid in the diagnosis of influenza from Nasopharyngeal swab specimens and should not be used as a sole basis for treatment. Nasal washings and aspirates are unacceptable for Xpert Xpress SARS-CoV-2/FLU/RSV testing.  Fact Sheet for Patients: EntrepreneurPulse.com.au  Fact Sheet for Healthcare Providers: IncredibleEmployment.be  This test is not yet approved or cleared by the [place] FDA and has been authorized for detection and/or diagnosis of SARS-CoV-2 by FDA under an Emergency Use Authorization (EUA). This EUA will remain in effect (meaning this test can be used) for the duration of the COVID-19 declaration under Section 564(b)(1) of the Act, 21 U.S.C. section 360bbb-3(b)(1), unless the authorization is terminated or revoked.  Performed at [facility], [place] [address] [place], [place] [address]   Glucose, capillary     Status: Abnormal   Collection Time: 07/29/20  6:16 PM  Result Value Ref Range   Glucose-Capillary 132 (H) 70 - 99 mg/dL    Comment: Glucose reference range applies only to samples taken after fasting for at least 8 hours.  Glucose, capillary     Status: Abnormal   Collection Time: 07/29/20  9:30 PM  Result Value Ref Range   Glucose-Capillary  124 (H) 70 - 99 mg/dL    Comment: Glucose reference range applies only to samples taken after  fasting for at least 8 hours.  Glucose, capillary     Status: Abnormal   Collection Time: 07/30/20  6:12 AM  Result Value Ref Range   Glucose-Capillary 115 (H) 70 - 99 mg/dL    Comment: Glucose reference range applies only to samples taken after fasting for at least 8 hours.  Glucose, capillary     Status: Abnormal   Collection Time: 07/30/20 10:48 AM  Result Value Ref Range   Glucose-Capillary 109 (H) 70 - 99 mg/dL    Comment: Glucose reference range applies only to samples taken after fasting for at least 8 hours.    I have reviewed the patient's current medications.  ASSESSMENT: Active Problems:   Vaginal bleeding during pregnancy   Marijuana abuse   Abscess of left axilla   Anemia in pregnancy   Abnormal glucose affecting pregnancy   PLAN: Complete BMZ course Reassuring fetal testing Monitor blood sugars secondary to steroids. Continue inpatient monitoring until Wednesday or Thursday   Continue routine antenatal care.   Lynnda Shields, MD [place] Attending, Center for [facility] 07/30/2020 12:07 PM

## 2020-07-31 DIAGNOSIS — O24415 Gestational diabetes mellitus in pregnancy, controlled by oral hypoglycemic drugs: Secondary | ICD-10-CM

## 2020-07-31 DIAGNOSIS — Z3A31 31 weeks gestation of pregnancy: Secondary | ICD-10-CM

## 2020-07-31 LAB — GC/CHLAMYDIA PROBE AMP (~~LOC~~) NOT AT ARMC
Chlamydia: NEGATIVE
Comment: NEGATIVE
Comment: NORMAL
Neisseria Gonorrhea: NEGATIVE

## 2020-07-31 LAB — GLUCOSE, CAPILLARY
Glucose-Capillary: 126 mg/dL — ABNORMAL HIGH (ref 70–99)
Glucose-Capillary: 117 mg/dL — ABNORMAL HIGH (ref 70–99)
Glucose-Capillary: 122 mg/dL — ABNORMAL HIGH (ref 70–99)
Glucose-Capillary: 125 mg/dL — ABNORMAL HIGH (ref 70–99)

## 2020-07-31 MED ORDER — METFORMIN HCL 500 MG PO TABS
500.0000 mg | ORAL_TABLET | Freq: Every day | ORAL | Status: DC
  Administered 2020-07-31 – 2020-08-02 (×3): 500 mg via ORAL
  Filled 2020-07-31 (×3): qty 1

## 2020-07-31 NOTE — Progress Notes (Signed)
[place] NOTE  Soul Deveney is a 25 y.o. [id number] at [redacted]w[redacted]d who is admitted for vaginal bleeding.  Estimated Date of Delivery: 10/01/20 Fetal presentation is breech.  Length of Stay:  2 Days. Admitted 07/29/2020  Subjective: Patient denies any bleeding since admission on 07/29/2020. Reports no contractions or LOF. Good fetal movement.  Vitals:  Blood pressure 119/73, pulse 72, temperature (!) 97.5 F (36.4 C), temperature source Oral, resp. rate 16, height 5\' 5"  (1.651 m), weight 111.1 kg, last menstrual period 12/14/2019, SpO2 100 %, unknown if currently breastfeeding. Physical Examination: CONSTITUTIONAL: Well-developed, well-nourished female in no acute distress.  SKIN: Skin is warm and dry. No rash noted. Not diaphoretic. No erythema. No pallor. [place]: Alert and oriented to person, place, and time. Normal reflexes, muscle tone coordination. No cranial nerve deficit noted. PSYCHIATRIC: Normal mood and affect. Normal behavior. Normal judgment and thought content. CARDIOVASCULAR: Normal heart rate noted, regular rhythm RESPIRATORY: Effort and breath sounds normal, no problems with respiration noted MUSCULOSKELETAL: Normal range of motion. No edema and no tenderness. ABDOMEN: Soft, nontender, nondistended, gravid. CERVIX: deferred  Fetal monitoring: FHR: 145 bpm, Variability: moderate, Accelerations: Present, Decelerations: mild variable decels Uterine activity: none  Results for orders placed or performed during the hospital encounter of 07/29/20 (from the past 48 hour(s))  Resp Panel by RT-PCR (Flu A&B, Covid) Nasopharyngeal Swab     Status: None   Collection Time: 07/29/20 10:54 AM   Specimen: Nasopharyngeal Swab; Nasopharyngeal(NP) swabs in vial transport medium  Result Value Ref Range   SARS Coronavirus 2 by RT PCR NEGATIVE NEGATIVE    Comment: (NOTE) SARS-CoV-2 target nucleic acids are NOT DETECTED.  The SARS-CoV-2 RNA is generally detectable in upper  respiratory specimens during the acute phase of infection. The lowest concentration of SARS-CoV-2 viral copies this assay can detect is 138 copies/mL. A negative result does not preclude SARS-Cov-2 infection and should not be used as the sole basis for treatment or other patient management decisions. A negative result may occur with  improper specimen collection/handling, submission of specimen other than nasopharyngeal swab, presence of viral mutation(s) within the areas targeted by this assay, and inadequate number of viral copies(<138 copies/mL). A negative result must be combined with clinical observations, patient history, and epidemiological information. The expected result is Negative.  Fact Sheet for Patients:  EntrepreneurPulse.com.au  Fact Sheet for Healthcare Providers:  IncredibleEmployment.be  This test is no t yet approved or cleared by the [place] FDA and  has been authorized for detection and/or diagnosis of SARS-CoV-2 by FDA under an Emergency Use Authorization (EUA). This EUA will remain  in effect (meaning this test can be used) for the duration of the COVID-19 declaration under Section 564(b)(1) of the Act, 21 U.S.C.section 360bbb-3(b)(1), unless the authorization is terminated  or revoked sooner.       Influenza A by PCR NEGATIVE NEGATIVE   Influenza B by PCR NEGATIVE NEGATIVE    Comment: (NOTE) The Xpert Xpress SARS-CoV-2/FLU/RSV plus assay is intended as an aid in the diagnosis of influenza from Nasopharyngeal swab specimens and should not be used as a sole basis for treatment. Nasal washings and aspirates are unacceptable for Xpert Xpress SARS-CoV-2/FLU/RSV testing.  Fact Sheet for Patients: EntrepreneurPulse.com.au  Fact Sheet for Healthcare Providers: IncredibleEmployment.be  This test is not yet approved or cleared by the [place] FDA and has been authorized for  detection and/or diagnosis of SARS-CoV-2 by FDA under an Emergency Use Authorization (EUA). This EUA will remain in  effect (meaning this test can be used) for the duration of the COVID-19 declaration under Section 564(b)(1) of the Act, 21 U.S.C. section 360bbb-3(b)(1), unless the authorization is terminated or revoked.  Performed at [facility], [place] [address], [place], [place] [address]   Glucose, capillary     Status: Abnormal   Collection Time: 07/29/20  6:16 PM  Result Value Ref Range   Glucose-Capillary 132 (H) 70 - 99 mg/dL    Comment: Glucose reference range applies only to samples taken after fasting for at least 8 hours.  Glucose, capillary     Status: Abnormal   Collection Time: 07/29/20  9:30 PM  Result Value Ref Range   Glucose-Capillary 124 (H) 70 - 99 mg/dL    Comment: Glucose reference range applies only to samples taken after fasting for at least 8 hours.  Glucose, capillary     Status: Abnormal   Collection Time: 07/30/20  6:12 AM  Result Value Ref Range   Glucose-Capillary 115 (H) 70 - 99 mg/dL    Comment: Glucose reference range applies only to samples taken after fasting for at least 8 hours.  Glucose, capillary     Status: Abnormal   Collection Time: 07/30/20 10:48 AM  Result Value Ref Range   Glucose-Capillary 109 (H) 70 - 99 mg/dL    Comment: Glucose reference range applies only to samples taken after fasting for at least 8 hours.  Glucose, capillary     Status: Abnormal   Collection Time: 07/30/20  2:42 PM  Result Value Ref Range   Glucose-Capillary 114 (H) 70 - 99 mg/dL    Comment: Glucose reference range applies only to samples taken after fasting for at least 8 hours.  Glucose, capillary     Status: Abnormal   Collection Time: 07/30/20 10:10 PM  Result Value Ref Range   Glucose-Capillary 160 (H) 70 - 99 mg/dL    Comment: Glucose reference range applies only to samples taken after fasting for at least 8 hours.  Glucose, capillary     Status:  Abnormal   Collection Time: 07/31/20  5:14 AM  Result Value Ref Range   Glucose-Capillary 126 (H) 70 - 99 mg/dL    Comment: Glucose reference range applies only to samples taken after fasting for at least 8 hours.    I have reviewed the patient's current medications.  ASSESSMENT: Principal Problem:   Vaginal bleeding in pregnancy, third trimester Active Problems:   Morbid obesity with BMI of 40.0-44.9, adult (HCC)   Marijuana abuse   Abscess of left axilla   Anemia in pregnancy   Gestational diabetes mellitus in third trimester   [redacted] weeks gestation of pregnancy   PLAN: Completed BMZ course, this is also causing elevated blood sugars especially with fasting values.  Will start qhs Metformin for control and titrate as needed. Reassuring fetal testing Continue inpatient monitoring until 3/10 if no further bleeding Patient desires transfer to [facility], will help coordinate this. Continue routine antenatal care.   Verita Schneiders, MD, [place] for [employer], [place]

## 2020-07-31 NOTE — Progress Notes (Signed)
Inpatient Diabetes Program Recommendations  Diabetes Treatment Program Recommendations  ADA Standards of Care 2018 Diabetes in Pregnancy Target Glucose Ranges:  Fasting: 60 - 90 mg/dL Preprandial: 60 - 105 mg/dL 1 hr postprandial: Less than 140mg /dL (from first bite of meal) 2 hr postprandial: Less than 120 mg/dL (from first bite of meal)    Lab Results  Component Value Date   GLUCAP 126 (H) 07/31/2020   HGBA1C 5.0 07/21/2012    Review of Glycemic Control Results for Colleen Valentine, Colleen Valentine (MRN [id number]) as of 07/31/2020 09:07  Ref. Range 07/30/2020 10:48 07/30/2020 14:42 07/30/2020 22:10 07/31/2020 05:14  Glucose-Capillary Latest Ref Range: 70 - 99 mg/dL 109 (H) 114 (H) 160 (H) 126 (H)   Diabetes history: GDM Outpatient Diabetes medications: none Current orders for Inpatient glycemic control: none BMZ x 2  Inpatient Diabetes Program Recommendations:    Glucose mild elevated assuming due to BMZ administration. If glucose continues to exceed consistently >120 mg/dL, could consider adding Novolog 0-14 units TID (under Diabetes treatment in pregnancy order set).   Thanks, Bronson Curb, MSN, RNC-OB Diabetes Coordinator [phone] (8a-5p)

## 2020-08-01 LAB — GLUCOSE, CAPILLARY
Glucose-Capillary: 98 mg/dL (ref 70–99)
Glucose-Capillary: 102 mg/dL — ABNORMAL HIGH (ref 70–99)
Glucose-Capillary: 72 mg/dL (ref 70–99)
Glucose-Capillary: 135 mg/dL — ABNORMAL HIGH (ref 70–99)

## 2020-08-01 MED ORDER — LACTATED RINGERS IV BOLUS
1000.0000 mL | Freq: Once | INTRAVENOUS | Status: AC
  Administered 2020-08-01: 1000 mL via INTRAVENOUS

## 2020-08-01 MED ORDER — FENTANYL CITRATE (PF) 100 MCG/2ML IJ SOLN
100.0000 ug | Freq: Once | INTRAMUSCULAR | Status: AC
  Administered 2020-08-01: 100 ug via INTRAVENOUS
  Filled 2020-08-01: qty 2

## 2020-08-01 MED ORDER — ONDANSETRON HCL 4 MG/2ML IJ SOLN
4.0000 mg | Freq: Once | INTRAMUSCULAR | Status: AC
  Administered 2020-08-01: 4 mg via INTRAVENOUS
  Filled 2020-08-01: qty 2

## 2020-08-01 NOTE — Progress Notes (Signed)
RN called to pt room. Pt was on the toilet, stating that she was feeling intense back pain, vaginal pressure, was breathing through ctx, and was feeling nauseated. Pt also had small amount of bloody show (bright red to pink-tinged) on toilet paper. Notified Dr. Harolyn Rutherford of situation, but was in the OR. Was told to contact an MAU provider. Notified Jorje Guild, NP. NP en route to department to assess pt.

## 2020-08-01 NOTE — Progress Notes (Signed)
I was called to the room at ~1640 due to patient's complaint of contractions. Abdomen soft & non tender. Cervix closed/t50%/-3. No blood on glove.   Monitors applied. NST:  Baseline: 145 bpm, Variability: Good {> 6 bpm), Accelerations: Reactive, Decelerations: Absent and ctx x 1  Will give IV fluid bolus & fentanyl 100 mcg to treat symptoms.  Dr. Harolyn Rutherford updated regarding patient's status & plan.    Jorje Guild, NP

## 2020-08-01 NOTE — Progress Notes (Signed)
[place] NOTE  Colleen Valentine is a 25 y.o. [id number] at [redacted]w[redacted]d who is admitted for vaginal bleeding.  Estimated Date of Delivery: 10/01/20 Fetal presentation is breech.  Length of Stay:  3 Days. Admitted 07/29/2020  Subjective: Patient denies any further bleeding since admission on 07/29/2020. Reports no contractions or LOF. Good fetal movement.  Vitals:  Blood pressure 125/69, pulse 76, temperature 97.9 F (36.6 C), temperature source Axillary, resp. rate 17, height 5\' 5"  (1.651 m), weight 111.1 kg, last menstrual period 12/14/2019, SpO2 100 %, unknown if currently breastfeeding. Physical Examination: CONSTITUTIONAL: Well-developed, well-nourished female in no acute distress.  SKIN: Skin is warm and dry. No rash noted. Not diaphoretic. No erythema. No pallor. [place]: Alert and oriented to person, place, and time. Normal reflexes, muscle tone coordination. No cranial nerve deficit noted. PSYCHIATRIC: Normal mood and affect. Normal behavior. Normal judgment and thought content. CARDIOVASCULAR: Normal heart rate noted, regular rhythm RESPIRATORY: Effort and breath sounds normal, no problems with respiration noted MUSCULOSKELETAL: Normal range of motion. No edema and no tenderness. ABDOMEN: Soft, nontender, nondistended, gravid. CERVIX: deferred  Fetal monitoring: FHR: 135 bpm, Variability: moderate, Accelerations: Present, Decelerations: mild variable decels Uterine activity: none  Results for orders placed or performed during the hospital encounter of 07/29/20 (from the past 48 hour(s))  Glucose, capillary     Status: Abnormal   Collection Time: 07/30/20 10:48 AM  Result Value Ref Range   Glucose-Capillary 109 (H) 70 - 99 mg/dL    Comment: Glucose reference range applies only to samples taken after fasting for at least 8 hours.  Glucose, capillary     Status: Abnormal   Collection Time: 07/30/20  2:42 PM  Result Value Ref Range   Glucose-Capillary 114 (H) 70 - 99  mg/dL    Comment: Glucose reference range applies only to samples taken after fasting for at least 8 hours.  Glucose, capillary     Status: Abnormal   Collection Time: 07/30/20 10:10 PM  Result Value Ref Range   Glucose-Capillary 160 (H) 70 - 99 mg/dL    Comment: Glucose reference range applies only to samples taken after fasting for at least 8 hours.  Glucose, capillary     Status: Abnormal   Collection Time: 07/31/20  5:14 AM  Result Value Ref Range   Glucose-Capillary 126 (H) 70 - 99 mg/dL    Comment: Glucose reference range applies only to samples taken after fasting for at least 8 hours.  Glucose, capillary     Status: Abnormal   Collection Time: 07/31/20 12:16 PM  Result Value Ref Range   Glucose-Capillary 117 (H) 70 - 99 mg/dL    Comment: Glucose reference range applies only to samples taken after fasting for at least 8 hours.  Glucose, capillary     Status: Abnormal   Collection Time: 07/31/20  3:37 PM  Result Value Ref Range   Glucose-Capillary 122 (H) 70 - 99 mg/dL    Comment: Glucose reference range applies only to samples taken after fasting for at least 8 hours.  Glucose, capillary     Status: Abnormal   Collection Time: 07/31/20  9:20 PM  Result Value Ref Range   Glucose-Capillary 125 (H) 70 - 99 mg/dL    Comment: Glucose reference range applies only to samples taken after fasting for at least 8 hours.  Glucose, capillary     Status: None   Collection Time: 08/01/20  6:16 AM  Result Value Ref Range   Glucose-Capillary 98 70 -  99 mg/dL    Comment: Glucose reference range applies only to samples taken after fasting for at least 8 hours.    I have reviewed the patient's current medications.  ASSESSMENT: Principal Problem:   Vaginal bleeding in pregnancy, third trimester Active Problems:   Morbid obesity with BMI of 40.0-44.9, adult (HCC)   Marijuana abuse   Abscess of left axilla   Anemia in pregnancy   Gestational diabetes mellitus in third trimester   [redacted]  weeks gestation of pregnancy   PLAN: Improved fasting blood sugar on Metformin 500 mg po qhs, will continue for now and titrate as needed. Reassuring fetal testing, Category I Continue inpatient monitoring until 3/10 if no further bleeding Patient desires transfer to [facility], message sent to office to coordinate this. Continue routine antenatal care.   Verita Schneiders, MD, [place] for [employer], [place]

## 2020-08-01 NOTE — Progress Notes (Signed)
Inpatient Diabetes Program Recommendations  ADA Standards of Care 2022 Diabetes in Pregnancy Target Glucose Ranges:  Fasting: 60 - 90 mg/dL Preprandial: 60 - 105 mg/dL 1 hr postprandial: Less than 175m/dL (from first bite of meal) 2 hr postprandial: Less than 120 mg/dL (from first bit of meal)   Lab Results  Component Value Date   GLUCAP 72 08/01/2020   HGBA1C 5.0 07/21/2012    Review of Glycemic Control Results for [place], COMMON(MRN [id number] as of 08/01/2020 16:14  Ref. Range 07/31/2020 05:14 07/31/2020 12:16 07/31/2020 15:37 07/31/2020 21:20 08/01/2020 06:16 08/01/2020 11:33 08/01/2020 15:22  Glucose-Capillary Latest Ref Range: 70 - 99 mg/dL 126 (H) 117 (H) 122 (H) 125 (H) 98 102 (H) 72   Diabetes history: GDM Outpatient Diabetes medications: none Current orders for Inpatient glycemic control:  Metformin 500 mg Daily  BMZ x 2  Inpatient Diabetes Program Recommendations:    Glucose trends better today.   Consult for education and GDM. Will see pt on 3/9. Attached GDM information to AVS. Pt has insurance. Will give pt educational materials tomorrow.  D/C: Glucose meter kit order # [id number] Thanks,  STama HeadingsRN, MSN, BC-ADM Inpatient Diabetes Coordinator Team Pager [phone](8a-5p)

## 2020-08-02 LAB — CBC
HCT: 31.8 % — ABNORMAL LOW (ref 36.0–46.0)
Hemoglobin: 10.3 g/dL — ABNORMAL LOW (ref 12.0–15.0)
MCH: 29.8 pg (ref 26.0–34.0)
MCHC: 32.4 g/dL (ref 30.0–36.0)
MCV: 91.9 fL (ref 80.0–100.0)
Platelets: 274 10*3/uL (ref 150–400)
RBC: 3.46 MIL/uL — ABNORMAL LOW (ref 3.87–5.11)
RDW: 15.5 % (ref 11.5–15.5)
WBC: 9.3 10*3/uL (ref 4.0–10.5)
nRBC: 0 % (ref 0.0–0.2)

## 2020-08-02 LAB — COMPREHENSIVE METABOLIC PANEL
ALT: 14 U/L (ref 0–44)
AST: 13 U/L — ABNORMAL LOW (ref 15–41)
Albumin: 2.9 g/dL — ABNORMAL LOW (ref 3.5–5.0)
Alkaline Phosphatase: 97 U/L (ref 38–126)
Anion gap: 12 (ref 5–15)
BUN: 9 mg/dL (ref 6–20)
CO2: 22 mmol/L (ref 22–32)
Calcium: 9.3 mg/dL (ref 8.9–10.3)
Chloride: 104 mmol/L (ref 98–111)
Creatinine, Ser: 0.58 mg/dL (ref 0.44–1.00)
GFR, Estimated: >60 mL/min (ref >60)
Glucose, Bld: 96 mg/dL (ref 70–99)
Potassium: 3.8 mmol/L (ref 3.5–5.1)
Sodium: 138 mmol/L (ref 135–145)
Total Bilirubin: 0.5 mg/dL (ref 0.3–1.2)
Total Protein: 6.4 g/dL — ABNORMAL LOW (ref 6.5–8.1)

## 2020-08-02 LAB — TYPE AND SCREEN
ABO/RH(D): O POS
Antibody Screen: NEGATIVE

## 2020-08-02 LAB — GLUCOSE, CAPILLARY
Glucose-Capillary: 96 mg/dL (ref 70–99)
Glucose-Capillary: 109 mg/dL — ABNORMAL HIGH (ref 70–99)
Glucose-Capillary: 91 mg/dL (ref 70–99)

## 2020-08-02 NOTE — Progress Notes (Addendum)
Inpatient Diabetes Program Recommendations  ADA Standards of Care 2022 Diabetes in Pregnancy Target Glucose Ranges:  Fasting: 60 - 90 mg/dL Preprandial: 60 - 105 mg/dL 1 hr postprandial: Less than 140mg /dL (from first bite of meal) 2 hr postprandial: Less than 120 mg/dL (from first bit of meal)   Lab Results  Component Value Date   GLUCAP 96 08/02/2020   HGBA1C 5.0 07/21/2012    Went to see pt in room to discuss GDM and care for home. Pt would like more time to sleep and requested I come this afternoon for education.  Thanks,  Tama Headings RN, MSN, BC-ADM Inpatient Diabetes Coordinator Team Pager [phone] (8a-5p)

## 2020-08-02 NOTE — Progress Notes (Signed)
Inpatient Diabetes Program Recommendations  AACE/ADA: New Consensus Statement on Inpatient Glycemic Control (2015)  Target Ranges:  Prepandial:   less than 140 mg/dL      Peak postprandial:   less than 180 mg/dL (1-2 hours)      Critically ill patients:  140 - 180 mg/dL   Lab Results  Component Value Date   GLUCAP 96 08/02/2020   HGBA1C 5.0 07/21/2012    Review of Glycemic Control  Diabetes history: GDM Outpatient Diabetes medications: none Current orders for Inpatient glycemic control: metformin 500 mg Daily  Spoke with patient about gestational diabetes and insulin. Provided patient with booklet on Managing Gestational Diabetes. Reviewed booklet and discussed importance of DM control during pregnancy. Discussed basic pathophysiology of GDM and reviewed glucose goals (fasting and 2 hour post prandial).  Discussed potential complications for patient and her baby if DM is not well controlled during pregnancy. Discussed diet, exercise, and medication therapy to improve GDM control. Asked patient to check her glucose 4 times per day (fasting, 2 hour post prandial) and to keep a log book of glucose readings and insulin taken. Explained how the doctor she follows up with can use the log book to continue to make insulin adjustments if needed. Informed patient that during labor and delivery, glucose fluctuations will likely change and following delivery, glucose will most likely normalize. Discussed increased risk of patient developing DM2 in the future and encouraged patient to try to get down to ideal body weight and continue following lifestyle modifications after delivery. Patient verbalized understanding of information and she appears to be eager to learn.   Thanks, Tama Headings RN, MSN, BC-ADM Inpatient Diabetes Coordinator Team Pager [phone] (8a-5p)

## 2020-08-02 NOTE — Progress Notes (Signed)
[place] NOTE  Richell Corker is a 25 y.o. [id number] at [redacted]w[redacted]d who is admitted for vaginal bleeding.  Estimated Date of Delivery: 10/01/20 Fetal presentation is breech.  Length of Stay:  4 Days. Admitted 07/29/2020  Subjective: Patient had episode of some bright red blood seen yesterday and today when she wipes and pelvic pressure yesterday.  Closed cervix on exam. Had some contractions yesterday, none right now. Denies LOF. Good fetal movement.  Vitals:  Blood pressure 104/61, pulse 68, temperature 98.1 F (36.7 C), temperature source Oral, resp. rate 18, height 5\' 5"  (1.651 m), weight 111.1 kg, last menstrual period 12/14/2019, SpO2 99 %, unknown if currently breastfeeding. Physical Examination: CONSTITUTIONAL: No acute distress.  SKIN: Skin is warm and dry. No rash noted. Not diaphoretic. No erythema. No pallor. [place]: Alert and oriented to person, place, and time. Normal reflexes, muscle tone coordination. No cranial nerve deficit noted. PSYCHIATRIC: Normal mood and affect. Normal behavior. Normal judgment and thought content. CARDIOVASCULAR: Normal heart rate noted, regular rhythm RESPIRATORY: Effort and breath sounds normal, no problems with respiration noted MUSCULOSKELETAL: Normal range of motion. No edema and no tenderness. ABDOMEN: Soft, nontender, nondistended, gravid. CERVIX: deferred  Fetal monitoring: FHR: 140 bpm, Variability: moderate, Accelerations: Present, Decelerations: mild variable decels Uterine activity: none  Results for orders placed or performed during the hospital encounter of 07/29/20 (from the past 48 hour(s))  Glucose, capillary     Status: Abnormal   Collection Time: 07/31/20 12:16 PM  Result Value Ref Range   Glucose-Capillary 117 (H) 70 - 99 mg/dL    Comment: Glucose reference range applies only to samples taken after fasting for at least 8 hours.  Glucose, capillary     Status: Abnormal   Collection Time: 07/31/20  3:37 PM   Result Value Ref Range   Glucose-Capillary 122 (H) 70 - 99 mg/dL    Comment: Glucose reference range applies only to samples taken after fasting for at least 8 hours.  Glucose, capillary     Status: Abnormal   Collection Time: 07/31/20  9:20 PM  Result Value Ref Range   Glucose-Capillary 125 (H) 70 - 99 mg/dL    Comment: Glucose reference range applies only to samples taken after fasting for at least 8 hours.  Glucose, capillary     Status: None   Collection Time: 08/01/20  6:16 AM  Result Value Ref Range   Glucose-Capillary 98 70 - 99 mg/dL    Comment: Glucose reference range applies only to samples taken after fasting for at least 8 hours.  Glucose, capillary     Status: Abnormal   Collection Time: 08/01/20 11:33 AM  Result Value Ref Range   Glucose-Capillary 102 (H) 70 - 99 mg/dL    Comment: Glucose reference range applies only to samples taken after fasting for at least 8 hours.  Glucose, capillary     Status: None   Collection Time: 08/01/20  3:22 PM  Result Value Ref Range   Glucose-Capillary 72 70 - 99 mg/dL    Comment: Glucose reference range applies only to samples taken after fasting for at least 8 hours.  Glucose, capillary     Status: Abnormal   Collection Time: 08/01/20 10:57 PM  Result Value Ref Range   Glucose-Capillary 135 (H) 70 - 99 mg/dL    Comment: Glucose reference range applies only to samples taken after fasting for at least 8 hours.  Glucose, capillary     Status: None   Collection Time: 08/02/20  6:30 AM  Result Value Ref Range   Glucose-Capillary 96 70 - 99 mg/dL    Comment: Glucose reference range applies only to samples taken after fasting for at least 8 hours.    I have reviewed the patient's current medications.  ASSESSMENT: Principal Problem:   Vaginal bleeding in pregnancy, third trimester Active Problems:   Morbid obesity with BMI of 40.0-44.9, adult (HCC)   Marijuana abuse   Abscess of left axilla   Anemia in pregnancy   Gestational  diabetes mellitus in third trimester   [redacted] weeks gestation of pregnancy   PLAN: No heavy bleeding, will continue to monitor closely. Patient understands that if bleeding continues or there is signs of PTL, may need cesarean delivery especially given breech presentation. Improved fasting blood sugars on Metformin 500 mg po qhs, will continue for now and titrate as needed. Reassuring fetal testing, Category I Continue inpatient monitoring and routine antenatal care.   Verita Schneiders, MD, [place] for [employer], [place]

## 2020-08-03 LAB — GLUCOSE, CAPILLARY
Glucose-Capillary: 79 mg/dL (ref 70–99)
Glucose-Capillary: 114 mg/dL — ABNORMAL HIGH (ref 70–99)

## 2020-08-03 NOTE — Progress Notes (Addendum)
[place] NOTE  Julee Stoll is a 25 y.o. O2V0350 at [redacted]w[redacted]d who is admitted for vaginal bleeding.  Estimated Date of Delivery: 10/01/20 Fetal presentation is breech.  Length of Stay:  5 Days. Admitted 07/29/2020  Subjective: Patient denies any further bleeding since 08/02/20. Reports no contractions or LOF. Good fetal movement.  Vitals:  Blood pressure (!) 100/56, pulse 75, temperature (!) 97.5 F (36.4 C), temperature source Oral, resp. rate 18, height 5\' 5"  (1.651 m), weight 111.1 kg, last menstrual period 12/14/2019, SpO2 96 %, unknown if currently breastfeeding. Physical Examination: CONSTITUTIONAL: No acute distress.  SKIN: Skin is warm and dry. No rash noted. Not diaphoretic. No erythema. No pallor. [place]: Alert and oriented to person, place, and time. Normal reflexes, muscle tone coordination. No cranial nerve deficit noted. PSYCHIATRIC: Normal mood and affect. Normal behavior. Normal judgment and thought content. CARDIOVASCULAR: Normal heart rate noted, regular rhythm RESPIRATORY: Effort and breath sounds normal, no problems with respiration noted MUSCULOSKELETAL: Normal range of motion. No edema and no tenderness. ABDOMEN: Soft, nontender, nondistended, gravid. CERVIX: deferred  Fetal monitoring: FHR: 140 bpm, Variability: moderate, Accelerations: Present, Decelerations: none >> Category I Uterine activity: none  Studies:  CBG (last 3)  Recent Labs    08/02/20 1400 08/02/20 2015 08/03/20 0558  GLUCAP 109* 91 79   CBC Latest Ref Rng & Units 08/02/2020 07/29/2020 01/24/2020  WBC 4.0 - 10.5 K/uL 9.3 10.5 6.5  Hemoglobin 12.0 - 15.0 g/dL 10.3(L) 9.8(L) 13.1  Hematocrit 36.0 - 46.0 % 31.8(L) 29.8(L) 39.4  Platelets 150 - 400 K/uL 274 304 247   CMP Latest Ref Rng & Units 08/02/2020 01/24/2020 07/09/2019  Glucose 70 - 99 mg/dL 96 112(H) 86  BUN 6 - 20 mg/dL 9 10 11   Creatinine 0.44 - 1.00 mg/dL 0.58 0.82 0.82  Sodium 135 - 145 mmol/L 138 140 141   Potassium 3.5 - 5.1 mmol/L 3.8 4.3 3.8  Chloride 98 - 111 mmol/L 104 105 104  CO2 22 - 32 mmol/L 22 25 23   Calcium 8.9 - 10.3 mg/dL 9.3 9.0 9.4  Total Protein 6.5 - 8.1 g/dL 6.4(L) 6.8 7.1  Total Bilirubin 0.3 - 1.2 mg/dL 0.5 1.3(H) 0.2  Alkaline Phos 38 - 126 U/L 97 70 92  AST 15 - 41 U/L 13(L) 42(H) 18  ALT 0 - 44 U/L 14 19 36(H)    [place] MFM OB LIMITED  Result Date: 07/29/2020 ----------------------------------------------------------------------  OBSTETRICS REPORT                       (Signed Final 07/29/2020 01:56 pm) ---------------------------------------------------------------------- Patient Info  ID #:       [id number]                          D.O.B.:  [birth year] (24 yrs)  Name:       Thayer Headings Nudo                    Visit Date: 07/29/2020 09:37 am ---------------------------------------------------------------------- Performed By  Attending:        Johnell Comings MD         Ref. Address:      [address]  [place], [place]  Performed By:     Wilnette Kales        Secondary Phy.:    [facility] MAU/Triage                    RDMS,RVT  Referred By:      Shelle Iron              [facility]                    [place] ---------------------------------------------------------------------- Orders  #  Description                           Code        Ordered By  1  [place] MFM OB LIMITED                     76815.01    MELANIE BHAMBRI ----------------------------------------------------------------------  #  Order #                     Accession #                Episode #  1  [id number]                   [id number]                 [id number] ---------------------------------------------------------------------- Indications  Vaginal bleeding in pregnancy, third  trimester  B09.62  Obesity complicating pregnancy, third           O99.213  trimester  Encounter for antenatal screening,              Z36.9  unspecified  Poor obstetric history-Recurrent (habitual)     O26.20  abortion (3 consecutive ab's)  [redacted] weeks gestation of pregnancy                 Z3A.30 ---------------------------------------------------------------------- Fetal Evaluation  Num Of Fetuses:          1  Fetal Heart Rate(bpm):   148  Cardiac Activity:        Observed  Presentation:            Breech, footling  Placenta:                Anterior  P. Cord Insertion:       Visualized, central  Amniotic Fluid  AFI FV:      Within normal limits  AFI Sum(cm)     %Tile       Largest Pocket(cm)  11.1            23          4.9  RUQ(cm)       RLQ(cm)       LUQ(cm)        LLQ(cm)  4.9           2.2           2.2            1.8  Comment:    No placental abruption or previa identified. ---------------------------------------------------------------------- OB History  Gravidity:    5          SAB:   2  TOP:          1        Living:  1 ---------------------------------------------------------------------- Gestational Age  LMP:           30w 6d        Date:  12/26/19                 EDD:   10/01/20  Best:          Weston Settle 6d     Det. By:  LMP  (12/26/19)          EDD:   10/01/20 ---------------------------------------------------------------------- Anatomy  Cranium:               Appears normal         Heart:                  Appears normal                                                                        (4CH, axis, and                                                                        situs)  Cavum:                 Appears normal         Diaphragm:              Appears normal  Ventricles:            Appears normal         Stomach:                Appears normal, left                                                                        sided  Choroid Plexus:        Appears  normal         Abdomen:                 Appears normal  Cerebellum:            Appears normal         Cord Vessels:           Appears normal (3                                                                        vessel cord)  Posterior Fossa:       Appears normal         Kidneys:                Appear normal  Nuchal Fold:           Not applicable (>59    Bladder:                Appears normal                         wks GA)  Lips:                  Appears normal ---------------------------------------------------------------------- Cervix Uterus Adnexa  Cervix  Length:              4  cm.  Normal appearance by transabdominal scan.  Uterus  No abnormality visualized.  Right Ovary  Within normal limits.  Left Ovary  Within normal limits.  Cul De Sac  No free fluid seen.  Adnexa  No abnormality visualized. ---------------------------------------------------------------------- Comments  This patient presented to the MAU due to vaginal bleeding.  A limited ultrasound performed today shows that the fetus is  in the breech presentation.  There was normal amniotic fluid noted.  The anterior placenta appeared within normal limits with no  retroplacental clots or placenta previa noted. ----------------------------------------------------------------------                   Johnell Comings, MD Electronically Signed Final Report   07/29/2020 01:56 pm ----------------------------------------------------------------------  I have reviewed the patient's current medications.  ASSESSMENT: Principal Problem:   Vaginal bleeding in pregnancy, third trimester Active Problems:   Morbid obesity with BMI of 40.0-44.9, adult (HCC)   Marijuana abuse   Anemia in pregnancy   Gestational diabetes mellitus in third trimester   [redacted] weeks gestation of pregnancy   PLAN: No further bleeding for now, will continue to monitor closely. Patient understands that if bleeding continues or there is signs of PTL, may need cesarean delivery especially given breech  presentation. Stable blood sugars on Metformin 500 mg po qhs, will continue for now and titrate as needed. Reassuring fetal testing, Category I.  Ordered BPP on 08/06/20 for vaginal bleeding and A2GDM, will follow up results and manage accordingly. Labs ordered for every 3 days to keep Type and Screen valid. Continue inpatient monitoring and routine antenatal care. Patient understands that given her last bleeding episode on 08/02/20, discharge is tentatively planned for 08/07/20 if no further bleeding occurs.   Verita Schneiders, MD, [place] for [employer], [place]

## 2020-08-04 ENCOUNTER — Telehealth: Payer: Self-pay | Admitting: *Deleted

## 2020-08-04 NOTE — Telephone Encounter (Signed)
Transition Care Management Unsuccessful Follow-up Telephone Call  Date of discharge and from where:  08/03/2020 - [place]  Attempts:  1st Attempt  Reason for unsuccessful TCM follow-up call:  Left voice message

## 2020-08-04 NOTE — Telephone Encounter (Signed)
-----   Message from Osborne Oman, MD sent at 08/01/2020  9:09 AM EST ----- Regarding: Patient wants to transfer care Gets care in [place], but wants to deliver here at [facility] and transfer prenatal care to [facility].  Hospitalized here for bleeding, likely going home on 3/10.  31 weeks, GDM.  Will need to be seen hopefully in about 1-2 weeks after discharge, if possible.  Thank you!  Verita Schneiders, MD

## 2020-08-04 NOTE — Telephone Encounter (Signed)
Called patient to schedule appt in office for visit.  Scheduled 3/29. Offered 3/24 but patient declined.

## 2020-08-10 ENCOUNTER — Inpatient Hospital Stay (HOSPITAL_COMMUNITY)
Admission: AD | Admit: 2020-08-10 | Discharge: 2020-08-11 | Disposition: A | Discharge status: Home / Self Care | Payer: Medicaid Other | Attending: Obstetrics and Gynecology | Admitting: Obstetrics and Gynecology

## 2020-08-10 ENCOUNTER — Telehealth: Payer: Medicaid Other | Admitting: Orthopedic Surgery

## 2020-08-10 ENCOUNTER — Encounter (HOSPITAL_COMMUNITY): Payer: Self-pay | Admitting: Obstetrics and Gynecology

## 2020-08-10 DIAGNOSIS — N898 Other specified noninflammatory disorders of vagina: Secondary | ICD-10-CM | POA: Diagnosis not present

## 2020-08-10 DIAGNOSIS — M62830 Muscle spasm of back: Secondary | ICD-10-CM | POA: Diagnosis not present

## 2020-08-10 DIAGNOSIS — O26893 Other specified pregnancy related conditions, third trimester: Secondary | ICD-10-CM | POA: Insufficient documentation

## 2020-08-10 DIAGNOSIS — Z791 Long term (current) use of non-steroidal anti-inflammatories (NSAID): Secondary | ICD-10-CM | POA: Diagnosis not present

## 2020-08-10 DIAGNOSIS — Z3A32 32 weeks gestation of pregnancy: Secondary | ICD-10-CM

## 2020-08-10 DIAGNOSIS — Z3A31 31 weeks gestation of pregnancy: Secondary | ICD-10-CM

## 2020-08-10 DIAGNOSIS — Z79899 Other long term (current) drug therapy: Secondary | ICD-10-CM | POA: Diagnosis not present

## 2020-08-10 DIAGNOSIS — Z87891 Personal history of nicotine dependence: Secondary | ICD-10-CM | POA: Insufficient documentation

## 2020-08-10 DIAGNOSIS — M549 Dorsalgia, unspecified: Secondary | ICD-10-CM | POA: Diagnosis not present

## 2020-08-10 DIAGNOSIS — O99891 Other specified diseases and conditions complicating pregnancy: Secondary | ICD-10-CM | POA: Insufficient documentation

## 2020-08-10 DIAGNOSIS — O4693 Antepartum hemorrhage, unspecified, third trimester: Secondary | ICD-10-CM

## 2020-08-10 DIAGNOSIS — M545 Low back pain, unspecified: Secondary | ICD-10-CM | POA: Diagnosis not present

## 2020-08-10 DIAGNOSIS — R109 Unspecified abdominal pain: Secondary | ICD-10-CM | POA: Insufficient documentation

## 2020-08-10 LAB — AMNISURE RUPTURE OF MEMBRANE (ROM) NOT AT ARMC: Amnisure ROM: NEGATIVE

## 2020-08-10 MED ORDER — HYDROMORPHONE HCL 1 MG/ML IJ SOLN
0.5000 mg | Freq: Once | INTRAMUSCULAR | Status: AC
Start: 2020-08-10 — End: 2020-08-10
  Administered 2020-08-10: 0.5 mg via INTRAVENOUS
  Filled 2020-08-10: qty 1

## 2020-08-10 MED ORDER — LACTATED RINGERS IV BOLUS
1000.0000 mL | Freq: Once | INTRAVENOUS | Status: AC
  Administered 2020-08-10: 1000 mL via INTRAVENOUS

## 2020-08-10 MED ORDER — CYCLOBENZAPRINE HCL 10 MG PO TABS: 10.0000 mg | ORAL_TABLET | Freq: Three times a day (TID) | ORAL | 0 refills | Status: DC | PRN

## 2020-08-10 MED ORDER — NAPROXEN 500 MG PO TABS: 500.0000 mg | ORAL_TABLET | Freq: Two times a day (BID) | ORAL | 0 refills | Status: DC

## 2020-08-10 MED ORDER — LACTATED RINGERS IV SOLN: Freq: Once | INTRAVENOUS | Status: AC

## 2020-08-10 MED ORDER — HYDROMORPHONE HCL 1 MG/ML IJ SOLN: 1.0000 mg | Freq: Once | INTRAMUSCULAR | Status: DC

## 2020-08-10 MED ORDER — CYCLOBENZAPRINE HCL 5 MG PO TABS
10.0000 mg | ORAL_TABLET | Freq: Once | ORAL | Status: AC
  Administered 2020-08-10: 10 mg via ORAL
  Filled 2020-08-10: qty 2

## 2020-08-10 NOTE — MAU Provider Note (Signed)
Chief Complaint:  Abdominal Pain   seen by provider at 2235   HPI: Colleen Valentine is a 25 y.o. [phone] at 12w4dwho presents to maternity admissions reporting severe back pain, leaking of brown liquid, and feeling the need to push. . States "water broke" at home, brown tinge to it.  Feeling contractions "all in my back".  Crying out, writhing in pain on admission.  Flailing and kicking at nurses.  Pregnancy has been followed in [place], but pt is inprocess of transferring to [facility].   Was recently admitted for small amount of vaginal bleeding.   Abdominal Pain This is a recurrent problem. The current episode started today. The problem occurs intermittently. The problem has been unchanged. The pain is located in the generalized abdominal region. The pain is severe. The quality of the pain is cramping, aching and sharp. The abdominal pain radiates to the back (pain is primarily in her back). Pertinent negatives include no constipation, diarrhea, dysuria, fever, frequency, myalgias, nausea or vomiting. Nothing aggravates the pain. The pain is relieved by nothing. She has tried nothing for the symptoms.    RN Note: PT SAYS HAS FEELING TO PUSH - BROUGHT FROM LOBBY . VE FT- 60, -3.   Past Medical History: Past Medical History:  Diagnosis Date  . Acute pyelonephritis 05/17/2018  . Anemia   . Anxiety   . Complication of anesthesia   . Depression   . GERD (gastroesophageal reflux disease)   . Herpes genitalia   . Insomnia   . Obesity   . Polysubstance abuse ([place]) 04/01/2011  . PONV (postoperative nausea and vomiting)   . Seizures ([place])    once in 2016 due to alcohol poisioning  . Suicide ([place])     Past obstetric history: OB History  Gravida Para Term Preterm AB Living  5 1 1   3 1   SAB IAB Ectopic Multiple Live Births  2 1     1     # Outcome Date GA Lbr Len/2nd Weight Sex Delivery Anes PTL Lv  5 Current           4 SAB 2019          3 SAB 2015          2 IAB           1 Term       Vag-Spont   LIV    Past Surgical History: Past Surgical History:  Procedure Laterality Date  . CHOLECYSTECTOMY  2015  . COSMETIC SURGERY  Age 69   dog bite to face    Family History: Family History  Problem Relation Age of Onset  . Alcohol abuse Mother   . Stroke Mother   . Bipolar disorder Mother   . Drug abuse Mother   . Alcohol abuse Father   . Bipolar disorder Father   . Drug abuse Father   . Colon cancer Maternal Grandfather   . Crohn's disease Paternal Grandmother   . Crohn's disease Paternal Aunt   . Crohn's disease Paternal Aunt   . Crohn's disease Paternal Uncle   . Colon cancer Paternal Uncle   . Cancer Maternal Aunt        "stomach cancer"  . Celiac disease Neg Hx     Social History: Social History   Tobacco Use  . Smoking status: Former Smoker    Packs/day: 0.50    Years: 5.00    Pack years: 2.50    Types: Cigarettes    Quit  date: 11/24/2019    Years since quitting: 0.7  . Smokeless tobacco: Never Used  . Tobacco comment: one pack cigarettes daily  Vaping Use  . Vaping Use: Some days  Substance Use Topics  . Alcohol use: Not Currently    Alcohol/week: 0.0 standard drinks    Comment: social  . Drug use: Yes    Types: Marijuana    Comment: SMOKED TODAY    Allergies: No Known Allergies  Meds:  Medications Prior to Admission  Medication Sig Dispense Refill Last Dose  . acetaminophen (TYLENOL) 500 MG tablet Take 1,000 mg by mouth every 6 (six) hours as needed for mild pain or headache.   08/10/2020 at Unknown time  . cetirizine (ZYRTEC) 10 MG tablet Take 1 tablet (10 mg total) by mouth daily. 30 tablet 0 Past Month at Unknown time  . hydrOXYzine (VISTARIL) 25 MG capsule Take 1 capsule (25 mg total) by mouth daily as needed for anxiety. 30 capsule 1 Past Week at Unknown time  . ibuprofen (ADVIL) 600 MG tablet Take 600 mg by mouth every 6 (six) hours as needed for moderate pain.   08/10/2020 at Unknown time  . lamoTRIgine (LAMICTAL) 25 MG tablet Take  25 mg by mouth 2 (two) times daily.   Past Week at Unknown time  . omeprazole (PRILOSEC) 20 MG capsule Take 20 mg by mouth daily.   Past Week at Unknown time  . valACYclovir (VALTREX) 500 MG tablet Take 500 mg by mouth daily.   Past Month at Unknown time  . cyclobenzaprine (FLEXERIL) 10 MG tablet Take 1 tablet (10 mg total) by mouth 3 (three) times daily as needed for muscle spasms. 30 tablet 0 NONE YET  . fluticasone (FLONASE) 50 MCG/ACT nasal spray Place 2 sprays into both nostrils daily. 16 g 0   . naproxen (NAPROSYN) 500 MG tablet Take 1 tablet (500 mg total) by mouth 2 (two) times daily with a meal. 60 tablet 0 More than a month at Unknown time    I have reviewed patient's Past Medical Hx, Surgical Hx, Family Hx, Social Hx, medications and allergies.   ROS:  Review of Systems  Constitutional: Negative for fever.  Gastrointestinal: Positive for abdominal pain. Negative for constipation, diarrhea, nausea and vomiting.  Genitourinary: Negative for dysuria and frequency.  Musculoskeletal: Negative for myalgias.   Other systems negative  Physical Exam   Patient Vitals for the past 24 hrs:  Temp Temp src Resp SpO2  08/10/20 2250 98.3 F (36.8 C) Oral 12 98 %   Constitutional: Well-developed, well-nourished female writhing in pain, crying out, kicking legs  Cardiovascular: normal rate and rhythm Respiratory: normal effort GI: Abd exam difficult due to presentation, gravid appropriate for gestational age.   No rebound or guarding.  After she calmed down, abd soft and nontender MS: Extremities nontender, no edema, normal ROM  Lumbar spine tender  Neurologic: Alert and oriented x 4.  GU: Neg CVAT.  PELVIC EXAM: Mucous discharge.  No ferning.  Amnisure sent due to history Dilation: Fingertip Station: -3 Exam by:: MARIE, CNM  FHT:  Baseline 140 , moderate variability, accelerations present, no decelerations Contractions: Occasional and  Irregular    Labs: Results for orders placed  or performed during the hospital encounter of 08/10/20 (from the past 24 hour(s))  Amnisure rupture of membrane (rom)not at [facility]     Status: None   Collection Time: 08/10/20 11:01 PM  Result Value Ref Range   Amnisure ROM NEGATIVE   Urinalysis, Routine  w reflex microscopic Urine, Clean Catch     Status: Abnormal   Collection Time: 08/10/20 11:38 PM  Result Value Ref Range   Color, Urine YELLOW YELLOW   APPearance HAZY (A) CLEAR   Specific Gravity, Urine 1.035 (H) 1.005 - 1.030   pH 6.0 5.0 - 8.0   Glucose, UA 150 (A) NEGATIVE mg/dL   Hgb urine dipstick NEGATIVE NEGATIVE   Bilirubin Urine NEGATIVE NEGATIVE   Ketones, ur NEGATIVE NEGATIVE mg/dL   Protein, ur 30 (A) NEGATIVE mg/dL   Nitrite NEGATIVE NEGATIVE   Leukocytes,Ua NEGATIVE NEGATIVE   RBC / HPF 0-5 0 - 5 RBC/hpf   WBC, UA 0-5 0 - 5 WBC/hpf   Bacteria, UA NONE SEEN NONE SEEN   Squamous Epithelial / LPF 0-5 0 - 5   Mucus PRESENT     --/--/O POS (03/09 1552)  Imaging:    MAU Course/MDM: I have ordered labs and reviewed results. UA is clear, ruling out pyelo or stones.  NST reviewed, reassuring. There have been only occasional contractions.  Most of pain seems to be related to lower back with spasming of muscles.  Flexeril given with moderate relief.  Percocet x 1 given when pt continued to complain.  DIscussed we may need to refer her for PT and that meds may not completely relieve pain.   Chart review reveals an E-visit for low back pain They rxed Flexeril and Naproxen (I discontinued naproxen and messaged the PA who prescribed it)  Treatments in MAU included Dilaudid, Flexeril  Discussed back spasm.  Not in labor, no strong contractions. No ROM.  No bleeding. .    Assessment: [place] IUPat [redacted]w[redacted]d Lower back spasm Vaginal discharge, no ROM   Plan: Discharge home Preterm Labor precautions and fetal kick counts Follow up in Office for prenatal visits  Recommend discussing PT for back pain Rx Flexeril already done with  evisit Rx limited Rx Tramadol for couple of days.  Encouraged to return if she develops worsening of symptoms, increase in pain, fever, or other concerning symptoms.  Pt stable at time of discharge.  Hansel Feinstein CNM, MSN Certified Nurse-Midwife 08/10/2020 11:24 PM

## 2020-08-10 NOTE — MAU Note (Signed)
PT SAYS HAS FEELING TO PUSH - BROUGHT FROM LOBBY . VE FT- 60, -3.

## 2020-08-10 NOTE — Progress Notes (Signed)
We are sorry that you are not feeling well.  Here is how we plan to help!  Based on what you have shared with me it looks like you mostly have acute back pain.  Acute back pain is defined as musculoskeletal pain that can resolve in 1-3 weeks with conservative treatment.  I have prescribed Naprosyn 500 mg take one by mouth twice a day non-steroid anti-inflammatory (NSAID) as well as Flexeril 10 mg every eight hours as needed which is a muscle relaxer  Some patients experience stomach irritation or in increased heartburn with anti-inflammatory drugs.  Please keep in mind that muscle relaxer's can cause fatigue and should not be taken while at work or driving.  Back pain is very common.  The pain often gets better over time.  The cause of back pain is usually not dangerous.  Most people can learn to manage their back pain on their own.  If you don't get relief in a couple of days with this regimen contact us again; prednisone may help. I'm working again Saturday from 8-2.  Home Care  Stay active.  Start with short walks on flat ground if you can.  Try to walk farther each day.  Do not sit, drive or stand in one place for more than 30 minutes.  Do not stay in bed.  Do not avoid exercise or work.  Activity can help your back heal faster.  Be careful when you bend or lift an object.  Bend at your knees, keep the object close to you, and do not twist.  Sleep on a firm mattress.  Lie on your side, and bend your knees.  If you lie on your back, put a pillow under your knees.  Only take medicines as told by your doctor.  Put ice on the injured area.  Put ice in a plastic bag  Place a towel between your skin and the bag  Leave the ice on for 15-20 minutes, 3-4 times a day for the first 2-3 days. 210 After that, you can switch between ice and heat packs.  Ask your doctor about back exercises or massage.  Avoid feeling anxious or stressed.  Find good ways to deal with stress, such as  exercise.  Get Help Right Way If:  Your pain does not go away with rest or medicine.  Your pain does not go away in 1 week.  You have new problems.  You do not feel well.  The pain spreads into your legs.  You cannot control when you poop (bowel movement) or pee (urinate)  You feel sick to your stomach (nauseous) or throw up (vomit)  You have belly (abdominal) pain.  You feel like you may pass out (faint).  If you develop a fever.  Make Sure you:  Understand these instructions.  Will watch your condition  Will get help right away if you are not doing well or get worse.  Your e-visit answers were reviewed by a board certified advanced clinical practitioner to complete your personal care plan.  Depending on the condition, your plan could have included both over the counter or prescription medications.  If there is a problem please reply  once you have received a response from your provider.  Your safety is important to [place].  If you have drug allergies check your prescription carefully.    You can use MyChart to ask questions about today's visit, request a non-urgent call back, or ask for a work or school excuse for  24 hours related to this e-Visit. If it has been greater than 24 hours you will need to follow up with your provider, or enter a new e-Visit to address those concerns.  You will get an e-mail in the next two days asking about your experience.  I hope that your e-visit has been valuable and will speed your recovery. Thank you for using e-visits.  Greater than 5 minutes, yet less than 10 minutes of time have been spent researching, coordinating and implementing care for this patient today.

## 2020-08-11 LAB — URINALYSIS, ROUTINE W REFLEX MICROSCOPIC
Bacteria, UA: NONE SEEN
Bilirubin Urine: NEGATIVE
Glucose, UA: 150 mg/dL — AB
Hgb urine dipstick: NEGATIVE
Ketones, ur: NEGATIVE mg/dL
Leukocytes,Ua: NEGATIVE
Nitrite: NEGATIVE
Protein, ur: 30 mg/dL — AB
Specific Gravity, Urine: 1.035 — ABNORMAL HIGH (ref 1.005–1.030)
pH: 6 (ref 5.0–8.0)

## 2020-08-11 MED ORDER — OXYCODONE-ACETAMINOPHEN 5-325 MG PO TABS
1.0000 | ORAL_TABLET | Freq: Once | ORAL | Status: AC
  Administered 2020-08-11: 1 via ORAL
  Filled 2020-08-11: qty 1

## 2020-08-11 MED ORDER — TRAMADOL HCL 50 MG PO TABS: 50.0000 mg | ORAL_TABLET | Freq: Four times a day (QID) | ORAL | 0 refills | Status: DC | PRN

## 2020-08-11 NOTE — Discharge Instructions (Signed)
Back Pain in Pregnancy Back pain during pregnancy is common. Back pain may be caused by several factors that are related to changes during your pregnancy. Follow these instructions at home: Managing pain, stiffness, and swelling  If directed, for sudden (acute) back pain, put ice on the painful area. ? Put ice in a plastic bag. ? Place a towel between your skin and the bag. ? Leave the ice on for 20 minutes, 2-3 times per day.  If directed, apply heat to the affected area before you exercise. Use the heat source that your health care provider recommends, such as a moist heat pack or a heating pad. ? Place a towel between your skin and the heat source. ? Leave the heat on for 20-30 minutes. ? Remove the heat if your skin turns bright red. This is especially important if you are unable to feel pain, heat, or cold. You may have a greater risk of getting burned.  If directed, massage the affected area.      Activity  Exercise as told by your health care provider. Gentle exercise is the best way to prevent or manage back pain.  Listen to your body when lifting. If lifting hurts, ask for help or bend your knees. This uses your leg muscles instead of your back muscles.  Squat down when picking up something from the floor. Do not bend over.  Only use bed rest for short periods as told by your health care provider. Bed rest should only be used for the most severe episodes of back pain. Standing, sitting, and lying down  Do not stand in one place for long periods of time.  Use good posture when sitting. Make sure your head rests over your shoulders and is not hanging forward. Use a pillow on your lower back if necessary.  Try sleeping on your side, preferably the left side, with a pregnancy support pillow or 1-2 regular pillows between your legs. ? If you have back pain after a night's rest, your bed may be too soft. ? A firm mattress may provide more support for your back during  pregnancy. General instructions  Do not wear high heels.  Eat a healthy diet. Try to gain weight within your health care provider's recommendations.  Use a maternity girdle, elastic sling, or back brace as told by your health care provider.  Take over-the-counter and prescription medicines only as told by your health care provider.  Work with a physical therapist or massage therapist to find ways to manage back pain. Acupuncture or massage therapy may be helpful.  Keep all follow-up visits as told by your health care provider. This is important. Contact a health care provider if:  Your back pain interferes with your daily activities.  You have increasing pain in other parts of your body. Get help right away if:  You develop numbness, tingling, weakness, or problems with the use of your arms or legs.  You develop severe back pain that is not controlled with medicine.  You have a change in bowel or bladder control.  You develop shortness of breath, dizziness, or you faint.  You develop nausea, vomiting, or sweating.  You have back pain that is a rhythmic, cramping pain similar to labor pains. Labor pain is usually 1-2 minutes apart, lasts for about 1 minute, and involves a bearing down feeling or pressure in your pelvis.  You have back pain and your water breaks or you have vaginal bleeding.  You have back pain or  numbness that travels down your leg.  Your back pain developed after you fell.  You develop pain on one side of your back.  You see blood in your urine.  You develop skin blisters in the area of your back pain. Summary  Back pain may be caused by several factors that are related to changes during your pregnancy.  Follow instructions as told by your health care provider for managing pain, stiffness, and swelling.  Exercise as told by your health care provider. Gentle exercise is the best way to prevent or manage back pain.  Take over-the-counter and  prescription medicines only as told by your health care provider.  Keep all follow-up visits as told by your health care provider. This is important. This information is not intended to replace advice given to you by your health care provider. Make sure you discuss any questions you have with your health care provider. Document Revised: 09/01/2018 Document Reviewed: 10/29/2017 Elsevier Patient Education  [place].

## 2020-08-12 ENCOUNTER — Telehealth: Payer: Medicaid Other | Admitting: Orthopedic Surgery

## 2020-08-12 DIAGNOSIS — M5442 Lumbago with sciatica, left side: Secondary | ICD-10-CM

## 2020-08-12 DIAGNOSIS — M5441 Lumbago with sciatica, right side: Secondary | ICD-10-CM

## 2020-08-12 NOTE — Progress Notes (Signed)
Based on what you shared with me, I feel your condition warrants further evaluation and I recommend that you be seen for a face to face office visit.  I'm afraid I don't have anything else to offer through this platform. I would recommend that you go to the emergency department based on your symptoms.   NOTE: If you entered your credit card information for this eVisit, you will not be charged. You may see a "hold" on your card for the $35 but that hold will drop off and you will not have a charge processed.   If you are having a true medical emergency please call 911.      For an urgent face to face visit, [facility] has five urgent care centers for your convenience:     [facility] [place] Get Driving Directions [phone] [address] [place], [place] [address] . 10 am - 6pm Monday - Friday    [facility]) Get Driving Directions [phone] [address] [place], [place] [address] . 10 am to 8 pm Monday-Friday . 12 pm to 8 pm [facility] Get Driving Directions [phone] [place], [place], [place] [address] . 8 am to 8 pm Monday-Friday . 9 am to 6 pm Saturday . 11 am to 6 pm Sunday     [facility] at [facility] Get Driving Directions  [phone] [address] [place], [place] [address] . 8 am to 8 pm Monday-Friday . 8 am to 4 pm [facility] at [place] Get Driving Directions [phone] [address] [place], [place] [address] . 12 pm to 6 pm Monday-Friday      Your e-visit answers were reviewed by a board certified advanced clinical practitioner to complete your personal care plan.  Thank you for using e-Visits.

## 2020-08-13 ENCOUNTER — Other Ambulatory Visit: Payer: Self-pay

## 2020-08-13 ENCOUNTER — Encounter (HOSPITAL_COMMUNITY): Payer: Self-pay

## 2020-08-13 ENCOUNTER — Emergency Department (HOSPITAL_COMMUNITY): Payer: Medicaid Other

## 2020-08-13 ENCOUNTER — Emergency Department (HOSPITAL_COMMUNITY)
Admission: EM | Admit: 2020-08-13 | Discharge: 2020-08-13 | Disposition: A | Discharge status: Home / Self Care | Payer: Medicaid Other | Attending: Emergency Medicine | Admitting: Emergency Medicine

## 2020-08-13 DIAGNOSIS — M5442 Lumbago with sciatica, left side: Secondary | ICD-10-CM | POA: Insufficient documentation

## 2020-08-13 DIAGNOSIS — F17210 Nicotine dependence, cigarettes, uncomplicated: Secondary | ICD-10-CM | POA: Insufficient documentation

## 2020-08-13 DIAGNOSIS — Z3A33 33 weeks gestation of pregnancy: Secondary | ICD-10-CM | POA: Insufficient documentation

## 2020-08-13 DIAGNOSIS — O2693 Pregnancy related conditions, unspecified, third trimester: Secondary | ICD-10-CM | POA: Insufficient documentation

## 2020-08-13 DIAGNOSIS — W19XXXA Unspecified fall, initial encounter: Secondary | ICD-10-CM | POA: Diagnosis not present

## 2020-08-13 DIAGNOSIS — O99891 Other specified diseases and conditions complicating pregnancy: Secondary | ICD-10-CM | POA: Diagnosis not present

## 2020-08-13 DIAGNOSIS — O99333 Smoking (tobacco) complicating pregnancy, third trimester: Secondary | ICD-10-CM | POA: Diagnosis not present

## 2020-08-13 DIAGNOSIS — M545 Low back pain, unspecified: Secondary | ICD-10-CM | POA: Diagnosis not present

## 2020-08-13 MED ORDER — FENTANYL CITRATE (PF) 100 MCG/2ML IJ SOLN
100.0000 ug | Freq: Once | INTRAMUSCULAR | Status: AC
  Administered 2020-08-13: 100 ug via INTRAVENOUS
  Filled 2020-08-13: qty 2

## 2020-08-13 MED ORDER — CYCLOBENZAPRINE HCL 10 MG PO TABS
10.0000 mg | ORAL_TABLET | Freq: Once | ORAL | Status: AC
  Administered 2020-08-13: 10 mg via ORAL
  Filled 2020-08-13: qty 1

## 2020-08-13 MED ORDER — FENTANYL CITRATE (PF) 100 MCG/2ML IJ SOLN
50.0000 ug | Freq: Once | INTRAMUSCULAR | Status: AC
Start: 2020-08-13 — End: 2020-08-13
  Administered 2020-08-13: 50 ug via INTRAVENOUS
  Filled 2020-08-13: qty 2

## 2020-08-13 NOTE — ED Provider Notes (Signed)
Patient signed out to me by Dr. Jeanell Sparrow pending results of MRI of cervical, thoracic, lumbar spine.  Patient continued to complain of severe pain and not being able to lay on the table.  Was given an additional dose of fentanyl 100 mcg.  Patient still unable to perform his procedure.  She has no focal neurological deficits.  Patient to be discharged   Lacretia Leigh, MD 08/13/20 1755

## 2020-08-13 NOTE — ED Provider Notes (Signed)
Charge nurse requested me to evaluate patient for appropriateness to transfer to MAU.  Patient is a 25 year old female G2 who is currently [redacted] weeks pregnant presenting complaining of back pain, leg weakness and recurrent fall for the past 2 days.  She also complaining of left arm weakness as well.  She did endorse 1 abdominal contraction recently but otherwise denies having abdominal pain vaginal bleeding or vaginal discharge.  She will need to be evaluated further in our ER.   Domenic Moras, PA-C 08/13/20 1303    Charlesetta Shanks, MD 08/13/20 2033

## 2020-08-13 NOTE — ED Provider Notes (Addendum)
[facility] EMERGENCY DEPARTMENT Provider Note   CSN: [id number] Arrival date & time: 08/13/20  1227     History No chief complaint on file.   Colleen Valentine is a 25 y.o. female.  HPI 25 year old female G5, P1 [redacted] weeks gestation presents today complaining of of back pain and fall.  She reports that [facility].  She states that the back pain began  2 weeks ago.  It is in the center of the low back and radiates across the left.  She has some numbness in her arm and her leg.  The pain has caused her back to lock up and she has fallen multiple times.  She fell again today.  States that she fell backwards.  She denies  landing on her abdomen.  She denies abdominal pain.  She reports that she had vaginal bleeding 1 week ago and was seen in [facility]U.  She does not report that she knows what caused it.  She states she was told that she was not leaking any amniotic fluid.  States she has had some vaginal discharge but also was seen for that.  She denies any urinary tract infection symptoms.  She denies striking her head, being on blood thinners, or other new injury.  She reports taking Flexeril and tramadol for pain.    Past Medical History:  Diagnosis Date  . Acute pyelonephritis 05/17/2018  . Anemia   . Anxiety   . Complication of anesthesia   . Depression   . GERD (gastroesophageal reflux disease)   . Herpes genitalia   . Insomnia   . Obesity   . Polysubstance abuse ([place]) 04/01/2011  . PONV (postoperative nausea and vomiting)   . Seizures ([place])    once in 2016 due to alcohol poisioning  . Suicide [facility])     Patient Active Problem List   Diagnosis Date Noted  . [redacted] weeks gestation of pregnancy 07/31/2020  . Vaginal bleeding in pregnancy, third trimester 07/29/2020  . Marijuana abuse 07/29/2020  . Abscess of left axilla 07/29/2020  . Anemia in pregnancy 07/29/2020  . Gestational diabetes mellitus in third trimester 07/29/2020  . Chlamydia  infection 01/25/2020  . Abdominal pain 02/27/2017  . Lower abdominal pain 02/27/2017  . Esophageal dysphagia 02/27/2017  . Current smoker 08/12/2016  . Genital herpes 04/08/2016  . GERD (gastroesophageal reflux disease) 10/17/2015  . Morbid obesity with BMI of 40.0-44.9, adult ([place]) 10/17/2015  . ADHD (attention deficit hyperactivity disorder), combined type 07/21/2012  . MDD (major depressive disorder), recurrent episode, moderate ([place]) 04/01/2011  . Oppositional defiant disorder 04/01/2011    Past Surgical History:  Procedure Laterality Date  . CHOLECYSTECTOMY  2015  . COSMETIC SURGERY  Age 47   dog bite to face     OB History    Gravida  5   Para  1   Term  1   Preterm      AB  3   Living  1     SAB  2   IAB  1   Ectopic      Multiple      Live Births  1           Family History  Problem Relation Age of Onset  . Alcohol abuse Mother   . Stroke Mother   . Bipolar disorder Mother   . Drug abuse Mother   . Alcohol abuse Father   . Bipolar disorder Father   .  Drug abuse Father   . Colon cancer Maternal Grandfather   . Crohn's disease Paternal Grandmother   . Crohn's disease Paternal Aunt   . Crohn's disease Paternal Aunt   . Crohn's disease Paternal Uncle   . Colon cancer Paternal Uncle   . Cancer Maternal Aunt        "stomach cancer"  . Celiac disease Neg Hx     Social History   Tobacco Use  . Smoking status: Former Smoker    Packs/day: 0.50    Years: 5.00    Pack years: 2.50    Types: Cigarettes    Quit date: 11/24/2019    Years since quitting: 0.7  . Smokeless tobacco: Never Used  . Tobacco comment: one pack cigarettes daily  Vaping Use  . Vaping Use: Some days  Substance Use Topics  . Alcohol use: Not Currently    Alcohol/week: 0.0 standard drinks    Comment: social  . Drug use: Yes    Types: Marijuana    Comment: SMOKED TODAY    Home Medications Prior to Admission medications   Medication Sig Start Date End Date Taking?  Authorizing Provider  acetaminophen (TYLENOL) 500 MG tablet Take 1,000 mg by mouth every 6 (six) hours as needed for mild pain or headache.    [provider]  cetirizine (ZYRTEC) 10 MG tablet Take 1 tablet (10 mg total) by mouth daily. 03/29/20   Wurst, [place], PA-C  cyclobenzaprine (FLEXERIL) 10 MG tablet Take 1 tablet (10 mg total) by mouth 3 (three) times daily as needed for muscle spasms. 08/10/20   Lisette Abu, PA-C  fluticasone (FLONASE) 50 MCG/ACT nasal spray Place 2 sprays into both nostrils daily. 03/29/20   Wurst, [place], PA-C  hydrOXYzine (VISTARIL) 25 MG capsule Take 1 capsule (25 mg total) by mouth daily as needed for anxiety. 11/18/19   Norman Clay, MD  lamoTRIgine (LAMICTAL) 25 MG tablet Take 25 mg by mouth 2 (two) times daily.    [provider]  omeprazole (PRILOSEC) 20 MG capsule Take 20 mg by mouth daily.    [provider]  traMADol (ULTRAM) 50 MG tablet Take 1 tablet (50 mg total) by mouth every 6 (six) hours as needed for severe pain. 08/11/20   Seabron Spates, CNM  valACYclovir (VALTREX) 500 MG tablet Take 500 mg by mouth daily.    [provider]  albuterol (VENTOLIN HFA) 108 (90 Base) MCG/ACT inhaler Inhale 1-2 puffs into the lungs every 6 (six) hours as needed for wheezing or shortness of breath. Patient not taking: Reported on 11/18/2019 06/14/19 01/01/20  Wurst, [place], PA-C  pantoprazole (PROTONIX) 40 MG tablet Take 1 tablet (40 mg total) by mouth daily before breakfast. 06/25/18 01/14/19  Baruch Gouty, FNP    Allergies    Patient has no known allergies.  Review of Systems   Review of Systems  All other systems reviewed and are negative.   Physical Exam Updated Vital Signs BP 129/87 (BP Location: Left Arm)   Pulse (!) 119   Temp 97.7 F (36.5 C) (Oral)   Resp 16   LMP 12/14/2019 Comment: has had some spotting  SpO2 100%   Physical Exam Vitals and nursing note reviewed.  Constitutional:      General: She is  not in acute distress.    Appearance: Normal appearance. She is obese.  HENT:     Head: Normocephalic.     Right Ear: External ear normal.     Left Ear: External  ear normal.     Nose: Nose normal.     Mouth/Throat:     Mouth: Mucous membranes are moist.  Eyes:     Pupils: Pupils are equal, round, and reactive to light.  Cardiovascular:     Rate and Rhythm: Normal rate and regular rhythm.     Pulses: Normal pulses.  Pulmonary:     Effort: Pulmonary effort is normal.  Abdominal:     Comments: Uterus is gravid with some tenderness along the superior aspect no external signs of trauma are noted  Musculoskeletal:        General: Normal range of motion.     Cervical back: Normal range of motion.     Comments: Back visualized no obvious external signs of trauma Spine palpated and diffuse tenderness throughout the lumbar spine in the low back area noted.  Skin:    General: Skin is warm and dry.     Capillary Refill: Capillary refill takes less than 2 seconds.  Neurological:     General: No focal deficit present.     Mental Status: She is alert and oriented to person, place, and time.     Cranial Nerves: No cranial nerve deficit.     Motor: No weakness.     Gait: Gait normal.  Psychiatric:        Mood and Affect: Mood normal.        Behavior: Behavior normal.     ED Results / Procedures / Treatments   Labs (all labs ordered are listed, but only abnormal results are displayed) Labs Reviewed - No data to display  EKG None  Radiology No results found.  Procedures Procedures   Medications Ordered in ED Medications - No data to display  ED Course  I have reviewed the triage vital signs and the nursing notes.  Pertinent labs & imaging results that were available during my care of the patient were reviewed by me and considered in my medical decision making (see chart for details).    MDM Rules/Calculators/A&P                         Plan fetal monitoring-patient refused  after rapid ob came stating she didn't like the rapid response nurse's attitude Fentanyl for pain No acute neurological deficit However, patient reports some difficulty having a bowel movement With intransigent symptoms.  She has had several falls.  Plan MRI. Patient care discussed with Dr. Zenia Resides. Plan discharge if negative. Dr. Zenia Resides will review MRI and make appropriate disposition  Final Clinical Impression(s) / ED Diagnoses Final diagnoses:  Acute left-sided low back pain with left-sided sciatica  Fall, initial encounter  [redacted] weeks gestation of pregnancy    Rx / DC Orders ED Discharge Orders    None       Pattricia Boss, MD 08/13/20 2229    Pattricia Boss, MD 08/22/20 1253

## 2020-08-13 NOTE — Discharge Summary (Signed)
Physician Discharge Summary  Patient ID: Colleen Valentine MRN: [id number] DOB/AGE: [birth year] 25 y.o.  Admit date: 07/29/2020 Discharge date: 08/03/20  Admission Diagnoses:  Discharge Diagnoses:  Principal Problem:   Vaginal bleeding in pregnancy, third trimester Active Problems:   Morbid obesity with BMI of 40.0-44.9, adult (HCC)   Marijuana abuse   Anemia in pregnancy   Gestational diabetes mellitus in third trimester   [redacted] weeks gestation of pregnancy   Discharged Condition: stable  Hospital Course: Pt admitted for inpatient observation due to third trimester vaginal bleeding.  She was also monitored for gestational diabetes.  On 08/01/20 the patient had a small recurrence of bleeding.  Pt left against medical advice on 08/03/20 due to significant child care issues.  Consults: None  Significant Diagnostic Studies: radiology: Ultrasound: live IUP, no abruption or previa  Treatments: inpatient monitoring  Discharge Exam: Blood pressure 118/68, pulse (!) 101, temperature 97.8 F (36.6 C), temperature source Oral, resp. rate 18, height 5\' 5"  (1.651 m), weight 111.1 kg, last menstrual period 12/14/2019, SpO2 99 %, unknown if currently breastfeeding. General appearance: not assessed, left AMA  Disposition:  There are no questions and answers to display.      left AMA   Allergies as of 08/03/2020   No Known Allergies     Medication List    ASK your doctor about these medications   acetaminophen 500 MG tablet Commonly known as: TYLENOL Take 1,000 mg by mouth every 6 (six) hours as needed for mild pain or headache.   cetirizine 10 MG tablet Commonly known as: ZYRTEC Take 1 tablet (10 mg total) by mouth daily.   fluticasone 50 MCG/ACT nasal spray Commonly known as: FLONASE Place 2 sprays into both nostrils daily.   hydrOXYzine 25 MG capsule Commonly known as: VISTARIL Take 1 capsule (25 mg total) by mouth daily as needed for anxiety.   lamoTRIgine 25 MG  tablet Commonly known as: LAMICTAL Take 25 mg by mouth 2 (two) times daily.   omeprazole 20 MG capsule Commonly known as: PRILOSEC Take 20 mg by mouth daily.   valACYclovir 500 MG tablet Commonly known as: VALTREX Take 500 mg by mouth daily. Ask about: Which instructions should I use?      Pt advised to return to hospital if there was any recurrence of bleeding.  Signed: Griffin Basil 08/13/2020, 10:29 AM

## 2020-08-13 NOTE — ED Notes (Signed)
Patient transported to MRI 

## 2020-08-13 NOTE — ED Notes (Signed)
Pt ambulatory to wheelchair, understanding of d/c instructions. Wheeled to the lobby for pickup.

## 2020-08-13 NOTE — ED Triage Notes (Signed)
Patient complains of increased back pain since being evaluated on 3/17. Patient is [redacted] weeks pregnant and now reports multiple falls due to the leg numbness

## 2020-08-13 NOTE — Progress Notes (Signed)
Pt brought to dept to attempt MRI. Pt transferred to out table and attempted to lay down. Immediately sat right up and said "fxxx no, I can't do this." Called RN to make aware and to see if more meds could be given, pt had been given all the meds that could be given at the time. Pt sent back to room.

## 2020-08-13 NOTE — Progress Notes (Signed)
Pt is a G5P1 at [redacted] weeks gestation cpmplaining of back pain and leg weakness. She denies abd pain, vaginal bleeding or leaking of fluid. Pt is refusing fetal monitoring. ED staff notified.

## 2020-08-14 ENCOUNTER — Telehealth: Payer: Self-pay

## 2020-08-14 ENCOUNTER — Other Ambulatory Visit: Payer: Self-pay | Admitting: *Deleted

## 2020-08-14 DIAGNOSIS — M62830 Muscle spasm of back: Secondary | ICD-10-CM

## 2020-08-14 DIAGNOSIS — Z3A33 33 weeks gestation of pregnancy: Secondary | ICD-10-CM

## 2020-08-14 NOTE — Telephone Encounter (Signed)
Transition Care Management Follow-up Telephone Call  Date of discharge and from where: 08/13/2020 from [facility]   How have you been since you were released from the hospital? Pt states that she is still in pain and the pain is a 10/10  Any questions or concerns? No  Items Reviewed:  Did the pt receive and understand the discharge instructions provided? Yes   Medications obtained and verified? Yes   Other? No   Any new allergies since your discharge? No   Dietary orders reviewed? n/a  Do you have support at home? Yes   Functional Questionnaire: (I = Independent and D = Dependent) ADLs: I  Bathing/Dressing- I  Meal Prep- I  Eating- I  Maintaining continence- I  Transferring/Ambulation- I  Managing Meds- I   Follow up appointments reviewed:   PCP Hospital f/u appt confirmed? No    Specialist Hospital f/u appt confirmed? Yes  Scheduled to see Wells Guiles, CNM on 08/22/2020 @ 03:10pm .  Are transportation arrangements needed? No    If their condition worsens, is the pt aware to call PCP or go to the Emergency Dept.? Yes  Was the patient provided with contact information for the PCP's office or ED? Yes  Was to pt encouraged to call back with questions or concerns? Yes

## 2020-08-15 ENCOUNTER — Encounter (HOSPITAL_COMMUNITY): Payer: Self-pay | Admitting: Obstetrics & Gynecology

## 2020-08-15 ENCOUNTER — Inpatient Hospital Stay (HOSPITAL_COMMUNITY)
Admission: AD | Admit: 2020-08-15 | Discharge: 2020-08-16 | Disposition: A | Discharge status: Home / Self Care | Payer: Medicaid Other | Attending: Obstetrics & Gynecology | Admitting: Obstetrics & Gynecology

## 2020-08-15 DIAGNOSIS — Z79899 Other long term (current) drug therapy: Secondary | ICD-10-CM | POA: Insufficient documentation

## 2020-08-15 DIAGNOSIS — O4693 Antepartum hemorrhage, unspecified, third trimester: Secondary | ICD-10-CM

## 2020-08-15 DIAGNOSIS — Z87891 Personal history of nicotine dependence: Secondary | ICD-10-CM | POA: Insufficient documentation

## 2020-08-15 DIAGNOSIS — M545 Low back pain, unspecified: Secondary | ICD-10-CM | POA: Diagnosis present

## 2020-08-15 DIAGNOSIS — R161 Splenomegaly, not elsewhere classified: Secondary | ICD-10-CM | POA: Diagnosis not present

## 2020-08-15 DIAGNOSIS — Z3A33 33 weeks gestation of pregnancy: Secondary | ICD-10-CM | POA: Diagnosis not present

## 2020-08-15 DIAGNOSIS — R82998 Other abnormal findings in urine: Secondary | ICD-10-CM | POA: Diagnosis not present

## 2020-08-15 DIAGNOSIS — R109 Unspecified abdominal pain: Secondary | ICD-10-CM | POA: Insufficient documentation

## 2020-08-15 DIAGNOSIS — O4703 False labor before 37 completed weeks of gestation, third trimester: Secondary | ICD-10-CM | POA: Diagnosis not present

## 2020-08-15 DIAGNOSIS — R1012 Left upper quadrant pain: Secondary | ICD-10-CM | POA: Insufficient documentation

## 2020-08-15 DIAGNOSIS — G8929 Other chronic pain: Secondary | ICD-10-CM

## 2020-08-15 DIAGNOSIS — O26893 Other specified pregnancy related conditions, third trimester: Secondary | ICD-10-CM | POA: Diagnosis not present

## 2020-08-15 DIAGNOSIS — Z9181 History of falling: Secondary | ICD-10-CM | POA: Diagnosis not present

## 2020-08-15 DIAGNOSIS — Z3A31 31 weeks gestation of pregnancy: Secondary | ICD-10-CM

## 2020-08-15 DIAGNOSIS — O99891 Other specified diseases and conditions complicating pregnancy: Secondary | ICD-10-CM | POA: Diagnosis not present

## 2020-08-15 NOTE — MAU Provider Note (Signed)
Chief Complaint:  Abdominal Pain   Event Date/Time   First Provider Initiated Contact with Patient 08/15/20 2338     HPI: Colleen Valentine is a 25 y.o. [id number] at 47w2dwho presents to maternity admissions reporting pain in Left flank and LUQ, worse when she takes a deep breath.  Also having severe low back pain.  Was seen about 12 hours ago in ED in [place], [place].  They did a full workup and discharged her home.  They did a renal US that was normal but noted an enlarged spleen (also seen on CT in 2019).  No contractions or OB concerns.  They suspected a bulging lumbar disk and ordered an MRI but pt could not tolerate lying flat.  They did full complement of blood tests which were normal  . She reports good fetal movement, denies LOF, vaginal bleeding, vaginal itching/burning, urinary symptoms, h/a, dizziness, n/v, diarrhea, constipation or fever/chills.    She was seen her by me on 3/17 reporting ROM and "baby coming out", but it turned out to be lumbar muscle spasm.  She has been seen since then in the ED on 08/13/20 with back pain and reported falls due to the pain, discharged home  Has been followed for OB in [place] but plans transfer to [facility] next week. ,    Back Pain This is a recurrent problem. The current episode started 1 to 4 weeks ago. The problem occurs constantly. The problem is unchanged. Pain location: Left flank and LUQ. The quality of the pain is described as cramping, shooting and aching. The pain does not radiate. The pain is the same all the time. Associated symptoms include abdominal pain (LUQ) and numbness (intermittent). Pertinent negatives include no dysuria, fever or headaches. She has tried analgesics for the symptoms. The treatment provided no relief.    RN Note: Pt reports she has pain when she breathes in. Stated she has an enlarged spleen and has been having pain from than on her left sideC/o back pain as well. Reports a small amount of brown vag discharge.   Past Medical  History: Past Medical History:  Diagnosis Date  . Acute pyelonephritis 05/17/2018  . Anemia   . Anxiety   . Complication of anesthesia   . Depression   . GERD (gastroesophageal reflux disease)   . Herpes genitalia   . Insomnia   . Obesity   . Polysubstance abuse ([place]) 04/01/2011  . PONV (postoperative nausea and vomiting)   . Seizures ([place])    once in 2016 due to alcohol poisioning  . Suicide ([place])     Past obstetric history: OB History  Gravida Para Term Preterm AB Living  5 1 1   3 1   SAB IAB Ectopic Multiple Live Births  2 1     1     # Outcome Date GA Lbr Len/2nd Weight Sex Delivery Anes PTL Lv  5 Current           4 SAB 2019          3 SAB 2015          2 IAB           1 Term      Vag-Spont   LIV    Past Surgical History: Past Surgical History:  Procedure Laterality Date  . CHOLECYSTECTOMY  2015  . COSMETIC SURGERY  Age 69   dog bite to face    Family History: Family History  Problem Relation Age of Onset  .  Alcohol abuse Mother   . Stroke Mother   . Bipolar disorder Mother   . Drug abuse Mother   . Alcohol abuse Father   . Bipolar disorder Father   . Drug abuse Father   . Colon cancer Maternal Grandfather   . Crohn's disease Paternal Grandmother   . Crohn's disease Paternal Aunt   . Crohn's disease Paternal Aunt   . Crohn's disease Paternal Uncle   . Colon cancer Paternal Uncle   . Cancer Maternal Aunt        "stomach cancer"  . Celiac disease Neg Hx     Social History: Social History   Tobacco Use  . Smoking status: Former Smoker    Packs/day: 0.50    Years: 5.00    Pack years: 2.50    Types: Cigarettes    Quit date: 11/24/2019    Years since quitting: 0.7  . Smokeless tobacco: Never Used  . Tobacco comment: one pack cigarettes daily  Vaping Use  . Vaping Use: Some days  Substance Use Topics  . Alcohol use: Not Currently    Alcohol/week: 0.0 standard drinks    Comment: social  . Drug use: Yes    Types: Marijuana    Comment: SMOKED  TODAY    Allergies: No Known Allergies  Meds:  Medications Prior to Admission  Medication Sig Dispense Refill Last Dose  . acetaminophen (TYLENOL) 500 MG tablet Take 1,000 mg by mouth every 6 (six) hours as needed for mild pain or headache.     . cetirizine (ZYRTEC) 10 MG tablet Take 1 tablet (10 mg total) by mouth daily. 30 tablet 0   . cyclobenzaprine (FLEXERIL) 10 MG tablet Take 1 tablet (10 mg total) by mouth 3 (three) times daily as needed for muscle spasms. 30 tablet 0   . fluticasone (FLONASE) 50 MCG/ACT nasal spray Place 2 sprays into both nostrils daily. 16 g 0   . hydrOXYzine (VISTARIL) 25 MG capsule Take 1 capsule (25 mg total) by mouth daily as needed for anxiety. 30 capsule 1   . lamoTRIgine (LAMICTAL) 25 MG tablet Take 25 mg by mouth 2 (two) times daily.     Marland Kitchen omeprazole (PRILOSEC) 20 MG capsule Take 20 mg by mouth daily.     . traMADol (ULTRAM) 50 MG tablet Take 1 tablet (50 mg total) by mouth every 6 (six) hours as needed for severe pain. 10 tablet 0   . valACYclovir (VALTREX) 500 MG tablet Take 500 mg by mouth daily.       I have reviewed patient's Past Medical Hx, Surgical Hx, Family Hx, Social Hx, medications and allergies.   ROS:  Review of Systems  Constitutional: Negative for fever.  Gastrointestinal: Positive for abdominal pain (LUQ).  Genitourinary: Negative for dysuria.  Musculoskeletal: Positive for back pain.  Neurological: Positive for numbness (intermittent). Negative for headaches.   Other systems negative  Physical Exam   Patient Vitals for the past 24 hrs:  BP Pulse Resp SpO2  08/15/20 2323 107/60 (!) 106 20 99 %   Constitutional: Well-developed, well-nourished female in no acute distress.  Cardiovascular: normal rate and rhythm Respiratory: normal effort, clear to auscultation bilaterally GI: Abd soft, non-tender, gravid appropriate for gestational age.   No rebound or guarding. MS: Extremities nontender, no edema, normal ROM Neurologic:  Alert and oriented x 4.  GU: Neg CVAT.  PELVIC EXAM: deferred FHT:  Baseline 145 , moderate variability, small accelerations present, no decelerations (has had several doses of narcotics in  other ED and one tablet here) Contractions:   Rare   Labs: Results for orders placed or performed during the hospital encounter of 08/15/20 (from the past 24 hour(s))  Urinalysis, Routine w reflex microscopic Urine, Clean Catch     Status: Abnormal   Collection Time: 08/15/20 11:25 PM  Result Value Ref Range   Color, Urine AMBER (A) YELLOW   APPearance HAZY (A) CLEAR   Specific Gravity, Urine 1.025 1.005 - 1.030   pH 5.0 5.0 - 8.0   Glucose, UA 150 (A) NEGATIVE mg/dL   Hgb urine dipstick NEGATIVE NEGATIVE   Bilirubin Urine NEGATIVE NEGATIVE   Ketones, ur NEGATIVE NEGATIVE mg/dL   Protein, ur 30 (A) NEGATIVE mg/dL   Nitrite NEGATIVE NEGATIVE   Leukocytes,Ua SMALL (A) NEGATIVE   RBC / HPF 0-5 0 - 5 RBC/hpf   WBC, UA 0-5 0 - 5 WBC/hpf   Bacteria, UA RARE (A) NONE SEEN   Squamous Epithelial / LPF 6-10 0 - 5   Mucus PRESENT    Ca Oxalate Crys, UA PRESENT   CBC with Differential/Platelet     Status: Abnormal (Preliminary result)   Collection Time: 08/16/20 12:29 AM  Result Value Ref Range   WBC 9.1 4.0 - 10.5 K/uL   RBC 3.22 (L) 3.87 - 5.11 MIL/uL   Hemoglobin 9.7 (L) 12.0 - 15.0 g/dL   HCT 29.4 (L) 36.0 - 46.0 %   MCV 91.3 80.0 - 100.0 fL   MCH 30.1 26.0 - 34.0 pg   MCHC 33.0 30.0 - 36.0 g/dL   RDW 16.2 (H) 11.5 - 15.5 %   Platelets 221 150 - 400 K/uL   nRBC 0.0 0.0 - 0.2 %   Neutrophils Relative % PENDING %   Neutro Abs PENDING 1.7 - 7.7 K/uL   Band Neutrophils PENDING %   Lymphocytes Relative PENDING %   Lymphs Abs PENDING 0.7 - 4.0 K/uL   Monocytes Relative PENDING %   Monocytes Absolute PENDING 0.1 - 1.0 K/uL   Eosinophils Relative PENDING %   Eosinophils Absolute PENDING 0.0 - 0.5 K/uL   Basophils Relative PENDING %   Basophils Absolute PENDING 0.0 - 0.1 K/uL   WBC Morphology  PENDING    RBC Morphology PENDING    Smear Review PENDING    Other PENDING %   nRBC PENDING 0 /100 WBC   Metamyelocytes Relative PENDING %   Myelocytes PENDING %   Promyelocytes Relative PENDING %   Blasts PENDING %   Immature Granulocytes PENDING %   Abs Immature Granulocytes PENDING 0.00 - 0.07 K/uL  Comprehensive metabolic panel     Status: Abnormal   Collection Time: 08/16/20 12:29 AM  Result Value Ref Range   Sodium 134 (L) 135 - 145 mmol/L   Potassium 3.9 3.5 - 5.1 mmol/L   Chloride 104 98 - 111 mmol/L   CO2 23 22 - 32 mmol/L   Glucose, Bld 94 70 - 99 mg/dL   BUN 10 6 - 20 mg/dL   Creatinine, Ser 0.58 0.44 - 1.00 mg/dL   Calcium 9.1 8.9 - 10.3 mg/dL   Total Protein 6.8 6.5 - 8.1 g/dL   Albumin 2.5 (L) 3.5 - 5.0 g/dL   AST 10 (L) 15 - 41 U/L   ALT 9 0 - 44 U/L   Alkaline Phosphatase 116 38 - 126 U/L   Total Bilirubin 1.2 0.3 - 1.2 mg/dL   GFR, Estimated >60 >60 mL/min   Anion gap 7 5 - 15    --/--/  O POS (03/09 1552)  Imaging:    MAU Course/MDM: I have ordered labs and reviewed results. These are within normal limits except for small leukocytes in urine, sent for culture.  Patient is adamant that she has a UTI.  NST reviewed, reassuring for history of narcotics in system.  Consult Dr Roselie Awkward with presentation, exam findings and test results. Pain could be related to UTI or could be related to splenomegaly, though chart review shows she has had this for at least several years.  Treatments in MAU included [place] tablet Percocet. .    Assessment: Single IUP at [redacted]w[redacted]d Left flank pain Leukocytes in urine Low back pain Splenomegaly  Plan: Discharge home Rx Keflex for possible UTI (initial dose given here) Pt has rx for Flexeril at home (one tab given here) Recommend she ask for referral to Ortho to eval low back pain. Preterm Labor precautions and fetal kick counts Follow up in Office for prenatal visits   Encouraged to return if she develops worsening of  symptoms, increase in pain, fever, or other concerning symptoms.   Pt stable at time of discharge.  Hansel Feinstein CNM, MSN Certified Nurse-Midwife 08/15/2020 11:38 PM

## 2020-08-15 NOTE — MAU Note (Signed)
Pt reports she has pain when she breathes in. Stated she has an enlarged spleen and has been having pain from than on her left sideC/o back pain as well. Reports a small amount of brown vag discharge.

## 2020-08-16 ENCOUNTER — Telehealth: Payer: Self-pay | Admitting: *Deleted

## 2020-08-16 ENCOUNTER — Other Ambulatory Visit: Payer: Self-pay

## 2020-08-16 DIAGNOSIS — R161 Splenomegaly, not elsewhere classified: Secondary | ICD-10-CM | POA: Diagnosis present

## 2020-08-16 DIAGNOSIS — M545 Low back pain, unspecified: Secondary | ICD-10-CM | POA: Diagnosis present

## 2020-08-16 LAB — CBC WITH DIFFERENTIAL/PLATELET
Abs Immature Granulocytes: 0.09 10*3/uL — ABNORMAL HIGH (ref 0.00–0.07)
Basophils Absolute: 0 10*3/uL (ref 0.0–0.1)
Basophils Relative: 0 %
Eosinophils Absolute: 0.1 10*3/uL (ref 0.0–0.5)
Eosinophils Relative: 1 %
HCT: 29.4 % — ABNORMAL LOW (ref 36.0–46.0)
Hemoglobin: 9.7 g/dL — ABNORMAL LOW (ref 12.0–15.0)
Immature Granulocytes: 1 %
Lymphocytes Relative: 17 %
Lymphs Abs: 1.5 10*3/uL (ref 0.7–4.0)
MCH: 30.1 pg (ref 26.0–34.0)
MCHC: 33 g/dL (ref 30.0–36.0)
MCV: 91.3 fL (ref 80.0–100.0)
Monocytes Absolute: 0.6 10*3/uL (ref 0.1–1.0)
Monocytes Relative: 7 %
Neutro Abs: 6.7 10*3/uL (ref 1.7–7.7)
Neutrophils Relative %: 74 %
Platelets: 221 10*3/uL (ref 150–400)
RBC: 3.22 MIL/uL — ABNORMAL LOW (ref 3.87–5.11)
RDW: 16.2 % — ABNORMAL HIGH (ref 11.5–15.5)
WBC: 9.1 10*3/uL (ref 4.0–10.5)
nRBC: 0 % (ref 0.0–0.2)

## 2020-08-16 LAB — COMPREHENSIVE METABOLIC PANEL
ALT: 9 U/L (ref 0–44)
AST: 10 U/L — ABNORMAL LOW (ref 15–41)
Albumin: 2.5 g/dL — ABNORMAL LOW (ref 3.5–5.0)
Alkaline Phosphatase: 116 U/L (ref 38–126)
Anion gap: 7 (ref 5–15)
BUN: 10 mg/dL (ref 6–20)
CO2: 23 mmol/L (ref 22–32)
Calcium: 9.1 mg/dL (ref 8.9–10.3)
Chloride: 104 mmol/L (ref 98–111)
Creatinine, Ser: 0.58 mg/dL (ref 0.44–1.00)
GFR, Estimated: >60 mL/min (ref >60)
Glucose, Bld: 94 mg/dL (ref 70–99)
Potassium: 3.9 mmol/L (ref 3.5–5.1)
Sodium: 134 mmol/L — ABNORMAL LOW (ref 135–145)
Total Bilirubin: 1.2 mg/dL (ref 0.3–1.2)
Total Protein: 6.8 g/dL (ref 6.5–8.1)

## 2020-08-16 LAB — URINALYSIS, ROUTINE W REFLEX MICROSCOPIC
Bilirubin Urine: NEGATIVE
Glucose, UA: 150 mg/dL — AB
Hgb urine dipstick: NEGATIVE
Ketones, ur: NEGATIVE mg/dL
Nitrite: NEGATIVE
Protein, ur: 30 mg/dL — AB
Specific Gravity, Urine: 1.025 (ref 1.005–1.030)
pH: 5 (ref 5.0–8.0)

## 2020-08-16 LAB — SAVE SMEAR(SSMR), FOR PROVIDER SLIDE REVIEW

## 2020-08-16 MED ORDER — OXYCODONE-ACETAMINOPHEN 5-325 MG PO TABS
1.0000 | ORAL_TABLET | Freq: Once | ORAL | Status: AC
  Administered 2020-08-16: 1 via ORAL
  Filled 2020-08-16: qty 1

## 2020-08-16 MED ORDER — CEPHALEXIN 500 MG PO CAPS
500.0000 mg | ORAL_CAPSULE | Freq: Once | ORAL | Status: AC
  Administered 2020-08-16: 500 mg via ORAL
  Filled 2020-08-16: qty 1

## 2020-08-16 MED ORDER — CEPHALEXIN 500 MG PO CAPS: 500.0000 mg | ORAL_CAPSULE | Freq: Four times a day (QID) | ORAL | 0 refills | Status: DC

## 2020-08-16 MED ORDER — CYCLOBENZAPRINE HCL 5 MG PO TABS
10.0000 mg | ORAL_TABLET | Freq: Once | ORAL | Status: AC
  Administered 2020-08-16: 10 mg via ORAL
  Filled 2020-08-16: qty 2

## 2020-08-16 NOTE — Discharge Instructions (Signed)
Pregnancy and Urinary Tract Infection  A urinary tract infection (UTI) is an infection of any part of the urinary tract. This includes the kidneys, the tubes that connect your kidneys to your bladder (ureters), the bladder, and the tube that carries urine out of your body (urethra). These organs make, store, and get rid of urine in the body. Your health care provider may use other names to describe the infection. An upper UTI affects the ureters and kidneys (pyelonephritis). A lower UTI affects the bladder (cystitis) and urethra (urethritis). Most urinary tract infections are caused by bacteria in your genital area, around the entrance to your urinary tract (urethra). These bacteria grow and cause irritation and inflammation of your urinary tract. You are more likely to develop a UTI during pregnancy because the physical and hormonal changes your body goes through can make it easier for bacteria to get into your urinary tract. Your growing baby also puts pressure on your bladder and can affect urine flow. It is important to recognize and treat UTIs in pregnancy because of the risk of serious complications for both you and your baby. How does this affect me? Symptoms of a UTI include:  Needing to urinate right away (urgently).  Frequent urination or passing small amounts of urine frequently.  Pain or burning with urination.  Blood in the urine.  Urine that smells bad or unusual.  Trouble urinating.  Cloudy urine.  Pain in the abdomen or lower back.  Vaginal discharge. You may also have:  Vomiting or a decreased appetite.  Confusion.  Irritability or tiredness.  A fever.  Diarrhea. How does this affect my baby? An untreated UTI during pregnancy could lead to a kidney infection or a systemic infection, which can cause health problems that could affect your baby. Possible complications of an untreated UTI include:  Giving birth to your baby before 37 weeks of pregnancy  (premature).  Having a baby with a low birth weight.  Developing high blood pressure during pregnancy (preeclampsia).  Having a low hemoglobin level (anemia). What can I do to lower my risk? To prevent a UTI:  Go to the bathroom as soon as you feel the need. Do not hold urine for long periods of time.  Always wipe from front to back, especially after a bowel movement. Use each tissue one time when you wipe.  Empty your bladder after sex.  Keep your genital area dry.  Drink 6-10 glasses of water each day.  Do not douche or use deodorant sprays. How is this treated? Treatment for this condition may include:  Antibiotic medicines that are safe to take during pregnancy.  Other medicines to treat less common causes of UTI. Follow these instructions at home:  If you were prescribed an antibiotic medicine, take it as told by your health care provider. Do not stop using the antibiotic even if you start to feel better.  Keep all follow-up visits as told by your health care provider. This is important. Contact a health care provider if:  Your symptoms do not improve or they get worse.  You have abnormal vaginal discharge. Get help right away if you:  Have a fever.  Have nausea and vomiting.  Have back or side pain.  Feel contractions in your uterus.  Have lower belly pain.  Have a gush of fluid from your vagina.  Have blood in your urine. Summary  A urinary tract infection (UTI) is an infection of any part of the urinary tract, which includes the  kidneys, ureters, bladder, and urethra.  Most urinary tract infections are caused by bacteria in your genital area, around the entrance to your urinary tract (urethra).  You are more likely to develop a UTI during pregnancy.  If you were prescribed an antibiotic medicine, take it as told by your health care provider. Do not stop using the antibiotic even if you start to feel better. This information is not intended to  replace advice given to you by your health care provider. Make sure you discuss any questions you have with your health care provider. Document Revised: 09/04/2018 Document Reviewed: 04/16/2018 Elsevier Patient Education  2021 [place]. Acute Back Pain, Adult Acute back pain is sudden and usually short-lived. It is often caused by an injury to the muscles and tissues in the back. The injury may result from:  A muscle or ligament getting overstretched or torn (strained). Ligaments are tissues that connect bones to each other. Lifting something improperly can cause a back strain.  Wear and tear (degeneration) of the spinal disks. Spinal disks are circular tissue that provide cushioning between the bones of the spine (vertebrae).  Twisting motions, such as while playing sports or doing yard work.  A hit to the back.  Arthritis. You may have a physical exam, lab tests, and imaging tests to find the cause of your pain. Acute back pain usually goes away with rest and home care. Follow these instructions at home: Managing pain, stiffness, and swelling  Treatment may include medicines for pain and inflammation that are taken by mouth or applied to the skin, prescription pain medicine, or muscle relaxants. Take over-the-counter and prescription medicines only as told by your health care provider.  Your health care provider may recommend applying ice during the first 24-48 hours after your pain starts. To do this: ? Put ice in a plastic bag. ? Place a towel between your skin and the bag. ? Leave the ice on for 20 minutes, 2-3 times a day.  If directed, apply heat to the affected area as often as told by your health care provider. Use the heat source that your health care provider recommends, such as a moist heat pack or a heating pad. ? Place a towel between your skin and the heat source. ? Leave the heat on for 20-30 minutes. ? Remove the heat if your skin turns bright red. This is  especially important if you are unable to feel pain, heat, or cold. You have a greater risk of getting burned. Activity  Do not stay in bed. Staying in bed for more than 1-2 days can delay your recovery.  Sit up and stand up straight. Avoid leaning forward when you sit or hunching over when you stand. ? If you work at a desk, sit close to it so you do not need to lean over. Keep your chin tucked in. Keep your neck drawn back, and keep your elbows bent at a 90-degree angle (right angle). ? Sit high and close to the steering wheel when you drive. Add lower back (lumbar) support to your car seat, if needed.  Take short walks on even surfaces as soon as you are able. Try to increase the length of time you walk each day.  Do not sit, drive, or stand in one place for more than 30 minutes at a time. Sitting or standing for long periods of time can put stress on your back.  Do not drive or use heavy machinery while taking prescription pain medicine.  Use proper lifting techniques. When you bend and lift, use positions that put less stress on your back: ? [place] your knees. ? Keep the load close to your body. ? Avoid twisting.  Exercise regularly as told by your health care provider. Exercising helps your back heal faster and helps prevent back injuries by keeping muscles strong and flexible.  Work with a physical therapist to make a safe exercise program, as recommended by your health care provider. Do any exercises as told by your physical therapist.   Lifestyle  Maintain a healthy weight. Extra weight puts stress on your back and makes it difficult to have good posture.  Avoid activities or situations that make you feel anxious or stressed. Stress and anxiety increase muscle tension and can make back pain worse. Learn ways to manage anxiety and stress, such as through exercise. General instructions  Sleep on a firm mattress in a comfortable position. Try lying on your side with your knees  slightly bent. If you lie on your back, put a pillow under your knees.  Follow your treatment plan as told by your health care provider. This may include: ? Cognitive or behavioral therapy. ? Acupuncture or massage therapy. ? Meditation or yoga. Contact a health care provider if:  You have pain that is not relieved with rest or medicine.  You have increasing pain going down into your legs or buttocks.  Your pain does not improve after 2 weeks.  You have pain at night.  You lose weight without trying.  You have a fever or chills. Get help right away if:  You develop new bowel or bladder control problems.  You have unusual weakness or numbness in your arms or legs.  You develop nausea or vomiting.  You develop abdominal pain.  You feel faint. Summary  Acute back pain is sudden and usually short-lived.  Use proper lifting techniques. When you bend and lift, use positions that put less stress on your back.  Take over-the-counter and prescription medicines and apply heat or ice as directed by your health care provider. This information is not intended to replace advice given to you by your health care provider. Make sure you discuss any questions you have with your health care provider. Document Revised: 02/04/2020 Document Reviewed: 02/04/2020 Elsevier Patient Education  2021 [employer].

## 2020-08-16 NOTE — Telephone Encounter (Signed)
Transition Care Management Follow-up Telephone Call  Date of discharge and from where: 08/15/2020 [facility]  How have you been since you were released from the hospital? "Still in a lot of pain"  Any questions or concerns? No  Items Reviewed:  Did the pt receive and understand the discharge instructions provided? Yes   Medications obtained and verified? Yes   Other? No   Any new allergies since your discharge? No   Dietary orders reviewed? No  Do you have support at home? Yes    Functional Questionnaire: (I = Independent and D = Dependent) ADLs: I  Bathing/Dressing- I  Meal Prep- I  Eating- I  Maintaining continence- I  Transferring/Ambulation- I  Managing Meds- I  Follow up appointments reviewed:   PCP Hospital f/u appt confirmed? No    Specialist Hospital f/u appt confirmed? Yes  Scheduled to see Larkin Ina on 08/22/2020 @ 03:10pm            Are transportation arrangements needed? No   If their condition worsens, is the pt aware to call PCP or go to the Emergency Dept.? Yes  Was the patient provided with contact information for the PCP's office or ED? Yes  Was to pt encouraged to call back with questions or concerns? Yes

## 2020-08-17 ENCOUNTER — Encounter: Payer: Self-pay | Admitting: Advanced Practice Midwife

## 2020-08-17 LAB — CULTURE, OB URINE: Culture: <10000 — AB

## 2020-08-18 DIAGNOSIS — Z3493 Encounter for supervision of normal pregnancy, unspecified, third trimester: Secondary | ICD-10-CM | POA: Diagnosis not present

## 2020-08-21 ENCOUNTER — Encounter (HOSPITAL_COMMUNITY): Payer: Self-pay | Admitting: Obstetrics & Gynecology

## 2020-08-21 ENCOUNTER — Inpatient Hospital Stay (HOSPITAL_COMMUNITY): Payer: Medicaid Other

## 2020-08-21 ENCOUNTER — Inpatient Hospital Stay (HOSPITAL_COMMUNITY)
Admission: AD | Admit: 2020-08-21 | Discharge: 2020-09-03 | DRG: 786 | Disposition: A | Discharge status: Home / Self Care | Payer: Medicaid Other | Attending: Obstetrics and Gynecology | Admitting: Obstetrics and Gynecology

## 2020-08-21 ENCOUNTER — Other Ambulatory Visit: Payer: Self-pay

## 2020-08-21 DIAGNOSIS — Z20822 Contact with and (suspected) exposure to covid-19: Secondary | ICD-10-CM | POA: Diagnosis present

## 2020-08-21 DIAGNOSIS — A6000 Herpesviral infection of urogenital system, unspecified: Secondary | ICD-10-CM | POA: Diagnosis present

## 2020-08-21 DIAGNOSIS — M4624 Osteomyelitis of vertebra, thoracic region: Secondary | ICD-10-CM | POA: Diagnosis present

## 2020-08-21 DIAGNOSIS — F1290 Cannabis use, unspecified, uncomplicated: Secondary | ICD-10-CM | POA: Diagnosis not present

## 2020-08-21 DIAGNOSIS — O9902 Anemia complicating childbirth: Secondary | ICD-10-CM | POA: Diagnosis not present

## 2020-08-21 DIAGNOSIS — B9562 Methicillin resistant Staphylococcus aureus infection as the cause of diseases classified elsewhere: Secondary | ICD-10-CM

## 2020-08-21 DIAGNOSIS — M4625 Osteomyelitis of vertebra, thoracolumbar region: Secondary | ICD-10-CM | POA: Diagnosis not present

## 2020-08-21 DIAGNOSIS — O99214 Obesity complicating childbirth: Secondary | ICD-10-CM | POA: Diagnosis present

## 2020-08-21 DIAGNOSIS — Z6841 Body Mass Index (BMI) 40.0 and over, adult: Secondary | ICD-10-CM

## 2020-08-21 DIAGNOSIS — R7881 Bacteremia: Secondary | ICD-10-CM | POA: Diagnosis not present

## 2020-08-21 DIAGNOSIS — O24419 Gestational diabetes mellitus in pregnancy, unspecified control: Secondary | ICD-10-CM | POA: Diagnosis present

## 2020-08-21 DIAGNOSIS — G9520 Unspecified cord compression: Secondary | ICD-10-CM | POA: Diagnosis present

## 2020-08-21 DIAGNOSIS — O9962 Diseases of the digestive system complicating childbirth: Secondary | ICD-10-CM | POA: Diagnosis not present

## 2020-08-21 DIAGNOSIS — D649 Anemia, unspecified: Secondary | ICD-10-CM | POA: Diagnosis not present

## 2020-08-21 DIAGNOSIS — G959 Disease of spinal cord, unspecified: Secondary | ICD-10-CM | POA: Diagnosis not present

## 2020-08-21 DIAGNOSIS — O4693 Antepartum hemorrhage, unspecified, third trimester: Secondary | ICD-10-CM

## 2020-08-21 DIAGNOSIS — O42013 Preterm premature rupture of membranes, onset of labor within 24 hours of rupture, third trimester: Secondary | ICD-10-CM | POA: Diagnosis not present

## 2020-08-21 DIAGNOSIS — Z87891 Personal history of nicotine dependence: Secondary | ICD-10-CM | POA: Diagnosis not present

## 2020-08-21 DIAGNOSIS — G061 Intraspinal abscess and granuloma: Secondary | ICD-10-CM | POA: Diagnosis not present

## 2020-08-21 DIAGNOSIS — O42913 Preterm premature rupture of membranes, unspecified as to length of time between rupture and onset of labor, third trimester: Secondary | ICD-10-CM | POA: Diagnosis not present

## 2020-08-21 DIAGNOSIS — O9832 Other infections with a predominantly sexual mode of transmission complicating childbirth: Secondary | ICD-10-CM | POA: Diagnosis not present

## 2020-08-21 DIAGNOSIS — M4620 Osteomyelitis of vertebra, site unspecified: Secondary | ICD-10-CM

## 2020-08-21 DIAGNOSIS — M549 Dorsalgia, unspecified: Secondary | ICD-10-CM | POA: Diagnosis not present

## 2020-08-21 DIAGNOSIS — O99892 Other specified diseases and conditions complicating childbirth: Secondary | ICD-10-CM | POA: Diagnosis not present

## 2020-08-21 DIAGNOSIS — O99013 Anemia complicating pregnancy, third trimester: Secondary | ICD-10-CM | POA: Diagnosis not present

## 2020-08-21 DIAGNOSIS — Z7901 Long term (current) use of anticoagulants: Secondary | ICD-10-CM | POA: Diagnosis not present

## 2020-08-21 DIAGNOSIS — O99344 Other mental disorders complicating childbirth: Secondary | ICD-10-CM | POA: Diagnosis present

## 2020-08-21 DIAGNOSIS — Z3A34 34 weeks gestation of pregnancy: Secondary | ICD-10-CM

## 2020-08-21 DIAGNOSIS — O99343 Other mental disorders complicating pregnancy, third trimester: Secondary | ICD-10-CM | POA: Diagnosis not present

## 2020-08-21 DIAGNOSIS — F419 Anxiety disorder, unspecified: Secondary | ICD-10-CM | POA: Diagnosis present

## 2020-08-21 DIAGNOSIS — O24410 Gestational diabetes mellitus in pregnancy, diet controlled: Secondary | ICD-10-CM | POA: Diagnosis not present

## 2020-08-21 DIAGNOSIS — O99354 Diseases of the nervous system complicating childbirth: Secondary | ICD-10-CM | POA: Diagnosis not present

## 2020-08-21 DIAGNOSIS — E66813 Obesity, class 3: Secondary | ICD-10-CM

## 2020-08-21 DIAGNOSIS — O321XX0 Maternal care for breech presentation, not applicable or unspecified: Secondary | ICD-10-CM | POA: Diagnosis not present

## 2020-08-21 DIAGNOSIS — O99891 Other specified diseases and conditions complicating pregnancy: Secondary | ICD-10-CM | POA: Diagnosis not present

## 2020-08-21 DIAGNOSIS — Z8614 Personal history of Methicillin resistant Staphylococcus aureus infection: Secondary | ICD-10-CM | POA: Diagnosis not present

## 2020-08-21 DIAGNOSIS — O26893 Other specified pregnancy related conditions, third trimester: Secondary | ICD-10-CM | POA: Diagnosis not present

## 2020-08-21 DIAGNOSIS — Z3A31 31 weeks gestation of pregnancy: Secondary | ICD-10-CM

## 2020-08-21 DIAGNOSIS — M899 Disorder of bone, unspecified: Secondary | ICD-10-CM | POA: Diagnosis not present

## 2020-08-21 DIAGNOSIS — M545 Low back pain, unspecified: Secondary | ICD-10-CM | POA: Diagnosis not present

## 2020-08-21 DIAGNOSIS — K219 Gastro-esophageal reflux disease without esophagitis: Secondary | ICD-10-CM | POA: Diagnosis present

## 2020-08-21 DIAGNOSIS — G9589 Other specified diseases of spinal cord: Secondary | ICD-10-CM | POA: Diagnosis not present

## 2020-08-21 DIAGNOSIS — G062 Extradural and subdural abscess, unspecified: Principal | ICD-10-CM

## 2020-08-21 DIAGNOSIS — T1490XA Injury, unspecified, initial encounter: Secondary | ICD-10-CM | POA: Diagnosis not present

## 2020-08-21 DIAGNOSIS — O99213 Obesity complicating pregnancy, third trimester: Secondary | ICD-10-CM | POA: Diagnosis not present

## 2020-08-21 DIAGNOSIS — O24420 Gestational diabetes mellitus in childbirth, diet controlled: Secondary | ICD-10-CM | POA: Diagnosis not present

## 2020-08-21 DIAGNOSIS — R531 Weakness: Secondary | ICD-10-CM | POA: Diagnosis not present

## 2020-08-21 DIAGNOSIS — O99324 Drug use complicating childbirth: Secondary | ICD-10-CM | POA: Diagnosis present

## 2020-08-21 DIAGNOSIS — O288 Other abnormal findings on antenatal screening of mother: Secondary | ICD-10-CM

## 2020-08-21 DIAGNOSIS — W19XXXA Unspecified fall, initial encounter: Secondary | ICD-10-CM | POA: Diagnosis not present

## 2020-08-21 DIAGNOSIS — O98813 Other maternal infectious and parasitic diseases complicating pregnancy, third trimester: Secondary | ICD-10-CM | POA: Diagnosis not present

## 2020-08-21 DIAGNOSIS — D491 Neoplasm of unspecified behavior of respiratory system: Secondary | ICD-10-CM | POA: Diagnosis not present

## 2020-08-21 DIAGNOSIS — J9811 Atelectasis: Secondary | ICD-10-CM | POA: Diagnosis not present

## 2020-08-21 DIAGNOSIS — F902 Attention-deficit hyperactivity disorder, combined type: Secondary | ICD-10-CM | POA: Diagnosis not present

## 2020-08-21 DIAGNOSIS — Z3A35 35 weeks gestation of pregnancy: Secondary | ICD-10-CM | POA: Diagnosis not present

## 2020-08-21 DIAGNOSIS — F331 Major depressive disorder, recurrent, moderate: Secondary | ICD-10-CM | POA: Diagnosis not present

## 2020-08-21 DIAGNOSIS — O99353 Diseases of the nervous system complicating pregnancy, third trimester: Secondary | ICD-10-CM | POA: Diagnosis not present

## 2020-08-21 DIAGNOSIS — F32A Depression, unspecified: Secondary | ICD-10-CM | POA: Diagnosis present

## 2020-08-21 DIAGNOSIS — F413 Other mixed anxiety disorders: Secondary | ICD-10-CM | POA: Diagnosis not present

## 2020-08-21 DIAGNOSIS — Z363 Encounter for antenatal screening for malformations: Secondary | ICD-10-CM | POA: Diagnosis not present

## 2020-08-21 LAB — RESP PANEL BY RT-PCR (FLU A&B, COVID) ARPGX2
Influenza A by PCR: NEGATIVE
Influenza B by PCR: NEGATIVE
SARS Coronavirus 2 by RT PCR: NEGATIVE

## 2020-08-21 LAB — COMPREHENSIVE METABOLIC PANEL
ALT: 9 U/L (ref 0–44)
AST: 10 U/L — ABNORMAL LOW (ref 15–41)
Albumin: 2.4 g/dL — ABNORMAL LOW (ref 3.5–5.0)
Alkaline Phosphatase: 128 U/L — ABNORMAL HIGH (ref 38–126)
Anion gap: 9 (ref 5–15)
BUN: 7 mg/dL (ref 6–20)
CO2: 25 mmol/L (ref 22–32)
Calcium: 9.1 mg/dL (ref 8.9–10.3)
Chloride: 100 mmol/L (ref 98–111)
Creatinine, Ser: 0.6 mg/dL (ref 0.44–1.00)
GFR, Estimated: >60 mL/min (ref >60)
Glucose, Bld: 102 mg/dL — ABNORMAL HIGH (ref 70–99)
Potassium: 3.6 mmol/L (ref 3.5–5.1)
Sodium: 134 mmol/L — ABNORMAL LOW (ref 135–145)
Total Bilirubin: 0.8 mg/dL (ref 0.3–1.2)
Total Protein: 6.6 g/dL (ref 6.5–8.1)

## 2020-08-21 LAB — URINALYSIS, ROUTINE W REFLEX MICROSCOPIC
Bacteria, UA: NONE SEEN
Bilirubin Urine: NEGATIVE
Glucose, UA: NEGATIVE mg/dL
Hgb urine dipstick: NEGATIVE
Ketones, ur: 20 mg/dL — AB
Nitrite: NEGATIVE
Protein, ur: NEGATIVE mg/dL
Specific Gravity, Urine: 1.019 (ref 1.005–1.030)
pH: 6 (ref 5.0–8.0)

## 2020-08-21 LAB — CBC
HCT: 31.9 % — ABNORMAL LOW (ref 36.0–46.0)
Hemoglobin: 10.4 g/dL — ABNORMAL LOW (ref 12.0–15.0)
MCH: 29.3 pg (ref 26.0–34.0)
MCHC: 32.6 g/dL (ref 30.0–36.0)
MCV: 89.9 fL (ref 80.0–100.0)
Platelets: 294 10*3/uL (ref 150–400)
RBC: 3.55 MIL/uL — ABNORMAL LOW (ref 3.87–5.11)
RDW: 15.6 % — ABNORMAL HIGH (ref 11.5–15.5)
WBC: 10.8 10*3/uL — ABNORMAL HIGH (ref 4.0–10.5)
nRBC: 0.3 % — ABNORMAL HIGH (ref 0.0–0.2)

## 2020-08-21 LAB — D-DIMER, QUANTITATIVE: D-Dimer, Quant: 1.24 ug/mL-FEU — ABNORMAL HIGH (ref 0.00–0.50)

## 2020-08-21 MED ORDER — HYDROMORPHONE HCL 1 MG/ML IJ SOLN
0.5000 mg | Freq: Once | INTRAMUSCULAR | Status: AC
  Administered 2020-08-21: 0.5 mg via INTRAVENOUS
  Filled 2020-08-21: qty 1

## 2020-08-21 MED ORDER — BISACODYL 5 MG PO TBEC
10.0000 mg | DELAYED_RELEASE_TABLET | Freq: Once | ORAL | Status: AC
  Administered 2020-08-21: 10 mg via ORAL
  Filled 2020-08-21: qty 2

## 2020-08-21 MED ORDER — HYDROMORPHONE HCL 1 MG/ML IJ SOLN
1.0000 mg | INTRAMUSCULAR | Status: DC | PRN
  Administered 2020-08-21 – 2020-08-30 (×34): 1 mg via INTRAVENOUS
  Filled 2020-08-21 (×36): qty 1

## 2020-08-21 MED ORDER — HYDROXYZINE HCL 25 MG PO TABS
25.0000 mg | ORAL_TABLET | Freq: Every day | ORAL | Status: DC | PRN
  Filled 2020-08-21: qty 1

## 2020-08-21 MED ORDER — HYDROMORPHONE HCL 1 MG/ML IJ SOLN: 1.0000 mg | Freq: Once | INTRAMUSCULAR | Status: AC

## 2020-08-21 MED ORDER — DOCUSATE SODIUM 100 MG PO CAPS
100.0000 mg | ORAL_CAPSULE | Freq: Every day | ORAL | Status: DC
  Administered 2020-08-22: 100 mg via ORAL
  Filled 2020-08-21: qty 1

## 2020-08-21 MED ORDER — HYDROMORPHONE HCL 1 MG/ML IJ SOLN
INTRAMUSCULAR | Status: AC
  Administered 2020-08-21: 1 mg via INTRAVENOUS
  Filled 2020-08-21: qty 1

## 2020-08-21 MED ORDER — CYCLOBENZAPRINE HCL 5 MG PO TABS
10.0000 mg | ORAL_TABLET | Freq: Three times a day (TID) | ORAL | Status: DC | PRN
  Administered 2020-08-22 – 2020-08-25 (×7): 10 mg via ORAL
  Filled 2020-08-21 (×7): qty 1

## 2020-08-21 MED ORDER — ACETAMINOPHEN 325 MG PO TABS: 650.0000 mg | ORAL_TABLET | ORAL | Status: DC | PRN

## 2020-08-21 MED ORDER — ZOLPIDEM TARTRATE 5 MG PO TABS
5.0000 mg | ORAL_TABLET | Freq: Every evening | ORAL | Status: DC | PRN
Start: 2020-08-21 — End: 2020-08-30
  Administered 2020-08-21: 5 mg via ORAL
  Filled 2020-08-21: qty 1

## 2020-08-21 MED ORDER — LORAZEPAM 2 MG/ML IJ SOLN
0.5000 mg | Freq: Once | INTRAMUSCULAR | Status: AC
  Administered 2020-08-21: 0.5 mg via INTRAVENOUS
  Filled 2020-08-21: qty 1

## 2020-08-21 MED ORDER — PANTOPRAZOLE SODIUM 40 MG PO TBEC
40.0000 mg | DELAYED_RELEASE_TABLET | Freq: Every day | ORAL | Status: DC
  Administered 2020-08-22 – 2020-09-03 (×12): 40 mg via ORAL
  Filled 2020-08-21 (×12): qty 1

## 2020-08-21 MED ORDER — VALACYCLOVIR HCL 500 MG PO TABS
500.0000 mg | ORAL_TABLET | Freq: Every day | ORAL | Status: DC
  Administered 2020-08-22: 500 mg via ORAL
  Filled 2020-08-21: qty 1

## 2020-08-21 MED ORDER — FLEET ENEMA 7-19 GM/118ML RE ENEM
1.0000 | ENEMA | Freq: Once | RECTAL | Status: AC
  Administered 2020-08-21: 1 via RECTAL

## 2020-08-21 MED ORDER — PRENATAL MULTIVITAMIN CH
1.0000 | ORAL_TABLET | Freq: Every day | ORAL | Status: DC
  Administered 2020-08-22 – 2020-08-29 (×8): 1 via ORAL
  Filled 2020-08-21 (×8): qty 1

## 2020-08-21 MED ORDER — ACETAMINOPHEN 325 MG PO TABS
650.0000 mg | ORAL_TABLET | ORAL | Status: DC | PRN
  Administered 2020-08-21 – 2020-08-22 (×4): 650 mg via ORAL
  Filled 2020-08-21 (×4): qty 2

## 2020-08-21 MED ORDER — CALCIUM CARBONATE ANTACID 500 MG PO CHEW
2.0000 | CHEWABLE_TABLET | ORAL | Status: DC | PRN
  Administered 2020-08-22 – 2020-08-31 (×3): 400 mg via ORAL
  Filled 2020-08-21 (×3): qty 2

## 2020-08-21 MED ORDER — LAMOTRIGINE 25 MG PO TABS
25.0000 mg | ORAL_TABLET | Freq: Two times a day (BID) | ORAL | Status: DC
  Administered 2020-08-21 – 2020-09-03 (×27): 25 mg via ORAL
  Filled 2020-08-21 (×29): qty 1

## 2020-08-21 NOTE — MAU Note (Signed)
Pt back from MRI able to tolerate procedure.. Assited to BR and back to bed.Back pain a little better

## 2020-08-21 NOTE — MAU Provider Note (Signed)
History     CSN: [id number]  Arrival date and time: 08/21/20 1438   Event Date/Time   First Provider Initiated Contact with Patient 08/21/20 1607      Chief Complaint  Patient presents with  . Abdominal Pain  . Back Pain  . Chest Pain  . Pelvic Pain   HPI  This is 24yo [id number] at [redacted]w[redacted]d. She presents with back pain with increasing leg numbness with decreased strength in her legs. This has been ongoing since 3/5. She has been taking flexeril for her back pain. She was seen in the ED for this on 3/20. They tried to do an MRI, but the patient wasn't able to lay down long enough for the procedure. Since that time, she has continued to have back pain with falls several times a day due to her legs giving out on her. Flexeril hasn't been extremely helpful.   She also complains of constipation for 2 weeks - has not had a stool despite taking 2 caps of miralax twice a day for the past several days. Has also taken colace. No palliating or provoking factors.   OB History    Gravida  5   Para  1   Term  1   Preterm      AB  3   Living  1     SAB  2   IAB  1   Ectopic      Multiple      Live Births  1           Past Medical History:  Diagnosis Date  . Acute pyelonephritis 05/17/2018  . Anemia   . Anxiety   . Complication of anesthesia   . Depression   . GERD (gastroesophageal reflux disease)   . Herpes genitalia   . Insomnia   . Obesity   . Polysubstance abuse ([place]) 04/01/2011  . PONV (postoperative nausea and vomiting)   . Seizures ([place])    once in 2016 due to alcohol poisioning  . Suicide [facility])     Past Surgical History:  Procedure Laterality Date  . CHOLECYSTECTOMY  2015  . COSMETIC SURGERY  Age 50   dog bite to face    Family History  Problem Relation Age of Onset  . Alcohol abuse Mother   . Stroke Mother   . Bipolar disorder Mother   . Drug abuse Mother   . Alcohol abuse Father   . Bipolar disorder Father   . Drug abuse Father   . Colon  cancer Maternal Grandfather   . Crohn's disease Paternal Grandmother   . Crohn's disease Paternal Aunt   . Crohn's disease Paternal Aunt   . Crohn's disease Paternal Uncle   . Colon cancer Paternal Uncle   . Cancer Maternal Aunt        "stomach cancer"  . Celiac disease Neg Hx     Social History   Tobacco Use  . Smoking status: Former Smoker    Packs/day: 0.50    Years: 5.00    Pack years: 2.50    Types: Cigarettes    Quit date: 11/24/2019    Years since quitting: 0.7  . Smokeless tobacco: Never Used  . Tobacco comment: one pack cigarettes daily  Vaping Use  . Vaping Use: Some days  Substance Use Topics  . Alcohol use: Not Currently    Alcohol/week: 0.0 standard drinks    Comment: social  . Drug use: Yes    Types: Marijuana  Comment: SMOKED TODAY    Allergies: No Known Allergies  Medications Prior to Admission  Medication Sig Dispense Refill Last Dose  . acetaminophen (TYLENOL) 500 MG tablet Take 1,000 mg by mouth every 6 (six) hours as needed for mild pain or headache.     . cephALEXin (KEFLEX) 500 MG capsule Take 1 capsule (500 mg total) by mouth 4 (four) times daily for 7 days. 28 capsule 0   . cetirizine (ZYRTEC) 10 MG tablet Take 1 tablet (10 mg total) by mouth daily. 30 tablet 0   . cyclobenzaprine (FLEXERIL) 10 MG tablet Take 1 tablet (10 mg total) by mouth 3 (three) times daily as needed for muscle spasms. 30 tablet 0   . fluticasone (FLONASE) 50 MCG/ACT nasal spray Place 2 sprays into both nostrils daily. 16 g 0   . hydrOXYzine (VISTARIL) 25 MG capsule Take 1 capsule (25 mg total) by mouth daily as needed for anxiety. 30 capsule 1   . lamoTRIgine (LAMICTAL) 25 MG tablet Take 25 mg by mouth 2 (two) times daily.     Marland Kitchen omeprazole (PRILOSEC) 20 MG capsule Take 20 mg by mouth daily.     . traMADol (ULTRAM) 50 MG tablet Take 1 tablet (50 mg total) by mouth every 6 (six) hours as needed for severe pain. 10 tablet 0   . valACYclovir (VALTREX) 500 MG tablet Take 500 mg  by mouth daily.       Review of Systems  Constitutional: Negative for chills and fever.  Respiratory: Negative for shortness of breath.   All other systems reviewed and are negative.  Physical Exam   Blood pressure 124/82, pulse (!) 107, temperature 98.1 F (36.7 C), temperature source Oral, resp. rate 16, last menstrual period 12/14/2019, SpO2 98 %, unknown if currently breastfeeding.  Physical Exam Vitals reviewed.  Constitutional:      Appearance: She is well-developed.  HENT:     Head: Normocephalic and atraumatic.  Cardiovascular:     Rate and Rhythm: Normal rate and regular rhythm.     Heart sounds: Normal heart sounds.  Pulmonary:     Effort: Pulmonary effort is normal.     Breath sounds: Normal breath sounds.  Abdominal:     General: Abdomen is flat. Bowel sounds are decreased. There is distension and abdominal bruit.  Genitourinary:    Rectum: Normal. No mass.     Comments: Good rectal tone Skin:    General: Skin is warm and dry.     Capillary Refill: Capillary refill takes less than 2 seconds.  Neurological:     General: No focal deficit present.     Mental Status: She is alert and oriented to person, place, and time.     Cranial Nerves: Cranial nerves are intact.     Motor: No weakness, tremor, atrophy or abnormal muscle tone.     Coordination: Coordination is intact.  Psychiatric:        Mood and Affect: Mood normal.        Behavior: Behavior normal.     Results for orders placed or performed during the hospital encounter of 08/21/20 (from the past 24 hour(s))  Urinalysis, Routine w reflex microscopic Urine, Clean Catch     Status: Abnormal   Collection Time: 08/21/20  3:50 PM  Result Value Ref Range   Color, Urine AMBER (A) YELLOW   APPearance CLOUDY (A) CLEAR   Specific Gravity, Urine 1.019 1.005 - 1.030   pH 6.0 5.0 - 8.0   Glucose, UA  NEGATIVE NEGATIVE mg/dL   Hgb urine dipstick NEGATIVE NEGATIVE   Bilirubin Urine NEGATIVE NEGATIVE   Ketones, ur  20 (A) NEGATIVE mg/dL   Protein, ur NEGATIVE NEGATIVE mg/dL   Nitrite NEGATIVE NEGATIVE   Leukocytes,Ua MODERATE (A) NEGATIVE   WBC, UA 0-5 0 - 5 WBC/hpf   Bacteria, UA NONE SEEN NONE SEEN   Squamous Epithelial / LPF 0-5 0 - 5   Mucus PRESENT    Amorphous Crystal PRESENT   Comprehensive metabolic panel     Status: Abnormal   Collection Time: 08/21/20  5:35 PM  Result Value Ref Range   Sodium 134 (L) 135 - 145 mmol/L   Potassium 3.6 3.5 - 5.1 mmol/L   Chloride 100 98 - 111 mmol/L   CO2 25 22 - 32 mmol/L   Glucose, Bld 102 (H) 70 - 99 mg/dL   BUN 7 6 - 20 mg/dL   Creatinine, Ser 0.60 0.44 - 1.00 mg/dL   Calcium 9.1 8.9 - 10.3 mg/dL   Total Protein 6.6 6.5 - 8.1 g/dL   Albumin 2.4 (L) 3.5 - 5.0 g/dL   AST 10 (L) 15 - 41 U/L   ALT 9 0 - 44 U/L   Alkaline Phosphatase 128 (H) 38 - 126 U/L   Total Bilirubin 0.8 0.3 - 1.2 mg/dL   GFR, Estimated >60 >60 mL/min   Anion gap 9 5 - 15  CBC     Status: Abnormal   Collection Time: 08/21/20  5:35 PM  Result Value Ref Range   WBC 10.8 (H) 4.0 - 10.5 K/uL   RBC 3.55 (L) 3.87 - 5.11 MIL/uL   Hemoglobin 10.4 (L) 12.0 - 15.0 g/dL   HCT 31.9 (L) 36.0 - 46.0 %   MCV 89.9 80.0 - 100.0 fL   MCH 29.3 26.0 - 34.0 pg   MCHC 32.6 30.0 - 36.0 g/dL   RDW 15.6 (H) 11.5 - 15.5 %   Platelets 294 150 - 400 K/uL   nRBC 0.3 (H) 0.0 - 0.2 %  D-dimer, quantitative     Status: Abnormal   Collection Time: 08/21/20  5:35 PM  Result Value Ref Range   D-Dimer, Quant 1.24 (H) 0.00 - 0.50 ug/mL-FEU   DG CHEST PORT 1 VIEW  Result Date: 08/21/2020 CLINICAL DATA:  Ongoing back pain since recent MAU visit 07/29/2020. Pregnant at [redacted]w[redacted]d EXAM: PORTABLE CHEST 1 VIEW COMPARISON:  Radiographs 01/22/2019 FINDINGS: Bandlike opacity in the lung bases compatible with subsegmental atelectasis. No layering effusion or pneumothorax. No focal airspace opacity is seen. Cardiomediastinal contours are unremarkable for portable technique. No other acute osseous or soft tissue  abnormality. IMPRESSION: Subsegmental atelectasis in the lung bases. No other acute cardiopulmonary abnormality. Electronically Signed   By: Lovena Le M.D.   On: 08/21/2020 18:20     MAU Course  Procedures  MDM 1730 - Called to patient's room. Sudden onset of left midpain pain with breathing. Patient taking extremely shallow breaths. Will check CXR, CBC, CMP, ddimer. O2 sat 97%.  1955 - called by Radiology. Patient has two fluid collections within the spinal canal causing impingement that could lead to cauda equina syndrome. Unsure of what type of fluid collection this is. One is possibly blood.   Paged neurosurgery - Talked with Dr Ellene Route. Recommended admission, bedrest for the time being, with medical management overnight. Will look at images.  Assessment and Plan  1. [redacted] weeks gestation of pregnancy  2. Vaginal bleeding in pregnancy, third trimester  3. Back pain -  DG CHEST PORT 1 VIEW; Standing - DG CHEST PORT 1 VIEW  4. [redacted] weeks gestation of pregnancy  5. Spinal cord compression (HCC)  Admit Pain control MRI of thoracic spine tomorrow. Repeat labs tomorrow Neurosurgery to see.  Truett Mainland 08/21/2020, 4:18 PM

## 2020-08-21 NOTE — MAU Note (Signed)
Pt was laying on her side and took deep breath in and had a sharp pain in her left mid upper back. Had Dr. Nehemiah Settle come to bedside. Chest x-ray ordered and lab work. Medicated with diladid and reposition pt back to sitting position.

## 2020-08-21 NOTE — MAU Note (Signed)
Colleen Valentine is a 25 y.o. at [redacted]w[redacted]d here in MAU reporting: ongoing back pain since MAU visit on march 5. Has been Rx muscle relaxers and they help some but pain is really bad. Legs are now numb and she has lost control of her legs. Also reporting left upper chest pain. Last night had bright red bleeding but none today. No LOF. +FM. Also having   Onset of complaint: ongoing  Pain score: back 10/10, pelvis 8/10, chest pain 7/10, RUQ 10/10  Vitals:   08/21/20 1526  BP: 124/82  Pulse: (!) 107  Resp: 16  Temp: 98.1 F (36.7 C)  SpO2: 98%     FHT: +FM  Lab orders placed from triage: UA

## 2020-08-21 NOTE — H&P (Addendum)
History     CSN: [id number]  Arrival date and time: 08/21/20 1438   Event Date/Time   First Provider Initiated Contact with Patient 08/21/20 1607      Chief Complaint  Patient presents with  . Abdominal Pain  . Back Pain  . Chest Pain  . Pelvic Pain   HPI  This is 24yo [id number] at [redacted]w[redacted]d. She presents with back pain with increasing leg numbness with decreased strength in her legs. This has been ongoing since 3/5. She has been taking flexeril for her back pain. She was seen in the ED for this on 3/20. They tried to do an MRI, but the patient wasn't able to lay down long enough for the procedure. Since that time, she has continued to have back pain with falls several times a day due to her legs giving out on her. Flexeril hasn't been extremely helpful.   She also complains of constipation for 2 weeks - has not had a stool despite taking 2 caps of miralax twice a day for the past several days. Has also taken colace. No palliating or provoking factors.   OB History    Gravida  5   Para  1   Term  1   Preterm      AB  3   Living  1     SAB  2   IAB  1   Ectopic      Multiple      Live Births  1           Past Medical History:  Diagnosis Date  . Acute pyelonephritis 05/17/2018  . Anemia   . Anxiety   . Complication of anesthesia   . Depression   . GERD (gastroesophageal reflux disease)   . Herpes genitalia   . Insomnia   . Obesity   . Polysubstance abuse ([place]) 04/01/2011  . PONV (postoperative nausea and vomiting)   . Seizures ([place])    once in 2016 due to alcohol poisioning  . Suicide [facility])     Past Surgical History:  Procedure Laterality Date  . CHOLECYSTECTOMY  2015  . COSMETIC SURGERY  Age 39   dog bite to face    Family History  Problem Relation Age of Onset  . Alcohol abuse Mother   . Stroke Mother   . Bipolar disorder Mother   . Drug abuse Mother   . Alcohol abuse Father   . Bipolar disorder Father   . Drug abuse Father   . Colon  cancer Maternal Grandfather   . Crohn's disease Paternal Grandmother   . Crohn's disease Paternal Aunt   . Crohn's disease Paternal Aunt   . Crohn's disease Paternal Uncle   . Colon cancer Paternal Uncle   . Cancer Maternal Aunt        "stomach cancer"  . Celiac disease Neg Hx     Social History   Tobacco Use  . Smoking status: Former Smoker    Packs/day: 0.50    Years: 5.00    Pack years: 2.50    Types: Cigarettes    Quit date: 11/24/2019    Years since quitting: 0.7  . Smokeless tobacco: Never Used  . Tobacco comment: one pack cigarettes daily  Vaping Use  . Vaping Use: Some days  Substance Use Topics  . Alcohol use: Not Currently    Alcohol/week: 0.0 standard drinks    Comment: social  . Drug use: Yes    Types: Marijuana  Comment: SMOKED TODAY    Allergies: No Known Allergies  Medications Prior to Admission  Medication Sig Dispense Refill Last Dose  . acetaminophen (TYLENOL) 500 MG tablet Take 1,000 mg by mouth every 6 (six) hours as needed for mild pain or headache.     . cephALEXin (KEFLEX) 500 MG capsule Take 1 capsule (500 mg total) by mouth 4 (four) times daily for 7 days. 28 capsule 0   . cetirizine (ZYRTEC) 10 MG tablet Take 1 tablet (10 mg total) by mouth daily. 30 tablet 0   . cyclobenzaprine (FLEXERIL) 10 MG tablet Take 1 tablet (10 mg total) by mouth 3 (three) times daily as needed for muscle spasms. 30 tablet 0   . fluticasone (FLONASE) 50 MCG/ACT nasal spray Place 2 sprays into both nostrils daily. 16 g 0   . hydrOXYzine (VISTARIL) 25 MG capsule Take 1 capsule (25 mg total) by mouth daily as needed for anxiety. 30 capsule 1   . lamoTRIgine (LAMICTAL) 25 MG tablet Take 25 mg by mouth 2 (two) times daily.     Marland Kitchen omeprazole (PRILOSEC) 20 MG capsule Take 20 mg by mouth daily.     . traMADol (ULTRAM) 50 MG tablet Take 1 tablet (50 mg total) by mouth every 6 (six) hours as needed for severe pain. 10 tablet 0   . valACYclovir (VALTREX) 500 MG tablet Take 500 mg  by mouth daily.       Review of Systems  Constitutional: Negative for chills and fever.  Respiratory: Negative for shortness of breath.   All other systems reviewed and are negative.  Physical Exam   Blood pressure 124/82, pulse (!) 107, temperature 98.1 F (36.7 C), temperature source Oral, resp. rate 16, last menstrual period 12/14/2019, SpO2 98 %, unknown if currently breastfeeding.  Physical Exam Vitals reviewed.  Constitutional:      Appearance: She is well-developed.  HENT:     Head: Normocephalic and atraumatic.  Cardiovascular:     Rate and Rhythm: Normal rate and regular rhythm.     Heart sounds: Normal heart sounds.  Pulmonary:     Effort: Pulmonary effort is normal.     Breath sounds: Normal breath sounds.  Abdominal:     General: Abdomen is flat. Bowel sounds are decreased. There is distension and abdominal bruit.  Genitourinary:    Rectum: Normal. No mass.     Comments: Good rectal tone Skin:    General: Skin is warm and dry.     Capillary Refill: Capillary refill takes less than 2 seconds.  Neurological:     General: No focal deficit present.     Mental Status: She is alert and oriented to person, place, and time.     Cranial Nerves: Cranial nerves are intact.     Motor: No weakness, tremor, atrophy or abnormal muscle tone.     Coordination: Coordination is intact.  Psychiatric:        Mood and Affect: Mood normal.        Behavior: Behavior normal.     Results for orders placed or performed during the hospital encounter of 08/21/20 (from the past 24 hour(s))  Urinalysis, Routine w reflex microscopic Urine, Clean Catch     Status: Abnormal   Collection Time: 08/21/20  3:50 PM  Result Value Ref Range   Color, Urine AMBER (A) YELLOW   APPearance CLOUDY (A) CLEAR   Specific Gravity, Urine 1.019 1.005 - 1.030   pH 6.0 5.0 - 8.0   Glucose, UA  NEGATIVE NEGATIVE mg/dL   Hgb urine dipstick NEGATIVE NEGATIVE   Bilirubin Urine NEGATIVE NEGATIVE   Ketones, ur  20 (A) NEGATIVE mg/dL   Protein, ur NEGATIVE NEGATIVE mg/dL   Nitrite NEGATIVE NEGATIVE   Leukocytes,Ua MODERATE (A) NEGATIVE   WBC, UA 0-5 0 - 5 WBC/hpf   Bacteria, UA NONE SEEN NONE SEEN   Squamous Epithelial / LPF 0-5 0 - 5   Mucus PRESENT    Amorphous Crystal PRESENT   Comprehensive metabolic panel     Status: Abnormal   Collection Time: 08/21/20  5:35 PM  Result Value Ref Range   Sodium 134 (L) 135 - 145 mmol/L   Potassium 3.6 3.5 - 5.1 mmol/L   Chloride 100 98 - 111 mmol/L   CO2 25 22 - 32 mmol/L   Glucose, Bld 102 (H) 70 - 99 mg/dL   BUN 7 6 - 20 mg/dL   Creatinine, Ser 0.60 0.44 - 1.00 mg/dL   Calcium 9.1 8.9 - 10.3 mg/dL   Total Protein 6.6 6.5 - 8.1 g/dL   Albumin 2.4 (L) 3.5 - 5.0 g/dL   AST 10 (L) 15 - 41 U/L   ALT 9 0 - 44 U/L   Alkaline Phosphatase 128 (H) 38 - 126 U/L   Total Bilirubin 0.8 0.3 - 1.2 mg/dL   GFR, Estimated >60 >60 mL/min   Anion gap 9 5 - 15  CBC     Status: Abnormal   Collection Time: 08/21/20  5:35 PM  Result Value Ref Range   WBC 10.8 (H) 4.0 - 10.5 K/uL   RBC 3.55 (L) 3.87 - 5.11 MIL/uL   Hemoglobin 10.4 (L) 12.0 - 15.0 g/dL   HCT 31.9 (L) 36.0 - 46.0 %   MCV 89.9 80.0 - 100.0 fL   MCH 29.3 26.0 - 34.0 pg   MCHC 32.6 30.0 - 36.0 g/dL   RDW 15.6 (H) 11.5 - 15.5 %   Platelets 294 150 - 400 K/uL   nRBC 0.3 (H) 0.0 - 0.2 %  D-dimer, quantitative     Status: Abnormal   Collection Time: 08/21/20  5:35 PM  Result Value Ref Range   D-Dimer, Quant 1.24 (H) 0.00 - 0.50 ug/mL-FEU   MR LUMBAR SPINE WO CONTRAST  Result Date: 08/21/2020 CLINICAL DATA:  Low back pain.  Numbness and weakness in legs. EXAM: MRI LUMBAR SPINE WITHOUT CONTRAST TECHNIQUE: Multiplanar, multisequence MR imaging of the lumbar spine was performed. No intravenous contrast was administered. COMPARISON:  Lumbar radiographs August 13, 2020. FINDINGS: Segmentation: Inferior-most fully formed intervertebral disc labeled L5-S1. Alignment:  Normal. Vertebrae: Vertebral body heights  are maintained. No specific evidence of acute fracture, discitis/osteomyelitis, or suspicious bone lesion. Conus medullaris and cauda equina: Conus is poorly characterized due to canal stenosis detailed below. No overt abnormal cord signal. Paraspinal and other soft tissues: Partially imaged fetus. There is abnormal stir hyperintense signal within the paraspinal musculature on the left in the lower thoracic spine, incompletely characterized. Canal: Large heterogeneous, largely STIR hyperintense collection in the dorsal canal, spanning from the imaged lower thoracic spine inferiorly to the L3 level which effaces dorsal epidural fat (favored epidural in location). This collection measures up to approximately 1.2 cm in AP dimension at L2-L3. There is resulting moderate to severe canal stenosis at T11-T12, T12-L1, L1-L2 and L2-L3. There is an additional fluid collection in the dorsal canal which appears to be in a separate space, spanning from L3 inferiorly to the sacrum. This collection is homogeneously T2/stir hyperintense  and does not efface dorsal epidural fat (favored to be subdural in location). This collection measures up to 13 x 7 mm at the L4 level (series 8, image 24) and displaces cauda quinine nerve roots anteriorly. Along with posterior disc bulge, there is resulting severe canal stenosis at L3-L4. Moderate canal stenosis at L4-L5. Superimposed degenerative disc disease with disc height loss and desiccation at L3-L4 and L4-L5. As mentioned above, there is posterior disc bulge at L3-L4. No high-grade foraminal stenosis. IMPRESSION: 1. 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 The superior collection is favored epidural in location and the inferior is favored to be subdural in location. The superior collection is heterogeneous, indeterminate but possibly  hemorrhage given recent trauma. The inferior collection is more homogeneous, indeterminate but possibly CSF. When combined with a posterior disc bulge, there is severe canal stenosis at L3-L4. There is moderate to severe stenosis at T11-T12 through L2-L3 and moderate canal stenosis at L4-L5. Unfortunately, the patient cannot receive contrast to further characterize given pregnancy. 2. Abnormal edema in the left paraspinal soft tissues in the lower thoracic spine. This may represent contusion/strain, but is incompletely imaged on this study. Recommend MRI of the thoracic spine to further evaluate this finding, evaluate for any other traumatic findings, and characterize the cranial extent of the canal collection. Findings and recommendations discussed with Dr. Nehemiah Settle via telephone at 8:14 p.m. Electronically Signed   By: Margaretha Sheffield MD   On: 08/21/2020 20:39   DG CHEST PORT 1 VIEW  Result Date: 08/21/2020 CLINICAL DATA:  Ongoing back pain since recent MAU visit 07/29/2020. Pregnant at [redacted]w[redacted]d EXAM: PORTABLE CHEST 1 VIEW COMPARISON:  Radiographs 01/22/2019 FINDINGS: Bandlike opacity in the lung bases compatible with subsegmental atelectasis. No layering effusion or pneumothorax. No focal airspace opacity is seen. Cardiomediastinal contours are unremarkable for portable technique. No other acute osseous or soft tissue abnormality. IMPRESSION: Subsegmental atelectasis in the lung bases. No other acute cardiopulmonary abnormality. Electronically Signed   By: Lovena Le M.D.   On: 08/21/2020 18:20       MAU Course  Procedures  MDM 1730 - Called to patient's room. Sudden onset of left midpain pain with breathing. Patient taking extremely shallow breaths. Will check CXR, CBC, CMP, ddimer. O2 sat 97%.  1955 - called by Radiology. Patient has two fluid collections within the spinal canal causing impingement that could lead to cauda equina syndrome. Unsure of what type of fluid collection this is. One is  possibly blood. Also having thoracic paraspinal edema. Unsure of etiology and only able to see distal part of thoracic paraspinals. Recommended thoracic MRI.  Paged neurosurgery - Talked with Dr Ellene Route. Recommended admission, bedrest for the time being, with medical management overnight. Will look at images.  Assessment and Plan  1. [redacted] weeks gestation of pregnancy  2. Vaginal bleeding in pregnancy, third trimester  3. Back pain - DG CHEST PORT 1 VIEW; Standing - DG CHEST PORT 1 VIEW  4. [redacted] weeks gestation of pregnancy  5. Spinal cord compression (HCC)  Admit Pain control MRI of thoracic spine tomorrow. Repeat labs tomorrow Neurosurgery to see.  Truett Mainland 08/21/2020, 4:18 PM

## 2020-08-21 NOTE — MAU Note (Signed)
MRI tech called reported pt can not lie down on back due to pain. Notified Dr. Nehemiah Settle and he ordered 1mg  dilaudid. Went to MRI and gave medication.

## 2020-08-22 ENCOUNTER — Inpatient Hospital Stay (HOSPITAL_COMMUNITY): Payer: Medicaid Other | Admitting: Anesthesiology

## 2020-08-22 ENCOUNTER — Inpatient Hospital Stay (HOSPITAL_COMMUNITY): Payer: Medicaid Other

## 2020-08-22 ENCOUNTER — Ambulatory Visit: Payer: Medicaid Other | Admitting: *Deleted

## 2020-08-22 ENCOUNTER — Encounter (HOSPITAL_COMMUNITY)
Admission: AD | Disposition: A | Discharge status: Home / Self Care | Payer: Self-pay | Source: Home / Self Care | Attending: Obstetrics and Gynecology

## 2020-08-22 ENCOUNTER — Inpatient Hospital Stay (HOSPITAL_BASED_OUTPATIENT_CLINIC_OR_DEPARTMENT_OTHER): Payer: Medicaid Other

## 2020-08-22 ENCOUNTER — Encounter: Payer: Medicaid Other | Admitting: Women's Health

## 2020-08-22 DIAGNOSIS — G959 Disease of spinal cord, unspecified: Secondary | ICD-10-CM

## 2020-08-22 DIAGNOSIS — O24410 Gestational diabetes mellitus in pregnancy, diet controlled: Secondary | ICD-10-CM

## 2020-08-22 DIAGNOSIS — G9589 Other specified diseases of spinal cord: Secondary | ICD-10-CM | POA: Diagnosis present

## 2020-08-22 DIAGNOSIS — O99213 Obesity complicating pregnancy, third trimester: Secondary | ICD-10-CM | POA: Diagnosis not present

## 2020-08-22 DIAGNOSIS — Z363 Encounter for antenatal screening for malformations: Secondary | ICD-10-CM

## 2020-08-22 DIAGNOSIS — D434 Neoplasm of uncertain behavior of spinal cord: Secondary | ICD-10-CM

## 2020-08-22 DIAGNOSIS — Z3A34 34 weeks gestation of pregnancy: Secondary | ICD-10-CM

## 2020-08-22 DIAGNOSIS — O99353 Diseases of the nervous system complicating pregnancy, third trimester: Secondary | ICD-10-CM

## 2020-08-22 HISTORY — PX: IR FLUORO GUIDED NEEDLE PLC ASPIRATION/INJECTION LOC: IMG2395

## 2020-08-22 HISTORY — PX: LAMINECTOMY: SHX219

## 2020-08-22 HISTORY — PX: IR US GUIDE BX ASP/DRAIN: IMG2392

## 2020-08-22 LAB — CBC
HCT: 28.5 % — ABNORMAL LOW (ref 36.0–46.0)
Hemoglobin: 9.7 g/dL — ABNORMAL LOW (ref 12.0–15.0)
MCH: 30 pg (ref 26.0–34.0)
MCHC: 34 g/dL (ref 30.0–36.0)
MCV: 88.2 fL (ref 80.0–100.0)
Platelets: 274 10*3/uL (ref 150–400)
RBC: 3.23 MIL/uL — ABNORMAL LOW (ref 3.87–5.11)
RDW: 15.9 % — ABNORMAL HIGH (ref 11.5–15.5)
WBC: 12 10*3/uL — ABNORMAL HIGH (ref 4.0–10.5)
nRBC: 0.2 % (ref 0.0–0.2)

## 2020-08-22 LAB — TYPE AND SCREEN
ABO/RH(D): O POS
Antibody Screen: NEGATIVE

## 2020-08-22 SURGERY — THORACIC LAMINECTOMY FOR TUMOR
Anesthesia: General

## 2020-08-22 MED ORDER — PROPOFOL 10 MG/ML IV BOLUS
INTRAVENOUS | Status: DC | PRN
  Administered 2020-08-22: 160 mg via INTRAVENOUS

## 2020-08-22 MED ORDER — LORAZEPAM 0.5 MG PO TABS
0.5000 mg | ORAL_TABLET | Freq: Two times a day (BID) | ORAL | Status: DC | PRN
  Administered 2020-08-22 – 2020-09-02 (×8): 0.5 mg via ORAL
  Filled 2020-08-22 (×8): qty 1

## 2020-08-22 MED ORDER — LIDOCAINE-EPINEPHRINE 1 %-1:100000 IJ SOLN
INTRAMUSCULAR | Status: AC
  Filled 2020-08-22: qty 1

## 2020-08-22 MED ORDER — ROCURONIUM 10MG/ML (10ML) SYRINGE FOR MEDFUSION PUMP - OPTIME
INTRAVENOUS | Status: DC | PRN
  Administered 2020-08-22: 50 mg via INTRAVENOUS
  Administered 2020-08-23: 20 mg via INTRAVENOUS
  Administered 2020-08-23: 10 mg via INTRAVENOUS

## 2020-08-22 MED ORDER — LORAZEPAM 2 MG/ML IJ SOLN: 1.0000 mg | Freq: Once | INTRAMUSCULAR | Status: AC

## 2020-08-22 MED ORDER — VALACYCLOVIR HCL 500 MG PO TABS
500.0000 mg | ORAL_TABLET | Freq: Two times a day (BID) | ORAL | Status: DC
Start: 2020-08-22 — End: 2020-09-03
  Administered 2020-08-22 – 2020-09-03 (×24): 500 mg via ORAL
  Filled 2020-08-22 (×24): qty 1

## 2020-08-22 MED ORDER — BISACODYL 5 MG PO TBEC
10.0000 mg | DELAYED_RELEASE_TABLET | Freq: Two times a day (BID) | ORAL | Status: DC | PRN
  Administered 2020-08-24: 10 mg via ORAL
  Filled 2020-08-22 (×2): qty 2

## 2020-08-22 MED ORDER — FENTANYL CITRATE (PF) 250 MCG/5ML IJ SOLN
INTRAMUSCULAR | Status: AC
  Filled 2020-08-22: qty 5

## 2020-08-22 MED ORDER — LIDOCAINE HCL (CARDIAC) PF 100 MG/5ML IV SOSY
PREFILLED_SYRINGE | INTRAVENOUS | Status: DC | PRN
  Administered 2020-08-22: 100 mg via INTRAVENOUS

## 2020-08-22 MED ORDER — ESMOLOL HCL 100 MG/10ML IV SOLN
INTRAVENOUS | Status: DC | PRN
  Administered 2020-08-22: 30 mg via INTRAVENOUS

## 2020-08-22 MED ORDER — LACTATED RINGERS IV SOLN: INTRAVENOUS | Status: DC

## 2020-08-22 MED ORDER — FENTANYL CITRATE (PF) 250 MCG/5ML IJ SOLN
INTRAMUSCULAR | Status: DC | PRN
  Administered 2020-08-22: 100 ug via INTRAVENOUS
  Administered 2020-08-22 – 2020-08-23 (×3): 50 ug via INTRAVENOUS

## 2020-08-22 MED ORDER — SODIUM CHLORIDE 0.9% FLUSH
3.0000 mL | Freq: Two times a day (BID) | INTRAVENOUS | Status: DC
  Administered 2020-08-22 – 2020-08-29 (×9): 3 mL via INTRAVENOUS

## 2020-08-22 MED ORDER — TETRACAINE HCL 1 % IJ SOLN
200.0000 mg | Freq: Once | INTRAMUSCULAR | Status: DC
  Filled 2020-08-22 (×2): qty 20

## 2020-08-22 MED ORDER — LACTATED RINGERS IV SOLN: INTRAVENOUS | Status: DC | PRN

## 2020-08-22 MED ORDER — BUPIVACAINE HCL (PF) 0.5 % IJ SOLN
INTRAMUSCULAR | Status: AC
  Filled 2020-08-22: qty 30

## 2020-08-22 MED ORDER — PROPOFOL 10 MG/ML IV BOLUS
INTRAVENOUS | Status: AC
  Filled 2020-08-22: qty 20

## 2020-08-22 MED ORDER — LORAZEPAM 2 MG/ML IJ SOLN
INTRAMUSCULAR | Status: AC
  Administered 2020-08-22: 1 mg
  Filled 2020-08-22: qty 1

## 2020-08-22 MED ORDER — THROMBIN 5000 UNITS EX SOLR
CUTANEOUS | Status: AC
  Filled 2020-08-22: qty 5000

## 2020-08-22 MED ORDER — SUCCINYLCHOLINE 20MG/ML (10ML) SYRINGE FOR MEDFUSION PUMP - OPTIME
INTRAMUSCULAR | Status: DC | PRN
  Administered 2020-08-22: 140 mg via INTRAVENOUS

## 2020-08-22 MED ORDER — MUPIROCIN 2 % EX OINT
1.0000 "application " | TOPICAL_OINTMENT | Freq: Two times a day (BID) | CUTANEOUS | Status: AC
  Administered 2020-08-22 – 2020-08-27 (×10): 1 via NASAL
  Filled 2020-08-22 (×2): qty 22

## 2020-08-22 MED ORDER — TETRACAINE HCL 1 % IJ SOLN
INTRAMUSCULAR | Status: DC | PRN
  Administered 2020-08-22: 70 mg

## 2020-08-22 MED ORDER — LORAZEPAM 2 MG/ML IJ SOLN
1.0000 mg | Freq: Once | INTRAMUSCULAR | Status: AC
  Administered 2020-08-22: 1 mg via INTRAVENOUS
  Filled 2020-08-22: qty 1

## 2020-08-22 SURGICAL SUPPLY — 73 items
APL SKNCLS STERI-STRIP NONHPOA (GAUZE/BANDAGES/DRESSINGS)
BAND INSRT 18 STRL LF DISP RB (MISCELLANEOUS)
BAND RUBBER #18 3X1/16 STRL (MISCELLANEOUS) IMPLANT
BENZOIN TINCTURE PRP APPL 2/3 (GAUZE/BANDAGES/DRESSINGS) IMPLANT
BLADE CLIPPER SURG (BLADE) ×2 IMPLANT
BLADE SURG 11 STRL SS (BLADE) IMPLANT
BUR MATCHSTICK NEURO 3.0 LAGG (BURR) ×2 IMPLANT
CANISTER SUCT 3000ML PPV (MISCELLANEOUS) ×2 IMPLANT
CARTRIDGE OIL MAESTRO DRILL (MISCELLANEOUS) ×1 IMPLANT
CATH ROBINSON RED A/P 10FR (CATHETERS) ×2 IMPLANT
CATH VENTRIC 35X38 W/TROCAR LG (CATHETERS) ×2 IMPLANT
CLIP VESOCCLUDE MED 6/CT (CLIP) IMPLANT
COVER WAND RF STERILE (DRAPES) ×2 IMPLANT
DECANTER SPIKE VIAL GLASS SM (MISCELLANEOUS) ×2 IMPLANT
DEVICE DISSECT PLASMABLAD 3.0S (MISCELLANEOUS) ×1 IMPLANT
DIFFUSER DRILL AIR PNEUMATIC (MISCELLANEOUS) ×2 IMPLANT
DRAIN JACKSON PRT FLT 7MM (DRAIN) ×2 IMPLANT
DRAPE LAPAROTOMY 100X72 PEDS (DRAPES) IMPLANT
DRAPE LAPAROTOMY 100X72X124 (DRAPES) ×2 IMPLANT
DRAPE MICROSCOPE LEICA (MISCELLANEOUS) IMPLANT
DURAPREP 26ML APPLICATOR (WOUND CARE) ×2 IMPLANT
ELECT REM PT RETURN 9FT ADLT (ELECTROSURGICAL) ×2
ELECTRODE REM PT RTRN 9FT ADLT (ELECTROSURGICAL) ×1 IMPLANT
EVACUATOR SILICONE 100CC (DRAIN) ×2 IMPLANT
GAUZE 4X4 16PLY RFD (DISPOSABLE) IMPLANT
GAUZE SPONGE 4X4 12PLY STRL (GAUZE/BANDAGES/DRESSINGS) ×2 IMPLANT
GLOVE BIOGEL PI IND STRL 8.5 (GLOVE) ×1 IMPLANT
GLOVE BIOGEL PI INDICATOR 8.5 (GLOVE) ×1
GLOVE ECLIPSE 8.5 STRL (GLOVE) ×4 IMPLANT
GLOVE EXAM NITRILE XL STR (GLOVE) IMPLANT
GLOVE SURG UNDER POLY LF SZ7.5 (GLOVE) ×6 IMPLANT
GOWN STRL REUS W/ TWL LRG LVL3 (GOWN DISPOSABLE) IMPLANT
GOWN STRL REUS W/ TWL XL LVL3 (GOWN DISPOSABLE) ×1 IMPLANT
GOWN STRL REUS W/TWL 2XL LVL3 (GOWN DISPOSABLE) ×2 IMPLANT
GOWN STRL REUS W/TWL LRG LVL3 (GOWN DISPOSABLE)
GOWN STRL REUS W/TWL XL LVL3 (GOWN DISPOSABLE) ×2
HEMOSTAT POWDER SURGIFOAM 1G (HEMOSTASIS) ×2 IMPLANT
HEMOSTAT SURGICEL 2X14 (HEMOSTASIS) IMPLANT
KIT BASIN OR (CUSTOM PROCEDURE TRAY) ×2 IMPLANT
KIT TURNOVER KIT B (KITS) ×2 IMPLANT
NEEDLE HYPO 22GX1.5 SAFETY (NEEDLE) ×2 IMPLANT
NS IRRIG 1000ML POUR BTL (IV SOLUTION) ×2 IMPLANT
OIL CARTRIDGE MAESTRO DRILL (MISCELLANEOUS) ×2
PACK LAMINECTOMY NEURO (CUSTOM PROCEDURE TRAY) ×2 IMPLANT
PAD ARMBOARD 7.5X6 YLW CONV (MISCELLANEOUS) ×4 IMPLANT
PATTIES SURGICAL .25X.25 (GAUZE/BANDAGES/DRESSINGS) IMPLANT
PATTIES SURGICAL .5 X1 (DISPOSABLE) ×2 IMPLANT
PATTIES SURGICAL .5 X3 (DISPOSABLE) IMPLANT
PATTIES SURGICAL 1/4 X 3 (GAUZE/BANDAGES/DRESSINGS) IMPLANT
PLASMABLADE 3.0S (MISCELLANEOUS) ×2
SPECIMEN JAR SMALL (MISCELLANEOUS) ×2 IMPLANT
SPONGE LAP 4X18 RFD (DISPOSABLE) IMPLANT
SPONGE SURGIFOAM ABS GEL 100 (HEMOSTASIS) ×2 IMPLANT
STAPLER SKIN PROX WIDE 3.9 (STAPLE) ×2 IMPLANT
STRIP CLOSURE SKIN 1/2X4 (GAUZE/BANDAGES/DRESSINGS) IMPLANT
SUT NURALON 4 0 TR CR/8 (SUTURE) IMPLANT
SUT PDS AB 1 CTX 36 (SUTURE) IMPLANT
SUT PROLENE 6 0 BV (SUTURE) IMPLANT
SUT SILK 3 0 TIES 17X18 (SUTURE)
SUT SILK 3-0 18XBRD TIE BLK (SUTURE) IMPLANT
SUT VIC AB 1 CT1 18XBRD ANBCTR (SUTURE) ×1 IMPLANT
SUT VIC AB 1 CT1 8-18 (SUTURE) ×2
SUT VIC AB 2-0 CP2 18 (SUTURE) ×2 IMPLANT
SUT VIC AB 3-0 SH 8-18 (SUTURE) ×2 IMPLANT
SUT VIC AB 4-0 RB1 18 (SUTURE) ×2 IMPLANT
SWAB CULTURE ESWAB REG 1ML (MISCELLANEOUS) ×2 IMPLANT
SWAB CULTURE LIQUID MINI MALE (MISCELLANEOUS) ×2 IMPLANT
SYR CONTROL 10ML LL (SYRINGE) ×2 IMPLANT
TAPE CLOTH SURG 6X10 WHT LF (GAUZE/BANDAGES/DRESSINGS) ×2 IMPLANT
TOWEL GREEN STERILE (TOWEL DISPOSABLE) ×2 IMPLANT
TOWEL GREEN STERILE FF (TOWEL DISPOSABLE) ×2 IMPLANT
TRAY FOLEY MTR SLVR 16FR STAT (SET/KITS/TRAYS/PACK) IMPLANT
WATER STERILE IRR 1000ML POUR (IV SOLUTION) ×2 IMPLANT

## 2020-08-22 NOTE — Progress Notes (Addendum)
Patient ID: Colleen Valentine, female   DOB: [birth year], 25 y.o.   MRN: [id number]  [place] ANTEPARTUM NOTE  Colleen Valentine is a 25 y.o. [id number] at [redacted]w[redacted]d  who is admitted for spinal cord compression.   Fetal presentation is cephalic. Length of Stay:  1  Days  Subjective: Still having quite a bit of back pain with numbness in legs. Appetite is normal. No new symptoms from yesterday. Patient reports good fetal movement.   She reports no uterine contractions She reports no bleeding  She reports no loss of fluid per vagina.  Vitals:  Blood pressure 123/89, pulse (!) 113, temperature 98.3 F (36.8 C), temperature source Oral, resp. rate 18, last menstrual period 12/14/2019, SpO2 100 %, unknown if currently breastfeeding. Physical Examination:  General appearance - alert, well appearing, and in no distress Chest - clear to auscultation, no wheezes, rales or rhonchi, symmetric air entry Heart - normal rate, regular rhythm, normal S1, S2, no murmurs, rubs, clicks or gallops Abdomen - soft, nontender, nondistended, no masses or organomegaly Extremities - peripheral pulses normal, no pedal edema, no clubbing or cyanosis Skin - normal coloration and turgor, no rashes, no suspicious skin lesions noted Fundal Height:  size equals dates Pelvic Exam:  examination not indicated Cervical Exam: Not evaluated.  Membranes:intact  Fetal Monitoring:  Baseline: 155 bpm, Variability: Good {> 6 bpm), Accelerations: Reactive and Decelerations: Absent  Labs:  Results for orders placed or performed during the hospital encounter of 08/21/20 (from the past 24 hour(s))  Urinalysis, Routine w reflex microscopic Urine, Clean Catch   Collection Time: 08/21/20  3:50 PM  Result Value Ref Range   Color, Urine AMBER (A) YELLOW   APPearance CLOUDY (A) CLEAR   Specific Gravity, Urine 1.019 1.005 - 1.030   pH 6.0 5.0 - 8.0   Glucose, UA NEGATIVE NEGATIVE mg/dL   Hgb urine dipstick NEGATIVE NEGATIVE   Bilirubin  Urine NEGATIVE NEGATIVE   Ketones, ur 20 (A) NEGATIVE mg/dL   Protein, ur NEGATIVE NEGATIVE mg/dL   Nitrite NEGATIVE NEGATIVE   Leukocytes,Ua MODERATE (A) NEGATIVE   WBC, UA 0-5 0 - 5 WBC/hpf   Bacteria, UA NONE SEEN NONE SEEN   Squamous Epithelial / LPF 0-5 0 - 5   Mucus PRESENT    Amorphous Crystal PRESENT   Comprehensive metabolic panel   Collection Time: 08/21/20  5:35 PM  Result Value Ref Range   Sodium 134 (L) 135 - 145 mmol/L   Potassium 3.6 3.5 - 5.1 mmol/L   Chloride 100 98 - 111 mmol/L   CO2 25 22 - 32 mmol/L   Glucose, Bld 102 (H) 70 - 99 mg/dL   BUN 7 6 - 20 mg/dL   Creatinine, Ser 0.60 0.44 - 1.00 mg/dL   Calcium 9.1 8.9 - 10.3 mg/dL   Total Protein 6.6 6.5 - 8.1 g/dL   Albumin 2.4 (L) 3.5 - 5.0 g/dL   AST 10 (L) 15 - 41 U/L   ALT 9 0 - 44 U/L   Alkaline Phosphatase 128 (H) 38 - 126 U/L   Total Bilirubin 0.8 0.3 - 1.2 mg/dL   GFR, Estimated >60 >60 mL/min   Anion gap 9 5 - 15  CBC   Collection Time: 08/21/20  5:35 PM  Result Value Ref Range   WBC 10.8 (H) 4.0 - 10.5 K/uL   RBC 3.55 (L) 3.87 - 5.11 MIL/uL   Hemoglobin 10.4 (L) 12.0 - 15.0 g/dL   HCT 31.9 (L) 36.0 - 46.0 %  MCV 89.9 80.0 - 100.0 fL   MCH 29.3 26.0 - 34.0 pg   MCHC 32.6 30.0 - 36.0 g/dL   RDW 15.6 (H) 11.5 - 15.5 %   Platelets 294 150 - 400 K/uL   nRBC 0.3 (H) 0.0 - 0.2 %  D-dimer, quantitative   Collection Time: 08/21/20  5:35 PM  Result Value Ref Range   D-Dimer, Quant 1.24 (H) 0.00 - 0.50 ug/mL-FEU  Resp Panel by RT-PCR (Flu A&B, Covid) Nasopharyngeal Swab   Collection Time: 08/21/20  9:03 PM   Specimen: Nasopharyngeal Swab; Nasopharyngeal(NP) swabs in vial transport medium  Result Value Ref Range   SARS Coronavirus 2 by RT PCR NEGATIVE NEGATIVE   Influenza A by PCR NEGATIVE NEGATIVE   Influenza B by PCR NEGATIVE NEGATIVE  CBC   Collection Time: 08/22/20  5:13 AM  Result Value Ref Range   WBC 12.0 (H) 4.0 - 10.5 K/uL   RBC 3.23 (L) 3.87 - 5.11 MIL/uL   Hemoglobin 9.7 (L) 12.0 -  15.0 g/dL   HCT 28.5 (L) 36.0 - 46.0 %   MCV 88.2 80.0 - 100.0 fL   MCH 30.0 26.0 - 34.0 pg   MCHC 34.0 30.0 - 36.0 g/dL   RDW 15.9 (H) 11.5 - 15.5 %   Platelets 274 150 - 400 K/uL   nRBC 0.2 0.0 - 0.2 %  Type and screen [place]   Collection Time: 08/22/20  5:13 AM  Result Value Ref Range   ABO/RH(D) O POS    Antibody Screen NEG    Sample Expiration      08/25/2020,2359 Performed at [facility], [address] [place], [place] [address]     Imaging Studies:    MR LUMBAR SPINE WO CONTRAST  Result Date: 08/21/2020 CLINICAL DATA:  Low back pain.  Numbness and weakness in legs. EXAM: MRI LUMBAR SPINE WITHOUT CONTRAST TECHNIQUE: Multiplanar, multisequence MR imaging of the lumbar spine was performed. No intravenous contrast was administered. COMPARISON:  Lumbar radiographs August 13, 2020. FINDINGS: Segmentation: Inferior-most fully formed intervertebral disc labeled L5-S1. Alignment:  Normal. Vertebrae: Vertebral body heights are maintained. No specific evidence of acute fracture, discitis/osteomyelitis, or suspicious bone lesion. Conus medullaris and cauda equina: Conus is poorly characterized due to canal stenosis detailed below. No overt abnormal cord signal. Paraspinal and other soft tissues: Partially imaged fetus. There is abnormal stir hyperintense signal within the paraspinal musculature on the left in the lower thoracic spine, incompletely characterized. Canal: Large heterogeneous, largely STIR hyperintense collection in the dorsal canal, spanning from the imaged lower thoracic spine inferiorly to the L3 level which effaces dorsal epidural fat (favored epidural in location). This collection measures up to approximately 1.2 cm in AP dimension at L2-L3. There is resulting moderate to severe canal stenosis at T11-T12, T12-L1, L1-L2 and L2-L3. There is an additional fluid collection in the dorsal canal which appears to be in a separate space, spanning from L3  inferiorly to the sacrum. This collection is homogeneously T2/stir hyperintense and does not efface dorsal epidural fat (favored to be subdural in location). This collection measures up to 13 x 7 mm at the L4 level (series 8, image 24) and displaces cauda quinine nerve roots anteriorly. Along with posterior disc bulge, there is resulting severe canal stenosis at L3-L4. Moderate canal stenosis at L4-L5. Superimposed degenerative disc disease with disc height loss and desiccation at L3-L4 and L4-L5. As mentioned above, there is posterior disc bulge at L3-L4. No high-grade foraminal  stenosis. IMPRESSION: 1. 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 The superior collection is favored epidural in location and the inferior is favored to be subdural in location. The superior collection is heterogeneous, indeterminate but possibly hemorrhage given recent trauma. The inferior collection is more homogeneous, indeterminate but possibly CSF. When combined with a posterior disc bulge, there is severe canal stenosis at L3-L4. There is moderate to severe stenosis at T11-T12 through L2-L3 and moderate canal stenosis at L4-L5. Unfortunately, the patient cannot receive contrast to further characterize given pregnancy. 2. Abnormal edema in the left paraspinal soft tissues in the lower thoracic spine. This may represent contusion/strain, but is incompletely imaged on this study. Recommend MRI of the thoracic spine to further evaluate this finding, evaluate for any other traumatic findings, and characterize the cranial extent of the canal collection. Findings and recommendations discussed with Dr. Nehemiah Settle via telephone at 8:14 p.m. Electronically Signed   By: Margaretha Sheffield MD   On: 08/21/2020 20:39   DG CHEST PORT 1 VIEW  Result Date: 08/21/2020 CLINICAL DATA:  Ongoing back  pain since recent MAU visit 07/29/2020. Pregnant at [redacted]w[redacted]d EXAM: PORTABLE CHEST 1 VIEW COMPARISON:  Radiographs 01/22/2019 FINDINGS: Bandlike opacity in the lung bases compatible with subsegmental atelectasis. No layering effusion or pneumothorax. No focal airspace opacity is seen. Cardiomediastinal contours are unremarkable for portable technique. No other acute osseous or soft tissue abnormality. IMPRESSION: Subsegmental atelectasis in the lung bases. No other acute cardiopulmonary abnormality. Electronically Signed   By: Lovena Le M.D.   On: 08/21/2020 18:20      Medications:  Scheduled . docusate sodium  100 mg Oral Daily  . lamoTRIgine  25 mg Oral BID  . pantoprazole  40 mg Oral Daily  . prenatal multivitamin  1 tablet Oral Q1200  . sodium chloride flush  3 mL Intravenous Q12H  . valACYclovir  500 mg Oral Daily   I have reviewed the patient's current medications.  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 34 weeks Pregnancy  Growth [place] today.  NST daily.  I spoke with neonatology regarding the patient - they have consulted as well.  She received BMZ on 3/5&6. Rescue course of steroids not indicated, given gestational age. 2. 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  I discussed patient with Dr Donalee Citrin with MFM regarding timing of delivery. Optimally, we would wait for tissue biopsy - if malignant, then delivery at any time after 34 weeks would be appropriate. If benign mass, then would recommend waiting until 37 weeks for delivery if possible.  Would anticipate cesarean delivery (would be general anesthesia as neuroaxial anesthesia would be contraindicated).  Continue routine antenatal care.   Truett Mainland, DO 08/22/2020,11:20  AM  Addendum MRI shows posterior epidural lesion from T10-L3 extending into the foramen (left more than right). There is extension of the lesion to the paravertebral and paraspinous muscles (left more than right).   It is uncertain whether the lesion is solid or purulent collection. IR has been consulted for possible biopsy.  Truett Mainland, DO

## 2020-08-22 NOTE — Progress Notes (Signed)
Pt. transported to short stay OR by nurse and nurse tech via bed.

## 2020-08-22 NOTE — Progress Notes (Addendum)
Patient ID: Colleen Valentine, female   DOB: 18-Mar-1996, 25 y.o.   MRN: [id number] Vital signs are stable MRI of thoracic spine was reviewed with the radiologist and the mass does have attention through the foramen particularly T12-L1 on the left side.  This was needle aspirated by interventional radiology and proved to be pus.  At this point I believe that she needs surgical decompression of her spinal canal from T10-L3.  This can be done via laminotomies at several levels.  I discussed this with her this evening.  I also discussed this with Dr. Dara Lords the [facility] specialist.  We are making arrangements currently to proceed with surgery to decompress her epidural space this evening as she has substantial compromise of the epidural space from this material.  We will also contact infectious disease for some guidance regarding antibiotic coverage in this special case.  [place] discussed the obstetrical risks with [name].  I will obtain consent for the neurosurgical procedure.  On my exam this evening and note that she has 3 out of 5 strength in the iliopsoas 2 out of 5 strength in the quadriceps 3 out of 5 strength in the dorsiflexors 3 out of 5 strength in the gastrocs.  Sensation is diminished from the level of the thighs on down.  She has been able to void but with great difficulty having to strain.  As regards her bowels she notes that she has not had a bowel movement in 2 weeks time was given an enema yesterday.

## 2020-08-22 NOTE — Progress Notes (Signed)
Patient to MRI via WC by transporter

## 2020-08-22 NOTE — Consult Note (Signed)
Antenatal Consultation Neonatology Requested by: Nehemiah Settle Reason: spinal tumor at 34 weeks pregnancy  I met [name] and explained the usual problems seen at preterm birth at 70 weeks, the expected management, and usual length of stay, as well as discharge criteria.  R.L. Colleen Valentine.D.

## 2020-08-22 NOTE — Procedures (Signed)
INTERVENTIONAL NEURORADIOLOGY BRIEF POSTPROCEDURE NOTE  Fluoroscopy guided epidural abscess fine-needle aspiration   Attending: Dr. Pedro Earls  Assistant: None.   Diagnosis: Epidural fluid collection versus mass.   Access site: Percutaneous.   Anesthesia: Local   Medication used: 5 mL of tetracaine 1%.  Complications: None.   Estimated blood loss: None.   Specimen: 2 cc of purulent fluid sent for Gram stain and culture.   Findings: Paraspinal soft tissues at the T12-L1 level evaluated with ultrasound.  However no good delineation of fluid collection/mass obtained.  Fluoroscopy guided aspiration attempted of the paraspinal soft tissues with no yield.  A 20 gauge spinal needle was then advanced into the posterior aspect of the spinal canal at the T12-L1 level under fluoroscopy guidance.  Approximately 2 mL of thick purulent fluid fluid was obtained and sent for Gram stain and culture.   The patient tolerated the procedure well without incident or complication and is in stable condition.

## 2020-08-22 NOTE — Progress Notes (Signed)
OB Note  Plan is for Dr. Ellene Route to do drainage of lumbar abscess in lateral recumbent position and if everything goes well lasts for about 90 minutes under general anesthesia in the main OR.   Discussed with MFM Dr Gertie Exon and plan is for [facility] RN in OR and monitor the fetus for five minute segments during the surgery. I talked to the patient and we don't anticipate need for emergency c-section and given just need for drainage we can keep the pregnant and not have to deliver the baby pre-term. NICU aware and will have team on standby in case need for emergency delivery.  Patient had reactive NST at 1500 this afternoon. Will have RN get FHTs prior to leaving the floor.   Durene Romans MD [phone] [facility]ending [facility] [place] [facility]ulty [facility]) 08/22/2020 Time: 2100

## 2020-08-22 NOTE — Progress Notes (Signed)
Patient to procedure via bed by transporter

## 2020-08-22 NOTE — Anesthesia Preprocedure Evaluation (Signed)
Anesthesia Evaluation  Patient identified by MRN, date of birth, ID band Patient awake    Reviewed: Allergy & Precautions, NPO status , Patient's Chart, lab work & pertinent test results  History of Anesthesia Complications (+) PONV  Airway Mallampati: II  TM Distance: >3 FB Neck ROM: Full    Dental no notable dental hx.    Pulmonary neg pulmonary ROS, former smoker,    Pulmonary exam normal breath sounds clear to auscultation       Cardiovascular negative cardio ROS   Rhythm:Regular Rate:Tachycardia     Neuro/Psych negative neurological ROS  negative psych ROS   GI/Hepatic Neg liver ROS, GERD  ,  Endo/Other  diabetesMorbid obesity34 weeks pregnant  Renal/GU negative Renal ROS  negative genitourinary   Musculoskeletal negative musculoskeletal ROS (+)   Abdominal   Peds negative pediatric ROS (+)  Hematology  (+) anemia ,   Anesthesia Other Findings   Reproductive/Obstetrics negative OB ROS                             Anesthesia Physical Anesthesia Plan  ASA: III and emergent  Anesthesia Plan: General   Post-op Pain Management:    Induction: Intravenous and Rapid sequence  PONV Risk Score and Plan: Ondansetron, Dexamethasone and Treatment may vary due to age or medical condition  Airway Management Planned: Oral ETT and Video Laryngoscope Planned  Additional Equipment:   Intra-op Plan:   Post-operative Plan: Extubation in OR  Informed Consent: I have reviewed the patients History and Physical, chart, labs and discussed the procedure including the risks, benefits and alternatives for the proposed anesthesia with the patient or authorized representative who has indicated his/her understanding and acceptance.     Dental advisory given  Plan Discussed with: CRNA and Surgeon  Anesthesia Plan Comments:         Anesthesia Quick Evaluation

## 2020-08-22 NOTE — Consult Note (Signed)
Reason for Consult: Leg weakness back pain lesion on MRI Referring Physician: Dr. Rico Ala [name] is an 25 y.o. female.  HPI: Patient is a 25 year old female who is [redacted] weeks pregnant.  She has been having back pain and leg weakness developing since the beginning of March.  She was seen on March 20 but was too uncomfortable to lie for an MRI.  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  She was treated conservatively with Flexeril.  She has had a number of falls because her legs she feels have become progressively weaker.  Yesterday she underwent an MRI of the lumbar spine which shows a complex lesion from the level of L3 and above this appears to be consistent with a tumor formation and there is a portion of the tumor that extends extraforaminal he on the left side at the T12-L1 level.  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  This may be secondary to entrapment of CSF from the extra medullary compression above.  I reviewed the MRI with the radiologist.  The patient is to have another MRI of the thoracic spine.  I believe that she would be well served to have a biopsy done via needle aspiration by interventional radiology.  Ultimately however early delivery of the baby may be required so that she may undergo further therapy for spinal cord compression.  Past Medical History:  Diagnosis Date  . Acute pyelonephritis 05/17/2018  . Anemia   . Anxiety   . Complication of anesthesia   . Depression   . GERD (gastroesophageal reflux disease)   . Herpes genitalia   . Insomnia   . Obesity   . Polysubstance abuse ([place]) 04/01/2011  . PONV (postoperative nausea and vomiting)   . Seizures ([place])    once in 2016 due to alcohol poisioning  . Suicide [facility])     Past Surgical History:  Procedure Laterality Date  . CHOLECYSTECTOMY  2015  . COSMETIC SURGERY  Age 79   dog bite to face    Family History  Problem  Relation Age of Onset  . Alcohol abuse Mother   . Stroke Mother   . Bipolar disorder Mother   . Drug abuse Mother   . Alcohol abuse Father   . Bipolar disorder Father   . Drug abuse Father   . Colon cancer Maternal Grandfather   . Crohn's disease Paternal Grandmother   . Crohn's disease Paternal Aunt   . Crohn's disease Paternal Aunt   . Crohn's disease Paternal Uncle   . Colon cancer Paternal Uncle   . Cancer Maternal Aunt        "stomach cancer"  . Celiac disease Neg Hx     Social History:  reports that she quit smoking about 8 months ago. Her smoking use included cigarettes. She has a 2.50 pack-year smoking history. She has never used smokeless tobacco. She reports previous alcohol use. She reports current drug use. Drug: Marijuana.  Allergies: No Known Allergies  Medications: I have reviewed the patient's current medications.  Results for orders placed or performed during the hospital encounter of 08/21/20 (from the past 48 hour(s))  Urinalysis, Routine w reflex microscopic Urine, Clean Catch     Status: Abnormal   Collection Time: 08/21/20  3:50 PM  Result Value Ref Range   Color, Urine AMBER (A) YELLOW    Comment: BIOCHEMICALS MAY BE AFFECTED BY COLOR  APPearance CLOUDY (A) CLEAR   Specific Gravity, Urine 1.019 1.005 - 1.030   pH 6.0 5.0 - 8.0   Glucose, UA NEGATIVE NEGATIVE mg/dL   Hgb urine dipstick NEGATIVE NEGATIVE   Bilirubin Urine NEGATIVE NEGATIVE   Ketones, ur 20 (A) NEGATIVE mg/dL   Protein, ur NEGATIVE NEGATIVE mg/dL   Nitrite NEGATIVE NEGATIVE   Leukocytes,Ua MODERATE (A) NEGATIVE   WBC, UA 0-5 0 - 5 WBC/hpf   Bacteria, UA NONE SEEN NONE SEEN   Squamous Epithelial / LPF 0-5 0 - 5   Mucus PRESENT    Amorphous Crystal PRESENT     Comment: Performed at [facility], [place] [address], [place], [place] [address]  Comprehensive metabolic panel     Status: Abnormal   Collection Time: 08/21/20  5:35 PM  Result Value Ref Range   Sodium 134 (L) 135 - 145  mmol/L   Potassium 3.6 3.5 - 5.1 mmol/L   Chloride 100 98 - 111 mmol/L   CO2 25 22 - 32 mmol/L   Glucose, Bld 102 (H) 70 - 99 mg/dL    Comment: Glucose reference range applies only to samples taken after fasting for at least 8 hours.   BUN 7 6 - 20 mg/dL   Creatinine, Ser 0.60 0.44 - 1.00 mg/dL   Calcium 9.1 8.9 - 10.3 mg/dL   Total Protein 6.6 6.5 - 8.1 g/dL   Albumin 2.4 (L) 3.5 - 5.0 g/dL   AST 10 (L) 15 - 41 U/L   ALT 9 0 - 44 U/L   Alkaline Phosphatase 128 (H) 38 - 126 U/L   Total Bilirubin 0.8 0.3 - 1.2 mg/dL   GFR, Estimated >60 >60 mL/min    Comment: (NOTE) Calculated using the CKD-EPI Creatinine Equation (2021)    Anion gap 9 5 - 15    Comment: Performed at [facility], [place] [address] [place], [place] [address]  CBC     Status: Abnormal   Collection Time: 08/21/20  5:35 PM  Result Value Ref Range   WBC 10.8 (H) 4.0 - 10.5 K/uL   RBC 3.55 (L) 3.87 - 5.11 MIL/uL   Hemoglobin 10.4 (L) 12.0 - 15.0 g/dL   HCT 31.9 (L) 36.0 - 46.0 %   MCV 89.9 80.0 - 100.0 fL   MCH 29.3 26.0 - 34.0 pg   MCHC 32.6 30.0 - 36.0 g/dL   RDW 15.6 (H) 11.5 - 15.5 %   Platelets 294 150 - 400 K/uL   nRBC 0.3 (H) 0.0 - 0.2 %    Comment: Performed at [place] [address] [place], [place] [address]  D-dimer, quantitative     Status: Abnormal   Collection Time: 08/21/20  5:35 PM  Result Value Ref Range   D-Dimer, Quant 1.24 (H) 0.00 - 0.50 ug/mL-FEU    Comment: (NOTE) At the manufacturer cut-off value of 0.5 g/mL FEU, this assay has a negative predictive value of 95-100%.This assay is intended for use in conjunction with a clinical pretest probability (PTP) assessment model to exclude pulmonary embolism (PE) and deep venous thrombosis (DVT) in outpatients suspected of PE or DVT. Results should be correlated with clinical presentation. Performed at [facility], [place] [address] [place], [place] [address]   Resp Panel by RT-PCR (Flu A&B, Covid) Nasopharyngeal Swab      Status: None   Collection Time: 08/21/20  9:03 PM   Specimen: Nasopharyngeal Swab; Nasopharyngeal(NP) swabs in vial transport medium  Result Value Ref Range   SARS Coronavirus 2 by RT PCR NEGATIVE NEGATIVE    Comment: (NOTE) SARS-CoV-2 target nucleic acids are NOT DETECTED.  The SARS-CoV-2 RNA is generally detectable in upper respiratory specimens during the acute phase of infection. The lowest concentration of SARS-CoV-2 viral copies this assay can detect is 138 copies/mL. A negative result does not preclude SARS-Cov-2 infection and should not be used as the sole basis for treatment or other patient management decisions. A negative result may occur with  improper specimen collection/handling, submission of specimen other than nasopharyngeal swab, presence of viral mutation(s) within the areas targeted by this assay, and inadequate number of viral copies(<138 copies/mL). A negative result must be combined with clinical observations, patient history, and epidemiological information. The expected result is Negative.  Fact Sheet for Patients:  EntrepreneurPulse.com.au  Fact Sheet for Healthcare Providers:  IncredibleEmployment.be  This test is no t yet approved or cleared by the [place] FDA and  has been authorized for detection and/or diagnosis of SARS-CoV-2 by FDA under an Emergency Use Authorization (EUA). This EUA will remain  in effect (meaning this test can be used) for the duration of the COVID-19 declaration under Section 564(b)(1) of the Act, 21 U.S.C.section 360bbb-3(b)(1), unless the authorization is terminated  or revoked sooner.       Influenza A by PCR NEGATIVE NEGATIVE   Influenza B by PCR NEGATIVE NEGATIVE    Comment: (NOTE) The Xpert Xpress SARS-CoV-2/FLU/RSV plus assay is intended as an aid in the diagnosis of influenza from Nasopharyngeal swab specimens and should not be used as a sole basis for treatment. Nasal  washings and aspirates are unacceptable for Xpert Xpress SARS-CoV-2/FLU/RSV testing.  Fact Sheet for Patients: EntrepreneurPulse.com.au  Fact Sheet for Healthcare Providers: IncredibleEmployment.be  This test is not yet approved or cleared by the [place] FDA and has been authorized for detection and/or diagnosis of SARS-CoV-2 by FDA under an Emergency Use Authorization (EUA). This EUA will remain in effect (meaning this test can be used) for the duration of the COVID-19 declaration under Section 564(b)(1) of the Act, 21 U.S.C. section 360bbb-3(b)(1), unless the authorization is terminated or revoked.  Performed at [facility], [place] [address] [place], [place] [address]   Type and screen [place]     Status: None   Collection Time: 08/22/20  5:13 AM  Result Value Ref Range   ABO/RH(D) O POS    Antibody Screen NEG    Sample Expiration      08/25/2020,2359 Performed at [facility], [place] [address] [place], [place] [address]   CBC     Status: Abnormal   Collection Time: 08/22/20  5:13 AM  Result Value Ref Range   WBC 12.0 (H) 4.0 - 10.5 K/uL   RBC 3.23 (L) 3.87 - 5.11 MIL/uL   Hemoglobin 9.7 (L) 12.0 - 15.0 g/dL   HCT 28.5 (L) 36.0 - 46.0 %   MCV 88.2 80.0 - 100.0 fL   MCH 30.0 26.0 - 34.0 pg   MCHC 34.0 30.0 - 36.0 g/dL   RDW 15.9 (H) 11.5 - 15.5 %   Platelets 274 150 - 400 K/uL   nRBC 0.2 0.0 - 0.2 %    Comment: Performed at [facility], [place] [address] [place], [place] [address]    MR LUMBAR SPINE WO CONTRAST  Result Date: 08/21/2020 CLINICAL DATA:  Low back pain.  Numbness and weakness in legs. EXAM: MRI LUMBAR SPINE  WITHOUT CONTRAST TECHNIQUE: Multiplanar, multisequence MR imaging of the lumbar spine was performed. No intravenous contrast was administered. COMPARISON:  Lumbar radiographs August 13, 2020. FINDINGS: Segmentation: Inferior-most fully formed intervertebral disc labeled L5-S1.  Alignment:  Normal. Vertebrae: Vertebral body heights are maintained. No specific evidence of acute fracture, discitis/osteomyelitis, or suspicious bone lesion. Conus medullaris and cauda equina: Conus is poorly characterized due to canal stenosis detailed below. No overt abnormal cord signal. Paraspinal and other soft tissues: Partially imaged fetus. There is abnormal stir hyperintense signal within the paraspinal musculature on the left in the lower thoracic spine, incompletely characterized. Canal: Large heterogeneous, largely STIR hyperintense collection in the dorsal canal, spanning from the imaged lower thoracic spine inferiorly to the L3 level which effaces dorsal epidural fat (favored epidural in location). This collection measures up to approximately 1.2 cm in AP dimension at L2-L3. There is resulting moderate to severe canal stenosis at T11-T12, T12-L1, L1-L2 and L2-L3. There is an additional fluid collection in the dorsal canal which appears to be in a separate space, spanning from L3 inferiorly to the sacrum. This collection is homogeneously T2/stir hyperintense and does not efface dorsal epidural fat (favored to be subdural in location). This collection measures up to 13 x 7 mm at the L4 level (series 8, image 24) and displaces cauda quinine nerve roots anteriorly. Along with posterior disc bulge, there is resulting severe canal stenosis at L3-L4. Moderate canal stenosis at L4-L5. Superimposed degenerative disc disease with disc height loss and desiccation at L3-L4 and L4-L5. As mentioned above, there is posterior disc bulge at L3-L4. No high-grade foraminal stenosis. IMPRESSION: 1. 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 The superior collection is favored epidural in location and the inferior is favored to be subdural in location. The superior  collection is heterogeneous, indeterminate but possibly hemorrhage given recent trauma. The inferior collection is more homogeneous, indeterminate but possibly CSF. When combined with a posterior disc bulge, there is severe canal stenosis at L3-L4. There is moderate to severe stenosis at T11-T12 through L2-L3 and moderate canal stenosis at L4-L5. Unfortunately, the patient cannot receive contrast to further characterize given pregnancy. 2. Abnormal edema in the left paraspinal soft tissues in the lower thoracic spine. This may represent contusion/strain, but is incompletely imaged on this study. Recommend MRI of the thoracic spine to further evaluate this finding, evaluate for any other traumatic findings, and characterize the cranial extent of the canal collection. Findings and recommendations discussed with Dr. Nehemiah Settle via telephone at 8:14 p.m. Electronically Signed   By: Margaretha Sheffield MD   On: 08/21/2020 20:39   DG CHEST PORT 1 VIEW  Result Date: 08/21/2020 CLINICAL DATA:  Ongoing back pain since recent [facility] visit 07/29/2020. Pregnant at [redacted]w[redacted]d EXAM: PORTABLE CHEST 1 VIEW COMPARISON:  Radiographs 01/22/2019 FINDINGS: Bandlike opacity in the lung bases compatible with subsegmental atelectasis. No layering effusion or pneumothorax. No focal airspace opacity is seen. Cardiomediastinal contours are unremarkable for portable technique. No other acute osseous or soft tissue abnormality. IMPRESSION: Subsegmental atelectasis in the lung bases. No other acute cardiopulmonary abnormality. Electronically Signed   By: Lovena Le M.D.   On: 08/21/2020 18:20    Review of Systems  Constitutional: Positive for activity change.  Musculoskeletal: Positive for back pain.  Neurological: Positive for weakness and numbness.  Hematological: Negative.   Psychiatric/Behavioral: Negative.  Blood pressure 123/89, pulse (!) 113, temperature 98.3 F (36.8 C), temperature source Oral, resp. rate 18, last menstrual period  12/14/2019, SpO2 100 %, unknown if currently breastfeeding. 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  A portion of the lesion extends extraforaminal E on the left side.  Plan: Discussed with interventional radiology for needle biopsy of the lesion.  MRI of the thoracic spine this morning.  I have discussed this with the obstetric specialist this morning.  Early delivery of the baby is being considered and prepared for.  Colleen Valentine John 08/22/2020, 10:13 AM

## 2020-08-22 NOTE — Progress Notes (Signed)
Chief Complaint: Patient was seen in consultation today for spinous fluid collection/mass aspiration  Referring Physician(s): Dr. Ellene Route  Supervising Physician: Pedro Earls  Patient Status: [facility] - In-pt  History of Present Illness: Colleen Valentine is a 25 y.o. female admitted with back pain and leg weakness. She is [redacted] weeks pregnant as well. Has had pain for a few weeks. Workup finds evidence of fluid collection vs mass that extends from lower thoracic region to mid lumbar region. Neurosurgery consulted with concern for cord compression. Review of imaging, NIR is asked to obtain aspiration/sampling to help determine if this area is tumor vs infection. PMHx, meds, labs, imaging, allergies reviewed.     Past Medical History:  Diagnosis Date  . Acute pyelonephritis 05/17/2018  . Anemia   . Anxiety   . Complication of anesthesia   . Depression   . GERD (gastroesophageal reflux disease)   . Herpes genitalia   . Insomnia   . Obesity   . Polysubstance abuse ([place]) 04/01/2011  . PONV (postoperative nausea and vomiting)   . Seizures ([place])    once in 2016 due to alcohol poisioning  . Suicide [facility])     Past Surgical History:  Procedure Laterality Date  . CHOLECYSTECTOMY  2015  . COSMETIC SURGERY  Age 89   dog bite to face    Allergies: Patient has no known allergies.  Medications:  Current Facility-Administered Medications:  .  acetaminophen (TYLENOL) tablet 650 mg, 650 mg, Oral, Q4H PRN, Truett Mainland, DO, 650 mg at 08/22/20 0804 .  bisacodyl (DULCOLAX) EC tablet 10 mg, 10 mg, Oral, BID PRN, Truett Mainland, DO .  calcium carbonate (TUMS - dosed in mg elemental calcium) chewable tablet 400 mg of elemental calcium, 2 tablet, Oral, Q4H PRN, Truett Mainland, DO, 400 mg of elemental calcium at 08/22/20 0124 .  cyclobenzaprine (FLEXERIL) tablet 10 mg, 10 mg, Oral, TID PRN, Truett Mainland, DO, 10 mg at 08/22/20 1244 .  docusate sodium (COLACE) capsule  100 mg, 100 mg, Oral, Daily, Truett Mainland, DO, 100 mg at 08/22/20 1110 .  HYDROmorphone (DILAUDID) injection 1 mg, 1 mg, Intravenous, Q3H PRN, Truett Mainland, DO, 1 mg at 08/22/20 1243 .  hydrOXYzine (ATARAX/VISTARIL) tablet 25 mg, 25 mg, Oral, Daily PRN, Truett Mainland, DO .  lamoTRIgine (LAMICTAL) tablet 25 mg, 25 mg, Oral, BID, Truett Mainland, DO, 25 mg at 08/22/20 1111 .  pantoprazole (PROTONIX) EC tablet 40 mg, 40 mg, Oral, Daily, Truett Mainland, DO, 40 mg at 08/22/20 1110 .  prenatal multivitamin tablet 1 tablet, 1 tablet, Oral, Q1200, Truett Mainland, DO, 1 tablet at 08/22/20 1110 .  sodium chloride flush (NS) 0.9 % injection 3 mL, 3 mL, Intravenous, Q12H, Truett Mainland, DO, 3 mL at 08/22/20 1244 .  valACYclovir (VALTREX) tablet 500 mg, 500 mg, Oral, Daily, Truett Mainland, DO, 500 mg at 08/22/20 1110 .  zolpidem (AMBIEN) tablet 5 mg, 5 mg, Oral, QHS PRN, Truett Mainland, DO, 5 mg at 08/21/20 2204    Family History  Problem Relation Age of Onset  . Alcohol abuse Mother   . Stroke Mother   . Bipolar disorder Mother   . Drug abuse Mother   . Alcohol abuse Father   . Bipolar disorder Father   . Drug abuse Father   . Colon cancer Maternal Grandfather   . Crohn's disease Paternal Grandmother   . Crohn's disease Paternal Aunt   . Crohn's  disease Paternal Aunt   . Crohn's disease Paternal Uncle   . Colon cancer Paternal Uncle   . Cancer Maternal Aunt        "stomach cancer"  . Celiac disease Neg Hx     Social History   Socioeconomic History  . Marital status: Single    Spouse name: Not on file  . Number of children: Not on file  . Years of education: Not on file  . Highest education level: Not on file  Occupational History  . 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 Smoking status: Former Smoker    Packs/day: 0.50    Years: 5.00    Pack years: 2.50    Types: Cigarettes    Quit date: 11/24/2019    Years since  quitting: 0.7  . Smokeless tobacco: Never Used  . Tobacco comment: one pack cigarettes daily  Vaping Use  . Vaping Use: Some days  Substance and Sexual Activity  . Alcohol use: Not Currently    Alcohol/week: 0.0 standard drinks    Comment: social  . Drug use: Yes    Types: Marijuana    Comment: SMOKED TODAY  . Sexual activity: Not Currently    Partners: Male    Birth control/protection: Other-see comments  Other Topics Concern  . Not on file  Social History Narrative   ** Merged History Encounter **       Social Determinants of Health   Financial Resource Strain: Not on file  Food Insecurity: Not on file  Transportation Needs: Not on file  Physical Activity: Not on file  Stress: Not on file  Social Connections: Not on file    Review of Systems: A 12 point ROS discussed and pertinent positives are indicated in the HPI above.  All other systems are negative.  Review of Systems  Vital Signs: BP 113/65 (BP Location: Right Arm)   Pulse (!) 102   Temp 98.1 F (36.7 C) (Oral)   Resp 18   LMP 12/14/2019 Comment: has had some spotting  SpO2 98%   Physical Exam Constitutional:      General: She is in acute distress.     Appearance: She is ill-appearing. She is not toxic-appearing.  Cardiovascular:     Rate and Rhythm: Normal rate and regular rhythm.     Heart sounds: Normal heart sounds.  Pulmonary:     Effort: Pulmonary effort is normal. No respiratory distress.     Breath sounds: Normal breath sounds.  Skin:    General: Skin is warm and dry.  Neurological:     General: No focal deficit present.     Mental Status: She is oriented to person, place, and time.  Psychiatric:        Mood and Affect: Mood normal.        Thought Content: Thought content normal.        Judgment: Judgment normal.      Imaging: DG Lumbar Spine 2-3 Views  Result Date: 08/13/2020 CLINICAL DATA:  Status post fall with back pain. Patient is pregnant. EXAM: LUMBAR SPINE - 2-3 VIEW  COMPARISON:  None. FINDINGS: Pars defect of L5 are identified. No acute fracture or dislocation is noted. Fetus is identified in abdomen and pelvis. IMPRESSION: Pars defect of L5.  No acute fracture or dislocation. Electronically Signed   By: Abelardo Diesel M.D.   On: 08/13/2020 15:25   MR THORACIC SPINE WO CONTRAST  Result Date: 08/22/2020  CLINICAL DATA:  Back pain and abnormal lumbar MRI EXAM: MRI THORACIC SPINE WITHOUT CONTRAST TECHNIQUE: Multiplanar, multisequence MR imaging of the thoracic spine was performed. No intravenous contrast was administered. COMPARISON:  Lumbar MRI from yesterday FINDINGS: Alignment:  Normal Vertebrae: No focal marrow signal abnormality. Cord: The cord is compressed from T10 to the conus and the upper cauda equina is compressed on prior MRI due to a lobulated epidural process effacing the thecal sac and compressing the cord from behind. No cord edema is seen. The lesion extends out multiple left-sided foramina, nearly replacing the fat at T11-12, and to a lesser extent outside the right foramina at T11-12. There is left paravertebral and paraspinous lobulation with STIR hyperintensity in the adjacent intrinsic back muscles. There is extension behind the crura of the left diaphragm with trace left pleural effusion. The lobulated appearance suggests mass rather than pure collection in the patient is also not septic per report. No coagulopathy or specific trauma per report Paraspinal and other soft tissues: Splenomegaly is again seen in the left upper quadrant, noted on a 2019 abdominal CT. Disc levels: No degenerative changes. There is plan for aspiration today. IMPRESSION: The patient's posterior epidural lesion extends from T10-L3 and infiltrates the left more than right foramina with left more than right paravertebral and paraspinous involvement. The left T11-12 facet is eroded but no regional marrow edema as would be expected for an inciting septic arthritis. It is uncertain if  this is tumor or purulent collection. Chronic splenomegaly. Electronically Signed   By: Monte Fantasia M.D.   On: 08/22/2020 11:46   MR LUMBAR SPINE WO CONTRAST  Result Date: 08/21/2020 CLINICAL DATA:  Low back pain.  Numbness and weakness in legs. EXAM: MRI LUMBAR SPINE WITHOUT CONTRAST TECHNIQUE: Multiplanar, multisequence MR imaging of the lumbar spine was performed. No intravenous contrast was administered. COMPARISON:  Lumbar radiographs August 13, 2020. FINDINGS: Segmentation: Inferior-most fully formed intervertebral disc labeled L5-S1. Alignment:  Normal. Vertebrae: Vertebral body heights are maintained. No specific evidence of acute fracture, discitis/osteomyelitis, or suspicious bone lesion. Conus medullaris and cauda equina: Conus is poorly characterized due to canal stenosis detailed below. No overt abnormal cord signal. Paraspinal and other soft tissues: Partially imaged fetus. There is abnormal stir hyperintense signal within the paraspinal musculature on the left in the lower thoracic spine, incompletely characterized. Canal: Large heterogeneous, largely STIR hyperintense collection in the dorsal canal, spanning from the imaged lower thoracic spine inferiorly to the L3 level which effaces dorsal epidural fat (favored epidural in location). This collection measures up to approximately 1.2 cm in AP dimension at L2-L3. There is resulting moderate to severe canal stenosis at T11-T12, T12-L1, L1-L2 and L2-L3. There is an additional fluid collection in the dorsal canal which appears to be in a separate space, spanning from L3 inferiorly to the sacrum. This collection is homogeneously T2/stir hyperintense and does not efface dorsal epidural fat (favored to be subdural in location). This collection measures up to 13 x 7 mm at the L4 level (series 8, image 24) and displaces cauda quinine nerve roots anteriorly. Along with posterior disc bulge, there is resulting severe canal stenosis at L3-L4. Moderate  canal stenosis at L4-L5. Superimposed degenerative disc disease with disc height loss and desiccation at L3-L4 and L4-L5. As mentioned above, there is posterior disc bulge at L3-L4. No high-grade foraminal stenosis. IMPRESSION: 1. Two large dorsal collections within the imaged spinal canal, one extends from the thoracic spine inferiorly to the L3 level and the other  extending from the L3 level inferiorly through the sacrum. The superior collection is favored epidural in location and the inferior is favored to be subdural in location. The superior collection is heterogeneous, indeterminate but possibly hemorrhage given recent trauma. The inferior collection is more homogeneous, indeterminate but possibly CSF. When combined with a posterior disc bulge, there is severe canal stenosis at L3-L4. There is moderate to severe stenosis at T11-T12 through L2-L3 and moderate canal stenosis at L4-L5. Unfortunately, the patient cannot receive contrast to further characterize given pregnancy. 2. Abnormal edema in the left paraspinal soft tissues in the lower thoracic spine. This may represent contusion/strain, but is incompletely imaged on this study. Recommend MRI of the thoracic spine to further evaluate this finding, evaluate for any other traumatic findings, and characterize the cranial extent of the canal collection. Findings and recommendations discussed with Dr. Nehemiah Settle via telephone at 8:14 p.m. Electronically Signed   By: Margaretha Sheffield MD   On: 08/21/2020 20:39   DG CHEST PORT 1 VIEW  Result Date: 08/21/2020 CLINICAL DATA:  Ongoing back pain since recent MAU visit 07/29/2020. Pregnant at [redacted]w[redacted]d EXAM: PORTABLE CHEST 1 VIEW COMPARISON:  Radiographs 01/22/2019 FINDINGS: Bandlike opacity in the lung bases compatible with subsegmental atelectasis. No layering effusion or pneumothorax. No focal airspace opacity is seen. Cardiomediastinal contours are unremarkable for portable technique. No other acute osseous or soft  tissue abnormality. IMPRESSION: Subsegmental atelectasis in the lung bases. No other acute cardiopulmonary abnormality. Electronically Signed   By: Lovena Le M.D.   On: 08/21/2020 18:20   [place] MFM OB LIMITED  Result Date: 07/29/2020 ----------------------------------------------------------------------  OBSTETRICS REPORT                       (Signed Final 07/29/2020 01:56 pm) ---------------------------------------------------------------------- Patient Info  ID #:       [id number]                          D.O.B.:  [birth year] (24 yrs)  Name:       Thayer Headings [place]                    Visit Date: 07/29/2020 09:37 am ---------------------------------------------------------------------- Performed By  Attending:        Johnell Comings MD         Ref. Address:      [place], [place]  [place]  Performed By:     Wilnette Kales        Secondary Phy.:    [facility] MAU/Triage                    RDMS,RVT  Referred By:      Shelle Iron              [facility]                    [place] ---------------------------------------------------------------------- Orders  #  Description                           Code        Ordered By  1  [place] MFM OB LIMITED                     [phone]    MELANIE BHAMBRI ----------------------------------------------------------------------  #  Order #                     Accession #                Episode #  1  [id number]                   [id number]                 [id number] ---------------------------------------------------------------------- Indications  Vaginal bleeding in pregnancy, third trimester  Q94.76  Obesity complicating pregnancy, third           O99.213  trimester  Encounter for antenatal screening,              Z36.9  unspecified  Poor  obstetric history-Recurrent (habitual)     O26.20  abortion (3 consecutive ab's)  [redacted] weeks gestation of pregnancy                 Z3A.30 ---------------------------------------------------------------------- Fetal Evaluation  Num Of Fetuses:          1  Fetal Heart Rate(bpm):   148  Cardiac Activity:        Observed  Presentation:            Breech, footling  Placenta:                Anterior  P. Cord Insertion:       Visualized, central  Amniotic Fluid  AFI FV:      Within normal limits  AFI Sum(cm)     %Tile       Largest Pocket(cm)  11.1            23          4.9  RUQ(cm)       RLQ(cm)       LUQ(cm)        LLQ(cm)  4.9           2.2           2.2            1.8  Comment:    No placental abruption or previa identified. ---------------------------------------------------------------------- OB History  Gravidity:    5  SAB:   2  TOP:          1        Living:  1 ---------------------------------------------------------------------- Gestational Age  LMP:           30w 6d        Date:  12/26/19                 EDD:   10/01/20  Best:          Weston Settle 6d     Det. By:  LMP  (12/26/19)          EDD:   10/01/20 ---------------------------------------------------------------------- Anatomy  Cranium:               Appears normal         Heart:                  Appears normal                                                                        (4CH, axis, and                                                                        situs)  Cavum:                 Appears normal         Diaphragm:              Appears normal  Ventricles:            Appears normal         Stomach:                Appears normal, left                                                                        sided  Choroid Plexus:        Appears normal         Abdomen:                Appears normal  Cerebellum:            Appears normal         Cord Vessels:           Appears normal (3  vessel cord)  Posterior Fossa:       Appears normal         Kidneys:                Appear normal  Nuchal Fold:           Not applicable (>24    Bladder:                Appears normal                         wks GA)  Lips:                  Appears normal ---------------------------------------------------------------------- Cervix Uterus Adnexa  Cervix  Length:              4  cm.  Normal appearance by transabdominal scan.  Uterus  No abnormality visualized.  Right Ovary  Within normal limits.  Left Ovary  Within normal limits.  Cul De Sac  No free fluid seen.  Adnexa  No abnormality visualized. ---------------------------------------------------------------------- Comments  This patient presented to the MAU due to vaginal bleeding.  A limited ultrasound performed today shows that the fetus is  in the breech presentation.  There was normal amniotic fluid noted.  The anterior placenta appeared within normal limits with no  retroplacental clots or placenta previa noted. ----------------------------------------------------------------------                   Johnell Comings, MD Electronically Signed Final Report   07/29/2020 01:56 pm ----------------------------------------------------------------------   Labs:  CBC: Recent Labs    08/02/20 1552 08/16/20 0029 08/21/20 1735 08/22/20 0513  WBC 9.3 9.1 10.8* 12.0*  HGB 10.3* 9.7* 10.4* 9.7*  HCT 31.8* 29.4* 31.9* 28.5*  PLT 274 221 294 274    COAGS: No results for input(s): INR, APTT in the last 8760 hours.  BMP: Recent Labs    01/24/20 1111 08/02/20 1552 08/16/20 0029 08/21/20 1735  NA 140 138 134* 134*  K 4.3 3.8 3.9 3.6  CL 105 104 104 100  CO2 25 22 23 25   GLUCOSE 112* 96 94 102*  BUN 10 9 10 7   CALCIUM 9.0 9.3 9.1 9.1  CREATININE 0.82 0.58 0.58 0.60  GFRNONAA >60 >60 >60 >60  GFRAA >60  --   --   --     LIVER FUNCTION TESTS: Recent Labs    01/24/20 1111 08/02/20 1552 08/16/20 0029 08/21/20 1735  BILITOT 1.3* 0.5 1.2  0.8  AST 42* 13* 10* 10*  ALT 19 14 9 9   ALKPHOS 70 97 116 128*  PROT 6.8 6.4* 6.8 6.6  ALBUMIN 3.7 2.9* 2.5* 2.4*    TUMOR MARKERS: No results for input(s): AFPTM, CEA, CA199, CHROMGRNA in the last 8760 hours.  Assessment and Plan: Posterior epidural mass lesion vs fluid collection extending from T10-L3 Imaging reviewed and case d/w neurosurgery. Plan for attempt at fluoro guided aspiration/biopsy. Seems accessible along the left T11-L1 paraspinous region. Discussed with pt we hope to avoid sedation meds but may need to give her something. She states 'my body rejects lidocaine' as in it may not be effective for local anesthetic. She understands the goal of the procedure as well as the risks. Risks and benefits discussed with the patient including bleeding, infection, damage to adjacent structures, and sepsis.  All of the patient's questions were answered, patient is agreeable to proceed. Consent signed and in chart.  Thank you for this interesting consult.  I greatly enjoyed meeting Jaziya Obarr and look forward to participating in their care.  A copy of this report was sent to the requesting provider on this date.  Electronically Signed: Ascencion Dike, PA-C 08/22/2020, 12:48 PM   I spent a total of 20 in face to face in clinical consultation, greater than 50% of which was counseling/coordinating care for paraspinous/posterior epidural mass/collection

## 2020-08-22 NOTE — Progress Notes (Addendum)
1135: PC from [facility] in radiology asking if the patient had eaten breakfast. Patient stating that she had a bite of froot loops around 0800, and has been sipping on water and apple juice all morning, last around 11am when she returned from MRI. Per Lennette Bihari, please keep her NPO until POC is determined. Patient instructed and verbalized understanding.

## 2020-08-23 ENCOUNTER — Encounter (HOSPITAL_COMMUNITY): Payer: Self-pay | Admitting: Neurological Surgery

## 2020-08-23 ENCOUNTER — Inpatient Hospital Stay (HOSPITAL_COMMUNITY): Payer: Medicaid Other

## 2020-08-23 DIAGNOSIS — M4620 Osteomyelitis of vertebra, site unspecified: Secondary | ICD-10-CM

## 2020-08-23 DIAGNOSIS — R7881 Bacteremia: Secondary | ICD-10-CM

## 2020-08-23 DIAGNOSIS — M4625 Osteomyelitis of vertebra, thoracolumbar region: Secondary | ICD-10-CM | POA: Diagnosis not present

## 2020-08-23 DIAGNOSIS — F413 Other mixed anxiety disorders: Secondary | ICD-10-CM

## 2020-08-23 DIAGNOSIS — Z3A34 34 weeks gestation of pregnancy: Secondary | ICD-10-CM

## 2020-08-23 DIAGNOSIS — G062 Extradural and subdural abscess, unspecified: Secondary | ICD-10-CM | POA: Diagnosis not present

## 2020-08-23 DIAGNOSIS — B9562 Methicillin resistant Staphylococcus aureus infection as the cause of diseases classified elsewhere: Secondary | ICD-10-CM

## 2020-08-23 DIAGNOSIS — O99353 Diseases of the nervous system complicating pregnancy, third trimester: Secondary | ICD-10-CM

## 2020-08-23 DIAGNOSIS — O99213 Obesity complicating pregnancy, third trimester: Secondary | ICD-10-CM

## 2020-08-23 DIAGNOSIS — O98813 Other maternal infectious and parasitic diseases complicating pregnancy, third trimester: Secondary | ICD-10-CM

## 2020-08-23 DIAGNOSIS — G9520 Unspecified cord compression: Secondary | ICD-10-CM

## 2020-08-23 DIAGNOSIS — O99343 Other mental disorders complicating pregnancy, third trimester: Secondary | ICD-10-CM

## 2020-08-23 LAB — CBC
HCT: 27.7 % — ABNORMAL LOW (ref 36.0–46.0)
Hemoglobin: 9.3 g/dL — ABNORMAL LOW (ref 12.0–15.0)
MCH: 29.8 pg (ref 26.0–34.0)
MCHC: 33.6 g/dL (ref 30.0–36.0)
MCV: 88.8 fL (ref 80.0–100.0)
Platelets: 280 10*3/uL (ref 150–400)
RBC: 3.12 MIL/uL — ABNORMAL LOW (ref 3.87–5.11)
RDW: 15.8 % — ABNORMAL HIGH (ref 11.5–15.5)
WBC: 12.2 10*3/uL — ABNORMAL HIGH (ref 4.0–10.5)
nRBC: 0.2 % (ref 0.0–0.2)

## 2020-08-23 LAB — SURGICAL PCR SCREEN
MRSA, PCR: NEGATIVE
Staphylococcus aureus: NEGATIVE

## 2020-08-23 LAB — ECHOCARDIOGRAM COMPLETE

## 2020-08-23 MED ORDER — POLYETHYLENE GLYCOL 3350 17 G PO PACK
17.0000 g | PACK | Freq: Every day | ORAL | Status: DC | PRN
  Administered 2020-08-27 – 2020-08-28 (×2): 17 g via ORAL
  Filled 2020-08-23 (×3): qty 1

## 2020-08-23 MED ORDER — DOCUSATE SODIUM 100 MG PO CAPS
100.0000 mg | ORAL_CAPSULE | Freq: Two times a day (BID) | ORAL | Status: DC
  Administered 2020-08-23 – 2020-08-29 (×14): 100 mg via ORAL
  Filled 2020-08-23 (×14): qty 1

## 2020-08-23 MED ORDER — OXYCODONE HCL 5 MG/5ML PO SOLN: 5.0000 mg | Freq: Once | ORAL | Status: DC | PRN

## 2020-08-23 MED ORDER — ONDANSETRON HCL 4 MG PO TABS
4.0000 mg | ORAL_TABLET | Freq: Four times a day (QID) | ORAL | Status: DC | PRN
  Administered 2020-08-29: 4 mg via ORAL
  Filled 2020-08-23: qty 1

## 2020-08-23 MED ORDER — SODIUM CHLORIDE 0.9% FLUSH
3.0000 mL | Freq: Two times a day (BID) | INTRAVENOUS | Status: DC
  Administered 2020-08-24: 3 mL via INTRAVENOUS

## 2020-08-23 MED ORDER — THROMBIN 5000 UNITS EX SOLR
OROMUCOSAL | Status: DC | PRN
  Administered 2020-08-22: 5 mL via TOPICAL

## 2020-08-23 MED ORDER — ONDANSETRON HCL 4 MG/2ML IJ SOLN: 4.0000 mg | Freq: Once | INTRAMUSCULAR | Status: DC | PRN

## 2020-08-23 MED ORDER — PHENOL 1.4 % MT LIQD: 1.0000 | OROMUCOSAL | Status: DC | PRN

## 2020-08-23 MED ORDER — ACETAMINOPHEN 650 MG RE SUPP
650.0000 mg | RECTAL | Status: DC | PRN
  Filled 2020-08-23: qty 1

## 2020-08-23 MED ORDER — BUPIVACAINE HCL (PF) 0.5 % IJ SOLN
INTRAMUSCULAR | Status: DC | PRN
  Administered 2020-08-22: 5 mL

## 2020-08-23 MED ORDER — ONDANSETRON HCL 4 MG/2ML IJ SOLN
4.0000 mg | Freq: Four times a day (QID) | INTRAMUSCULAR | Status: DC | PRN
  Administered 2020-08-28 – 2020-08-29 (×3): 4 mg via INTRAVENOUS
  Filled 2020-08-23 (×3): qty 2

## 2020-08-23 MED ORDER — SENNA 8.6 MG PO TABS
1.0000 | ORAL_TABLET | Freq: Two times a day (BID) | ORAL | Status: DC
  Administered 2020-08-23 – 2020-08-29 (×14): 8.6 mg via ORAL
  Filled 2020-08-23 (×15): qty 1

## 2020-08-23 MED ORDER — VANCOMYCIN HCL 10 G IV SOLR
2000.0000 mg | INTRAVENOUS | Status: DC
  Filled 2020-08-23: qty 2000

## 2020-08-23 MED ORDER — SODIUM CHLORIDE 0.9 % IV SOLN
2.0000 g | Freq: Two times a day (BID) | INTRAVENOUS | Status: DC
  Administered 2020-08-23: 2 g via INTRAVENOUS
  Filled 2020-08-23 (×4): qty 20

## 2020-08-23 MED ORDER — VANCOMYCIN HCL 10 G IV SOLR
2250.0000 mg | Freq: Two times a day (BID) | INTRAVENOUS | Status: DC
  Administered 2020-08-23 – 2020-08-25 (×4): 2250 mg via INTRAVENOUS
  Filled 2020-08-23 (×2): qty 2250
  Filled 2020-08-23: qty 2000
  Filled 2020-08-23 (×2): qty 2250
  Filled 2020-08-23: qty 2000

## 2020-08-23 MED ORDER — LIDOCAINE-EPINEPHRINE 1 %-1:100000 IJ SOLN
INTRAMUSCULAR | Status: DC | PRN
  Administered 2020-08-22: 5 mL

## 2020-08-23 MED ORDER — BISACODYL 10 MG RE SUPP
10.0000 mg | Freq: Every day | RECTAL | Status: DC | PRN
  Filled 2020-08-23: qty 1

## 2020-08-23 MED ORDER — SODIUM CHLORIDE 0.9% FLUSH
3.0000 mL | INTRAVENOUS | Status: DC | PRN
  Administered 2020-08-24 – 2020-08-25 (×3): 3 mL via INTRAVENOUS

## 2020-08-23 MED ORDER — CEFAZOLIN SODIUM-DEXTROSE 2-4 GM/100ML-% IV SOLN
2.0000 g | Freq: Three times a day (TID) | INTRAVENOUS | Status: DC
  Filled 2020-08-23: qty 100

## 2020-08-23 MED ORDER — FENTANYL CITRATE (PF) 100 MCG/2ML IJ SOLN
INTRAMUSCULAR | Status: AC
  Filled 2020-08-23: qty 2

## 2020-08-23 MED ORDER — SUGAMMADEX SODIUM 200 MG/2ML IV SOLN
INTRAVENOUS | Status: DC | PRN
  Administered 2020-08-23: 200 mg via INTRAVENOUS

## 2020-08-23 MED ORDER — FLEET ENEMA 7-19 GM/118ML RE ENEM
1.0000 | ENEMA | Freq: Once | RECTAL | Status: AC | PRN
  Administered 2020-08-28: 1 via RECTAL

## 2020-08-23 MED ORDER — OXYCODONE HCL 5 MG PO TABS: 5.0000 mg | ORAL_TABLET | Freq: Once | ORAL | Status: DC | PRN

## 2020-08-23 MED ORDER — FENTANYL CITRATE (PF) 100 MCG/2ML IJ SOLN
25.0000 ug | INTRAMUSCULAR | Status: DC | PRN
  Administered 2020-08-23 (×2): 50 ug via INTRAVENOUS

## 2020-08-23 MED ORDER — VANCOMYCIN HCL 10 G IV SOLR
2000.0000 mg | INTRAVENOUS | Status: AC
  Administered 2020-08-23: 2000 mg via INTRAVENOUS
  Filled 2020-08-23: qty 2000

## 2020-08-23 MED ORDER — MENTHOL 3 MG MT LOZG
1.0000 | LOZENGE | OROMUCOSAL | Status: DC | PRN
  Filled 2020-08-23: qty 9

## 2020-08-23 MED ORDER — VANCOMYCIN HCL 10 G IV SOLR
2250.0000 mg | Freq: Two times a day (BID) | INTRAVENOUS | Status: DC
  Filled 2020-08-23: qty 2250

## 2020-08-23 MED ORDER — ACETAMINOPHEN 325 MG PO TABS
650.0000 mg | ORAL_TABLET | ORAL | Status: DC | PRN
  Administered 2020-08-23 – 2020-08-30 (×24): 650 mg via ORAL
  Filled 2020-08-23 (×26): qty 2

## 2020-08-23 MED ORDER — DEXAMETHASONE SODIUM PHOSPHATE 10 MG/ML IJ SOLN
INTRAMUSCULAR | Status: DC | PRN
  Administered 2020-08-22: 10 mg via INTRAVENOUS

## 2020-08-23 MED ORDER — 0.9 % SODIUM CHLORIDE (POUR BTL) OPTIME
TOPICAL | Status: DC | PRN
  Administered 2020-08-22: 1000 mL

## 2020-08-23 MED ORDER — ONDANSETRON HCL 4 MG/2ML IJ SOLN
INTRAMUSCULAR | Status: DC | PRN
  Administered 2020-08-23: 4 mg via INTRAVENOUS

## 2020-08-23 MED ORDER — SODIUM CHLORIDE 0.9 % IV SOLN: 250.0000 mL | INTRAVENOUS | Status: DC

## 2020-08-23 NOTE — Op Note (Signed)
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  She had been complaining of significant pain and was noted to have this collection on an MRI of her lumbar spine.  The upper portion of the collection could not be seen and the second MRI was performed which demonstrated spread of this epidural collection into the paraspinous space.  This was needle biopsied and found to be pus.  She is taken to the operating room to [facility] formal surgical decompression of the epidural space.  Procedure: Patient was brought to the operating room supine on the stretcher.  After the smooth induction of general endotracheal anesthesia she was turned to the left lateral decubitus position and the patient was taped into position.  The bony prominences were appropriately padded and protected.  The back was then prepped with alcohol DuraPrep and draped in a sterile fashion.  The site of the previous needle biopsy which was at T12-L1 was noted.  The incision was based on this area and after infiltrating the subcutaneous tissue with 10 cc of lidocaine with epinephrine we started by making a vertical incision on the patient's side.  Then the dissection was carried down through the superficial tissues to the posterior spinous processes at T10-T11-T12 and L1.  Left-sided para musculature was dissected in a subperiosteal fashion.  Then while dissecting in the subperiosteal tissues pus was encountered at the T10-T11 and T11-T12.  This was carefully irrigated away.  The tissues overlying the interlaminar space were then cleared and a hemilaminectomy was created at the T11-T12 level.  The yellow ligament was taken up and the epidural space  was entered and immediately significant pus under pressure was relieved.  This was then explored further and it was noted that much of the epidural fat had been converted to grumous material and epidural bleeding ensued.  Bipolar cautery was used to control it.  Then a red Robinson catheter was used in the epidural space to retrieve more of the pus from the superior aspects.  There was noted to be some lipemic tissue along with the pus that was evacuated also.  Ultimately the dura could be identified after resecting some of the epidural grumous material.  Catheter was passed cephalad and inferiorly.  Once this yielded a clear effluent a second laminotomy was created inferiorly and further dissection was created revealing further pus inferiorly.  Because of the patient's position and her pregnancy we want to limit the decompression as much as we can to 90 minutes.  We continued the decompression inferiorly and exposes much of the interlaminar space as we could and passed a ventricular catheter in the epidural space several times down to about the level of L2.  This yielded some pussy fluid.  In the end the fluid and effluent was clear.  His decide believe a small Jackson-Pratt drain in the paraspinous tissues.  This was brought out through a separate stab incision.  The retractors were then removed and the lumbodorsal fascia and thoracodorsal fascia was closed with #1 Vicryl 2-0 Vicryl was used in the subcutaneous tissues and 3-0 Vicryl subcuticularly.  Dermabond was placed on the skin.  Blood loss was estimated at about 50 cc.

## 2020-08-23 NOTE — Discharge Planning (Signed)
RNCM consulted regarding durable medical equipment (DME) needs.  RNCM will follow-up after PT evaluation and recommendations.    Dare Spillman J. Clydene Laming, RN, BSN, NCM  Transitions of Care  Nurse Case Manager  [facility]  Operative Services  [phone]

## 2020-08-23 NOTE — Transfer of Care (Signed)
Immediate Anesthesia Transfer of Care Note  Patient: Colleen Valentine  Procedure(s) Performed: THORACIC TWELVE TO LUMBAR ONELAMINECTOMY FOR DRAINAGE OF EPIDURAL ABSCESS (N/A )  Patient Location: PACU  Anesthesia Type:General  Level of Consciousness: sedated  Airway & Oxygen Therapy: Patient Spontanous Breathing  Post-op Assessment: Report given to RN and Post -op Vital signs reviewed and stable  Post vital signs: Reviewed and stable  Last Vitals:  Vitals Value Taken Time  BP 128/81 08/23/20 0115  Temp    Pulse 131 08/23/20 0117  Resp 21 08/23/20 0117  SpO2 94 % 08/23/20 0117  Vitals shown include unvalidated device data.  Last Pain:  Vitals:   08/22/20 2100  TempSrc:   PainSc: 3       Patients Stated Pain Goal: 3 (69/67/89 3810)  Complications: No complications documented.

## 2020-08-23 NOTE — Evaluation (Signed)
Physical Therapy Evaluation Patient Details Name: Colleen Valentine MRN: [id number] DOB: [birth year] Today's Date: 08/23/2020   History of Present Illness  Pt is a 25 y/o female [redacted] weeks gestation who presents with T10-L3 epidural abscess on 08/22/2020. She is now s/p T11-L1 laminotomies with decompression of epidural abscess.   PMH significant for suicide attempt, seizures, polysubstance abuse, herpes, acute pyelonephritis.  Clinical Impression  Pt admitted with above diagnosis. At the time of PT eval, pt was able to demonstrate transfers and ambulation with gross min guard assist to supervision for safety and IV pole/HHA for support. Pt will benefit from a RW and I will bring one to her room to use until she gets one to take home. Pt was educated on precautions, brace application/wearing schedule, appropriate activity progression, and car transfer. Pt currently with functional limitations due to the deficits listed below (see PT Problem List). Pt will benefit from skilled PT to increase their independence and safety with mobility to allow discharge to the venue listed below.      Follow Up Recommendations No PT follow up;Supervision for mobility/OOB    Equipment Recommendations  Rolling walker with 5" wheels;Other (comment) Media planner)    Recommendations for Other Services       Precautions / Restrictions Precautions Precautions: Fall;Back Precaution Booklet Issued: Yes (comment) Precaution Comments: Reviewed handout in detail and pt was cued for precautions during functional mobility Required Braces or Orthoses:  ("no brace needed" order)      Mobility  Bed Mobility               General bed mobility comments: Pt was received sitting up EOB. Verbally reviewed log roll technique.    Transfers Overall transfer level: Needs assistance Equipment used: None Transfers: Sit to/from [facility] Sit to Stand: Supervision Stand pivot transfers: Supervision        General transfer comment: Close supervision for safety. No assist required for power-up to full stand. VC's for improved posture and wide BOS when initiating.  Ambulation/Gait Ambulation/Gait assistance: Min guard Gait Distance (Feet): 200 Feet Assistive device: IV Pole;1 person hand held assist Gait Pattern/deviations: Step-through pattern;Decreased stride length;Wide base of support Gait velocity: Decreased Gait velocity interpretation: <1.31 ft/sec, indicative of household ambulator General Gait Details: Slow and mildly unsteady. Pt held IV pole with LUE and HHA from therapist for [place]. No overt LOB noted.  Stairs            Wheelchair Mobility    Modified Rankin (Stroke Patients Only)       Balance Overall balance assessment: Needs assistance Sitting-balance support: Feet supported;No upper extremity supported Sitting balance-Leahy Scale: Fair     Standing balance support: No upper extremity supported;During functional activity Standing balance-Leahy Scale: Fair Standing balance comment: Dynamically, benefits from UE support                             Pertinent Vitals/Pain Pain Assessment: 0-10 Pain Score: 7  Pain Location: Incision site and both hips Pain Descriptors / Indicators: Operative site guarding;Dull;Stabbing Pain Intervention(s): Limited activity within patient's tolerance;Monitored during session;Repositioned;Ice applied    Home Living Family/patient expects to be discharged to:: Private residence Living Arrangements: Spouse/significant other Available Help at Discharge: Family;Available PRN/intermittently Type of Home: Mobile home Home Access: Stairs to enter Entrance Stairs-Rails: Right;Left (Not sturdy) Entrance Stairs-Number of Steps: 7 Home Layout: One level Home Equipment: Walker - 2 wheels;Toilet riser;Hand held shower head  Prior Function Level of Independence: Independent               Hand Dominance         Extremity/Trunk Assessment   Upper Extremity Assessment Upper Extremity Assessment: Overall WFL for tasks assessed    Lower Extremity Assessment Lower Extremity Assessment: Generalized weakness (Consistent with pre-op diagnosis. Pt endorses falls PTA.)    Cervical / Trunk Assessment Cervical / Trunk Assessment: Other exceptions Cervical / Trunk Exceptions: s/p surgery  Communication   Communication: No difficulties  Cognition Arousal/Alertness: Awake/alert Behavior During Therapy: WFL for tasks assessed/performed Overall Cognitive Status: Within Functional Limits for tasks assessed                                        General Comments      Exercises     Assessment/Plan    PT Assessment Patient needs continued PT services  PT Problem List Decreased strength;Decreased activity tolerance;Decreased balance;Decreased mobility;Decreased knowledge of use of DME;Decreased safety awareness;Decreased knowledge of precautions;Pain       PT Treatment Interventions DME instruction;Gait training;Stair training;Functional mobility training;Therapeutic activities;Therapeutic exercise;Neuromuscular re-education;Patient/family education    PT Goals (Current goals can be found in the Care Plan section)  Acute Rehab PT Goals Patient Stated Goal: Decrease pain, be safe at home PT Goal Formulation: With patient Time For Goal Achievement: 08/30/20 Potential to Achieve Goals: Good    Frequency Min 5X/week   Barriers to discharge Decreased caregiver support Pt reports she essentially lives alone as partner works a lot.    Co-evaluation               AM-PAC PT "6 Clicks" Mobility  Outcome Measure Help needed turning from your back to your side while in a flat bed without using bedrails?: None Help needed moving from lying on your back to sitting on the side of a flat bed without using bedrails?: A Little Help needed moving to and from a bed to a chair (including  a wheelchair)?: A Little Help needed standing up from a chair using your arms (e.g., wheelchair or bedside chair)?: A Little Help needed to walk in hospital room?: A Little Help needed climbing 3-5 steps with a railing? : A Little 6 Click Score: 19    End of Session Equipment Utilized During Treatment: Gait belt Activity Tolerance: Patient tolerated treatment well Patient left: in chair;with call bell/phone within reach;with family/visitor present Nurse Communication: Mobility status PT Visit Diagnosis: Unsteadiness on feet (R26.81);Pain Pain - part of body:  (back)    Time: 1132-1220 PT Time Calculation (min) (ACUTE ONLY): 48 min   Charges:   PT Evaluation $PT Eval Moderate Complexity: 1 Mod PT Treatments $Gait Training: 23-37 mins        Rolinda Roan, PT, DPT Acute Rehabilitation Services Pager: [phone] Office: [phone]   Thelma Comp 08/23/2020, 1:02 PM

## 2020-08-23 NOTE — Progress Notes (Signed)
OB team requested anesthesia consult to discuss management for planned c section in future given recently T12-L1 laminectomy and drainage of epidural abscess. D/w patient that she will have general anesthesia, which will not allow her to see the delivery of the infant in real time but she will be able to see the baby as soon as she wakes up after surgery. Pt is accepting of the plan, all questions answered at the bedside.

## 2020-08-23 NOTE — OR Nursing (Signed)
[place] RN PRESENT IN OR TO MONITOR FETAL HEART RATE

## 2020-08-23 NOTE — Progress Notes (Signed)
Patient ID: Colleen Valentine, female   DOB: 11-15-1995, 25 y.o.   MRN: [id number] Vital signs are stable Patient complains of back pain, not unexpectedly. Motor function appears to be modestly improved I believe the patient can be ambulated and physical therapy is due to see her Drain has moderate output at this point we will continue to leave the drain in place the dressing appears dry and intact.

## 2020-08-23 NOTE — Consult Note (Addendum)
Colleen Valentine is a 25 y.o. female admitted on 08/21/2020  2:38 PM  with epidural abscess. Pharmacy has been consulted for Vancomycin dosing.   Plan: Vancomycin 2250mg  IV q12h. Goal AUC 400-550. Calculated AUC 519.1 Vanc peak is ordered for 3/31 1830  Vanc trough is ordered for 4/1 0230  Ht Readings from Last 1 Encounters:  07/29/20 5\' 5"  (1.651 m)       Patient weight not recorded   Temp: 99.2 F (37.3 C) (03/30 0115) Temp Source: Oral (03/29 1946) BP: 128/78 (03/30 0145) Pulse Rate: 122 (03/30 0145)   Recent Labs    08/22/20 0513  WBC 12.0*   CrCl cannot be calculated (Unknown ideal weight.).  Allergies: Patient has no known allergies.   Antimicrobials this admission: Vancomycin 3/30 >>    Microbiology results: 3/29 WoundCx pending 3/29 MRSA PCR: -  Thank you for allowing pharmacy to be a part of this patient's care.  Burns Spain 08/23/2020 1:51 AM

## 2020-08-23 NOTE — Progress Notes (Signed)
  Echocardiogram 2D Echocardiogram has been performed.  Colleen Valentine 08/23/2020, 5:44 PM

## 2020-08-23 NOTE — Discharge Planning (Signed)
Fuller Mandril, RN, BSN, [place] [phone] Pt qualifies for DME rolling walker and 3n1.  DME  ordered through [facility].  Freda Munro of [facility] notified to deliver DME to pt room prior to D/C home.

## 2020-08-23 NOTE — Anesthesia Procedure Notes (Signed)
Procedure Name: Intubation Date/Time: 08/22/2020 11:26 PM Performed by: Valetta Fuller, CRNA Pre-anesthesia Checklist: Patient identified, Emergency Drugs available, Suction available and Patient being monitored Patient Re-evaluated:Patient Re-evaluated prior to induction Oxygen Delivery Method: Circle system utilized Preoxygenation: Pre-oxygenation with 100% oxygen Induction Type: IV induction, Rapid sequence and Cricoid Pressure applied Laryngoscope Size: Miller and 2 Grade View: Grade I Tube type: Oral Tube size: 7.0 mm Number of attempts: 1 Airway Equipment and Method: Stylet Placement Confirmation: ETT inserted through vocal cords under direct vision,  positive ETCO2 and breath sounds checked- equal and bilateral Secured at: 22 cm Tube secured with: Tape Dental Injury: Teeth and Oropharynx as per pre-operative assessment

## 2020-08-23 NOTE — Progress Notes (Signed)
Pt. back in room with support person and is alert and oriented x 4. Ambulated to [facility] with nurse and nurse tech. Tolerated activity fair.

## 2020-08-23 NOTE — Progress Notes (Signed)
OBRR called to Neuro OR to monitor pregnant patient during procedure. Pt placed on external monitor and OBRR remained at bedside throughout procedure. At end of procedure Dr. Ilda Basset called and order received to dc monitoring and pt to be transfers to PACU then back to [facility] and will need NST once back to [facility] room. [facility] RN called and notified about plan of care.

## 2020-08-23 NOTE — Procedures (Signed)
Patient cannot turn on left side for echo at this time.

## 2020-08-23 NOTE — Consult Note (Addendum)
[name]    Date of Admission:  08/21/2020     Reason for Consult: 3rd trimester pregnancy, thoracolumbar mass vs fluid collection with vertebral om (t10-L3)    Referring Provider: Ilda Basset    Lines:  Peripheral iv's  Abx: 3/29-c vanc 3/29-c ceftriaxone         Assessment: 25 yo female 3rd trimester pregnancy, recent (2/16 and 2/23 OSH admission [facility]) left axillary abscess/MRSA bacteremia,  admitted 3/28 for acute progressive back pain of 1 month, with imaging showing heterogenous mass t10-L3 concerning for tumor vs infection, along with LE neurologic deficit  Patient s/p open laminectomy by NSG with improvement in LE numbness/weakness on 3/29. The operative culture is showing GPC in pairs. Pathology is pending. Appears c/w a hematogenous infectious process  Patient made an astute connection the abscess/mrsa bacteremia is related to her lower back. I agree her back process is likely mrsa. It didn't appear she was appropriately treated previously at the time she developed the left axillary abscess/mrsa bacteremia (3 day vanc/7 more day bactrim for what was complicated mrsa bacteremia).  I discussed with her too it would be prudent to get at least TTE to make sure we have a baseline of the valve structure (vertebral om/abscess tx duration will cover if endocarditis present), for baseline in case future evaluation is needed   Plan: 1. Will send blood cultures, although she already have been getting abx 2. Stop ceftriaxone. Continue vancomycin 3. F/u operative culture; likely mrsa 4. TTE  Active Problems:   Morbid obesity with BMI of 40.0-44.9, adult (HCC)   Spinal cord compression (HCC)   [redacted] weeks gestation of pregnancy   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HCC)   Scheduled Meds: . docusate sodium  100 mg Oral BID  . lamoTRIgine  25 mg Oral BID  . mupirocin ointment  1 application Nasal BID  . pantoprazole  40 mg Oral Daily  . prenatal  multivitamin  1 tablet Oral Q1200  . senna  1 tablet Oral BID  . sodium chloride flush  3 mL Intravenous Q12H  . sodium chloride flush  3 mL Intravenous Q12H  . tetracaine  200 mg Infiltration Once  . valACYclovir  500 mg Oral BID   Continuous Infusions: . sodium chloride    . cefTRIAXone (ROCEPHIN)  IV 2 g (08/23/20 0620)  . lactated ringers 125 mL/hr at 08/22/20 2202  . vancomycin     PRN Meds:.acetaminophen **OR** acetaminophen, bisacodyl, bisacodyl, calcium carbonate, cyclobenzaprine, HYDROmorphone (DILAUDID) injection, LORazepam, menthol-cetylpyridinium **OR** phenol, ondansetron **OR** ondansetron (ZOFRAN) IV, polyethylene glycol, sodium chloride flush, sodium phosphate, tetracaine, zolpidem  HPI: Colleen Valentine is a 25 y.o. female 3rd trimester pregnancy admitted 3/28 for 2 weeks lower back pain and legs weakness, found to have t10-l3 epidural abscess/vertebral om   Patient reports she had left axillary abscess. She was seen at [facility] ED on 2/17 and had it lanced there. It appears they gave her cephalexin post procedure but she doesn't recall taking. She developed fever for a week, and returned there 2/23 when she was admitted and underwent surgical I&D of the left axillary abscess. Her admission bcx grew mrsa in 3 sets of blood cx. There was no repeat blood culture done. The wound culture appear to have been lost on 2/23. The initial 2/17 wound cx was negative. She reported she had back pain during the 2/23 admission but said she was told it was due to pregnancy so  didn't get imaging. She was ultimately discharged with 7 days of bactrim after the 3 day stay on vancomycin  She continues to have progressive lower back pain. She didn't reports further fever though after the [facility] admission. Patient seen in [facility] [place] 3/20 but unable to do mri.  Has been getting flexeril. Sx worse and admitted 3/28  No fever documented here Wbc a little elevated at 12  Initial mri thoracolumbar  spine suggestive of tumor vs infectious process  Patient underwent open I&D/laminectomy 3/29. I reviewed operative note/finding which is consistent with epidural abscess.  Her fetus is in good health per ob assessment  She is currently on vanc/ceftriaxone The operative cx is showing gpc in pairs There is no blood culture obtained  Post surgery, legs weakness/numbness better No urine/bowel incontinence No cough/chest pain/rash No other joint pain No headache No visual change She is constipated  She denies ivdu. She does use occasional marijuanna She [facility]. Her hiv screen has been negative  Review of Systems: ROS Other ros negative  Past Medical History:  Diagnosis Date  . Acute pyelonephritis 05/17/2018  . Anemia   . Anxiety   . Complication of anesthesia   . Depression   . GERD (gastroesophageal reflux disease)   . Herpes genitalia   . Insomnia   . Obesity   . Polysubstance abuse ([place]) 04/01/2011  . PONV (postoperative nausea and vomiting)   . 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 Suicide [facility])     Social History   Tobacco Use  . Smoking status: Former Smoker    Packs/day: 0.50    Years: 5.00    Pack years: 2.50    Types: Cigarettes    Quit date: 11/24/2019    Years since quitting: 0.7  . Smokeless tobacco: Never Used  . Tobacco comment: one pack cigarettes daily  Vaping Use  . Vaping Use: Some days  Substance Use Topics  . Alcohol use: Not Currently    Alcohol/week: 0.0 standard drinks    Comment: social  . Drug use: Yes    Types: Marijuana    Comment: SMOKED TODAY    Family History  Problem Relation Age of Onset  . Alcohol abuse Mother   . Stroke Mother   . Bipolar disorder Mother   . Drug abuse Mother   . Alcohol abuse Father   . Bipolar disorder Father   . Drug abuse Father   . Colon cancer Maternal Grandfather   . Crohn's disease Paternal Grandmother   . Crohn's disease Paternal Aunt   .  Crohn's disease Paternal Aunt   . Crohn's disease Paternal Uncle   . Colon cancer Paternal Uncle   . Cancer Maternal Aunt        "stomach cancer"  . Celiac disease Neg Hx    No Known Allergies  OBJECTIVE: Blood pressure (!) 106/55, pulse (!) 103, temperature 98.1 F (36.7 C), temperature source Oral, resp. rate 18, last menstrual period 12/14/2019, SpO2 97 %, unknown if currently breastfeeding.  Physical Exam Well developed; no distress; fully conversant Heent: per; conj clear; eomi Neck supple cv rrr no mrg Lungs clear abd gravid; nontender; soft Ext no edema Skin no rash msk thoraco/lumbar surgical site dressing clean; no surrounding swelling/erythema/tenderness; the jp drain is showing serosanguinous fluid Neuro: cn2-12 intact; bilateral LE strength intact Psych alert/oriented  Lab Results Lab Results  Component Value Date   WBC 12.2 (  H) 08/23/2020   HGB 9.3 (L) 08/23/2020   HCT 27.7 (L) 08/23/2020   MCV 88.8 08/23/2020   PLT 280 08/23/2020    Lab Results  Component Value Date   CREATININE 0.60 08/21/2020   BUN 7 08/21/2020   NA 134 (L) 08/21/2020   K 3.6 08/21/2020   CL 100 08/21/2020   CO2 25 08/21/2020    Lab Results  Component Value Date   ALT 9 08/21/2020   AST 10 (L) 08/21/2020   ALKPHOS 128 (H) 08/21/2020   BILITOT 0.8 08/21/2020     Microbiology: Recent Results (from the past 240 hour(s))  Culture, OB Urine     Status: Abnormal   Collection Time: 08/15/20 11:50 PM   Specimen: OB Clean Catch; Urine  Result Value Ref Range Status   Specimen Description OB CLEAN CATCH  Final   Special Requests NONE  Final   Culture (A)  Final    <10,000 COLONIES/mL INSIGNIFICANT GROWTH NO GROUP B STREP (S.AGALACTIAE) ISOLATED Performed at [facility], [address], [place], [place] [address]    Report Status 08/17/2020 FINAL  Final  Resp Panel by RT-PCR (Flu A&B, Covid) Nasopharyngeal Swab     Status: None   Collection Time: 08/21/20  9:03 PM    Specimen: Nasopharyngeal Swab; Nasopharyngeal(NP) swabs in vial transport medium  Result Value Ref Range Status   SARS Coronavirus 2 by RT PCR NEGATIVE NEGATIVE Final    Comment: (NOTE) SARS-CoV-2 target nucleic acids are NOT DETECTED.  The SARS-CoV-2 RNA is generally detectable in upper respiratory specimens during the acute phase of infection. The lowest concentration of SARS-CoV-2 viral copies this assay can detect is 138 copies/mL. A negative result does not preclude SARS-Cov-2 infection and should not be used as the sole basis for treatment or other patient management decisions. A negative result may occur with  improper specimen collection/handling, submission of specimen other than nasopharyngeal swab, presence of viral mutation(s) within the areas targeted by this assay, and inadequate number of viral copies(<138 copies/mL). A negative result must be combined with clinical observations, patient history, and epidemiological information. The expected result is Negative.  Fact Sheet for Patients:  EntrepreneurPulse.com.au  Fact Sheet for Healthcare Providers:  IncredibleEmployment.be  This test is no t yet approved or cleared by the [place] FDA and  has been authorized for detection and/or diagnosis of SARS-CoV-2 by FDA under an Emergency Use Authorization (EUA). This EUA will remain  in effect (meaning this test can be used) for the duration of the COVID-19 declaration under Section 564(b)(1) of the Act, 21 U.S.C.section 360bbb-3(b)(1), unless the authorization is terminated  or revoked sooner.       Influenza A by PCR NEGATIVE NEGATIVE Final   Influenza B by PCR NEGATIVE NEGATIVE Final    Comment: (NOTE) The Xpert Xpress SARS-CoV-2/FLU/RSV plus assay is intended as an aid in the diagnosis of influenza from Nasopharyngeal swab specimens and should not be used as a sole basis for treatment. Nasal washings and aspirates are  unacceptable for Xpert Xpress SARS-CoV-2/FLU/RSV testing.  Fact Sheet for Patients: EntrepreneurPulse.com.au  Fact Sheet for Healthcare Providers: IncredibleEmployment.be  This test is not yet approved or cleared by the [place] FDA and has been authorized for detection and/or diagnosis of SARS-CoV-2 by FDA under an Emergency Use Authorization (EUA). This EUA will remain in effect (meaning this test can be used) for the duration of the COVID-19 declaration under Section 564(b)(1) of the Act, 21 U.S.C. section 360bbb-3(b)(1), unless  the authorization is terminated or revoked.  Performed at [facility], [place] [address], [place], [place] [address]   Aerobic/Anaerobic Culture w Gram Stain (surgical/deep wound)     Status: None (Preliminary result)   Collection Time: 08/22/20  5:47 PM   Specimen: Wound  Result Value Ref Range Status   Specimen Description WOUND BACK  Final   Special Requests NONE  Final   Gram Stain   Final    NO WBC SEEN MODERATE GRAM POSITIVE COCCI IN PAIRS    Culture   Final    TOO YOUNG TO READ Performed at [facility], [address] [place], [place] [address]    Report Status PENDING  Incomplete  Surgical pcr screen     Status: None   Collection Time: 08/22/20 10:35 PM  Result Value Ref Range Status   MRSA, PCR NEGATIVE NEGATIVE Final   Staphylococcus aureus NEGATIVE NEGATIVE Final    Comment: (NOTE) The Xpert SA Assay (FDA approved for NASAL specimens in patients 49 years of age and older), is one component of a comprehensive surveillance program. It is not intended to diagnose infection nor to guide or monitor treatment. Performed at [facility], [place] [address] [place], [place] [address]   Aerobic/Anaerobic Culture w Gram Stain (surgical/deep wound)     Status: None (Preliminary result)   Collection Time: 08/23/20 12:08 AM   Specimen: Soft Tissue, Other  Result Value Ref Range Status    Specimen Description TISSUE  Final   Special Requests LUMBAR EPIDURAL ABS  Final   Gram Stain   Final    RARE WBC PRESENT, PREDOMINANTLY PMN FEW GRAM POSITIVE COCCI IN PAIRS Performed at [facility], [place] [address], [place], [place] [address]    Culture PENDING  Incomplete   Report Status PENDING  Incomplete     Serology: 3/05 gc/chlam negative  Micro: 3/30 surgical/deep wound cx back in progress; gram stain gpc in pairs 3/29 surgical/deep wound cx back in progress; gram stain gpc in pairs  OSH 3/23 bcx mrsa (S tetra, bactrim)  Imaging: If present, new imagings (plain films, ct scans, and mri) have been personally visualized and interpreted; radiology reports have been reviewed. Decision making incorporated into the Impression / Recommendations.  3/28 cxr Subsegmental atelectasis in the lung bases. No other acute cardiopulmonary abnormality.  3/28 lumbar spine mri noncontrast 1. 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 The superior collection is favored epidural in location and the inferior is favored to be subdural in location. The superior collection is heterogeneous, indeterminate but possibly hemorrhage given recent trauma. The inferior collection is more homogeneous, indeterminate but possibly CSF. When combined with a posterior disc bulge, there is severe canal stenosis at L3-L4. There is moderate to severe stenosis at T11-T12 through L2-L3 and moderate canal stenosis at L4-L5. Unfortunately, the patient cannot receive contrast to further characterize given pregnancy. 2. Abnormal edema in the left paraspinal soft tissues in the lower thoracic spine. This may represent contusion/strain, but is incompletely imaged on this study. Recommend MRI of the thoracic spine to further evaluate this finding, evaluate  for any other traumatic findings, and characterize the cranial extent of the canal Collection.  3/29 thoracic spine mri noncontrast The patient's posterior epidural lesion extends from T10-L3 and infiltrates the left more than right foramina with  left more than right paravertebral and paraspinous involvement. The left T11-12 facet is eroded but no regional marrow edema as would be expected for an inciting septic arthritis. It is uncertain if this is tumor or purulent collection.  Jabier Mutton, [place] [facility] [place] [phone] pager    08/23/2020, 3:37 PM

## 2020-08-23 NOTE — Progress Notes (Signed)
[place]) NOTE  Colleen Valentine is a 25 y.o. [phone] with Estimated Date of Delivery: 10/01/20   By  best clinical estimate [redacted]w[redacted]d  who is admitted for back pain/spinal cord compression now with epidural abscess s/p drainage.    Fetal presentation is cephalic. Length of Stay:  2  Days  Date of admission:08/21/2020  Subjective: Pt notes considerable back pain this am, improved with medication.  She does note improvement in her LE weakness.  With assistance she has been able to use bedside commode. From an OB standpoint, she reports no acute complaints Patient reports the fetal movement as active. Patient reports uterine contraction  activity as none. Patient reports  vaginal bleeding as none. Patient describes fluid per vagina as None.  Vitals:  Blood pressure (!) 103/56, pulse 96, temperature 97.8 F (36.6 C), temperature source Oral, resp. rate 18, last menstrual period 12/14/2019, SpO2 95 %, unknown if currently breastfeeding. Vitals:   08/23/20 0215 08/23/20 0225 08/23/20 0300 08/23/20 0918  BP: 127/80 127/80 117/76 (!) 103/56  Pulse: (!) 118 (!) 116 (!) 114 96  Resp: 18 19 18 18   Temp:  98.7 F (37.1 C) 97.9 F (36.6 C) 97.8 F (36.6 C)  TempSrc:   Oral Oral  SpO2: 93% 92% 92% 95%   Physical Examination:  General appearance - alert, well appearing, and in no distress Chest - clear to auscultation, no wheezes, rales or rhonchi, symmetric air entry Heart - RRR Abdomen -gravid, obese, soft and non-tender Back: JP drain in place draining sanguineous fluid Extremities - no pedal edema noted, pt able to flex toes and move legs without difficulty this am Skin - warm and dry    Fetal Monitoring:  Baseline: 140 bpm, Variability: moderate, Accelerations: +accels, and Decelerations: Absent    reactive  Labs:  Results for orders placed or performed during the hospital encounter of 08/21/20 (from the past 24 hour(s))  Aerobic/Anaerobic Culture w Gram Stain  (surgical/deep wound)   Collection Time: 08/22/20  5:47 PM   Specimen: Wound  Result Value Ref Range   Specimen Description WOUND BACK    Special Requests NONE    Gram Stain      NO WBC SEEN MODERATE GRAM POSITIVE COCCI IN PAIRS    Culture      TOO YOUNG TO READ Performed at [facility], [address], [place], [place] [address]    Report Status PENDING   Surgical pcr screen   Collection Time: 08/22/20 10:35 PM  Result Value Ref Range   MRSA, PCR NEGATIVE NEGATIVE   Staphylococcus aureus NEGATIVE NEGATIVE  Aerobic/Anaerobic Culture w Gram Stain (surgical/deep wound)   Collection Time: 08/23/20 12:08 AM   Specimen: Soft Tissue, Other  Result Value Ref Range   Specimen Description TISSUE    Special Requests LUMBAR EPIDURAL ABS    Gram Stain      RARE WBC PRESENT, PREDOMINANTLY PMN FEW GRAM POSITIVE COCCI IN PAIRS Performed at [facility], [place] [address] [place], [place] [address]    Culture PENDING    Report Status PENDING   CBC   Collection Time: 08/23/20  7:43 AM  Result Value Ref Range   WBC 12.2 (H) 4.0 - 10.5 K/uL   RBC 3.12 (L) 3.87 - 5.11 MIL/uL   Hemoglobin 9.3 (L) 12.0 - 15.0 g/dL   HCT 27.7 (L) 36.0 - 46.0 %   MCV 88.8 80.0 - 100.0 fL   MCH 29.8 26.0 - 34.0 pg  MCHC 33.6 30.0 - 36.0 g/dL   RDW 15.8 (H) 11.5 - 15.5 %   Platelets 280 150 - 400 K/uL   nRBC 0.2 0.0 - 0.2 %    Imaging Studies:     [place] MFM OB DETAIL +14 WK  Result Date: 08/22/2020 ----------------------------------------------------------------------  OBSTETRICS REPORT                    (Corrected Final 08/22/2020 03:16 pm) ---------------------------------------------------------------------- Patient Info  ID #:       [id number]                          D.O.B.:  [birth year] (24 yrs)  Name:       Colleen Valentine                    Visit Date: 08/22/2020 01:14 pm ---------------------------------------------------------------------- Performed By  Attending:        Tama High Valentine         Ref. Address:     [place]                                                             [place], [place]  Performed By:     Valda Favia          Secondary Phy.:   [facility] MAU/Triage                    RDMS  Referred By:      Shelle Iron              [facility]                    [place] ---------------------------------------------------------------------- Orders  #  Description                           Code        Ordered By  1  [place] MFM OB DETAIL +14 WK               09323.55    [place] ----------------------------------------------------------------------  #  Order #                     Accession #  Episode #  1  [id number]                   [id number]                 [id number] ---------------------------------------------------------------------- Indications  Medical complication of pregnancy (spinal      O26.90  tumor)  [redacted] weeks gestation of pregnancy                O2D.74  Obesity complicating pregnancy, third          O99.213  trimester  Gestational diabetes in pregnancy, diet        O24.410  controlled  Encounter for antenatal screening for          Z36.3  malformations ---------------------------------------------------------------------- Fetal Evaluation  Num Of Fetuses:         1  Fetal Heart Rate(bpm):  148  Cardiac Activity:       Observed  Presentation:           Cephalic  Placenta:               Anterior  P. Cord Insertion:      Visualized, central  Amniotic Fluid  AFI FV:      Within normal limits  AFI Sum(cm)     %Tile       Largest Pocket(cm)  13.2            43          5.2  RUQ(cm)       RLQ(cm)       LUQ(cm)        LLQ(cm)  4             2.4           1.6            5.2 ---------------------------------------------------------------------- Biometry  BPD:      81.3  mm     G. Age:   32w 5d         10  %    CI:        72.91   %    70 - 86                                                          FL/HC:      22.4   %    19.4 - 21.8  HC:      302.7  mm     G. Age:  33w 4d          7  %    HC/AC:      0.89        0.96 - 1.11  AC:      338.6  mm     G. Age:  37w 5d       > 99  %    FL/BPD:     83.3   %    71 - 87  FL:       67.7  mm     G. Age:  34w 6d         55  %    FL/AC:      20.0   %    20 - 24  Est. FW:  2819  gm      6 lb 3 oz     89  % ---------------------------------------------------------------------- OB History  Gravidity:    5          SAB:   2  TOP:          1        Living:  1 ---------------------------------------------------------------------- Gestational Age  LMP:           34w 2d        Date:  12/26/19                 EDD:   10/01/20  U/S Today:     34w 5d                                        EDD:   09/28/20  Best:          34w 2d     Det. By:  LMP  (12/26/19)          EDD:   10/01/20 ---------------------------------------------------------------------- Anatomy  Cranium:               Appears normal         LVOT:                   Not well visualized  Cavum:                 Appears normal         Aortic Arch:            Not well visualized  Ventricles:            Previously seen        Ductal Arch:            Not well visualized  Choroid Plexus:        Previously seen        Diaphragm:              Previously seen  Cerebellum:            Previously seen        Stomach:                Appears normal, left                                                                        sided  Posterior Fossa:       Previously seen        Abdomen:                Previously seen  Nuchal Fold:           Not applicable (>19    Abdominal Wall:         Not well visualized                         wks GA)  Face:                  Appears normal  Cord Vessels:           Previously seen                         (orbits and profile)  Lips:                  Appears normal         Kidneys:                 Appear normal  Palate:                Not well visualized    Bladder:                Appears normal  Thoracic:              Appears normal         Spine:                  Not well visualized  Heart:                 Previously seen        Upper Extremities:      Visualized  RVOT:                  Appears normal         Lower Extremities:      Visualized  Other:  Technically difficult due to advanced GA and fetal position. ---------------------------------------------------------------------- Cervix Uterus Adnexa  Cervix  Not visualized (advanced GA >24wks)  Uterus  No abnormality visualized.  Right Ovary  No adnexal mass visualized.  Left Ovary  No adnexal mass visualized.  Cul De Sac  No free fluid seen.  Adnexa  No abnormality visualized. ---------------------------------------------------------------------- Impression  Patient is admitted with c/o backpain and numbness in the  legs. 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 Neurosurgery  consult was obtained  and biopsy of the mass is being  planned.  Fetal growth is appropriate for gestational age. Amniotic fluid  is normal. Cephhalic presentation.  I discussed with Dr. Nehemiah Settle (before ultrasound). It is  reasonable to plan the following: ---------------------------------------------------------------------- Recommendations  -If the tumor is malignant and urgent surgical treatment is  required for the benefit of the mother, cesarean delivery can  be performed anytime from now. I presume neurosurgeons  would prefer to perform surgery after delivery and not during  pregnancy.  -If tumor is benign, then the delivery decision should be  dictated by the effect of tumor compression and possible  neurological complications. If surgery can be performed in  pregnancy, delivery at term is reasonable. If urgent  decompression surgery is required, cesarean delivery can be  performed anytime.  -Patient had  received betamethasone on 03/05 and 07/30/20.  Rescue course is NOT indicated. ----------------------------------------------------------------------                       Colleen High, Valentine Electronically Signed Corrected Final Report  08/22/2020 03:16 pm ----------------------------------------------------------------------    I have reviewed the patient's current medications.  ASSESSMENT: [id number] [redacted]w[redacted]d Estimated Date of Delivery: 10/01/20 admitted due to back pain/spinal cord compression diagnosed with epidural abscess s/p drainage on 3/29  PLAN: 1) Epidural abscess s/p drainage- management per neurosurgery -currently JP drain in place -plan for IV vancomycin x 6wks -cultures pending -ID consulted  2) FWB- reassuring- Cat. I -  continue q shift NST -Briefly discussed her concerns regarding timing of delivery and route.  Currently there is not a medical indication for imminent delivery- will review with MFM and discussed that ultimately will will be to deliver at term  3) Maternal well being -Anxiety/Depression- currently on Lamictal 25mg  bid -PNV daily -valtrex bid -advance diet as tolerated -per neurosurgery- plan to work with PT on ambulation and return to regular activity  DISP:For now continue in-house management as above, will plan to work with transition of care time for home IV antibiotic management  Janyth Pupa, DO Attending [place], [place] for [employer], [place]

## 2020-08-24 DIAGNOSIS — R531 Weakness: Secondary | ICD-10-CM | POA: Diagnosis not present

## 2020-08-24 LAB — CBC
HCT: 24.9 % — ABNORMAL LOW (ref 36.0–46.0)
Hemoglobin: 8.1 g/dL — ABNORMAL LOW (ref 12.0–15.0)
MCH: 29.9 pg (ref 26.0–34.0)
MCHC: 32.5 g/dL (ref 30.0–36.0)
MCV: 91.9 fL (ref 80.0–100.0)
Platelets: 234 10*3/uL (ref 150–400)
RBC: 2.71 MIL/uL — ABNORMAL LOW (ref 3.87–5.11)
RDW: 15.9 % — ABNORMAL HIGH (ref 11.5–15.5)
WBC: 7.9 10*3/uL (ref 4.0–10.5)
nRBC: 0 % (ref 0.0–0.2)

## 2020-08-24 LAB — CREATININE, SERUM
Creatinine, Ser: 0.55 mg/dL (ref 0.44–1.00)
GFR, Estimated: >60 mL/min (ref >60)

## 2020-08-24 NOTE — Progress Notes (Signed)
Patient ID: Colleen Valentine, female   DOB: [birth year], 25 y.o.   MRN: [id number] Vital signs are stable Drainage still greater than 50 cc/day We will leave the drain in for today Patient is feeling better and leg strength is improving though not quite back to normal yet Complains of back pain from surgery Overall she is improving steadily.

## 2020-08-24 NOTE — Progress Notes (Signed)
[place]) NOTE  Colleen Valentine is a 25 y.o. [phone] with Estimated Date of Delivery: 10/01/20   By  best clinical estimate [redacted]w[redacted]d admitted for back pain/spinal cord compression s/p drainage of epidural abscess on 3/30 (early am).    Fetal presentation is cephalic. Length of Stay:  3  Days  Date of admission:08/21/2020  Subjective: Reports that she still has considerable back pain, but slowly improving.  Doing well with current pain medication.  Tolerating gen diet.  Voiding without difficulty, last BM on 3/28 after enema.   Patient reports the fetal movement as active. Patient reports uterine contraction  activity as none. Patient reports  vaginal bleeding as none. Patient describes fluid per vagina as None.  Vitals:  Blood pressure 104/64, pulse (!) 110, temperature 97.8 F (36.6 C), temperature source Oral, resp. rate 20, last menstrual period 12/14/2019, SpO2 97 %, unknown if currently breastfeeding. Vitals:   08/23/20 2004 08/23/20 2134 08/24/20 0329 08/24/20 0807  BP: (!) 88/49 121/66 95/61 104/64  Pulse: (!) 109 (!) 111 (!) 112 (!) 110  Resp: 18  20   Temp: 97.7 F (36.5 C)  97.7 F (36.5 C) 97.8 F (36.6 C)  TempSrc: Oral  Oral Oral  SpO2: 99%  95% 97%   Physical Examination:  General appearance - alert, well appearing, and in no distress Chest - CTAB Heart - normal rate and regular rhythm Abdomen - soft and non-tender, no rebound, no guarding Extremities - no pedal edema noted Skin - warm and dry  Fetal Monitoring:  Baseline: 150 bpm, Variability: moderate, Accelerations: +accels, and Decelerations: Absent    reactive  Labs:  Results for orders placed or performed during the hospital encounter of 08/21/20 (from the past 24 hour(s))  Culture, blood (routine x 2)   Collection Time: 08/23/20  4:13 PM   Specimen: BLOOD LEFT HAND  Result Value Ref Range   Specimen Description BLOOD LEFT HAND    Special Requests      BOTTLES DRAWN AEROBIC  AND ANAEROBIC Blood Culture adequate volume   Culture      NO GROWTH < 24 HOURS Performed at [facility], [address] [place], [place] [address]    Report Status PENDING   Culture, blood (routine x 2)   Collection Time: 08/23/20  4:14 PM   Specimen: BLOOD  Result Value Ref Range   Specimen Description BLOOD LEFT ANTECUBITAL    Special Requests      BOTTLES DRAWN AEROBIC AND ANAEROBIC Blood Culture adequate volume   Culture      NO GROWTH < 24 HOURS Performed at [facility], [place] [address] [place], [place] [address]    Report Status PENDING   ECHOCARDIOGRAM COMPLETE   Collection Time: 08/23/20  5:44 PM  Result Value Ref Range   BP 106/55 mmHg  CBC   Collection Time: 08/24/20  4:24 AM  Result Value Ref Range   WBC 7.9 4.0 - 10.5 K/uL   RBC 2.71 (L) 3.87 - 5.11 MIL/uL   Hemoglobin 8.1 (L) 12.0 - 15.0 g/dL   HCT 24.9 (L) 36.0 - 46.0 %   MCV 91.9 80.0 - 100.0 fL   MCH 29.9 26.0 - 34.0 pg   MCHC 32.5 30.0 - 36.0 g/dL   RDW 15.9 (H) 11.5 - 15.5 %   Platelets 234 150 - 400 K/uL   nRBC 0.0 0.0 - 0.2 %  Creatinine, serum   Collection Time: 08/24/20  4:24 AM  Result Value  Ref Range   Creatinine, Ser 0.55 0.44 - 1.00 mg/dL   GFR, Estimated >60 >60 mL/min    I have reviewed the patient's current medications.  ASSESSMENT: [id number] [redacted]w[redacted]d Estimated Date of Delivery: 10/01/20 admitted due to back pain/spinal cord compression diagnosed with epidural abscess s/p drainage on 3/30 (early am)  PLAN: 1) Epidural abscess s/p drainage- management per neurosurgery -currently JP drain in place -plan for IV vancomycin x 6wks -cultures gram + cocci -ID consulted, ECHO completed 3/30  2) FWB- reassuring- Cat. I  -continue q shift NST -s/p BMZ 3/23  3) Maternal well being -Anxiety/Depression- currently on Lamictal 25mg  bid -PNV daily -valtrex bid -advance diet as tolerated -per neurosurgery- plan to work with PT on ambulation and return to regular  activity  DISP:For now continue in-house management as above, will plan to work with transition of care time for home IV antibiotic management.  Discussed plan to transition to [facility] care at [place] 08/24/2020,10:16 AM

## 2020-08-24 NOTE — Anesthesia Postprocedure Evaluation (Signed)
Anesthesia Post Note  Patient: Colleen Valentine  Procedure(s) Performed: THORACIC TWELVE TO LUMBAR ONELAMINECTOMY FOR DRAINAGE OF EPIDURAL ABSCESS (N/A )     Patient location during evaluation: PACU Anesthesia Type: General Level of consciousness: awake and alert Pain management: pain level controlled Vital Signs Assessment: post-procedure vital signs reviewed and stable Respiratory status: spontaneous breathing, nonlabored ventilation, respiratory function stable and patient connected to nasal cannula oxygen Cardiovascular status: blood pressure returned to baseline and stable Postop Assessment: no apparent nausea or vomiting Anesthetic complications: no   No complications documented.  Last Vitals:  Vitals:   08/23/20 2134 08/24/20 0329  BP: 121/66 95/61  Pulse: (!) 111 (!) 112  Resp:  20  Temp:  36.5 C  SpO2:  95%    Last Pain:  Vitals:   08/24/20 0712  TempSrc:   PainSc: 8                  Asiah Browder S

## 2020-08-24 NOTE — Progress Notes (Signed)
Physical Therapy Treatment Patient Details Name: Colleen Valentine MRN: [id number] DOB: 1996/03/10 Today's Date: 08/24/2020    History of Present Illness Pt is a 25 y/o female [redacted] weeks gestation who presents with T10-L3 epidural abscess on 08/22/2020. She is now s/p T11-L1 laminotomies with decompression of epidural abscess.   PMH significant for suicide attempt, seizures, polysubstance abuse, herpes, acute pyelonephritis.    PT Comments    Pt progressing well with post-op mobility. She was able to demonstrate transfers and ambulation with gross min guard assist to supervision for safety with RW for support. Pt was educated on precautions, appropriate activity progression, and general safety/problem solving with home set up and managing with precautions once discharged. Will continue to follow.      Follow Up Recommendations  No PT follow up;Supervision for mobility/OOB     Equipment Recommendations  Rolling walker with 5" wheels;Other (comment) Media planner)    Recommendations for Other Services       Precautions / Restrictions Precautions Precautions: Fall;Back Precaution Booklet Issued: Yes (comment) Precaution Comments: Reviewed handout in detail and pt was cued for precautions during functional mobility Required Braces or Orthoses:  ("no brace needed" order) Restrictions Weight Bearing Restrictions: No    Mobility  Bed Mobility               General bed mobility comments: Pt was received sitting up in the recliner.    Transfers Overall transfer level: Needs assistance Equipment used: None Transfers: Sit to/from Stand Sit to Stand: Supervision         General transfer comment: Close supervision for safety. No assist required for power-up to full stand. VC's for improved posture and wide BOS when initiating both stand and sit.  Ambulation/Gait Ambulation/Gait assistance: Min guard;Supervision Gait Distance (Feet): 350 Feet Assistive device: Rolling walker (2  wheeled) Gait Pattern/deviations: Step-through pattern;Decreased stride length;Wide base of support Gait velocity: Decreased Gait velocity interpretation: <1.31 ft/sec, indicative of household ambulator General Gait Details: Slow and mildly unsteady. Intermittent min guard assist, mainly as pt fatigued. Reports feeling like her RLE is going to give out on her, however no buckling apparent throughout gait.   Stairs             Wheelchair Mobility    Modified Rankin (Stroke Patients Only)       Balance Overall balance assessment: Needs assistance Sitting-balance support: Feet supported;No upper extremity supported Sitting balance-Leahy Scale: Fair     Standing balance support: No upper extremity supported;During functional activity Standing balance-Leahy Scale: Fair Standing balance comment: Dynamically, benefits from UE support                            Cognition Arousal/Alertness: Awake/alert Behavior During Therapy: WFL for tasks assessed/performed Overall Cognitive Status: Within Functional Limits for tasks assessed                                        Exercises      General Comments        Pertinent Vitals/Pain Pain Assessment: Faces Faces Pain Scale: Hurts little more Pain Location: R hip/thigh, back Pain Descriptors / Indicators: Operative site guarding;Dull;Stabbing Pain Intervention(s): Limited activity within patient's tolerance;Monitored during session;Repositioned;Ice applied    Home Living  Prior Function            PT Goals (current goals can now be found in the care plan section) Acute Rehab PT Goals Patient Stated Goal: Decrease pain, be safe at home PT Goal Formulation: With patient Time For Goal Achievement: 08/30/20 Potential to Achieve Goals: Good Progress towards PT goals: Progressing toward goals    Frequency    Min 5X/week      PT Plan Current plan remains  appropriate    Co-evaluation              AM-PAC PT "6 Clicks" Mobility   Outcome Measure  Help needed turning from your back to your side while in a flat bed without using bedrails?: None Help needed moving from lying on your back to sitting on the side of a flat bed without using bedrails?: A Little Help needed moving to and from a bed to a chair (including a wheelchair)?: A Little Help needed standing up from a chair using your arms (e.g., wheelchair or bedside chair)?: A Little Help needed to walk in hospital room?: A Little Help needed climbing 3-5 steps with a railing? : A Little 6 Click Score: 19    End of Session Equipment Utilized During Treatment: Gait belt Activity Tolerance: Patient tolerated treatment well Patient left: in chair;with call bell/phone within reach;with family/visitor present Nurse Communication: Mobility status PT Visit Diagnosis: Unsteadiness on feet (R26.81);Pain Pain - part of body:  (back)     Time: 7989-2119 PT Time Calculation (min) (ACUTE ONLY): 27 min  Charges:  $Gait Training: 8-22 mins                     Rolinda Roan, PT, DPT Acute Rehabilitation Services Pager: [phone] Office: [phone]    Thelma Comp 08/24/2020, 12:21 PM

## 2020-08-25 ENCOUNTER — Inpatient Hospital Stay: Payer: Self-pay

## 2020-08-25 ENCOUNTER — Inpatient Hospital Stay (HOSPITAL_BASED_OUTPATIENT_CLINIC_OR_DEPARTMENT_OTHER): Payer: Medicaid Other

## 2020-08-25 DIAGNOSIS — R7881 Bacteremia: Secondary | ICD-10-CM | POA: Diagnosis not present

## 2020-08-25 DIAGNOSIS — O4693 Antepartum hemorrhage, unspecified, third trimester: Secondary | ICD-10-CM

## 2020-08-25 DIAGNOSIS — O288 Other abnormal findings on antenatal screening of mother: Secondary | ICD-10-CM

## 2020-08-25 DIAGNOSIS — O98813 Other maternal infectious and parasitic diseases complicating pregnancy, third trimester: Secondary | ICD-10-CM | POA: Diagnosis not present

## 2020-08-25 DIAGNOSIS — Z3A34 34 weeks gestation of pregnancy: Secondary | ICD-10-CM | POA: Diagnosis not present

## 2020-08-25 DIAGNOSIS — M4625 Osteomyelitis of vertebra, thoracolumbar region: Secondary | ICD-10-CM | POA: Diagnosis not present

## 2020-08-25 DIAGNOSIS — B9562 Methicillin resistant Staphylococcus aureus infection as the cause of diseases classified elsewhere: Secondary | ICD-10-CM | POA: Diagnosis not present

## 2020-08-25 LAB — CBC
HCT: 26.1 % — ABNORMAL LOW (ref 36.0–46.0)
Hemoglobin: 8.5 g/dL — ABNORMAL LOW (ref 12.0–15.0)
MCH: 29.7 pg (ref 26.0–34.0)
MCHC: 32.6 g/dL (ref 30.0–36.0)
MCV: 91.3 fL (ref 80.0–100.0)
Platelets: 258 10*3/uL (ref 150–400)
RBC: 2.86 MIL/uL — ABNORMAL LOW (ref 3.87–5.11)
RDW: 15.9 % — ABNORMAL HIGH (ref 11.5–15.5)
WBC: 7.2 10*3/uL (ref 4.0–10.5)
nRBC: 0 % (ref 0.0–0.2)

## 2020-08-25 LAB — CULTURE, BETA STREP (GROUP B ONLY)

## 2020-08-25 MED ORDER — SODIUM CHLORIDE 0.9 % IV SOLN
900.0000 mg | Freq: Every day | INTRAVENOUS | Status: DC
  Administered 2020-08-25 – 2020-09-03 (×10): 900 mg via INTRAVENOUS
  Filled 2020-08-25 (×13): qty 18

## 2020-08-25 MED ORDER — ENOXAPARIN SODIUM 40 MG/0.4ML ~~LOC~~ SOLN: 40.0000 mg | SUBCUTANEOUS | Status: DC

## 2020-08-25 MED ORDER — ENOXAPARIN SODIUM 60 MG/0.6ML ~~LOC~~ SOLN
60.0000 mg | SUBCUTANEOUS | Status: DC
  Administered 2020-08-25 – 2020-08-29 (×5): 60 mg via SUBCUTANEOUS
  Filled 2020-08-25 (×5): qty 0.6

## 2020-08-25 NOTE — Progress Notes (Signed)
PHARMACY CONSULT NOTE FOR:  OUTPATIENT  PARENTERAL ANTIBIOTIC THERAPY (OPAT)  Indication: epidural abscess  Regimen: Daptomycin 900mg  IV q24h End date: 10/18/2020  IV antibiotic discharge orders are pended. To discharging provider:  please sign these orders via discharge navigator,  Select New Orders & click on the button choice - Manage This Unsigned Work.     Thank you for allowing pharmacy to be a part of this patient's care.  Candie Mile 08/25/2020, 9:51 AM

## 2020-08-25 NOTE — Progress Notes (Signed)
Physical Therapy Treatment Patient Details Name: Colleen Valentine MRN: [id number] DOB: 01/05/1996 Today's Date: 08/25/2020    History of Present Illness Pt is a 25 y/o female [redacted] weeks gestation who presents with T10-L3 epidural abscess on 08/22/2020. She is now s/p T11-L1 laminotomies with decompression of epidural abscess.   PMH significant for suicide attempt, seizures, polysubstance abuse, herpes, acute pyelonephritis.    PT Comments    Pt progressing towards goals, however, continues to complain of throbbing type back pain. Also presenting with LLE tremors following gait. Mild unsteadiness, requiring min guard A for safety. Reviewed back precautions and generalized walking program. Current recommendations appropriate. Will continue to follow acutely.     Follow Up Recommendations  No PT follow up;Supervision for mobility/OOB     Equipment Recommendations  Rolling walker with 5" wheels;Other (comment);3in1 (PT) Media planner)    Recommendations for Other Services       Precautions / Restrictions Precautions Precautions: Fall;Back Precaution Booklet Issued: Yes (comment) Precaution Comments: Reviewed spinal precautions with pt. Required Braces or Orthoses:  ("no brace needed" order) Restrictions Weight Bearing Restrictions: No    Mobility  Bed Mobility Overal bed mobility: Needs Assistance Bed Mobility: Supine to Sit;Sit to Supine     Supine to sit: Min guard;HOB elevated Sit to supine: Min guard;HOB elevated   General bed mobility comments: Used helicopter technique for bed mobility with HOB elevated.    Transfers Overall transfer level: Needs assistance Equipment used: Rolling walker (2 wheeled) Transfers: Sit to/from Stand Sit to Stand: Supervision         General transfer comment: Close supervision for safety. No assist required for power-up to full stand. Increased time required to come to standing.  Ambulation/Gait Ambulation/Gait assistance: Min  guard;Supervision Gait Distance (Feet): 350 Feet Assistive device: Rolling walker (2 wheeled) Gait Pattern/deviations: Step-through pattern;Decreased stride length;Wide base of support Gait velocity: Decreased   General Gait Details: Slow and mildly unsteady. Intermittent min guard assist, mainly as pt fatigued.Pt reporting throbbing back pain and required standing rest during middle of gait. Educated about walking program for home.   Stairs             Wheelchair Mobility    Modified Rankin (Stroke Patients Only)       Balance Overall balance assessment: Needs assistance Sitting-balance support: Feet supported;No upper extremity supported Sitting balance-Leahy Scale: Fair     Standing balance support: No upper extremity supported;During functional activity Standing balance-Leahy Scale: Fair Standing balance comment: Dynamically, benefits from UE support                            Cognition Arousal/Alertness: Awake/alert Behavior During Therapy: WFL for tasks assessed/performed Overall Cognitive Status: Within Functional Limits for tasks assessed                                        Exercises      General Comments        Pertinent Vitals/Pain Pain Assessment: Faces Faces Pain Scale: Hurts even more Pain Location: R hip/thigh, back Pain Descriptors / Indicators: Operative site guarding;Dull;Stabbing;Throbbing    Home Living                      Prior Function            PT Goals (current goals can now be found  in the care plan section) Acute Rehab PT Goals Patient Stated Goal: Decrease pain, be safe at home PT Goal Formulation: With patient Time For Goal Achievement: 08/30/20 Potential to Achieve Goals: Good Progress towards PT goals: Progressing toward goals    Frequency    Min 5X/week      PT Plan Current plan remains appropriate    Co-evaluation              AM-PAC PT "6 Clicks" Mobility    Outcome Measure  Help needed turning from your back to your side while in a flat bed without using bedrails?: None Help needed moving from lying on your back to sitting on the side of a flat bed without using bedrails?: A Little Help needed moving to and from a bed to a chair (including a wheelchair)?: A Little Help needed standing up from a chair using your arms (e.g., wheelchair or bedside chair)?: A Little Help needed to walk in hospital room?: A Little Help needed climbing 3-5 steps with a railing? : A Little 6 Click Score: 19    End of Session Equipment Utilized During Treatment: Gait belt Activity Tolerance: Patient tolerated treatment well Patient left: with call bell/phone within reach;in bed Nurse Communication: Mobility status PT Visit Diagnosis: Unsteadiness on feet (R26.81);Pain Pain - part of body:  (back)     Time: 3524-8185 PT Time Calculation (min) (ACUTE ONLY): 27 min  Charges:  $Gait Training: 23-37 mins                     Colleen Valentine, DPT  Acute Rehabilitation Services  Pager: [phone] Office: [phone]    Colleen Valentine 08/25/2020, 4:20 PM

## 2020-08-25 NOTE — Progress Notes (Signed)
Patient ID: Colleen Valentine, female   DOB: 01-02-1996, 25 y.o.   MRN: [id number] Vital signs are stable Patient appears to be doing well and she notes that the numbness is further resolved Motor strength is improving steadily I believe that she would be okay to shower if IV can be protected She should continue to mobilize I will see her in the beginning of the week Contact neurosurgery on call if there is any problems over the weekend

## 2020-08-25 NOTE — Progress Notes (Addendum)
[place]) NOTE  Colleen Valentine is a 25 y.o. [id number] with Estimated Date of Delivery: 10/01/20   By  best clinical estimate [redacted]w[redacted]d  who is admitted for back pain/spinal cord compression s/p drainage of epidural abscess on 3/30 (early am).    Fetal presentation is cephalic. Length of Stay:  4  Days  Date of admission:08/21/2020  Subjective: This am she noted when she went to the bathroom- noted about a plum-sized clot of mucus-like blood and when she wiped a few times bright red blood.  She does note some lower pelvic pain that she describes as cramping- seems to come and go- not sure how often.  Denies vaginal discharge of fluid.  Patient reports the fetal movement as active.  She also notes pain on her left upper abdomen- denies SOB or trouble breathing.    Vitals:  Blood pressure 103/65, pulse (!) 110, temperature 97.9 F (36.6 C), temperature source Oral, resp. rate 18, height 5\' 5"  (1.651 m), weight 112.8 kg, last menstrual period 12/14/2019, SpO2 98 %, unknown if currently breastfeeding. Vitals:   08/24/20 1956 08/25/20 0034 08/25/20 0524 08/25/20 0734  BP: 126/66 (!) 95/52 105/71 103/65  Pulse: (!) 106 (!) 104 (!) 111 (!) 110  Resp: 18 20 18 18   Temp: 98.3 F (36.8 C) 97.9 F (36.6 C) 98.3 F (36.8 C) 97.9 F (36.6 C)  TempSrc: Oral Oral Oral Oral  SpO2: 98% 99% 97% 98%  Weight:      Height:       Physical Examination:  General appearance - alert, well appearing, no distress Psych: seems anxious Chest - CTAB, notes pain with palpation along left lower ribs- describes the pain as sharp Heart - normal rate and regular rhythm Abdomen - obese, gravid, no rebound, no guarding, soft and non-tender SVE- exam completed on side- normal external genitalia and vulva exam, cervix closed/long/posterior, bright red mucus-like blood noted on glove chaperoned by Lily Kocher, RN Extremities - no pedal edema noted, no calf tenderness appreciated- no evidence of DVT  on exam Skin - warm and dry   Fetal Monitoring:  Baseline: 150 bpm, Variability: moderate, Accelerations: 10x10 x1, and Decelerations: Absent    nonreactive  Labs:  Results for orders placed or performed during the hospital encounter of 08/21/20 (from the past 24 hour(s))  CBC   Collection Time: 08/25/20  7:57 AM  Result Value Ref Range   WBC 7.2 4.0 - 10.5 K/uL   RBC 2.86 (L) 3.87 - 5.11 MIL/uL   Hemoglobin 8.5 (L) 12.0 - 15.0 g/dL   HCT 26.1 (L) 36.0 - 46.0 %   MCV 91.3 80.0 - 100.0 fL   MCH 29.7 26.0 - 34.0 pg   MCHC 32.6 30.0 - 36.0 g/dL   RDW 15.9 (H) 11.5 - 15.5 %   Platelets 258 150 - 400 K/uL   nRBC 0.0 0.0 - 0.2 %   I have reviewed the patient's current medications.  ASSESSMENT: [id number] [redacted]w[redacted]d admitted due to back pain/spinal cord compression diagnosed with epidural abscess s/p drainage on 3/30 (early am) this am with vaginal bleeding  PLAN: 1)Epidural abscess s/p drainage- management per neurosurgery -currently JP drain in place- possible removal today -initially on IV Vanc- started on 3/30, transitioned today to IV Daptomycin- plan for 6wk of treatment -ID consulted, ECHO completed 3/30 -PICC line to be placed  2) Vaginal bleeding -pt previously admitted for similar concern early March- received BMZ at that time and was monitored with resolution of bleeding -no  contractions noted on toco, plan to closely monitor bleeding- if persists or worsens may consider moving towards delivery.  -pt voided again- no bleeding noted.  For now plan to closely monitor in house for 5 days from bleeding  3) Fetal well-being -Nonreactive NST this am, BPP ordered -BMZ previously given at prior admission  3) Maternal well being -Anxiety/Depression- currently on Lamictal 25mg  bid -PNV daily -valtrex bid -general diet -continues to work with PT on ambulation and return to regular activity -DVT prophylaxis- post drain removal will start Lovenox- reviewed with pharmacy  DISP:  Antenatal management as above, transition of care team on-board.  If maternal and fetal status stable, may consider discharge home next Wednesday  Janyth Pupa, DO Attending [place], [facility] for [facility], [place]

## 2020-08-25 NOTE — Progress Notes (Signed)
Spoke with nurse about PICC placement. Princess, RN states patient had her drain removed today and patient has not been able to get comfortable. Patient states she is not able to tolerate laying flat and is requesting to wait for tomorrow to have PICC placed. Per RN, patient not due to be discharged until next week. Patient will be reassessed tomorrow for PICC placement.

## 2020-08-25 NOTE — Care Management Note (Addendum)
Case Management Note  Patient Details  Name: Colleen Valentine MRN: [id number] Date of Birth: 10/20/1995  Subjective/Objective:                  Illness Pt is a 25 y/o female [redacted] weeks gestation who presents with T10-L3 epidural abscess on 08/22/2020. She is now s/p T11-L1 laminotomies with decompression of epidural abscess.   PMH significant for suicide attempt, seizures, polysubstance abuse, herpes, acute pyelonephritis     Action/Plan:  IV antibiotics for 6 weeks  Discharge planning Services  CM Consult   DME Arranged:  3-N-1,Walker rolling,Shower stool DME Agency:  [facility]- arranged on 3/30 by C. W. Case Manager  HH Arranged:  Pharmacy/RN [place] Agency:  [facility]  Additional Comments: CM spoke to Dr. Nelda Marseille that plan is for patient to receive IV antibiotics till 10/18/20.  Plan is for patient to go home with IV antibiotics, per MD at this time.  CM reached out to patient and she did not have choice with agency.  Patient said she plans to stay with her girlfriend- Colleen Valentine and her address is: [address]  [place], [place] , [address].  Patient's phone number is correct in epic.  CM reached out to Carolynn Sayers with [facility] to start process for [facility] IV antibiotics in the home. She accepted referral.  Patient in agreement and  verbalized understanding of plan.  Plan is for Daptomycin every 24 hours with an end date of 10/18/20 OPAT consult put end by pharmacy.  CM will continue to follow.   [name] RNC-MNN, BSN Transitions of Care Pediatrics/Women's and [place]

## 2020-08-25 NOTE — Progress Notes (Signed)
[facility] for Infectious Disease  Date of Admission:  08/21/2020     CC: Vertebral OM, epidural abscess, hx mrsa bacteremia  Abx: 3/29-c vanc  3/29-3/30 ceftriaxone  ASSESSMENT: 25 yo female 3rd trimester pregnancy, recent (2/16 and 2/23 OSH admission [facility]) left axillary abscess/MRSA bacteremia,  admitted 3/28 for acute progressive back pain of 1 month, with imaging showing heterogenous mass t10-L3 concerning for tumor vs infection, along with LE neurologic deficit  Patient is s/p decompression laminectomy and I&d on 3/29. cx staph aureus She had left axillary abscess/bacteremia with inadequate treatment duration of abx and back pain at that time (07/19/20) that was not investigated. The bsi at that time was MRSA (s doxy/bactrim). There was no tee/tte done at osh in 06/2020 either and she only had 1 week bactrim after 3 days iv vancomycin from 07/19/20   #mrsa bacteremia hx #vertebral om/epidural S/p I&d 3/29; cx mrsa (S tetra/bactrim) Neurologic sx of legs weakness improving 3/30 bcx negative tte done, pending result  -discussed with our pharmacy team/primary team -f/u tte -switch to daptomycin 8 mg/kg; plan 8 weeks from 3/30 until 10/18/20 -will need repeat mri in around 4-5 weeks (will arrange in id clinic f/u) -id and opat f/u as below, once ready to discharge -id will sign off    [place] Per Protocol: Home health RN for IV administration and teaching; PICC line care and labs.    Labs weekly while on IV antibiotics: _x_ CBC with differential __ BMP _x_ CMP _x_ CRP __ ESR __ Vancomycin trough _x_ CK  _x_ Please pull PIC at completion of IV antibiotics __ Please leave PIC in place until doctor has seen patient or been notified  Fax weekly labs to [phone]  Clinic Follow Up Appt: 4/22 @ 1115 with dr Peri [facility] [place], [place], [place] [address] Phone: [phone]    I spent more than 35 minute reviewing  data/chart, and coordinating care and >50% direct face to face time providing counseling/discussing diagnostics/treatment plan with patient   ----------------------------------  Active Problems:   Morbid obesity with BMI of 40.0-44.9, adult ([place])   Spinal cord compression (HCC)   [redacted] weeks gestation of pregnancy   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HCC)   Epidural abscess   Vertebral osteomyelitis (HCC)   MRSA bacteremia   No Known Allergies  Scheduled Meds: . docusate sodium  100 mg Oral BID  . lamoTRIgine  25 mg Oral BID  . mupirocin ointment  1 application Nasal BID  . pantoprazole  40 mg Oral Daily  . prenatal multivitamin  1 tablet Oral Q1200  . senna  1 tablet Oral BID  . sodium chloride flush  3 mL Intravenous Q12H  . tetracaine  200 mg Infiltration Once  . valACYclovir  500 mg Oral BID   Continuous Infusions: . sodium chloride    . lactated ringers 125 mL/hr at 08/22/20 2202  . vancomycin 2,250 mg (08/25/20 0321)   PRN Meds:.acetaminophen **OR** acetaminophen, bisacodyl, bisacodyl, calcium carbonate, cyclobenzaprine, HYDROmorphone (DILAUDID) injection, LORazepam, menthol-cetylpyridinium **OR** phenol, ondansetron **OR** ondansetron (ZOFRAN) IV, polyethylene glycol, sodium chloride flush, sodium phosphate, tetracaine, zolpidem   SUBJECTIVE: LE weakness continues to improve Back pain much improved/controlled No urinary incontinence Some vaginal bleeding; ob following No n/v/diarrhea/rash No f/c Wound cx is showing staph aureus  tte done but pending result   Review of Systems: ROS All other ROS was negative, except mentioned above  OBJECTIVE: Vitals:   08/24/20 1956 08/25/20 0034 08/25/20 0524 08/25/20 0734  BP: 126/66 (!) 95/52 105/71 103/65  Pulse: (!) 106 (!) 104 (!) 111 (!) 110  Resp: _0 Temp: 98.3 F (36.8 C) 97.9 F (36.6 C) 98.3 F (36.8 C) 97.9 F (36.6 C)  TempSrc: Oral Oral Oral Oral  SpO2: 98% 99% 97% 98%  Weight:      Height:        Body mass index is 41.37 kg/m.  Physical Exam General/constitutional: no distress, pleasant HEENT: Normocephalic, PER, Conj Clear, EOMI, Oropharynx clear Neck supple CV: rrr no mrg Lungs: clear to auscultation, normal respiratory effort Abd: Soft, Nontender Ext: no edema Skin: No Rash Neuro: nonfocal MSK: no peripheral joint swelling/tenderness/warmth; back spines nontender; dressing clean/dry/intact   Central line presence: peripheral iv's   Lab Results Lab Results  Component Value Date   WBC 7.2 08/25/2020   HGB 8.5 (L) 08/25/2020   HCT 26.1 (L) 08/25/2020   MCV 91.3 08/25/2020   PLT 258 08/25/2020    Lab Results  Component Value Date   CREATININE 0.55 08/24/2020   BUN 7 08/21/2020   NA 134 (L) 08/21/2020   K 3.6 08/21/2020   CL 100 08/21/2020   CO2 25 08/21/2020    Lab Results  Component Value Date   ALT 9 08/21/2020   AST 10 (L) 08/21/2020   ALKPHOS 128 (H) 08/21/2020   BILITOT 0.8 08/21/2020      Microbiology: Recent Results (from the past 240 hour(s))  Culture, OB Urine     Status: Abnormal   Collection Time: 08/15/20 11:50 PM   Specimen: OB Clean Catch; Urine  Result Value Ref Range Status   Specimen Description OB CLEAN CATCH  Final   Special Requests NONE  Final   Culture (A)  Final    <10,000 COLONIES/mL INSIGNIFICANT GROWTH NO GROUP B STREP (S.AGALACTIAE) ISOLATED Performed at [facility], [address] [place], [place] [address]    Report Status 08/17/2020 FINAL  Final  Resp Panel by RT-PCR (Flu A&B, Covid) Nasopharyngeal Swab     Status: None   Collection Time: 08/21/20  9:03 PM   Specimen: Nasopharyngeal Swab; Nasopharyngeal(NP) swabs in vial transport medium  Result Value Ref Range Status   SARS Coronavirus 2 by RT PCR NEGATIVE NEGATIVE Final    Comment: (NOTE) SARS-CoV-2 target nucleic acids are NOT DETECTED.  The SARS-CoV-2 RNA is generally detectable in upper respiratory specimens during the acute phase of infection.  The lowest concentration of SARS-CoV-2 viral copies this assay can detect is 138 copies/mL. A negative result does not preclude SARS-Cov-2 infection and should not be used as the sole basis for treatment or other patient management decisions. A negative result may occur with  improper specimen collection/handling, submission of specimen other than nasopharyngeal swab, presence of viral mutation(s) within the areas targeted by this assay, and inadequate number of viral copies(<138 copies/mL). A negative result must be combined with clinical observations, patient history, and epidemiological information. The expected result is Negative.  Fact Sheet for Patients:  EntrepreneurPulse.com.au  Fact Sheet for Healthcare Providers:  IncredibleEmployment.be  This test is no t yet approved or cleared by the [place] FDA and  has been authorized for detection and/or diagnosis of SARS-CoV-2 by FDA under an Emergency Use Authorization (EUA). This EUA will remain  in effect (meaning this test can be used) for the duration of the COVID-19 declaration under Section 564(b)(1) of the Act, 21  U.S.C.section 360bbb-3(b)(1), unless the authorization is terminated  or revoked sooner.       Influenza A by PCR NEGATIVE NEGATIVE Final   Influenza B by PCR NEGATIVE NEGATIVE Final    Comment: (NOTE) The Xpert Xpress SARS-CoV-2/FLU/RSV plus assay is intended as an aid in the diagnosis of influenza from Nasopharyngeal swab specimens and should not be used as a sole basis for treatment. Nasal washings and aspirates are unacceptable for Xpert Xpress SARS-CoV-2/FLU/RSV testing.  Fact Sheet for Patients: EntrepreneurPulse.com.au  Fact Sheet for Healthcare Providers: IncredibleEmployment.be  This test is not yet approved or cleared by the [place] FDA and has been authorized for detection and/or diagnosis of SARS-CoV-2 by FDA  under an Emergency Use Authorization (EUA). This EUA will remain in effect (meaning this test can be used) for the duration of the COVID-19 declaration under Section 564(b)(1) of the Act, 21 U.S.C. section 360bbb-3(b)(1), unless the authorization is terminated or revoked.  Performed at [facility], [place] [address] [place], [place] [address]   Aerobic/Anaerobic Culture w Gram Stain (surgical/deep wound)     Status: None (Preliminary result)   Collection Time: 08/22/20  5:47 PM   Specimen: Wound  Result Value Ref Range Status   Specimen Description WOUND BACK  Final   Special Requests NONE  Final   Gram Stain   Final    NO WBC SEEN MODERATE GRAM POSITIVE COCCI IN PAIRS Performed at [facility], [place] [address] [place], [place] [address]    Culture   Final    MODERATE STAPHYLOCOCCUS AUREUS NO ANAEROBES ISOLATED; CULTURE IN PROGRESS FOR 5 DAYS    Report Status PENDING  Incomplete  Culture, beta strep (group b only)     Status: None (Preliminary result)   Collection Time: 08/22/20  8:37 PM   Specimen: Vaginal/Rectal; Genital  Result Value Ref Range Status   Specimen Description VAGINAL/RECTAL  Final   Special Requests NONE  Final   Culture   Final    CULTURE REINCUBATED FOR BETTER GROWTH Performed at [facility], [address] [place], [place] [address]    Report Status PENDING  Incomplete  Surgical pcr screen     Status: None   Collection Time: 08/22/20 10:35 PM  Result Value Ref Range Status   MRSA, PCR NEGATIVE NEGATIVE Final   Staphylococcus aureus NEGATIVE NEGATIVE Final    Comment: (NOTE) The Xpert SA Assay (FDA approved for NASAL specimens in patients 79 years of age and older), is one component of a comprehensive surveillance program. It is not intended to diagnose infection nor to guide or monitor treatment. Performed at [facility], [place] [address], [place], [place] [address]   Aerobic/Anaerobic Culture w Gram Stain (surgical/deep  wound)     Status: None (Preliminary result)   Collection Time: 08/23/20 12:08 AM   Specimen: Soft Tissue, Other  Result Value Ref Range Status   Specimen Description TISSUE  Final   Special Requests LUMBAR EPIDURAL ABS  Final   Gram Stain   Final    RARE WBC PRESENT, PREDOMINANTLY PMN FEW GRAM POSITIVE COCCI IN PAIRS    Culture   Final    FEW STAPHYLOCOCCUS AUREUS SUSCEPTIBILITIES TO FOLLOW Performed at [facility], [place] [address] [place], [place] [address]    Report Status PENDING  Incomplete  Culture, blood (routine x 2)     Status: None (Preliminary result)   Collection Time: 08/23/20  4:13 PM   Specimen:  BLOOD LEFT HAND  Result Value Ref Range Status   Specimen Description BLOOD LEFT HAND  Final   Special Requests   Final    BOTTLES DRAWN AEROBIC AND ANAEROBIC Blood Culture adequate volume   Culture   Final    NO GROWTH < 24 HOURS Performed at [facility], [address], [place], [place] [address]    Report Status PENDING  Incomplete  Culture, blood (routine x 2)     Status: None (Preliminary result)   Collection Time: 08/23/20  4:14 PM   Specimen: BLOOD  Result Value Ref Range Status   Specimen Description BLOOD LEFT ANTECUBITAL  Final   Special Requests   Final    BOTTLES DRAWN AEROBIC AND ANAEROBIC Blood Culture adequate volume   Culture   Final    NO GROWTH < 24 HOURS Performed at [facility], [place] [address], [place], [place] [address]    Report Status PENDING  Incomplete     Serology:  Micro: 3/30 bcx ngtd 3/29 wound cx staph aureus mrsa (S bactrim, tetra); I have asked labs 3/31 to send for daptomycin susceptibility  Imaging: If present, new imagings (plain films, ct scans, and mri) have been personally visualized and interpreted; radiology reports have been reviewed. Decision making incorporated into the Impression / Recommendations.  3/31 tte pending read  Jabier Mutton, [place] for Infectious [place] [phone] pager    08/25/2020, 9:24 AM

## 2020-08-26 DIAGNOSIS — O99353 Diseases of the nervous system complicating pregnancy, third trimester: Secondary | ICD-10-CM | POA: Diagnosis not present

## 2020-08-26 DIAGNOSIS — M545 Low back pain, unspecified: Secondary | ICD-10-CM

## 2020-08-26 DIAGNOSIS — D649 Anemia, unspecified: Secondary | ICD-10-CM

## 2020-08-26 DIAGNOSIS — G9520 Unspecified cord compression: Secondary | ICD-10-CM | POA: Diagnosis not present

## 2020-08-26 DIAGNOSIS — O26893 Other specified pregnancy related conditions, third trimester: Secondary | ICD-10-CM

## 2020-08-26 DIAGNOSIS — O24410 Gestational diabetes mellitus in pregnancy, diet controlled: Secondary | ICD-10-CM

## 2020-08-26 DIAGNOSIS — G062 Extradural and subdural abscess, unspecified: Secondary | ICD-10-CM | POA: Diagnosis not present

## 2020-08-26 DIAGNOSIS — O99013 Anemia complicating pregnancy, third trimester: Secondary | ICD-10-CM

## 2020-08-26 LAB — CBC
HCT: 24.4 % — ABNORMAL LOW (ref 36.0–46.0)
Hemoglobin: 8 g/dL — ABNORMAL LOW (ref 12.0–15.0)
MCH: 29.7 pg (ref 26.0–34.0)
MCHC: 32.8 g/dL (ref 30.0–36.0)
MCV: 90.7 fL (ref 80.0–100.0)
Platelets: 239 10*3/uL (ref 150–400)
RBC: 2.69 MIL/uL — ABNORMAL LOW (ref 3.87–5.11)
RDW: 15.9 % — ABNORMAL HIGH (ref 11.5–15.5)
WBC: 6.5 10*3/uL (ref 4.0–10.5)
nRBC: 0.5 % — ABNORMAL HIGH (ref 0.0–0.2)

## 2020-08-26 LAB — CREATININE, SERUM
Creatinine, Ser: 0.52 mg/dL (ref 0.44–1.00)
GFR, Estimated: >60 mL/min (ref >60)

## 2020-08-26 LAB — CK: Total CK: 20 U/L — ABNORMAL LOW (ref 38–234)

## 2020-08-26 MED ORDER — LORATADINE 10 MG PO TABS
10.0000 mg | ORAL_TABLET | Freq: Every day | ORAL | Status: DC | PRN
  Administered 2020-08-27: 10 mg via ORAL
  Filled 2020-08-26 (×2): qty 1

## 2020-08-26 MED ORDER — CHLORHEXIDINE GLUCONATE CLOTH 2 % EX PADS
6.0000 | MEDICATED_PAD | Freq: Every day | CUTANEOUS | Status: DC
  Administered 2020-08-26 – 2020-09-02 (×7): 6 via TOPICAL

## 2020-08-26 MED ORDER — SODIUM CHLORIDE 0.9% FLUSH: 10.0000 mL | INTRAVENOUS | Status: DC | PRN

## 2020-08-26 MED ORDER — HYDROMORPHONE HCL 2 MG PO TABS
2.0000 mg | ORAL_TABLET | ORAL | Status: DC | PRN
  Administered 2020-08-26 (×2): 4 mg via ORAL
  Administered 2020-08-27 (×4): 2 mg via ORAL
  Administered 2020-08-27: 4 mg via ORAL
  Administered 2020-08-28 – 2020-08-29 (×5): 2 mg via ORAL
  Administered 2020-08-29 (×2): 4 mg via ORAL
  Filled 2020-08-26: qty 2
  Filled 2020-08-26 (×4): qty 1
  Filled 2020-08-26: qty 2
  Filled 2020-08-26: qty 1
  Filled 2020-08-26 (×3): qty 2
  Filled 2020-08-26 (×3): qty 1
  Filled 2020-08-26: qty 2

## 2020-08-26 MED ORDER — SODIUM CHLORIDE 0.9% FLUSH
10.0000 mL | Freq: Two times a day (BID) | INTRAVENOUS | Status: DC
  Administered 2020-08-26 – 2020-09-02 (×10): 10 mL

## 2020-08-26 NOTE — Progress Notes (Signed)
Peripherally Inserted Central Catheter Placement  The IV Nurse has discussed with the patient and/or persons authorized to consent for the patient, the purpose of this procedure and the potential benefits and risks involved with this procedure.  The benefits include less needle sticks, lab draws from the catheter, and the patient may be discharged home with the catheter. Risks include, but not limited to, infection, bleeding, blood clot (thrombus formation), and puncture of an artery; nerve damage and irregular heartbeat and possibility to perform a PICC exchange if needed/ordered by physician.  Alternatives to this procedure were also discussed.  Bard Power PICC patient education guide, fact sheet on infection prevention and patient information card has been provided to patient /or left at bedside.    PICC Placement Documentation  PICC Single Lumen 25/48/62 PICC Right Basilic 35 cm 0 cm (Active)  Indication for Insertion or Continuance of Line Home intravenous therapies (PICC only) 08/26/20 1407  Exposed Catheter (cm) 0 cm 08/26/20 1407  Site Assessment Clean;Dry;Intact 08/26/20 1407  Line Status Flushed;Saline locked;Blood return noted 08/26/20 1407  Dressing Type Transparent;Securing device 08/26/20 1407  Dressing Status Clean;Dry;Intact 08/26/20 1407  Antimicrobial disc in place? Yes 08/26/20 1407  Safety Lock Not Applicable 82/41/75 3010  Dressing Change Due 09/02/20 08/26/20 1407       Colleen Valentine 08/26/2020, 2:10 PM

## 2020-08-26 NOTE — Progress Notes (Signed)
Daily Antepartum Note  Admission Date: 08/21/2020 Current Date: 08/26/2020 8:50 AM  Colleen Valentine is a 25 y.o. [id number] @ [redacted]w[redacted]d, HD#6/POD#3 s/p laminotomies for epidural abscess drainage by neurosurgery , admitted for low back pain and neuro s/s.  Pregnancy complicated by: Patient Active Problem List   Diagnosis Date Noted  . Epidural abscess   . Vertebral osteomyelitis ([place])   . MRSA bacteremia   . [redacted] weeks gestation of pregnancy 08/22/2020  . 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 Spinal cord compression ([place]) 08/21/2020  . Splenomegaly 08/16/2020  . Low back pain 08/16/2020  . Vaginal bleeding in pregnancy, third trimester 07/29/2020  . Marijuana abuse 07/29/2020  . Abscess of left axilla 07/29/2020  . Anemia in pregnancy 07/29/2020  . Gestational diabetes mellitus in third trimester 07/29/2020  . Chlamydia infection 01/25/2020  . Abdominal pain 02/27/2017  . Lower abdominal pain 02/27/2017  . Esophageal dysphagia 02/27/2017  . Current smoker 08/12/2016  . Genital herpes 04/08/2016  . GERD (gastroesophageal reflux disease) 10/17/2015  . Morbid obesity with BMI of 40.0-44.9, adult ([place]) 10/17/2015  . ADHD (attention deficit hyperactivity disorder), combined type 07/21/2012  . MDD (major depressive disorder), recurrent episode, moderate ([place]) 04/01/2011  . Oppositional defiant disorder 04/01/2011    Overnight/24hr events:  None  Subjective:  Stable low back pain. No OB s/s.   Objective:    Current Vital Signs 24h Vital Sign Ranges  T 97.8 F (36.6 C) Temp  Avg: 97.9 F (36.6 C)  Min: 97.6 F (36.4 C)  Max: 98.2 F (36.8 C)  BP 98/68 BP  Min: 98/68  Max: 117/66  HR 93 Pulse  Avg: 100.4  Min: 87  Max: 110  RR 18 Resp  Avg: 18  Min: 18  Max: 18  SaO2 100 %  (Room air) SpO2  Avg: 98.8 %  Min: 98 %  Max: 100 %       24 Hour I/O Current Shift I/O  Time Ins Outs 04/01 0701 - 04/02 0700 In: 0  Out: 1010 [Urine:1000; Drains:10] 04/02 0701 - 04/02 1900 In: -  Out: 450  [Urine:450]   Patient Vitals for the past 24 hrs:  BP Temp Temp src Pulse Resp SpO2  08/26/20 0745 98/68 97.8 F (36.6 C) Oral 93 18 100 %  08/26/20 0416 (!) 115/55 -- -- 87 -- --  08/26/20 0415 -- -- -- -- -- 99 %  08/25/20 1951 114/65 98.1 F (36.7 C) Oral (!) 106 18 100 %  08/25/20 1621 (!) 107/51 98.2 F (36.8 C) Oral (!) 110 18 98 %  08/25/20 1620 -- -- -- -- -- 98 %  08/25/20 1132 117/66 97.6 F (36.4 C) Oral (!) 106 18 98 %   FHT: 140 baseline, +accels, no decel, mod variability Toco: rare contractions  Physical exam: General: Well nourished, well developed female in no acute distress. Abdomen: gravid, obese, nttp Respiratory: No resp distress Extremities: no clubbing, cyanosis or edema Skin: Warm and dry.   Medications: Current Facility-Administered Medications  Medication Dose Route Frequency Provider Last Rate Last Admin  . 0.9 %  sodium chloride infusion  250 mL Intravenous Continuous Kristeen Miss, MD      . acetaminophen (TYLENOL) tablet 650 mg  650 mg Oral Q4H PRN Kristeen Miss, MD   650 mg at 08/25/20 1831   Or  . acetaminophen (TYLENOL) suppository 650 mg  650 mg Rectal Q4H PRN Kristeen Miss, MD      . bisacodyl (DULCOLAX) EC tablet 10  mg  10 mg Oral BID PRN Kristeen Miss, MD   10 mg at 08/24/20 2049  . bisacodyl (DULCOLAX) suppository 10 mg  10 mg Rectal Daily PRN Kristeen Miss, MD      . calcium carbonate (TUMS - dosed in mg elemental calcium) chewable tablet 400 mg of elemental calcium  2 tablet Oral Q4H PRN Kristeen Miss, MD   400 mg of elemental calcium at 08/22/20 0124  . cyclobenzaprine (FLEXERIL) tablet 10 mg  10 mg Oral TID PRN Kristeen Miss, MD   10 mg at 08/25/20 1431  . DAPTOmycin (CUBICIN) 900 mg in sodium chloride 0.9 % IVPB  900 mg Intravenous Q2000 Vu, Trung T, MD 236 mL/hr at 08/25/20 1613 900 mg at 08/25/20 1613  . docusate sodium (COLACE) capsule 100 mg  100 mg Oral BID Kristeen Miss, MD   100 mg at 08/25/20 2202  . enoxaparin (LOVENOX)  injection 60 mg  60 mg Subcutaneous Q24H Janyth Pupa, DO   60 mg at 08/25/20 1826  . HYDROmorphone (DILAUDID) injection 1 mg  1 mg Intravenous Q3H PRN Kristeen Miss, MD   1 mg at 08/26/20 0756  . lamoTRIgine (LAMICTAL) tablet 25 mg  25 mg Oral BID Kristeen Miss, MD   25 mg at 08/25/20 2202  . LORazepam (ATIVAN) tablet 0.5 mg  0.5 mg Oral BID PRN Kristeen Miss, MD   0.5 mg at 08/25/20 0909  . menthol-cetylpyridinium (CEPACOL) lozenge 3 mg  1 lozenge Oral PRN Kristeen Miss, MD       Or  . phenol (CHLORASEPTIC) mouth spray 1 spray  1 spray Mouth/Throat PRN Kristeen Miss, MD      . mupirocin ointment (BACTROBAN) 2 % 1 application  1 application Nasal BID Kristeen Miss, MD   1 application at 03/20/84 2203  . ondansetron (ZOFRAN) tablet 4 mg  4 mg Oral Q6H PRN Kristeen Miss, MD       Or  . ondansetron (ZOFRAN) injection 4 mg  4 mg Intravenous Q6H PRN Kristeen Miss, MD      . pantoprazole (PROTONIX) EC tablet 40 mg  40 mg Oral Daily Kristeen Miss, MD   40 mg at 08/25/20 0909  . polyethylene glycol (MIRALAX / GLYCOLAX) packet 17 g  17 g Oral Daily PRN Kristeen Miss, MD      . prenatal multivitamin tablet 1 tablet  1 tablet Oral Q1200 Kristeen Miss, MD   1 tablet at 08/25/20 1103  . senna (SENOKOT) tablet 8.6 mg  1 tablet Oral BID Kristeen Miss, MD   8.6 mg at 08/25/20 2202  . sodium chloride flush (NS) 0.9 % injection 3 mL  3 mL Intravenous Austin Miles, MD   3 mL at 08/25/20 2203  . sodium chloride flush (NS) 0.9 % injection 3 mL  3 mL Intravenous PRN Kristeen Miss, MD   3 mL at 08/25/20 1616  . sodium phosphate (FLEET) 7-19 GM/118ML enema 1 enema  1 enema Rectal Once PRN Kristeen Miss, MD      . tetracaine 1 % injection 20 mL  200 mg Infiltration Once Kristeen Miss, MD      . tetracaine 1 % injection   Infiltration PRN de Sindy Messing, Erven Colla, MD   70 mg at 08/22/20 1730  . valACYclovir (VALTREX) tablet 500 mg  500 mg Oral BID Kristeen Miss, MD   500 mg at 08/25/20 2202  . zolpidem  (AMBIEN) tablet 5 mg  5 mg Oral QHS PRN Kristeen Miss, MD   5  mg at 08/21/20 2204    Labs:  Recent Labs  Lab 08/24/20 0424 08/25/20 0757 08/26/20 0554  WBC 7.9 7.2 6.5  HGB 8.1* 8.5* 8.0*  HCT 24.9* 26.1* 24.4*  PLT 234 258 239    Recent Labs  Lab 08/21/20 1735 08/24/20 0424 08/26/20 0554  NA 134*  --   --   K 3.6  --   --   CL 100  --   --   CO2 25  --   --   BUN 7  --   --   CREATININE 0.60 0.55 0.52  CALCIUM 9.1  --   --   PROT 6.6  --   --   BILITOT 0.8  --   --   ALKPHOS 128*  --   --   ALT 9  --   --   AST 10*  --   --   GLUCOSE 102*  --   --    3/30 BCX x 2: NGTD 3/30 abscess cx: MRSA resistant to cipro and oxacillin  Radiology:  No new imaging  Assessment & Plan:  Patient doing well *Pregnancy: reactive NST. Fetal status reassuring. Pregnancy status reassuring. Will need to make sure anesthesia sees her before d/c to talk to her about unable to do regional anesthesia during delivery *ID: NSU to follow up on Monday and ID has signed off. Pt for PICC re-attempt today if pt can lie still for placement. Will hold off on IV iron d/w pt until PICC is placed.  -PT following *Anemia: see above; asymptomatic *GDM: diet changed to GDM and CBG checks added.  *Preterm: no issues. S/p bmz 3/5 and 3/6 *PPx: lovenox *FEN/GI: GDM diet, SLIV *Dispo: follow up home health, ID and NSU recs on Monday.   Durene Romans MD Attending [facility] GYN Consult Phone: [phone] (M-F, 0800-1700) & [phone] (Off hours, weekends, holidays)

## 2020-08-26 NOTE — Progress Notes (Signed)
RN present with PT. Patient reporting feeling "upside down" when standing with walker because of pain medication." BP obtained, WDL. Patient reported, "not feeling comfortable with trying to walk up stairs." Pt refused continuing with physical therapy. PT returned patient to chair. PT left the room and patient reported to RN, "I am not really swimmy headed, I feel fine, I did not want her [PT] in here anymore." Aunt present at bedside and reported to patient, "You need to make sure you are telling the truth when they are in here." Patient stated, "I know, but I am anxious because of the PICC line, I will do PT tomorrow." RN reported this encounter to MD.

## 2020-08-26 NOTE — Progress Notes (Signed)
PT Cancellation Note  Patient Details Name: Colleen Valentine MRN: [id number] DOB: August 20, 1995   Cancelled Treatment:    Reason Eval/Treat Not Completed: Patient declined, no reason specified (pt stating she is "upside down from dilaudid". Pt agreeable to stand and BP stable in standing. Pt then denied ambulation stating she is anxious about PICC line placement and not up for P.T. at this time)   [employer] 08/26/2020, 1:16 PM  [place] Pager: [phone] Office: [phone]

## 2020-08-27 DIAGNOSIS — Z3A35 35 weeks gestation of pregnancy: Secondary | ICD-10-CM

## 2020-08-27 LAB — AEROBIC/ANAEROBIC CULTURE W GRAM STAIN (SURGICAL/DEEP WOUND): Gram Stain: NONE SEEN

## 2020-08-27 LAB — GLUCOSE, CAPILLARY
Glucose-Capillary: 104 mg/dL — ABNORMAL HIGH (ref 70–99)
Glucose-Capillary: 125 mg/dL — ABNORMAL HIGH (ref 70–99)
Glucose-Capillary: 94 mg/dL (ref 70–99)

## 2020-08-27 MED ORDER — SODIUM CHLORIDE 0.9 % IV SOLN
INTRAVENOUS | Status: DC | PRN
  Administered 2020-08-27: 250 mL via INTRAVENOUS

## 2020-08-27 MED ORDER — DIPHENHYDRAMINE HCL 50 MG/ML IJ SOLN: 25.0000 mg | Freq: Once | INTRAMUSCULAR | Status: DC | PRN

## 2020-08-27 MED ORDER — FLEET ENEMA 7-19 GM/118ML RE ENEM: 1.0000 | ENEMA | Freq: Once | RECTAL | Status: DC

## 2020-08-27 MED ORDER — EPINEPHRINE PF 1 MG/ML IJ SOLN
0.3000 mg | Freq: Once | INTRAMUSCULAR | Status: DC | PRN
  Filled 2020-08-27: qty 1

## 2020-08-27 MED ORDER — SODIUM CHLORIDE 0.9 % IV BOLUS: 500.0000 mL | Freq: Once | INTRAVENOUS | Status: DC | PRN

## 2020-08-27 MED ORDER — SODIUM CHLORIDE 0.9 % IV SOLN
500.0000 mg | Freq: Once | INTRAVENOUS | Status: AC
  Administered 2020-08-27: 500 mg via INTRAVENOUS
  Filled 2020-08-27: qty 25

## 2020-08-27 MED ORDER — ALBUTEROL SULFATE (2.5 MG/3ML) 0.083% IN NEBU: 2.5000 mg | INHALATION_SOLUTION | Freq: Once | RESPIRATORY_TRACT | Status: DC | PRN

## 2020-08-27 MED ORDER — METHYLPREDNISOLONE SODIUM SUCC 125 MG IJ SOLR: 125.0000 mg | Freq: Once | INTRAMUSCULAR | Status: DC | PRN

## 2020-08-27 NOTE — Evaluation (Signed)
Occupational Therapy Evaluation Patient Details Name: Colleen Valentine MRN: [id number] DOB: 24-Apr-1996 Today's Date: 08/27/2020    History of Present Illness 25 y/o female [redacted] weeks gestation who presents with T10-L3 epidural abscess on 08/22/2020. She is now s/p T11-L1 laminotomies with decompression of epidural abscess.   PMH significant for suicide attempt, seizures, polysubstance abuse, herpes, acute pyelonephritis.   Clinical Impression   PTA, pt was living with her girlfriend and was independent; has a very active one year old son at home and girlfriend works second shift (4-12). Currently, pt requires Min-Max A for LB ADLs and Min Guard A for functional mobility using RW. Provided education and handout on back precautions, LB ADLs with AE, toileting, and shower transfer with shower seat; pt demonstrated understanding. Recommend continued acute OT to facilitate safe dc. Recommend dc home once medically stable per physician.    Follow Up Recommendations  No OT follow up    Equipment Recommendations  Other (comment) (3N1 and shower seat in room)    Recommendations for Other Services PT consult     Precautions / Restrictions Precautions Precautions: Fall;Back Precaution Booklet Issued: Yes (comment) Precaution Comments: Reviewed spinal precautions with pt. Required Braces or Orthoses:  (No brace needed per order) Restrictions Weight Bearing Restrictions: No      Mobility Bed Mobility               General bed mobility comments: Discussed use of log roll technique    Transfers Overall transfer level: Needs assistance Equipment used: Rolling walker (2 wheeled) Transfers: Sit to/from Stand Sit to Stand: Supervision         General transfer comment: Supervision for safety    Balance Overall balance assessment: Needs assistance Sitting-balance support: No upper extremity supported;Feet supported Sitting balance-Leahy Scale: Fair     Standing balance support: No  upper extremity supported;During functional activity Standing balance-Leahy Scale: Fair                             ADL either performed or assessed with clinical judgement   ADL Overall ADL's : Needs assistance/impaired Eating/Feeding: Independent   Grooming: Supervision/safety;Standing Grooming Details (indicate cue type and reason): Educating pt on compensatory tehcniques for oral care Upper Body Bathing: Set up;Supervision/ safety;Sitting   Lower Body Bathing: Minimal assistance;Sit to/from stand Lower Body Bathing Details (indicate cue type and reason): Pt with questions about bathing LB and no bending. Initiated education for use of AE Upper Body Dressing : Set up;Supervision/safety;Sitting   Lower Body Dressing: Maximal assistance;Sit to/from stand Lower Body Dressing Details (indicate cue type and reason): Pt was unable to perform fgure four and will need AE. Toilet Transfer: Supervision/safety;RW;Ambulation (simulated in room)         Clinical cytogeneticist Details (indicate cue type and reason): Educated pt on comepnsatory tehcnique for backing up to shower Functional mobility during ADLs: Supervision/safety;Rolling walker General ADL Comments: Initiating education on AE for LB ADLs     Vision Baseline Vision/History: Wears glasses Patient Visual Report: No change from baseline       Perception     Praxis      Pertinent Vitals/Pain Pain Assessment: 0-10 Pain Score: 7  Pain Location: Back Pain Descriptors / Indicators: Operative site guarding;Dull;Stabbing;Throbbing Pain Intervention(s): Monitored during session;Repositioned;RN gave pain meds during session     Hand Dominance Right   Extremity/Trunk Assessment Upper Extremity Assessment Upper Extremity Assessment: Overall WFL for tasks assessed   Lower Extremity Assessment  Lower Extremity Assessment: Defer to PT evaluation   Cervical / Trunk Assessment Cervical / Trunk Assessment: Other  exceptions Cervical / Trunk Exceptions: s/p lami surgery   Communication Communication Communication: No difficulties   Cognition Arousal/Alertness: Awake/alert Behavior During Therapy: WFL for tasks assessed/performed Overall Cognitive Status: Within Functional Limits for tasks assessed                                     General Comments       Exercises     Shoulder Instructions      Home Living Family/patient expects to be discharged to:: Private residence Living Arrangements: Spouse/significant other;Children (57 yo; "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") Available Help at Discharge: Family;Available PRN/intermittently Type of Home: Mobile home Home Access: Stairs to enter Entrance Stairs-Number of Steps: 7 Entrance Stairs-Rails: Right;Left Home Layout: One level     Bathroom Shower/Tub: Tub/shower unit;Walk-in shower   Bathroom Toilet: Standard     Home Equipment: Environmental consultant - 2 wheels;Toilet riser;Hand held shower head   Additional Comments: 3N1 and shower seat already delivered to room      Prior Functioning/Environment Level of Independence: Independent                 OT Problem List: Decreased range of motion;Impaired balance (sitting and/or standing);Decreased knowledge of use of DME or AE;Decreased knowledge of precautions;Pain      OT Treatment/Interventions: Self-care/ADL training;Therapeutic exercise;DME and/or AE instruction;Energy conservation;Therapeutic activities;Patient/family education    OT Goals(Current goals can be found in the care plan section) Acute Rehab OT Goals Patient Stated Goal: Decrease pain, be safe at home OT Goal Formulation: With patient Time For Goal Achievement: 09/10/20 Potential to Achieve Goals: Good  OT Frequency: Min 2X/week   Barriers to D/C:            Co-evaluation              AM-PAC OT "6 Clicks" Daily Activity     Outcome Measure Help from another person eating meals?: None Help from another person  taking care of personal grooming?: A Little Help from another person toileting, which includes using toliet, bedpan, or urinal?: A Little Help from another person bathing (including washing, rinsing, drying)?: A Little Help from another person to put on and taking off regular upper body clothing?: A Little Help from another person to put on and taking off regular lower body clothing?: A Lot 6 Click Score: 18   End of Session Equipment Utilized During Treatment: Rolling walker Nurse Communication: Mobility status  Activity Tolerance: Patient tolerated treatment well Patient left: in bed;with call bell/phone within reach (EOB)  OT Visit Diagnosis: Unsteadiness on feet (R26.81);Other abnormalities of gait and mobility (R26.89);Muscle weakness (generalized) (M62.81);Pain Pain - part of body:  (Back)                Time: 3151-7616 OT Time Calculation (min): 31 min Charges:  OT General Charges $OT Visit: 1 Visit OT Evaluation $OT Eval Moderate Complexity: 1 Mod OT Treatments $Self Care/Home Management : 8-22 mins  Asenath Balash MSOT, OTR/L Acute Rehab Pager: [phone] Office: [place] 08/27/2020, 5:27 PM

## 2020-08-27 NOTE — Progress Notes (Signed)
Patient ID: Colleen Valentine, female   DOB: 1995/12/31, 25 y.o.   MRN: [id number] [place] COMPREHENSIVE PROGRESS NOTE  Colleen Valentine is a 25 y.o. [id number] at [redacted]w[redacted]d HD #23/POD # 4 s/p laminotomies for epidural abscess drainage by neurosurgery who is admitted for back pain and abscess..   Fetal presentation is cephalic. Length of Stay:  6  Days  Subjective: Pt reports feeling legs continues to improve. Pain controlled. Tolerating diet Patient reports good fetal movement.  She reports no uterine contractions, no bleeding and no loss of fluid per vagina.  Vitals:  Blood pressure 121/67, pulse 95, temperature 97.7 F (36.5 C), temperature source Oral, resp. rate 18, height 5\' 5"  (1.651 m), weight 112.8 kg, last menstrual period 12/14/2019, SpO2 100 %, unknown if currently breastfeeding.   Physical Examination: Lungs clear Heart RRR Abd soft + BS gravid  Fetal Monitoring:  130-140's, + acels, no decels, no ut ctx  Labs:  No results found for this or any previous visit (from the past 24 hour(s)).  Imaging Studies:    BPP 8/8 on 08/25/20 Vertex   Medications:  Scheduled . Chlorhexidine Gluconate Cloth  6 each Topical Daily  . docusate sodium  100 mg Oral BID  . enoxaparin (LOVENOX) injection  60 mg Subcutaneous Q24H  . lamoTRIgine  25 mg Oral BID  . pantoprazole  40 mg Oral Daily  . prenatal multivitamin  1 tablet Oral Q1200  . senna  1 tablet Oral BID  . sodium chloride flush  10-40 mL Intracatheter Q12H  . sodium chloride flush  3 mL Intravenous Q12H  . tetracaine  200 mg Infiltration Once  . valACYclovir  500 mg Oral BID   I have reviewed the patient's current medications.  ASSESSMENT: IUP 35 0/7 weeks Post op as noted above Anemia  PLAN: Stable from an OB standpoint. Iron infusion for anemia.  Continue routine antenatal care.   Colleen Valentine 08/27/2020,10:48 AM

## 2020-08-28 DIAGNOSIS — Z3A35 35 weeks gestation of pregnancy: Secondary | ICD-10-CM

## 2020-08-28 LAB — CBC WITH DIFFERENTIAL/PLATELET
Abs Immature Granulocytes: 0.08 10*3/uL — ABNORMAL HIGH (ref 0.00–0.07)
Basophils Absolute: 0 10*3/uL (ref 0.0–0.1)
Basophils Relative: 0 %
Eosinophils Absolute: 0.1 10*3/uL (ref 0.0–0.5)
Eosinophils Relative: 1 %
HCT: 26.7 % — ABNORMAL LOW (ref 36.0–46.0)
Hemoglobin: 8.7 g/dL — ABNORMAL LOW (ref 12.0–15.0)
Immature Granulocytes: 1 %
Lymphocytes Relative: 23 %
Lymphs Abs: 1.4 10*3/uL (ref 0.7–4.0)
MCH: 29.3 pg (ref 26.0–34.0)
MCHC: 32.6 g/dL (ref 30.0–36.0)
MCV: 89.9 fL (ref 80.0–100.0)
Monocytes Absolute: 0.2 10*3/uL (ref 0.1–1.0)
Monocytes Relative: 4 %
Neutro Abs: 4.3 10*3/uL (ref 1.7–7.7)
Neutrophils Relative %: 71 %
Platelets: 270 10*3/uL (ref 150–400)
RBC: 2.97 MIL/uL — ABNORMAL LOW (ref 3.87–5.11)
RDW: 15.9 % — ABNORMAL HIGH (ref 11.5–15.5)
WBC: 6.1 10*3/uL (ref 4.0–10.5)
nRBC: 0 % (ref 0.0–0.2)

## 2020-08-28 LAB — GLUCOSE, CAPILLARY
Glucose-Capillary: 72 mg/dL (ref 70–99)
Glucose-Capillary: 74 mg/dL (ref 70–99)
Glucose-Capillary: 99 mg/dL (ref 70–99)
Glucose-Capillary: 74 mg/dL (ref 70–99)
Glucose-Capillary: 81 mg/dL (ref 70–99)

## 2020-08-28 NOTE — Progress Notes (Signed)
Occupational Therapy Treatment Patient Details Name: Colleen Valentine MRN: [id number] DOB: [birth year] Today's Date: 08/28/2020    History of present illness 25 y/o female [redacted] weeks gestation who presents with T10-L3 epidural abscess on 08/22/2020. She is now s/p T11-L1 laminotomies with decompression of epidural abscess.   PMH significant for suicide attempt, seizures, polysubstance abuse, herpes, acute pyelonephritis.   OT comments  Pt progressing towards established OT goals. Providing education on AE for LB ADLs and toileting. Pt verbalized and demonstrated understanding. Provided AE and toilet aide. Pt appreciative. Continue to recommend dc to home once medically stable per physician. Provided answers to questions and all education. All acute OT needs met and will sign off.    Follow Up Recommendations  No OT follow up    Equipment Recommendations   (3N1 and shower seat in room)    Recommendations for Other Services PT consult    Precautions / Restrictions Precautions Precautions: Fall;Back Precaution Booklet Issued: Yes (comment) Precaution Comments: Reviewed spinal precautions with pt. Required Braces or Orthoses:  (No brace needed per order) Restrictions Weight Bearing Restrictions: No       Mobility Bed Mobility               General bed mobility comments: Sitting EOB    Transfers Overall transfer level: Needs assistance Equipment used: Rolling walker (2 wheeled) Transfers: Sit to/from Stand Sit to Stand: Supervision         General transfer comment: Supervision for safety    Balance Overall balance assessment: Needs assistance Sitting-balance support: No upper extremity supported;Feet supported Sitting balance-Leahy Scale: Fair     Standing balance support: No upper extremity supported;During functional activity Standing balance-Leahy Scale: Fair Standing balance comment: Able to maintain static standing without UE support                            ADL either performed or assessed with clinical judgement   ADL Overall ADL's : Needs assistance/impaired             Lower Body Bathing: Supervison/ safety;Sit to/from stand;With adaptive equipment Lower Body Bathing Details (indicate cue type and reason): Educating on AE for LB bathing     Lower Body Dressing: Supervision/safety;Sit to/from stand;With adaptive equipment Lower Body Dressing Details (indicate cue type and reason): Educating pt on use of AE for LB dressing. Pt demonstrating understanding and donning socks and underwear.     Toileting- Clothing Manipulation and Hygiene: Supervision/safety;Sit to/from stand;With adaptive equipment Toileting - Clothing Manipulation Details (indicate cue type and reason): Educating on AE for toilet hygiene   Tub/Shower Transfer Details (indicate cue type and reason): Reviewed shower transfer with GF present Functional mobility during ADLs: Supervision/safety;Rolling walker General ADL Comments: Providing education on AE for LB ADLs and toileting.     Vision       Perception     Praxis      Cognition Arousal/Alertness: Awake/alert Behavior During Therapy: WFL for tasks assessed/performed Overall Cognitive Status: Within Functional Limits for tasks assessed                                          Exercises     Shoulder Instructions       General Comments Pt's girlfriend present during session    Pertinent Vitals/ Pain       Pain Assessment: Faces  Faces Pain Scale: Hurts even more Pain Location: Back Pain Descriptors / Indicators: Operative site guarding;Dull;Stabbing;Throbbing Pain Intervention(s): Monitored during session;Limited activity within patient's tolerance;Repositioned  Home Living                                          Prior Functioning/Environment              Frequency  Min 2X/week        Progress Toward Goals  OT Goals(current goals can now be  found in the care plan section)  Progress towards OT goals: Progressing toward goals  Acute Rehab OT Goals Patient Stated Goal: Decrease pain, be safe at home OT Goal Formulation: All assessment and education complete, DC therapy ADL Goals Pt Will Perform Lower Body Dressing: with modified independence;sit to/from stand;with adaptive equipment Pt Will Perform Toileting - Clothing Manipulation and hygiene: with modified independence;with adaptive equipment;sit to/from stand Additional ADL Goal #1: Pt will perform bed mobility with Supervision in preparation for ADLs  Plan All goals met and education completed, patient discharged from OT services    Co-evaluation                 AM-PAC OT "6 Clicks" Daily Activity     Outcome Measure   Help from another person eating meals?: None Help from another person taking care of personal grooming?: A Little Help from another person toileting, which includes using toliet, bedpan, or urinal?: A Little Help from another person bathing (including washing, rinsing, drying)?: A Little Help from another person to put on and taking off regular upper body clothing?: A Little Help from another person to put on and taking off regular lower body clothing?: A Little 6 Click Score: 19    End of Session Equipment Utilized During Treatment: Rolling walker  OT Visit Diagnosis: Unsteadiness on feet (R26.81);Other abnormalities of gait and mobility (R26.89);Muscle weakness (generalized) (M62.81);Pain Pain - part of body:  (Back)   Activity Tolerance Patient tolerated treatment well   Patient Left in bed;with call bell/phone within reach;with family/visitor present   Nurse Communication Mobility status        Time: 1435-1445 OT Time Calculation (min): 10 min  Charges: OT General Charges $OT Visit: 1 Visit OT Treatments $Self Care/Home Management : 8-22 mins  [place], OTR/L Acute Rehab Pager: [phone] Office:  [place] 08/28/2020, 4:20 PM

## 2020-08-28 NOTE — Progress Notes (Signed)
Physical Therapy Treatment Patient Details Name: Colleen Valentine MRN: [id number] DOB: 1996/03/13 Today's Date: 08/28/2020    History of Present Illness 25 y/o female [redacted] weeks gestation who presents with T10-L3 epidural abscess on 08/22/2020. She is now s/p T11-L1 laminotomies with decompression of epidural abscess.   PMH significant for suicide attempt, seizures, polysubstance abuse, herpes, acute pyelonephritis.    PT Comments    Pt progressing towards goals, however, reporting increased back pain. Was able to perform gait and stair navigation this session. Pt shaky with stair navigation as she reports she is fearful of falling on steps; has had multiple falls prior to admission on steps. Overall requiring supervision to min guard throughout mobility tasks. Current recommendations appropriate. Will continue to follow acutely.     Follow Up Recommendations  No PT follow up;Supervision for mobility/OOB     Equipment Recommendations  Rolling walker with 5" wheels;Other (comment);3in1 (PT) (shower chair)    Recommendations for Other Services       Precautions / Restrictions Precautions Precautions: Fall;Back Precaution Booklet Issued: Yes (comment) Precaution Comments: Reviewed spinal precautions with pt. Required Braces or Orthoses:  (No brace needed per order) Restrictions Weight Bearing Restrictions: No    Mobility  Bed Mobility               General bed mobility comments: Sitting EOB    Transfers Overall transfer level: Needs assistance Equipment used: Rolling walker (2 wheeled) Transfers: Sit to/from Stand Sit to Stand: Supervision         General transfer comment: Supervision for safety  Ambulation/Gait Ambulation/Gait assistance: Supervision Gait Distance (Feet): 200 Feet Assistive device: Rolling walker (2 wheeled) Gait Pattern/deviations: Step-through pattern;Decreased stride length;Wide base of support Gait velocity: Decreased   General Gait Details: Pt  reports feeling more shaky, especially after practicing steps. Did report LLE felt better. Supervision for safety.   Stairs Stairs: Yes Stairs assistance: Min guard Stair Management: One rail Right;Sideways;Step to pattern Number of Stairs: 2 General stair comments: Pt shaky on steps. Reports she was nervous as she has had multiple falls on steps. Cues for sequencing. Min guard for safety.   Wheelchair Mobility    Modified Rankin (Stroke Patients Only)       Balance Overall balance assessment: Needs assistance Sitting-balance support: No upper extremity supported;Feet supported Sitting balance-Leahy Scale: Fair     Standing balance support: No upper extremity supported;During functional activity Standing balance-Leahy Scale: Fair Standing balance comment: Able to maintain static standing without UE support                            Cognition Arousal/Alertness: Awake/alert Behavior During Therapy: WFL for tasks assessed/performed Overall Cognitive Status: Within Functional Limits for tasks assessed                                        Exercises      General Comments General comments (skin integrity, edema, etc.): Pt's girlfriend present during session      Pertinent Vitals/Pain Pain Assessment: Faces Faces Pain Scale: Hurts even more Pain Location: Back Pain Descriptors / Indicators: Operative site guarding;Dull;Stabbing;Throbbing Pain Intervention(s): Limited activity within patient's tolerance;Monitored during session;Repositioned    Home Living                      Prior Function  PT Goals (current goals can now be found in the care plan section) Acute Rehab PT Goals Patient Stated Goal: Decrease pain, be safe at home PT Goal Formulation: With patient Time For Goal Achievement: 08/30/20 Potential to Achieve Goals: Good Progress towards PT goals: Progressing toward goals    Frequency    Min  5X/week      PT Plan Current plan remains appropriate    Co-evaluation              AM-PAC PT "6 Clicks" Mobility   Outcome Measure  Help needed turning from your back to your side while in a flat bed without using bedrails?: None Help needed moving from lying on your back to sitting on the side of a flat bed without using bedrails?: A Little Help needed moving to and from a bed to a chair (including a wheelchair)?: A Little Help needed standing up from a chair using your arms (e.g., wheelchair or bedside chair)?: A Little Help needed to walk in hospital room?: A Little Help needed climbing 3-5 steps with a railing? : A Little 6 Click Score: 19    End of Session Equipment Utilized During Treatment: Gait belt Activity Tolerance: Patient tolerated treatment well Patient left: in bed;with call bell/phone within reach (sitting EOB eating lunch) Nurse Communication: Mobility status PT Visit Diagnosis: Unsteadiness on feet (R26.81);Pain Pain - part of body:  (back)     Time: 0488-8916 PT Time Calculation (min) (ACUTE ONLY): 17 min  Charges:  $Gait Training: 8-22 mins                     [name], DPT  Acute Rehabilitation Services  Pager: [phone] Office: [phone]    Rudean Hitt 08/28/2020, 3:29 PM

## 2020-08-28 NOTE — Progress Notes (Signed)
Patient ID: Colleen Valentine, female   DOB: [birth year], 25 y.o.   MRN: [id number] Vital signs are stable Drainage from incision is bothersome Incision has some reddened borders I have advised every 8 hour dressing changes.  Paint incision with Betadine.  Dry gauze dressing should suffice.  We will monitor this for the next couple days.

## 2020-08-28 NOTE — Care Management (Addendum)
CM spoke to [facility] # [phone]  with [facility] and touched base regarding patient's dc needs.  Pam confirmed that  [facility] will be supplying IV anxt in the home and they have contracted RN- nursing for home through [facility] ph# [phone].  Pam with [employer] will initiate teaching prior to discharge.  Plan will be day of discharge for patient to receive IV antx in the hospital and then [facility] agency - will have start of care the day after discharge.  Antibiotic end date is scheduled for 10/18/20. CM will continue to follow.  [name] RNC-MNN, BSN Transitions of Care Pediatrics/[facility] [place] Ph# [phone]

## 2020-08-28 NOTE — Progress Notes (Signed)
Message left with Lanelle Bal at [facility] for Dr. Ellene Route regarding concern for surgical site infection.

## 2020-08-28 NOTE — Progress Notes (Signed)
[place]) NOTE  Colleen Valentine is a 25 y.o. [phone] with Estimated Date of Delivery: 10/01/20   By  best clinical estimate [redacted]w[redacted]d  Admitted for epidural abscess- s/p laminotomies/decompression of abscess completed on 3/30  Fetal presentation is cephalic. Length of Stay:  7  Days  Date of admission:08/21/2020  Subjective: Today she notes that she feels worse and "think it's infected."  She notes considerable back pain today as well as extreme fatigue and some generalized weakness.  Yesterday, she reports feeling like she had a really good day and today not as much.  Denies fever or chills.  Denies SOB/nausea/vomiting.  Ambulation continues to improve.  Patient reports the fetal movement as active. Patient reports uterine contraction  activity as none. Patient reports  vaginal bleeding as none- denies any further bleeding since Friday Patient describes fluid per vagina as None.  Vitals:  Blood pressure 122/67, pulse 97, temperature (!) 97.5 F (36.4 C), temperature source Oral, resp. rate 18, height 5\' 5"  (1.651 m), weight 105.4 kg, last menstrual period 12/14/2019, SpO2 98 %, unknown if currently breastfeeding. Vitals:   08/28/20 0550 08/28/20 0623 08/28/20 0839 08/28/20 1128  BP:  111/72 124/67 122/67  Pulse:  89 88 97  Resp:  18 18 18   Temp:  98.4 F (36.9 C) 97.6 F (36.4 C) (!) 97.5 F (36.4 C)  TempSrc:  Oral Oral Oral  SpO2:  99% 100% 98%  Weight: 105.4 kg     Height:       Physical Examination:  General appearance - alert, well appearing, and in no distress Chest - CTAB Heart - normal rate and regular rhythm Abdomen -obese, soft and non-tender, graivd Back- bandage- clean and dry- mild erythema noted around mid portion of incision, no exudate appreciated Extremities - no pedal edema noted Skin - normal coloration and turgor, no rashes, no diaphoresis   Fetal Monitoring:  Baseline: 150 bpm, Variability: moderate, Accelerations: +accels, and  Decelerations: Absent    reactive  Labs:  Results for orders placed or performed during the hospital encounter of 08/21/20 (from the past 24 hour(s))  Glucose, capillary   Collection Time: 08/27/20  3:26 PM  Result Value Ref Range   Glucose-Capillary 125 (H) 70 - 99 mg/dL  Glucose, capillary   Collection Time: 08/27/20  8:30 PM  Result Value Ref Range   Glucose-Capillary 94 70 - 99 mg/dL  Glucose, capillary   Collection Time: 08/28/20  6:20 AM  Result Value Ref Range   Glucose-Capillary 72 70 - 99 mg/dL  Glucose, capillary   Collection Time: 08/28/20 10:43 AM  Result Value Ref Range   Glucose-Capillary 74 70 - 99 mg/dL    Imaging Studies:    [place] MFM FETAL BPP WO NON STRESS  Result Date: 08/25/2020 ----------------------------------------------------------------------  OBSTETRICS REPORT                       (Signed Final 08/25/2020 01:50 pm) ---------------------------------------------------------------------- Patient Info  ID #:       [id number]                          D.O.B.:  [birth year] (24 yrs)  Name:       Colleen Valentine                    Visit Date: 08/25/2020 09:54 am ---------------------------------------------------------------------- Performed By  Attending:        Tama High MD  Ref. Address:     [place], [place]  Performed By:     Lavone Nian Phy.:   [facility] MAU/Triage                    RDMS  Referred By:      Shelle Iron              Location:         [facility]                    [place] ---------------------------------------------------------------------- Orders  #  Description                           Code        Ordered By  1  [place] MFM FETAL BPP WO NON               33545.62    Janyth Pupa      STRESS ----------------------------------------------------------------------  #  Order #                     Accession #                Episode #  1  [id number]                   [id number]                 [id number] ---------------------------------------------------------------------- Indications  [redacted] weeks gestation of pregnancy                Z3A.34  Non-reactive NST                               O28.9 ---------------------------------------------------------------------- Fetal Evaluation  Num Of Fetuses:         1  Fetal Heart Rate(bpm):  147  Cardiac Activity:       Observed  Presentation:           Cephalic  Placenta:  Anterior  P. Cord Insertion:      Previously Visualized  Amniotic Fluid  AFI FV:      Within normal limits  AFI Sum(cm)     %Tile       Largest Pocket(cm)  14.6            52          4.2  RUQ(cm)       RLQ(cm)       LUQ(cm)        LLQ(cm)  3.4           3             4               4.2 ---------------------------------------------------------------------- Biophysical Evaluation  Amniotic F.V:   Pocket => 2 cm             F. Tone:        Observed  F. Movement:    Observed                   Score:          8/8  F. Breathing:   Observed ---------------------------------------------------------------------- OB History  Gravidity:    5          SAB:   2  TOP:          1        Living:  1 ---------------------------------------------------------------------- Gestational Age  LMP:           34w 5d        Date:  12/26/19                 EDD:   10/01/20  Best:          34w 5d     Det. By:  LMP  (12/26/19)          EDD:   10/01/20 ---------------------------------------------------------------------- Impression  Patient was admitted with epidural abscess that was drained.  BPP was requested because of nonreactive NST.  Amniotic fluid is normal and good fetal activity is seen  .Antenatal testing is reassuring. BPP 8/8. ----------------------------------------------------------------------                   Tama High, MD Electronically Signed Final Report   08/25/2020 01:50 pm ----------------------------------------------------------------------  [place] EKG SITE RITE  Result Date: 08/25/2020 If Site Rite image not attached, placement could not be confirmed due to current cardiac rhythm.    Medications:  Scheduled . Chlorhexidine Gluconate Cloth  6 each Topical Daily  . docusate sodium  100 mg Oral BID  . enoxaparin (LOVENOX) injection  60 mg Subcutaneous Q24H  . lamoTRIgine  25 mg Oral BID  . pantoprazole  40 mg Oral Daily  . prenatal multivitamin  1 tablet Oral Q1200  . senna  1 tablet Oral BID  . sodium chloride flush  10-40 mL Intracatheter Q12H  . sodium chloride flush  3 mL Intravenous Q12H  . sodium phosphate  1 enema Rectal Once  . tetracaine  200 mg Infiltration Once  . valACYclovir  500 mg Oral BID   I have reviewed the patient's current medications.  ASSESSMENT/PLAN: [id number] [redacted]w[redacted]d with epidural abscess s/p laminotomies/decompression of abscess, POD#5     PLAN: 1)Epidural abscess s/p drainage- management per neurosurgery -JP removed, neurosurgery to come this afternoon to evaluate incision site -CBC ordered -PICC line placed, currently on IV Daptomycin -ID on board -continue PT  2)  Vaginal bleeding -no further bleeding since Friday, currently asymptomatic -pt previously admitted for similar concern early March- received BMZ at that time and was monitored with resolution of bleeding -no contractions noted on toco, plan to closely monitor bleeding- if persists or worsens may consider moving towards delivery.  -pt voided again- no bleeding noted.  For now plan to closely monitor in house for 5 days from bleeding  3) Fetal well-being -FWB- Cat. I, reactive NST -BMZ previously given at prior admission  3) Maternal well being -Anxiety/Depression- currently on Lamictal 25mg  bid -PNV daily -valtrex bid -general diet -pt being seen by PT  -DVT  prophylaxis-on Lovenox daily  DISP: Evaluation of incision site by Dr. Ellene Route today- for now continue with IV Daptomycin and await any further recommendations.  Janyth Pupa, DO Attending [place] for [employer], [place]

## 2020-08-29 LAB — CULTURE, BLOOD (ROUTINE X 2)
Culture: NO GROWTH
Culture: NO GROWTH
Special Requests: ADEQUATE
Special Requests: ADEQUATE

## 2020-08-29 LAB — MINIMUM INHIBITORY CONC. (1 DRUG)

## 2020-08-29 LAB — MIC RESULT

## 2020-08-29 LAB — GLUCOSE, CAPILLARY: Glucose-Capillary: 81 mg/dL (ref 70–99)

## 2020-08-29 NOTE — Plan of Care (Signed)
   Problem: Clinical Measurements: Goal: Ability to maintain clinical measurements within normal limits will improve Outcome: Progressing Goal: Will remain free from infection Outcome: Not Progressing   Problem: Activity: Goal: Risk for activity intolerance will decrease Outcome: Progressing   Problem: Coping: Goal: Level of anxiety will decrease Outcome: Progressing   Problem: Skin Integrity: Goal: Risk for impaired skin integrity will decrease Outcome: Not Progressing   Problem: Education: Goal: Knowledge of disease or condition will improve Outcome: Progressing Goal: Knowledge of the prescribed therapeutic regimen will improve Outcome: Progressing   Problem: Education: Goal: Knowledge of the prescribed therapeutic regimen will improve Outcome: Progressing   Problem: Skin Integrity: Goal: Will show signs of wound healing 08/29/2020 2252 by Melvyn Neth, RN Outcome: Not Progressing

## 2020-08-29 NOTE — Progress Notes (Signed)
Physical Therapy Treatment Patient Details Name: Colleen Valentine MRN: [id number] DOB: 1995-07-05 Today's Date: 08/29/2020    History of Present Illness 25 y/o female [redacted] weeks gestation who presents with T10-L3 epidural abscess on 08/22/2020. She is now s/p T11-L1 laminotomies with decompression of epidural abscess.   PMH significant for suicide attempt, seizures, polysubstance abuse, herpes, acute pyelonephritis.    PT Comments    Pt with good progression towards goals. Improved tolerance and confidence with stair navigation this session and did not note any shakiness. Pt did report increased back pain following stair navigation. Requiring min guard to supervision for mobility tasks. Current recommendations appropriate. Will continue to follow acutely.     Follow Up Recommendations  No PT follow up;Supervision for mobility/OOB     Equipment Recommendations  Rolling walker with 5" wheels;Other (comment);3in1 (PT) (shower chair)    Recommendations for Other Services       Precautions / Restrictions Precautions Precautions: Fall;Back Precaution Booklet Issued: Yes (comment) Precaution Comments: Reviewed spinal precautions with pt. Required Braces or Orthoses:  (No brace needed per order) Restrictions Weight Bearing Restrictions: No    Mobility  Bed Mobility               General bed mobility comments: Sitting EOB upon entry    Transfers Overall transfer level: Needs assistance Equipment used: Rolling walker (2 wheeled) Transfers: Sit to/from Stand Sit to Stand: Supervision         General transfer comment: Supervision for safety  Ambulation/Gait Ambulation/Gait assistance: Supervision Gait Distance (Feet): 250 Feet Assistive device: Rolling walker (2 wheeled) Gait Pattern/deviations: Step-through pattern;Decreased stride length;Wide base of support Gait velocity: Decreased   General Gait Details: Improved posture noted. Supervision for safety. No LOB  noted.   Stairs Stairs: Yes Stairs assistance: Min guard Stair Management: One rail Right;Sideways;Step to pattern Number of Stairs: 6 General stair comments: Pt with improved confidence with stair management. No shakiness noted. Min guard for safety.   Wheelchair Mobility    Modified Rankin (Stroke Patients Only)       Balance Overall balance assessment: Needs assistance Sitting-balance support: No upper extremity supported;Feet supported Sitting balance-Leahy Scale: Fair     Standing balance support: No upper extremity supported;During functional activity Standing balance-Leahy Scale: Fair Standing balance comment: Can maintain static standing without UE support                            Cognition Arousal/Alertness: Awake/alert Behavior During Therapy: WFL for tasks assessed/performed Overall Cognitive Status: Within Functional Limits for tasks assessed                                        Exercises      General Comments        Pertinent Vitals/Pain Pain Assessment: Faces Faces Pain Scale: Hurts even more Pain Location: Back Pain Descriptors / Indicators: Operative site guarding;Dull;Stabbing;Throbbing Pain Intervention(s): Limited activity within patient's tolerance;Monitored during session;Repositioned    Home Living                      Prior Function            PT Goals (current goals can now be found in the care plan section) Acute Rehab PT Goals Patient Stated Goal: Decrease pain, be safe at home PT Goal Formulation: With patient Time For  Goal Achievement: 09/06/20 Potential to Achieve Goals: Good Progress towards PT goals: Progressing toward goals    Frequency    Min 5X/week      PT Plan Current plan remains appropriate    Co-evaluation              AM-PAC PT "6 Clicks" Mobility   Outcome Measure  Help needed turning from your back to your side while in a flat bed without using  bedrails?: None Help needed moving from lying on your back to sitting on the side of a flat bed without using bedrails?: A Little Help needed moving to and from a bed to a chair (including a wheelchair)?: A Little Help needed standing up from a chair using your arms (e.g., wheelchair or bedside chair)?: A Little Help needed to walk in hospital room?: A Little Help needed climbing 3-5 steps with a railing? : A Little 6 Click Score: 19    End of Session Equipment Utilized During Treatment: Gait belt Activity Tolerance: Patient tolerated treatment well Patient left: in bed;with call bell/phone within reach (sitting EOB) Nurse Communication: Mobility status PT Visit Diagnosis: Unsteadiness on feet (R26.81);Pain Pain - part of body:  (back)     Time: 1430-1446 PT Time Calculation (min) (ACUTE ONLY): 16 min  Charges:  $Gait Training: 8-22 mins                     [name], DPT  Acute Rehabilitation Services  Pager: [phone] Office: [phone]    Rudean Hitt 08/29/2020, 4:48 PM

## 2020-08-29 NOTE — Progress Notes (Signed)
Monitors applied for NST- unable to maintain continuous tracing. Dr. Nelda Marseille notified. MD to bedside. RN held monitors on for duration of tracing Monitors removed at 1645 with MD approval. Will attempt NST again in an hour.

## 2020-08-29 NOTE — Plan of Care (Signed)
  Problem: Clinical Measurements: Goal: Ability to maintain clinical measurements within normal limits will improve Outcome: Progressing Goal: Will remain free from infection Outcome: Not Progressing   Problem: Nutrition: Goal: Adequate nutrition will be maintained Outcome: Progressing   Problem: Coping: Goal: Level of anxiety will decrease Outcome: Progressing   Problem: Pain Managment: Goal: General experience of comfort will improve Outcome: Progressing   Problem: Skin Integrity: Goal: Risk for impaired skin integrity will decrease Outcome: Not Progressing   Problem: Education: Goal: Knowledge of disease or condition will improve Outcome: Progressing Goal: Knowledge of the prescribed therapeutic regimen will improve Outcome: Progressing

## 2020-08-29 NOTE — Progress Notes (Signed)
[facility]) NOTE  Colleen Valentine is a 25 y.o. [phone] with Estimated Date of Delivery: 10/01/20   By  best clinical estimate [redacted]w[redacted]d  who is admitted for Admitted for epidural abscess- s/p laminotomies/decompression of abscess completed on 3/30.    Fetal presentation is cephalic. Length of Stay:  8  Days  Date of admission:08/21/2020  Subjective: Notes no acute complaints overnight.  Pain so far has been more tolerable.  Denies fever/chills.  Ambulation continues to improve.  Voiding without difficulty.   Patient reports the fetal movement as active. Patient reports uterine contraction  activity as none. Patient reports  vaginal bleeding as none. Patient describes fluid per vagina as None.  Vitals:  Blood pressure 118/71, pulse 94, temperature 97.8 F (36.6 C), temperature source Oral, resp. rate 18, height 5\' 5"  (1.651 m), weight 105.4 kg, last menstrual period 12/14/2019, SpO2 100 %, unknown if currently breastfeeding. Vitals:   08/28/20 1530 08/28/20 2001 08/29/20 0548 08/29/20 0800  BP:  122/70 120/71 118/71  Pulse:  (!) 108 84 94  Resp:  17 18   Temp: (!) 96.8 F (36 C) 97.9 F (36.6 C) 97.8 F (36.6 C) 97.8 F (36.6 C)  TempSrc: Axillary Oral Oral Oral  SpO2: 98% 99% 99% 100%  Weight:      Height:       Physical Examination:  General appearance - alert, well appearing, and in no distress Chest - CTAB Heart - normal rate and regular rhythm Abdomen - obese, soft and non-tender, no rebound, no guarding Back exam - bandage with minimal saturation, incision appears clean and dry with minimal erythema- improved from yesterday Extremities - no edema, no calf tenderness bilaterally Skin - warm and dry   Fetal Monitoring:  Baseline: 150 bpm, Variability: moderate, Accelerations: +accels, and Decelerations: Absent    reactive  Labs:  Results for orders placed or performed during the hospital encounter of 08/21/20 (from the past 24 hour(s))  Glucose,  capillary   Collection Time: 08/28/20 10:43 AM  Result Value Ref Range   Glucose-Capillary 74 70 - 99 mg/dL  Glucose, capillary   Collection Time: 08/28/20 12:57 PM  Result Value Ref Range   Glucose-Capillary 99 70 - 99 mg/dL  CBC with Differential/Platelet   Collection Time: 08/28/20  1:30 PM  Result Value Ref Range   WBC 6.1 4.0 - 10.5 K/uL   RBC 2.97 (L) 3.87 - 5.11 MIL/uL   Hemoglobin 8.7 (L) 12.0 - 15.0 g/dL   HCT 26.7 (L) 36.0 - 46.0 %   MCV 89.9 80.0 - 100.0 fL   MCH 29.3 26.0 - 34.0 pg   MCHC 32.6 30.0 - 36.0 g/dL   RDW 15.9 (H) 11.5 - 15.5 %   Platelets 270 150 - 400 K/uL   nRBC 0.0 0.0 - 0.2 %   Neutrophils Relative % 71 %   Neutro Abs 4.3 1.7 - 7.7 K/uL   Lymphocytes Relative 23 %   Lymphs Abs 1.4 0.7 - 4.0 K/uL   Monocytes Relative 4 %   Monocytes Absolute 0.2 0.1 - 1.0 K/uL   Eosinophils Relative 1 %   Eosinophils Absolute 0.1 0.0 - 0.5 K/uL   Basophils Relative 0 %   Basophils Absolute 0.0 0.0 - 0.1 K/uL   Immature Granulocytes 1 %   Abs Immature Granulocytes 0.08 (H) 0.00 - 0.07 K/uL  Glucose, capillary   Collection Time: 08/28/20  4:57 PM  Result Value Ref Range   Glucose-Capillary 74 70 - 99 mg/dL  Glucose, capillary   Collection Time: 08/28/20  9:51 PM  Result Value Ref Range   Glucose-Capillary 81 70 - 99 mg/dL   Comment 1 Notify RN    Comment 2 Document in Chart   Glucose, capillary   Collection Time: 08/29/20  5:50 AM  Result Value Ref Range   Glucose-Capillary 81 70 - 99 mg/dL   Medications:  Scheduled . Chlorhexidine Gluconate Cloth  6 each Topical Daily  . docusate sodium  100 mg Oral BID  . enoxaparin (LOVENOX) injection  60 mg Subcutaneous Q24H  . lamoTRIgine  25 mg Oral BID  . pantoprazole  40 mg Oral Daily  . prenatal multivitamin  1 tablet Oral Q1200  . senna  1 tablet Oral BID  . sodium chloride flush  10-40 mL Intracatheter Q12H  . sodium chloride flush  3 mL Intravenous Q12H  . sodium phosphate  1 enema Rectal Once  .  tetracaine  200 mg Infiltration Once  . valACYclovir  500 mg Oral BID   I have reviewed the patient's current medications.  ASSESSMENT: [id number] [redacted]w[redacted]d with epidural abscess s/p laminotomies/decompression on 3/30   PLAN: 1)Epidural abscess s/p drainage -management per neurosurgery- currently using betadine to clean site -clinically appears stable -CBC unremarkable -PICC line in place, currently on IV Daptomycin -ID on board -continue physical therapy  2)Vaginal bleeding -no further bleeding since Friday, currently asymptomatic -pt previously admitted for similar concern early March- received BMZ at that time and was monitored with resolution of bleeding  3) Fetal well-being -FWB- Cat. I, reactive NST -BMZ previously given  3) Maternal well being -Anxiety/Depression- currently on Lamictal 25mg  bid -PNV daily -valtrex bid -general diet -pt being seen by PT  -DVT prophylaxis-on Lovenox daily and will plan to continue until delivery  DISP:Continue with planned as outline above, appreciate neurosurgery input in terms of transition to care at home.  Janyth Pupa, DO Attending [place], [facility] for [employer], [place]

## 2020-08-30 ENCOUNTER — Encounter (HOSPITAL_COMMUNITY): Payer: Self-pay | Admitting: Obstetrics and Gynecology

## 2020-08-30 ENCOUNTER — Inpatient Hospital Stay (HOSPITAL_COMMUNITY): Payer: Medicaid Other | Admitting: Anesthesiology

## 2020-08-30 ENCOUNTER — Encounter (HOSPITAL_COMMUNITY)
Admission: AD | Disposition: A | Discharge status: Home / Self Care | Payer: Self-pay | Source: Home / Self Care | Attending: Obstetrics and Gynecology

## 2020-08-30 DIAGNOSIS — O42013 Preterm premature rupture of membranes, onset of labor within 24 hours of rupture, third trimester: Secondary | ICD-10-CM

## 2020-08-30 DIAGNOSIS — O24420 Gestational diabetes mellitus in childbirth, diet controlled: Secondary | ICD-10-CM

## 2020-08-30 DIAGNOSIS — O321XX0 Maternal care for breech presentation, not applicable or unspecified: Secondary | ICD-10-CM

## 2020-08-30 DIAGNOSIS — O99354 Diseases of the nervous system complicating childbirth: Secondary | ICD-10-CM

## 2020-08-30 LAB — PREPARE RBC (CROSSMATCH)

## 2020-08-30 LAB — AMNISURE RUPTURE OF MEMBRANE (ROM) NOT AT ARMC: Amnisure ROM: POSITIVE

## 2020-08-30 SURGERY — Surgical Case
Anesthesia: General

## 2020-08-30 MED ORDER — FENTANYL CITRATE (PF) 250 MCG/5ML IJ SOLN
INTRAMUSCULAR | Status: DC | PRN
  Administered 2020-08-30: 100 ug via INTRAVENOUS
  Administered 2020-08-30: 50 ug via INTRAVENOUS
  Administered 2020-08-30: 100 ug via INTRAVENOUS

## 2020-08-30 MED ORDER — ENOXAPARIN SODIUM 60 MG/0.6ML ~~LOC~~ SOLN
60.0000 mg | SUBCUTANEOUS | Status: DC
  Administered 2020-08-31 – 2020-09-03 (×4): 60 mg via SUBCUTANEOUS
  Filled 2020-08-30 (×4): qty 0.6

## 2020-08-30 MED ORDER — ROCURONIUM BROMIDE 100 MG/10ML IV SOLN
INTRAVENOUS | Status: DC | PRN
  Administered 2020-08-30: 30 mg via INTRAVENOUS

## 2020-08-30 MED ORDER — SOD CITRATE-CITRIC ACID 500-334 MG/5ML PO SOLN
30.0000 mL | ORAL | Status: AC
  Administered 2020-08-30: 30 mL via ORAL
  Filled 2020-08-30: qty 15

## 2020-08-30 MED ORDER — METOCLOPRAMIDE HCL 5 MG/ML IJ SOLN
INTRAMUSCULAR | Status: AC
  Filled 2020-08-30: qty 2

## 2020-08-30 MED ORDER — DEXAMETHASONE SODIUM PHOSPHATE 10 MG/ML IJ SOLN
INTRAMUSCULAR | Status: AC
  Filled 2020-08-30: qty 1

## 2020-08-30 MED ORDER — FENTANYL CITRATE (PF) 100 MCG/2ML IJ SOLN
INTRAMUSCULAR | Status: AC
  Filled 2020-08-30: qty 2

## 2020-08-30 MED ORDER — LACTATED RINGERS IV SOLN: INTRAVENOUS | Status: DC | PRN

## 2020-08-30 MED ORDER — ONDANSETRON HCL 4 MG/2ML IJ SOLN
INTRAMUSCULAR | Status: DC | PRN
  Administered 2020-08-30: 4 mg via INTRAMUSCULAR

## 2020-08-30 MED ORDER — SIMETHICONE 80 MG PO CHEW
80.0000 mg | CHEWABLE_TABLET | Freq: Three times a day (TID) | ORAL | Status: DC
  Administered 2020-08-30 – 2020-09-03 (×12): 80 mg via ORAL
  Filled 2020-08-30 (×12): qty 1

## 2020-08-30 MED ORDER — HYDROMORPHONE HCL 1 MG/ML IJ SOLN
0.5000 mg | Freq: Once | INTRAMUSCULAR | Status: AC
Start: 2020-08-30 — End: 2020-08-30
  Administered 2020-08-30: 0.5 mg via INTRAVENOUS
  Filled 2020-08-30: qty 1

## 2020-08-30 MED ORDER — DEXMEDETOMIDINE (PRECEDEX) IN NS 20 MCG/5ML (4 MCG/ML) IV SYRINGE
PREFILLED_SYRINGE | INTRAVENOUS | Status: AC
  Filled 2020-08-30: qty 10

## 2020-08-30 MED ORDER — KETAMINE HCL 10 MG/ML IJ SOLN
INTRAMUSCULAR | Status: DC | PRN
  Administered 2020-08-30: 30 mg via INTRAVENOUS

## 2020-08-30 MED ORDER — CEFAZOLIN SODIUM-DEXTROSE 2-4 GM/100ML-% IV SOLN: 2.0000 g | INTRAVENOUS | Status: DC

## 2020-08-30 MED ORDER — PROPOFOL 10 MG/ML IV BOLUS
INTRAVENOUS | Status: DC | PRN
  Administered 2020-08-30: 100 mg via INTRAVENOUS

## 2020-08-30 MED ORDER — STERILE WATER FOR IRRIGATION IR SOLN
Status: DC | PRN
  Administered 2020-08-30: 1

## 2020-08-30 MED ORDER — LACTATED RINGERS IV SOLN: INTRAVENOUS | Status: DC

## 2020-08-30 MED ORDER — SUGAMMADEX SODIUM 200 MG/2ML IV SOLN
INTRAVENOUS | Status: DC | PRN
  Administered 2020-08-30: 200 mg via INTRAVENOUS

## 2020-08-30 MED ORDER — KETAMINE HCL 50 MG/5ML IJ SOSY
PREFILLED_SYRINGE | INTRAMUSCULAR | Status: AC
  Filled 2020-08-30: qty 5

## 2020-08-30 MED ORDER — MIDAZOLAM HCL 2 MG/2ML IJ SOLN
INTRAMUSCULAR | Status: DC | PRN
  Administered 2020-08-30: 2 mg via INTRAVENOUS

## 2020-08-30 MED ORDER — COCONUT OIL OIL: 1.0000 "application " | TOPICAL_OIL | Status: DC | PRN

## 2020-08-30 MED ORDER — CEFAZOLIN SODIUM-DEXTROSE 2-3 GM-%(50ML) IV SOLR
INTRAVENOUS | Status: DC | PRN
  Administered 2020-08-30: 2 g via INTRAVENOUS

## 2020-08-30 MED ORDER — ONDANSETRON HCL 4 MG/2ML IJ SOLN
INTRAMUSCULAR | Status: AC
  Filled 2020-08-30: qty 2

## 2020-08-30 MED ORDER — SODIUM CHLORIDE 0.9 % IV SOLN
INTRAVENOUS | Status: AC
  Filled 2020-08-30: qty 500

## 2020-08-30 MED ORDER — ACETAMINOPHEN 10 MG/ML IV SOLN: 1000.0000 mg | Freq: Once | INTRAVENOUS | Status: DC | PRN

## 2020-08-30 MED ORDER — DIBUCAINE (PERIANAL) 1 % EX OINT: 1.0000 "application " | TOPICAL_OINTMENT | CUTANEOUS | Status: DC | PRN

## 2020-08-30 MED ORDER — DEXAMETHASONE SODIUM PHOSPHATE 4 MG/ML IJ SOLN
INTRAMUSCULAR | Status: DC | PRN
  Administered 2020-08-30: 8 mg via INTRAVENOUS

## 2020-08-30 MED ORDER — DEXAMETHASONE SODIUM PHOSPHATE 4 MG/ML IJ SOLN
INTRAMUSCULAR | Status: AC
  Filled 2020-08-30: qty 1

## 2020-08-30 MED ORDER — ACETAMINOPHEN 10 MG/ML IV SOLN
INTRAVENOUS | Status: AC
  Filled 2020-08-30: qty 100

## 2020-08-30 MED ORDER — ACETAMINOPHEN 500 MG PO TABS
1000.0000 mg | ORAL_TABLET | Freq: Four times a day (QID) | ORAL | Status: DC
  Administered 2020-08-30 – 2020-09-03 (×14): 1000 mg via ORAL
  Filled 2020-08-30 (×16): qty 2

## 2020-08-30 MED ORDER — MENTHOL 3 MG MT LOZG: 1.0000 | LOZENGE | OROMUCOSAL | Status: DC | PRN

## 2020-08-30 MED ORDER — DIPHENHYDRAMINE HCL 12.5 MG/5ML PO ELIX: 12.5000 mg | ORAL_SOLUTION | Freq: Four times a day (QID) | ORAL | Status: DC | PRN

## 2020-08-30 MED ORDER — OXYCODONE HCL 5 MG/5ML PO SOLN: 5.0000 mg | Freq: Once | ORAL | Status: DC | PRN

## 2020-08-30 MED ORDER — OXYTOCIN-SODIUM CHLORIDE 30-0.9 UT/500ML-% IV SOLN
2.5000 [IU]/h | INTRAVENOUS | Status: AC
  Administered 2020-08-30: 2.5 [IU]/h via INTRAVENOUS

## 2020-08-30 MED ORDER — FENTANYL CITRATE (PF) 250 MCG/5ML IJ SOLN
INTRAMUSCULAR | Status: AC
  Filled 2020-08-30: qty 5

## 2020-08-30 MED ORDER — ACETAMINOPHEN 10 MG/ML IV SOLN
INTRAVENOUS | Status: DC | PRN
  Administered 2020-08-30: 1000 mg via INTRAVENOUS

## 2020-08-30 MED ORDER — SOD CITRATE-CITRIC ACID 500-334 MG/5ML PO SOLN: 30.0000 mL | ORAL | Status: DC | PRN

## 2020-08-30 MED ORDER — HYDROMORPHONE HCL 1 MG/ML IJ SOLN: 0.2000 mg | INTRAMUSCULAR | Status: DC | PRN

## 2020-08-30 MED ORDER — SODIUM CHLORIDE 0.9% FLUSH: 9.0000 mL | INTRAVENOUS | Status: DC | PRN

## 2020-08-30 MED ORDER — DEXMEDETOMIDINE (PRECEDEX) IN NS 20 MCG/5ML (4 MCG/ML) IV SYRINGE
PREFILLED_SYRINGE | INTRAVENOUS | Status: DC | PRN
  Administered 2020-08-30 (×2): 8 ug via INTRAVENOUS
  Administered 2020-08-30: 4 ug via INTRAVENOUS
  Administered 2020-08-30: 8 ug via INTRAVENOUS

## 2020-08-30 MED ORDER — OXYTOCIN-SODIUM CHLORIDE 30-0.9 UT/500ML-% IV SOLN: 2.5000 [IU]/h | INTRAVENOUS | Status: DC

## 2020-08-30 MED ORDER — LIDOCAINE HCL (PF) 1 % IJ SOLN: 30.0000 mL | INTRAMUSCULAR | Status: DC | PRN

## 2020-08-30 MED ORDER — OXYTOCIN BOLUS FROM INFUSION: 333.0000 mL | Freq: Once | INTRAVENOUS | Status: DC

## 2020-08-30 MED ORDER — OXYCODONE HCL 5 MG PO TABS: 5.0000 mg | ORAL_TABLET | Freq: Once | ORAL | Status: DC | PRN

## 2020-08-30 MED ORDER — SODIUM CHLORIDE 0.9 % IV SOLN
INTRAVENOUS | Status: DC | PRN
  Administered 2020-08-30: 500 mg via INTRAVENOUS

## 2020-08-30 MED ORDER — DIPHENHYDRAMINE HCL 50 MG/ML IJ SOLN: 12.5000 mg | Freq: Four times a day (QID) | INTRAMUSCULAR | Status: DC | PRN

## 2020-08-30 MED ORDER — SUCCINYLCHOLINE CHLORIDE 20 MG/ML IJ SOLN
INTRAMUSCULAR | Status: DC | PRN
  Administered 2020-08-30: 100 mg via INTRAVENOUS

## 2020-08-30 MED ORDER — SUCCINYLCHOLINE CHLORIDE 200 MG/10ML IV SOSY
PREFILLED_SYRINGE | INTRAVENOUS | Status: AC
  Filled 2020-08-30: qty 10

## 2020-08-30 MED ORDER — NALOXONE HCL 0.4 MG/ML IJ SOLN: 0.4000 mg | INTRAMUSCULAR | Status: DC | PRN

## 2020-08-30 MED ORDER — ONDANSETRON HCL 4 MG/2ML IJ SOLN
4.0000 mg | Freq: Once | INTRAMUSCULAR | Status: DC | PRN
Start: 2020-08-30 — End: 2020-08-30

## 2020-08-30 MED ORDER — SODIUM CHLORIDE 0.9 % IR SOLN
Status: DC | PRN
  Administered 2020-08-30: 1

## 2020-08-30 MED ORDER — SODIUM CHLORIDE 0.9% IV SOLUTION: Freq: Once | INTRAVENOUS | Status: DC

## 2020-08-30 MED ORDER — TETANUS-DIPHTH-ACELL PERTUSSIS 5-2.5-18.5 LF-MCG/0.5 IM SUSY: 0.5000 mL | PREFILLED_SYRINGE | Freq: Once | INTRAMUSCULAR | Status: DC

## 2020-08-30 MED ORDER — ACETAMINOPHEN 325 MG PO TABS: 650.0000 mg | ORAL_TABLET | ORAL | Status: DC | PRN

## 2020-08-30 MED ORDER — LIDOCAINE HCL (CARDIAC) PF 50 MG/5ML IV SOSY
PREFILLED_SYRINGE | INTRAVENOUS | Status: DC | PRN
  Administered 2020-08-30: 100 mg via INTRAVENOUS

## 2020-08-30 MED ORDER — WITCH HAZEL-GLYCERIN EX PADS: 1.0000 "application " | MEDICATED_PAD | CUTANEOUS | Status: DC | PRN

## 2020-08-30 MED ORDER — LACTATED RINGERS IV SOLN: 500.0000 mL | INTRAVENOUS | Status: DC | PRN

## 2020-08-30 MED ORDER — HYDROMORPHONE HCL 1 MG/ML IJ SOLN
INTRAMUSCULAR | Status: DC | PRN
  Administered 2020-08-30: .5 mg via INTRAVENOUS
  Administered 2020-08-30: 1 mg via INTRAVENOUS
  Administered 2020-08-30 (×3): .5 mg via INTRAVENOUS

## 2020-08-30 MED ORDER — ONDANSETRON HCL 4 MG/2ML IJ SOLN: 4.0000 mg | Freq: Four times a day (QID) | INTRAMUSCULAR | Status: DC | PRN

## 2020-08-30 MED ORDER — PROPOFOL 10 MG/ML IV BOLUS
INTRAVENOUS | Status: AC
  Filled 2020-08-30: qty 20

## 2020-08-30 MED ORDER — OXYCODONE-ACETAMINOPHEN 5-325 MG PO TABS: 2.0000 | ORAL_TABLET | ORAL | Status: DC | PRN

## 2020-08-30 MED ORDER — SENNOSIDES-DOCUSATE SODIUM 8.6-50 MG PO TABS
2.0000 | ORAL_TABLET | ORAL | Status: DC
  Administered 2020-08-30 – 2020-09-03 (×5): 2 via ORAL
  Filled 2020-08-30 (×5): qty 2

## 2020-08-30 MED ORDER — OXYTOCIN-SODIUM CHLORIDE 30-0.9 UT/500ML-% IV SOLN
INTRAVENOUS | Status: AC
  Filled 2020-08-30: qty 500

## 2020-08-30 MED ORDER — HYDROMORPHONE HCL 1 MG/ML IJ SOLN
INTRAMUSCULAR | Status: AC
  Filled 2020-08-30: qty 0.5

## 2020-08-30 MED ORDER — MORPHINE SULFATE (PF) 0.5 MG/ML IJ SOLN
INTRAMUSCULAR | Status: AC
  Filled 2020-08-30: qty 10

## 2020-08-30 MED ORDER — SIMETHICONE 80 MG PO CHEW: 80.0000 mg | CHEWABLE_TABLET | ORAL | Status: DC | PRN

## 2020-08-30 MED ORDER — METOCLOPRAMIDE HCL 5 MG/ML IJ SOLN
INTRAMUSCULAR | Status: DC | PRN
  Administered 2020-08-30: 10 mg via INTRAVENOUS

## 2020-08-30 MED ORDER — OXYTOCIN-SODIUM CHLORIDE 30-0.9 UT/500ML-% IV SOLN
INTRAVENOUS | Status: DC | PRN
  Administered 2020-08-30: 30 mL via INTRAVENOUS

## 2020-08-30 MED ORDER — DIPHENHYDRAMINE HCL 25 MG PO CAPS: 25.0000 mg | ORAL_CAPSULE | Freq: Four times a day (QID) | ORAL | Status: DC | PRN

## 2020-08-30 MED ORDER — SODIUM CHLORIDE 0.9 % IV SOLN: INTRAVENOUS | Status: DC | PRN

## 2020-08-30 MED ORDER — MIDAZOLAM HCL 2 MG/2ML IJ SOLN
INTRAMUSCULAR | Status: AC
  Filled 2020-08-30: qty 2

## 2020-08-30 MED ORDER — PRENATAL MULTIVITAMIN CH
1.0000 | ORAL_TABLET | Freq: Every day | ORAL | Status: DC
  Administered 2020-08-30 – 2020-09-03 (×5): 1 via ORAL
  Filled 2020-08-30 (×5): qty 1

## 2020-08-30 MED ORDER — SCOPOLAMINE 1 MG/3DAYS TD PT72
MEDICATED_PATCH | TRANSDERMAL | Status: DC | PRN
  Administered 2020-08-30: 1 via TRANSDERMAL

## 2020-08-30 MED ORDER — OXYCODONE-ACETAMINOPHEN 5-325 MG PO TABS: 1.0000 | ORAL_TABLET | ORAL | Status: DC | PRN

## 2020-08-30 MED ORDER — POLYETHYLENE GLYCOL 3350 17 G PO PACK
17.0000 g | PACK | Freq: Two times a day (BID) | ORAL | Status: DC
  Administered 2020-08-30 – 2020-09-03 (×8): 17 g via ORAL
  Filled 2020-08-30 (×8): qty 1

## 2020-08-30 MED ORDER — HYDROMORPHONE HCL 1 MG/ML IJ SOLN
INTRAMUSCULAR | Status: AC
  Filled 2020-08-30: qty 3

## 2020-08-30 MED ORDER — HYDROMORPHONE 1 MG/ML IV SOLN
INTRAVENOUS | Status: DC
  Administered 2020-08-30: 2.4 mg via INTRAVENOUS
  Administered 2020-08-30: 2.6 mg via INTRAVENOUS
  Administered 2020-08-30: 30 mg via INTRAVENOUS
  Administered 2020-08-31: 2.7 mg via INTRAVENOUS
  Administered 2020-08-31: 2.1 mg via INTRAVENOUS
  Filled 2020-08-30: qty 30

## 2020-08-30 MED ORDER — HYDROMORPHONE HCL 1 MG/ML IJ SOLN
0.2500 mg | INTRAMUSCULAR | Status: DC | PRN
  Administered 2020-08-30 (×2): 0.25 mg via INTRAVENOUS

## 2020-08-30 MED ORDER — CEFAZOLIN SODIUM-DEXTROSE 2-4 GM/100ML-% IV SOLN
INTRAVENOUS | Status: AC
  Filled 2020-08-30: qty 100

## 2020-08-30 MED ORDER — ZOLPIDEM TARTRATE 5 MG PO TABS: 5.0000 mg | ORAL_TABLET | Freq: Every evening | ORAL | Status: DC | PRN

## 2020-08-30 MED ORDER — SODIUM CHLORIDE 0.9 % IV SOLN
500.0000 mg | INTRAVENOUS | Status: AC
  Filled 2020-08-30: qty 500

## 2020-08-30 MED ORDER — OXYCODONE HCL 5 MG PO TABS
5.0000 mg | ORAL_TABLET | ORAL | Status: DC | PRN
  Administered 2020-08-31 – 2020-09-01 (×5): 10 mg via ORAL
  Administered 2020-09-01: 5 mg via ORAL
  Administered 2020-09-01 (×3): 10 mg via ORAL
  Administered 2020-09-01: 5 mg via ORAL
  Administered 2020-09-02 – 2020-09-03 (×8): 10 mg via ORAL
  Filled 2020-08-30 (×19): qty 2

## 2020-08-30 SURGICAL SUPPLY — 39 items
APL SKNCLS STERI-STRIP NONHPOA (GAUZE/BANDAGES/DRESSINGS) ×1
BENZOIN TINCTURE PRP APPL 2/3 (GAUZE/BANDAGES/DRESSINGS) ×2 IMPLANT
CANISTER WOUND CARE 500ML ATS (WOUND CARE) ×2 IMPLANT
CHLORAPREP W/TINT 26ML (MISCELLANEOUS) ×2 IMPLANT
CLAMP CORD UMBIL (MISCELLANEOUS) IMPLANT
CLOTH BEACON ORANGE TIMEOUT ST (SAFETY) ×2 IMPLANT
DRESSING PREVENA PLUS CUSTOM (GAUZE/BANDAGES/DRESSINGS) ×1 IMPLANT
DRSG OPSITE POSTOP 4X10 (GAUZE/BANDAGES/DRESSINGS) ×2 IMPLANT
DRSG PREVENA PLUS CUSTOM (GAUZE/BANDAGES/DRESSINGS) ×2
ELECT REM PT RETURN 9FT ADLT (ELECTROSURGICAL) ×2
ELECTRODE REM PT RTRN 9FT ADLT (ELECTROSURGICAL) ×1 IMPLANT
EXTRACTOR VACUUM M CUP 4 TUBE (SUCTIONS) IMPLANT
GLOVE BIOGEL PI IND STRL 7.0 (GLOVE) ×2 IMPLANT
GLOVE BIOGEL PI IND STRL 7.5 (GLOVE) ×2 IMPLANT
GLOVE BIOGEL PI INDICATOR 7.0 (GLOVE) ×2
GLOVE BIOGEL PI INDICATOR 7.5 (GLOVE) ×2
GLOVE ECLIPSE 7.5 STRL STRAW (GLOVE) ×2 IMPLANT
GOWN STRL REUS W/TWL LRG LVL3 (GOWN DISPOSABLE) ×6 IMPLANT
HOVERMATT SINGLE USE (MISCELLANEOUS) ×2 IMPLANT
KIT ABG SYR 3ML LUER SLIP (SYRINGE) IMPLANT
NEEDLE HYPO 25X5/8 SAFETYGLIDE (NEEDLE) IMPLANT
NS IRRIG 1000ML POUR BTL (IV SOLUTION) ×2 IMPLANT
PACK C SECTION WH (CUSTOM PROCEDURE TRAY) ×2 IMPLANT
PAD OB MATERNITY 4.3X12.25 (PERSONAL CARE ITEMS) ×2 IMPLANT
PENCIL SMOKE EVAC W/HOLSTER (ELECTROSURGICAL) ×2 IMPLANT
RETRACTOR TRAXI PANNICULUS (MISCELLANEOUS) ×1 IMPLANT
RTRCTR C-SECT PINK 25CM LRG (MISCELLANEOUS) ×2 IMPLANT
STRIP CLOSURE SKIN 1/2X4 (GAUZE/BANDAGES/DRESSINGS) ×2 IMPLANT
SUT PLAIN 2 0 XLH (SUTURE) ×2 IMPLANT
SUT VIC AB 0 CT1 36 (SUTURE) ×2 IMPLANT
SUT VIC AB 0 CTX 36 (SUTURE) ×4
SUT VIC AB 0 CTX36XBRD ANBCTRL (SUTURE) ×2 IMPLANT
SUT VIC AB 2-0 CT1 27 (SUTURE) ×2
SUT VIC AB 2-0 CT1 TAPERPNT 27 (SUTURE) ×1 IMPLANT
SUT VIC AB 4-0 KS 27 (SUTURE) ×2 IMPLANT
TOWEL OR 17X24 6PK STRL BLUE (TOWEL DISPOSABLE) ×2 IMPLANT
TRAXI PANNICULUS RETRACTOR (MISCELLANEOUS) ×1
TRAY FOLEY W/BAG SLVR 14FR LF (SET/KITS/TRAYS/PACK) ×2 IMPLANT
WATER STERILE IRR 1000ML POUR (IV SOLUTION) ×2 IMPLANT

## 2020-08-30 NOTE — Progress Notes (Signed)
Patient ID: Colleen Valentine, female   DOB: 24-Apr-1996, 25 y.o.   MRN: [id number] Patient is off her C-section this morning after events of last night noted.  Dressing changes to continue.  I have asked Dr. Vertell Limber to check in on patient tomorrow as I will be out of town for the next couple of days.

## 2020-08-30 NOTE — Progress Notes (Signed)
Labor Progress Note Colleen Valentine is a 25 y.o. [phone] at [redacted]w[redacted]d presented for epidural abscess s/p laminotomies/decompresssion of abscess on 08/23/20. Pt continued on daptomycin. She was transferred to L&D overnight given PPROM at [facility] on 4/6 s/p BMZ on 3/29, 3/30.  S: No concerns at this time. Pt's partner at bedside. Pt updated on plan for OR later in the AM given breech fetal presentation as noted below.  O:  BP 119/70 (BP Location: Right Arm)   Pulse 93   Temp 97.9 F (36.6 C) (Oral)   Resp 18   Ht 5\' 5"  (1.651 m)   Wt 105.4 kg   LMP 12/14/2019 Comment: has had some spotting  SpO2 97%   Breastfeeding Unknown Comment: Colleen Valentine obrr present in or  BMI 38.67 kg/m  EFM: baseline 150/moderate variability/+accels/no decels Toco: rare contractions  CVE: Dilation: Fingertip Effacement (%): Thick Cervical Position: Posterior Exam by:: Dr. Astrid Valentine   A&P: 25 y.o. [id number] [redacted]w[redacted]d presented for epidural abscess s/p laminotomies/decompresssion of abscess on 08/23/20. Pt continued on daptomycin. She was transferred to L&D overnight given PPROM at [facility] on 4/6 s/p BMZ on 3/29, 3/30. #Labor: +amnisure s/p gush of fluid at 0015. On arrival to L&D, pt found to be in footling breech presentation by bedside ultrasound. Cervical exam fingertip/thick/high. Given pt not in active labor, plan for primary Cesarean in the AM. Dr. Elly Valentine notified and OR called. #Pain: prn medications--unable to receive neuro-axial anesthesia given epidural abscess #FWB: Category 1 strip #GBS negative #Anemia: Hgb 8.7 >venofer on 4/3. Plan for repeat CBC s/p OR. #Epidural Abscess: continue IV daptomycin via PICC line. ID and Neurosurgery following. Continue PT in postpartum period. Will plan to restart lovenox s/p Cesarean. #AnxietyDepression: continue home lamictal 25mg  BID #H/o HSV: valtrex BID #A1GDM: BG checks ordered on transfer to L&D. Will plan for PPD#1 FBG.  Randa Ngo, MD 5:47 AM

## 2020-08-30 NOTE — Anesthesia Preprocedure Evaluation (Addendum)
Anesthesia Evaluation  Patient identified by MRN, date of birth, ID band Patient awake    Reviewed: Allergy & Precautions, NPO status , Patient's Chart, lab work & pertinent test results  History of Anesthesia Complications (+) PONV and history of anesthetic complications  Airway Mallampati: II  TM Distance: >3 FB Neck ROM: Full    Dental no notable dental hx. (+) Teeth Intact, Dental Advisory Given   Pulmonary former smoker,    Pulmonary exam normal breath sounds clear to auscultation       Cardiovascular Exercise Tolerance: Good Normal cardiovascular exam Rhythm:Regular Rate:Normal     Neuro/Psych Seizures -,  Anxiety    GI/Hepatic GERD  Medicated and Controlled,(+)     substance abuse  ,   Endo/Other  diabetes  Renal/GU      Musculoskeletal S/P drainage of epidural abcess T12-L1   Abdominal (+) + obese,   Peds  Hematology  (+) anemia , Lab Results      Component                Value               Date                      WBC                      6.1                 08/28/2020                HGB                      8.7 (L)             08/28/2020                HCT                      26.7 (L)            08/28/2020                MCV                      89.9                08/28/2020                PLT                      270                 08/28/2020              Anesthesia Other Findings   Reproductive/Obstetrics (+) Pregnancy                            Anesthesia Physical Anesthesia Plan  ASA: III  Anesthesia Plan: General   Post-op Pain Management:    Induction: Intravenous  PONV Risk Score and Plan: Treatment may vary due to age or medical condition, Ondansetron, Dexamethasone and Scopolamine patch - Pre-op  Airway Management Planned: Oral ETT  Additional Equipment: None  Intra-op Plan:   Post-operative Plan: Extubation in OR  Informed Consent: I have  reviewed the patients History and Physical, chart, labs and discussed  the procedure including the risks, benefits and alternatives for the proposed anesthesia with the patient or authorized representative who has indicated his/her understanding and acceptance.     Dental advisory given  Plan Discussed with: CRNA and Anesthesiologist  Anesthesia Plan Comments:        Anesthesia Quick Evaluation

## 2020-08-30 NOTE — Op Note (Signed)
Operative Note   SURGERY DATE: 08/30/2020  PRE-OP DIAGNOSIS:  *Pregnancy at [redacted]w[redacted]d *PPROM *Fetal Malpresentation *POD#6 s/p laminotomies for epidural abscess  POST-OP DIAGNOSIS: Same   PROCEDURE: primary low transverse cesarean section via pfannenstiel skin incision with double layer uterine closure  SURGEON: Surgeon(s) and Role:    * Wouk, Ailene Rud, MD - Primary    * Rabon Scholle, Placido Sou, MD - Fellow  ANESTHESIA: general  ESTIMATED BLOOD LOSS: 546  DRAINS: 2mL UOP via indwelling foley  TOTAL IV FLUIDS: 10 mL crystalloid  VTE PROPHYLAXIS: SCDs to bilateral lower extremities  ANTIBIOTICS: IV Daptomycin, within 1 hour of skin incision  SPECIMENS: placenta to pathology  COMPLICATIONS: none  INDICATIONS: PPROM at [redacted]w[redacted]d with fetal malpresentation   FINDINGS: No intra-abdominal adhesions were noted. Grossly normal uterus, tubes and ovaries. clear amniotic fluid, breech female infant, weight pending, APGARs 5/7/8, intact placenta.  PROCEDURE IN DETAIL: The patient was taken to the operating room where  normal fetal heart tones were confirmed. She was then prepped and draped in the normal fashion in the dorsal supine position with a leftward tilt.  After a time out was performed, patient was placed under general anesthesia and a pfannensteil skin incision was made with the scalpel and carried through to the underlying layer of fascia. The fascia was then incised at the midline and this incision was extended laterally with the mayo scissors. Attention was turned to the superior aspect of the fascial incision which was grasped with the kocher clamps x 2, tented up and the rectus muscles were dissected off with the mayo scissors. In a similar fashion the inferior aspect of the fascial incision was grasped with the kocher clamps, tented up and the rectus muscles dissected off with the mayo scissors. The rectus muscles were then separated in the midline and the peritoneum was entered bluntly.  The [employer] was inserted.   A low transverse hysterotomy was made with the scalpel until the endometrial cavity was breached and the amniotic sac ruptured, yielding clear amniotic fluid. This incision was extended bluntly and the infant was delivered using standard breech maneuvers. The cord was clamped x 2 and cut, and the infant was handed to the awaiting pediatricians, after delayed cord clamping was done.  The placenta was then gradually expressed from the uterus and then the uterus was cleared of all clots and debris. The hysterotomy was repaired with a running suture of 0 Vicryl. A second imbricating layer of 0 Vicryl suture was then placed. A single figure-of-eight suture 0 vicryl was added to achieve excellent hemostasis.    The hysterotomy and all operative sites were reinspected and excellent hemostasis was noted after irrigation and suction of the abdomen with warm saline.  The peritoneum was closed with a running stitch of 3-0 Vicryl. The fascia was reapproximated with 0 Vicryl in a simple running fashion bilaterally. The subcutaneous layer was then reapproximated with interrupted sutures of 2-0 plain gut, and the skin was then closed with 4-0 vicryl, in a subcuticular fashion. A Prevena wound vac was placed over the incision.   The patient  tolerated the procedure well. Sponge, lap, needle, and instrument counts were correct x 2. The patient was transferred to the recovery room awake, alert and breathing independently in stable condition.   Sharene Skeans, MD [facility] Fellow, [facility] for [facility], [place]

## 2020-08-30 NOTE — Transfer of Care (Signed)
Immediate Anesthesia Transfer of Care Note  Patient: Colleen Valentine  Procedure(s) Performed: CESAREAN SECTION (N/A )  Patient Location: PACU  Anesthesia Type:General  Level of Consciousness: awake, alert  and oriented  Airway & Oxygen Therapy: Patient Spontanous Breathing and Patient connected to nasal cannula oxygen  Post-op Assessment: Report given to RN and Post -op Vital signs reviewed and stable  Post vital signs: Reviewed and stable  Last Vitals:  Vitals Value Taken Time  BP 133/70 08/30/20 1315  Temp 36.7 C 08/30/20 1230  Pulse 103 08/30/20 1324  Resp 11 08/30/20 1324  SpO2 97 % 08/30/20 1324  Vitals shown include unvalidated device data.  Last Pain:  Vitals:   08/30/20 1315  TempSrc:   PainSc: 10-Worst pain ever      Patients Stated Pain Goal: 5 (28/41/32 4401)  Complications: No complications documented.

## 2020-08-30 NOTE — Progress Notes (Signed)
Late entry note  Initial visit with Colleen Valentine to introduce spiritual care and offer support during her hospital stay. Colleen Valentine reports her back is hurting her, but the hardest part of her stay is being away from her 39 month old son Tourist information centre manager. She is grateful to have a good sitter caring for him without charge, but is still missing him. Colleen Valentine shared about the opportunity the hospitalization has given her to reflect on the things in life that she has taken for grated so that she might approach them in a different way that truly reflects the gratitude she feels. She has overcome several hardships and finds meaning by acknowledging the ways her resilience has strengthened her faith and might also encourage others. She is feeling hopeful about being discharged soon and has plans [place]. Colleen Valentine is grateful to have a supportive partner and surrogate family.  Please page as further needs arise.  Donald Prose. Elyn Peers, M.Div. [facility] Chaplain Pager [phone] Office [phone]

## 2020-08-30 NOTE — Lactation Note (Signed)
This note was copied from a baby's chart. Lactation Consultation Note Late entry for 2200 Had a long consult w/mom.  Mom had DEBP at bedside. Has pumped once stated she didn't get anything. Explained that is normal. Need to pump for stimulation.  Noted mom's breast are wide space tubular breast. Rt. Nipple inverted, Lt. Nipple flat. Both are compressible, Rt. Less  Than Lt. attempted to latch in football hold. Baby unable to latch. LC pre-pumped nipple to evert, didn't work. Mom stated she had a NS somewhere. LC unable to find it. [place] fitted mom #20 NS. Took a little bit for baby to latch. Then she only suckled a few times the stopped. Very sleepy. Hand expressed a few drops of colostrum. Mom demonstrated hand expression. Gave the drops to baby.  Got mom pumping while LC gave formula to baby. Yellow nipple to fast. Purple nipple used and in side lying position the baby took formula well.   Mom shown how to use DEBP & how to disassemble, clean, & reassemble parts. Mom knows to pump q3h for 15-20 min.  Mom doesn't have her bra with her. LC will get mom shells and a wrap to wear to hold shells in place. Mom has 59 month old at home. She BF for 2 days. Stated he couldn't latch well. Mom stated she pumped and bottle fed for 1 month. Mom wishes to BF this baby.  LPI information sheet given to mom and reviewed.  Discussed importance of STS and I&O. Newborn behavior of LPI and feeding habits reviewed. Mom encouraged to feed baby 8-12 times/24 hours and with feeding cues.   Encouraged mom to call for assistance. Lactation brochure given. Mom has [place]. Referral will be sent.  Patient Name: Colleen Valentine [id number] Date: 08/30/2020 Reason for consult: Late-preterm 34-36.6wks;Maternal endocrine disorder Age:30 hours  Maternal Data Has patient been taught Hand Expression?: Yes Does the patient have breastfeeding experience prior to this delivery?: Yes How long did the patient breastfeed?: BF 2  days, pumped 1 month  Feeding Nipple Type: Slow - flow  LATCH Score Latch: Too sleepy or reluctant, no latch achieved, no sucking elicited.  Audible Swallowing: None  Type of Nipple: Inverted  Comfort (Breast/Nipple): Soft / non-tender  Hold (Positioning): Full assist, staff holds infant at breast  LATCH Score: 2   Lactation Tools Discussed/Used Tools: Pump;Nipple Shields Nipple shield size: 20 Breast pump type: Double-Electric Breast Pump Pump Education: Setup, frequency, and cleaning;Milk Storage Reason for Pumping: inverted/flat nipples/LPI Pumping frequency: Q 3hr Pumped volume: 0 mL  Interventions Interventions: Breast feeding basics reviewed;Support pillows;Assisted with latch;Position options;Skin to skin;Expressed milk;Breast massage;Hand express;Pre-pump if needed;DEBP;Breast compression;Adjust position  Discharge Pump: DEBP WIC Program: Yes  Consult Status Consult Status: Follow-up Date: 08/31/20 Follow-up type: In-patient    Theodoro Kalata 08/30/2020, 11:35 PM

## 2020-08-30 NOTE — Progress Notes (Signed)
Pt states pain 10/10 after receiving .5mg  IV dilaudid in PACU and multiple pain medications in OR.  Pt is talking and texting on the phone and asleep at times.  Dr. Valma Cava at bedside to administer more pain medication.  Pt educated about pain medication and expectations of pain control after surgery.  Will continue to monitor.

## 2020-08-30 NOTE — Progress Notes (Addendum)
Patient is a [id number] at [redacted]w[redacted]d. Confirmed PPROM overnight and found to be breech. Plan for primary cesarean section today under general anesthesia.  The risks of surgery were discussed with the patient including but were not limited to: bleeding which may require transfusion or reoperation; infection which may require antibiotics; injury to bowel, bladder, ureters or other surrounding organs; injury to the fetus; need for additional procedures including hysterectomy in the event of a life-threatening hemorrhage; formation of adhesions; placental abnormalities with subsequent pregnancies; incisional problems; thromboembolic phenomenon and other postoperative/anesthesia complications.  The patient concurred with the proposed plan, giving informed written consent for the procedure.   Patient has been NPO since midnight she will remain NPO for procedure. Anesthesia and OR aware. Preoperative prophylactic antibiotics and SCDs ordered on call to the OR.  To OR when ready.   Sharene Skeans, MD [facility] Family [facility] Fellow, [facility] for [facility], [place] of Attending Supervision of Provider:  Evaluation and management procedures were performed by this provider under my supervision and collaboration. I have reviewed the provider's note and chart, and I agree with the management and plan. Breech presentation confirmed prior to surgery w/ u/s.   Laurey Arrow, MD Faculty Practice, [facility]

## 2020-08-30 NOTE — Lactation Note (Signed)
This note was copied from a baby's chart. Lactation Consultation Note  Patient Name: Colleen Valentine AVWPV'X Date: 08/30/2020   Age:23 hours   LC Initial Note:  RN called for lactation assistance tonight.  She plans to set up the DEBP and [place] to follow up later today.   Maternal Data    Feeding    LATCH Score Latch: Repeated attempts needed to sustain latch, nipple held in mouth throughout feeding, stimulation needed to elicit sucking reflex.  Audible Swallowing: Spontaneous and intermittent  Type of Nipple: Flat  Comfort (Breast/Nipple): Soft / non-tender  Hold (Positioning): Assistance needed to correctly position infant at breast and maintain latch.  LATCH Score: 7   Lactation Tools Discussed/Used    Interventions Interventions: Skin to skin;Hand express;Breast massage;Adjust position;Support pillows;Assisted with latch (nipple sheild used to assist latch)  Discharge    Consult Status      [name] 08/30/2020, 6:44 PM

## 2020-08-30 NOTE — Anesthesia Procedure Notes (Signed)
Procedure Name: Intubation Date/Time: 08/30/2020 11:19 AM Performed by: Genevie Ann, CRNA Pre-anesthesia Checklist: Patient identified, Emergency Drugs available, Suction available, Patient being monitored and Timeout performed Patient Re-evaluated:Patient Re-evaluated prior to induction Oxygen Delivery Method: Circle system utilized Preoxygenation: Pre-oxygenation with 100% oxygen Induction Type: IV induction and Rapid sequence Laryngoscope Size: Mac and 3 Grade View: Grade I Tube type: Oral Tube size: 7.0 mm Number of attempts: 1 Airway Equipment and Method: Video-laryngoscopy Placement Confirmation: ETT inserted through vocal cords under direct vision Secured at: 22 cm Dental Injury: Teeth and Oropharynx as per pre-operative assessment

## 2020-08-30 NOTE — Progress Notes (Signed)
Patient ID: Colleen Valentine, female   DOB: December 19, 1995, 25 y.o.   MRN: [id number] Called by nurse with patient reporting gush of pink tinged fluid. Amniosure confirms rupture of membrane. Dr. Reatha Armour, on call neurosurgeon, sees no contraindications to vaginal delivery. Dr. Annamaria Boots, MFM on call, also supports vaginal delivery.  Patient found sitting up in bed. She reports feeling well but admits to being very nervous. She reports occasional contractions and good fetal movement. She denies vaginal bleeding. Given PPROM, discussed plan for delivery which patient agreed to. Informed patient that vaginal delivery is not a contraindications. Patient is aware that she is not a candidate for epidural anesthesia. All questions were answered. Patient will be transferred to Labor and delivery

## 2020-08-30 NOTE — Progress Notes (Signed)
PT Cancellation Note  Patient Details Name: Ranya Fiddler MRN: [id number] DOB: 1996/02/27   Cancelled Treatment:    Reason Eval/Treat Not Completed: Medical issues which prohibited therapy Pt transferred to labor and delivery overnight and scheduled for delivery of baby. Will follow up as pt medically appropriate.   Reuel Derby, PT, DPT  Acute Rehabilitation Services  Pager: [phone] Office: [phone]    Rudean Hitt 08/30/2020, 9:07 AM

## 2020-08-30 NOTE — Anesthesia Postprocedure Evaluation (Signed)
Anesthesia Post Note  Patient: Colleen Valentine  Procedure(s) Performed: CESAREAN SECTION (N/A )     Patient location during evaluation: PACU Anesthesia Type: General Level of consciousness: awake and alert Pain management: pain level controlled Vital Signs Assessment: post-procedure vital signs reviewed and stable Respiratory status: spontaneous breathing, nonlabored ventilation, respiratory function stable and patient connected to nasal cannula oxygen Cardiovascular status: blood pressure returned to baseline and stable Postop Assessment: no apparent nausea or vomiting Anesthetic complications: no   No complications documented.  Last Vitals:  Vitals:   08/30/20 1551 08/30/20 1556  BP:    Pulse:    Resp:    Temp:    SpO2: 97% 97%    Last Pain:  Vitals:   08/30/20 1518  TempSrc:   PainSc: 10-Worst pain ever   Pain Goal: Patients Stated Pain Goal: 5 (08/30/20 1315)                 Barnet Glasgow

## 2020-08-30 NOTE — Discharge Summary (Signed)
Postpartum Discharge Summary      Patient Name: Colleen Valentine DOB: 1995-05-29 MRN: [id number]  Date of admission: 08/21/2020 Delivery date:08/30/2020  Delivering provider: Laurey Arrow [place]  Date of discharge: 09/03/2020  Admitting diagnosis: Spinal cord compression ([place]) [G95.20] Intrauterine pregnancy: [redacted]w[redacted]d    Secondary diagnosis:  Active Problems:   Morbid obesity with BMI of 40.0-44.9, adult ([place]   Gestational diabetes mellitus in third trimester   Spinal cord compression ([facility])   [redacted] weeks gestation of pregnancy   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[place]   Epidural abscess   Vertebral osteomyelitis ([place]   MRSA bacteremia      Discharge diagnosis: Preterm Pregnancy Delivered, GDM A1 and Epidural abscess                                              Augmentation: N/A Complications: None  Hospital course: Onset of Labor With Unplanned C/S   25y.o. yo [id number] admitted on 08/21/2020 for back pain and weakness. Please see prior notes for details. She was found to have an epidural abscess spanning from T10-L3. She underwent Laminotomies with decompression of epidural abscess with neurosurgery on 3/30 and continued IV antibitoics. Her postoperative course was overall uneventful   On 4/6 (POD#6), patient PPROM'd and was also found to be breech. The patient went for cesarean section due to Malpresentation. Delivery details as follows: Membrane Rupture Time/Date: 11:35 PM ,08/29/2020   Delivery Method:C-Section, Low Transverse  Details of operation can be found in separate operative note. Patient had an uncomplicated postpartum course.  She is ambulating,tolerating a regular diet, passing flatus, and urinating well.  Patient is discharged home in stable condition 09/03/20.  Newborn Data: Birth date:08/30/2020  Birth time:11:22 AM  Gender:Female  Living status:Living  Apgars:5 ,7  Weight:2930 g    Magnesium Sulfate received: No BMZ received: Yes Rhophylac:N/A   Physical exam   Vitals:   09/03/20 0000 09/03/20 0427 09/03/20 0736 09/03/20 1152  BP: 108/66 (!) 107/54 (!) 119/57 118/62  Pulse: 90 85 96 97  Resp: _0 Temp: 97.9 F (36.6 C) 97.8 F (36.6 C) 97.6 F (36.4 C) 98 F (36.7 C)  TempSrc: Oral Oral Oral Oral  SpO2: 100% 98% 96% 96%  Weight:      Height:       General: alert, cooperative and no distress Lochia: appropriate Uterine Fundus: firm Incision: Healing well with no significant drainage, Dressing is clean, dry, and intact DVT Evaluation: No evidence of DVT seen on physical exam. Labs: Lab Results  Component Value Date   WBC 9.2 08/31/2020   HGB 7.2 (L) 08/31/2020   HCT 22.2 (L) 08/31/2020   MCV 91.0 08/31/2020   PLT 256 08/31/2020   CMP Latest Ref Rng & Units 08/26/2020  Glucose 70 - 99 mg/dL -  BUN 6 - 20 mg/dL -  Creatinine 0.44 - 1.00 mg/dL 0.52  Sodium 135 - 145 mmol/L -  Potassium 3.5 - 5.1 mmol/L -  Chloride 98 - 111 mmol/L -  CO2 22 - 32 mmol/L -  Calcium 8.9 - 10.3 mg/dL -  Total Protein 6.5 - 8.1 g/dL -  Total Bilirubin 0.3 - 1.2 mg/dL -  Alkaline Phos 38 - 126 U/L -  AST 15 - 41 U/L -  ALT 0 - 44 U/L -   Edinburgh Score:  Edinburgh Postnatal Depression Scale Screening Tool 09/03/2020  I have been able to laugh and see the funny side of things. 1  I have looked forward with enjoyment to things. 1  I have blamed myself unnecessarily when things went wrong. 3  I have been anxious or worried for no good reason. 3  I have felt scared or panicky for no good reason. 3  Things have been getting on top of me. 3  I have been so unhappy that I have had difficulty sleeping. 3  I have felt sad or miserable. 3  I have been so unhappy that I have been crying. 3  The thought of harming myself has occurred to me. 0  Edinburgh Postnatal Depression Scale Total 23     After visit meds:  Allergies as of 09/03/2020   No Known Allergies     Medication List    STOP taking these medications   cephALEXin 500 MG  capsule Commonly known as: KEFLEX   cyclobenzaprine 10 MG tablet Commonly known as: FLEXERIL   traMADol 50 MG tablet Commonly known as: ULTRAM     TAKE these medications   acetaminophen 500 MG tablet Commonly known as: TYLENOL Take 1,000 mg by mouth every 6 (six) hours as needed for mild pain or headache.   cetirizine 10 MG tablet Commonly known as: ZYRTEC Take 1 tablet (10 mg total) by mouth daily.   daptomycin  IVPB Commonly known as: CUBICIN Inject 900 mg into the vein daily. Indication:  Epidural abscess First Dose: No Last Day of Therapy:  10/18/2020 Labs - Once weekly:  CBC/D, BMP, and CPK Labs - Every other week:  ESR and CRP Method of administration: IV Push Method of administration may be changed at the discretion of home infusion pharmacist based upon assessment of the patient and/or caregiver's ability to self-administer the medication ordered.   enoxaparin 60 MG/0.6ML injection Commonly known as: LOVENOX Inject 1 syringe (24m total) into the skin daily. (Inject 0.6 mLs (60 mg total) into the skin daily.)   hydrOXYzine 25 MG capsule Commonly known as: VISTARIL Take 1 capsule (25 mg total) by mouth daily as needed for anxiety.   lamoTRIgine 25 MG tablet Commonly known as: LAMICTAL Take 25 mg by mouth 2 (two) times daily.   omeprazole 20 MG capsule Commonly known as: PRILOSEC Take 20 mg by mouth daily.   oxyCODONE 5 MG immediate release tablet Commonly known as: Oxy IR/ROXICODONE Take 1-2 tablets (5-10 mg total) by mouth every 6 (six) hours as needed for up to 7 days for moderate pain or severe pain.   Prenatal 27-1 MG Tabs Take 1 tablet by mouth daily at 12 noon.   Senexon-S 8.6-50 MG tablet Generic drug: senna-docusate Take 2 tablets by mouth daily for 30 doses.   valACYclovir 500 MG tablet Commonly known as: VALTREX Take 500 mg by mouth daily.            Durable Medical Equipment  (From admission, onward)         Start     Ordered    08/24/20 1438  For home use only DME Shower stool  Once        08/24/20 1438   08/23/20 1426  For home use only DME 3 n 1  Once        08/23/20 1426   08/23/20 1425  For home use only DME Walker rolling  Once       Question Answer Comment  Walker: With  [place]   Patient needs a walker to treat with the following condition Gait difficulty      08/23/20 1426           Discharge Care Instructions  (From admission, onward)         Start     Ordered   08/31/20 0000  Change dressing on IV access line weekly and PRN  (Home infusion instructions - [facility] )        08/31/20 1434           Discharge home in stable condition Infant Feeding: Breast Infant Disposition:home with mother Discharge instruction: per After Visit Summary and Postpartum booklet. Activity: Advance as tolerated. Pelvic rest for 6 weeks.  Diet: routine diet Future Appointments: Future Appointments  Date Time Provider [place]  09/04/2020 12:10 PM Janyth Pupa, DO [facility]  09/15/2020 11:15 AM Mignon Pine, DO [facility]  10/04/2020  1:30 PM Roma Schanz, CNM [facility]   Follow up Visit:  Follow-up Information    Tommy Medal, Lavell Islam, MD .   Specialty: Infectious Diseases Contact information: [address] [place] [address] [phone]                Please schedule this patient for a In person postpartum visit in 4 weeks with the following provider: MD only Additional Postpartum F/U:2 hour GTT at six weeks, Incision check 1 week and follow up epidural abscess  High risk pregnancy complicated by: T4HDQ, MRSA epidural abscess requiring surgery, IV antibiotics Delivery mode:  C-Section, Low Transverse  Anticipated Birth Control:  Condoms and Unsure   09/03/2020 Mora Bellman, MD

## 2020-08-30 NOTE — Care Management (Signed)
CM spoke to Dr. Nelda Marseille and Jeannene Patella with [facility] regarding discharge plans.  Patient plan is still for IV PICC on IV antx till 5/25 when patient is ready for discharge.  Pam informed CM that nursing is contracted with [facility] ph# [phone].  [name] RNC-MNN, BSN Transitions of Care Pediatrics/Women's and [place]

## 2020-08-31 LAB — CBC
HCT: 22.2 % — ABNORMAL LOW (ref 36.0–46.0)
Hemoglobin: 7.2 g/dL — ABNORMAL LOW (ref 12.0–15.0)
MCH: 29.5 pg (ref 26.0–34.0)
MCHC: 32.4 g/dL (ref 30.0–36.0)
MCV: 91 fL (ref 80.0–100.0)
Platelets: 256 10*3/uL (ref 150–400)
RBC: 2.44 MIL/uL — ABNORMAL LOW (ref 3.87–5.11)
RDW: 17.2 % — ABNORMAL HIGH (ref 11.5–15.5)
WBC: 9.2 10*3/uL (ref 4.0–10.5)
nRBC: 0 % (ref 0.0–0.2)

## 2020-08-31 MED ORDER — GABAPENTIN 300 MG PO CAPS
300.0000 mg | ORAL_CAPSULE | Freq: Three times a day (TID) | ORAL | Status: DC
  Administered 2020-08-31 – 2020-09-03 (×10): 300 mg via ORAL
  Filled 2020-08-31 (×7): qty 1
  Filled 2020-08-31: qty 3
  Filled 2020-08-31 (×2): qty 1

## 2020-08-31 MED ORDER — DAPTOMYCIN IV (FOR PTA / DISCHARGE USE ONLY): 900.0000 mg | INTRAVENOUS | 0 refills | Status: DC

## 2020-08-31 MED ORDER — HYDROMORPHONE HCL 1 MG/ML IJ SOLN
1.0000 mg | Freq: Once | INTRAMUSCULAR | Status: AC
  Administered 2020-08-31: 1 mg via INTRAVENOUS
  Filled 2020-08-31: qty 1

## 2020-08-31 NOTE — Clinical Social Work Maternal (Signed)
CLINICAL SOCIAL WORK MATERNAL/CHILD NOTE  Patient Details  Name: Colleen Valentine MRN: [id number] Date of Birth: 12/09/1995  Date:  08/31/2020  Clinical Social Worker Initiating Note:  Laurey Arrow Date/Time: Initiated:  08/31/20/1402     Child's Name:  Colleen Valentine   Biological Parents:  Mother ([name] declined to provide any information about FOB.)   Need for Interpreter:  None   Reason for Referral:  [place] Substance Use/Substance Use During Pregnancy    Address:  [place] [address]  [name] reported her temporary address will be [address], [place], [place] [address]   Phone number:  [phone] (home)     Additional phone number:   Household Members/Support Persons (HM/SP):   Household Member/Support Person 1   HM/SP Name Relationship DOB or Age  HM/SP -[place] son 03/02/2019  HM/SP -2        HM/SP -3        HM/SP -4        HM/SP -5        HM/SP -6        HM/SP -7        HM/SP -8          Natural Supports (not living in the home):  Spouse/significant other ([name] report her fiance, Francetta Found is her primary support person.)   Professional Supports: None   Employment: Unemployed   Type of Work:     Education:  [place] arranged:    Museum/gallery curator Resources:  [employer]   Other Resources:  Physicist, medical ,[place] Considerations Which May Impact Care:  Per [employer], [name] is Colleen Valentine.  Strengths:  Ability to meet basic needs ,Pediatrician chosen,Compliance with medical plan ,Home prepared for child ,Understanding of illness,Psychotropic Medications   Psychotropic Medications:  Lamictal      Pediatrician:    [place]  Pediatrician List:   [facility] for [place]      Pediatrician Fax Number:    Risk Factors/Current Problems:  Mental Health  Concerns ,Substance Use    Cognitive State:  Able to Concentrate ,Alert ,Linear Thinking    Mood/Affect:  Comfortable ,Interested ,Happy ,Relaxed    CSW Assessment: CSW met with [name] in [name]'s first floor room/109 to offer support and complete assessment due to [place] hx and marijuana use. When CSW arrived, [name] was in the rest room.  CSW offered to return at a later time and [name] declined. [name] was easy to engage, forthcoming, and receptive to meeting with CSW. [name] states that she and baby are doing well and [name] is looking forward to being discharged in the near future. [name] communicated feeling prepared for discharge and stated she has all essential items to cares for infant post discharge.  [name] is aware of SIDS precautions and responded appropriately to CSW's questions.  [name] admitted to having a MH hx of anxiety, PTSD, and Bipolar disorder. Per [name], [name] was dx with all three disorders around 25 years of age.  [name] stated that around 73 years of ages [name] was reassessed and was not dx with bipolar. [name] shared she is currently taking Lamictal and her symptoms are "Managed well." CSW provided education regarding the baby blues period vs. perinatal mood disorders, discussed treatment and gave resources for mental health follow up if  concerns arise.  CSW recommends self-evaluation during the postpartum time period using the New Mom Checklist from Postpartum Progress and encouraged [name] to contact a medical professional if symptoms are noted at any time. [name] presented with insight and awareness and did not display any acute MH symptoms. [name] acknowledged having PMAD symptoms after the birth of her son in 2020 and reported her symptoms last for about a year.  Per [name], she experienced bad dreams nightly, separation anxiety, and paranoia. [name] communicated that she reached out for help and attended virtual counseling ([name] could not recall agency name).  [name] expressed feeling comfortable seeking help again if needed.  CSW  assessed for safety and [name] denied SI, HI, and DV.  CSW inquired about [name]'s marijuana use. [name] stated that she used marijuana throughout her pregnancy to help decrease her nausea. [name] denied the use of all other illicit substances and she declined resources for SA counseling. CSW explained hospital's drug screen policy and [name] was understanding. CSW will monitor infant's UDS and CDS and will make a report to [place] if warranted. [name] acknowledged CPS hx August 2021 and per [name], her case is currently close. CSW confirmed that [name]'s CPS case is currently closed.    There are no barriers to infant's discharge.   CSW Plan/Description:  No Further Intervention Required/No Barriers to Discharge,Sudden Infant Death Syndrome (SIDS) Education,Perinatal Mood and Anxiety Disorder (PMADs) Education,Other Patient/Family Education,Hospital Drug Screen Policy Information,Other Information/Referral to [employer] [place] [place] [employer] Will Continue to Monitor Umbilical Cord Tissue Drug Screen Results and Make Report if Warranted   Laurey Arrow, MSW, LCSW Clinical Social Work [phone]  Dimple Nanas, LCSW 08/31/2020, 2:05 PM

## 2020-08-31 NOTE — Lactation Note (Signed)
This note was copied from a baby's chart. Lactation Consultation Note Took mom [facility] information paper to sign to send for referral. Gave mom shells w/wrap to hold shells on. Mom holding baby STS post bath.  Patient Name: Colleen Valentine [id number] Date: 08/31/2020 Reason for consult: Follow-up assessment;Late-preterm 34-36.6wks Age:23 hours  Maternal Data Has patient been taught Hand Expression?: Yes Does the patient have breastfeeding experience prior to this delivery?: Yes How long did the patient breastfeed?: BF 2 days, pumped 1 month  Feeding Mother's Current Feeding Choice: Breast Milk and Formula Nipple Type: Slow - flow  LATCH Score Latch: Too sleepy or reluctant, no latch achieved, no sucking elicited.  Audible Swallowing: None  Type of Nipple: Inverted  Comfort (Breast/Nipple): Soft / non-tender  Hold (Positioning): Full assist, staff holds infant at breast  LATCH Score: 2   Lactation Tools Discussed/Used Tools: Shells Nipple shield size: 20 Breast pump type: Double-Electric Breast Pump Pump Education: Setup, frequency, and cleaning;Milk Storage Reason for Pumping: inverted/flat nipples/LPI Pumping frequency: Q 3hr Pumped volume: 0 mL  Interventions Interventions: Breast feeding basics reviewed;Support pillows;Assisted with latch;Position options;Skin to skin;Expressed milk;Breast massage;Hand express;Pre-pump if needed;DEBP;Breast compression;Adjust position  Discharge Pump: DEBP WIC Program: Yes  Consult Status Consult Status: Follow-up Date: 08/31/20 Follow-up type: In-patient    Theodoro Kalata 08/31/2020, 12:24 AM

## 2020-08-31 NOTE — Progress Notes (Signed)
POSTPARTUM PROGRESS NOTE  POD #1  Subjective:  Colleen Valentine is a 25 y.o. [id number] s/p pLTCS at [redacted]w[redacted]d for breech presentation. Today she notes that she does have some pain- both in her back and lower abdomen. Pt has been up with walker.  Foley removed, voiding x1 on her own.  Tolerating po intake. Denies nausea or vomiting. She has + flatus, + BM.  Lochia minimal Denies fever/chills/chest pain/SOB.  no HA, no blurry vision, no RUQ pain  Objective: Blood pressure (!) 111/52, pulse 79, temperature 97.9 F (36.6 C), temperature source Oral, resp. rate 16, height 5\' 5"  (1.651 m), weight 105.4 kg, last menstrual period 12/14/2019, SpO2 99 %, unknown if currently breastfeeding.  Physical Exam:  General: alert, cooperative and no distress Chest: no respiratory distress Heart: regular rate and rhythm Abdomen: soft, nontender, +BS Uterine Fundus: firm, appropriately tender Incision: provena in place Back: bandage removed- incision healing appropriately, minimal erythema DVT Evaluation: No calf swelling or tenderness Extremities: no edema Skin: warm, dry  Results for orders placed or performed during the hospital encounter of 08/21/20 (from the past 24 hour(s))  CBC     Status: Abnormal   Collection Time: 08/31/20  5:05 AM  Result Value Ref Range   WBC 9.2 4.0 - 10.5 K/uL   RBC 2.44 (L) 3.87 - 5.11 MIL/uL   Hemoglobin 7.2 (L) 12.0 - 15.0 g/dL   HCT 22.2 (L) 36.0 - 46.0 %   MCV 91.0 80.0 - 100.0 fL   MCH 29.5 26.0 - 34.0 pg   MCHC 32.4 30.0 - 36.0 g/dL   RDW 17.2 (H) 11.5 - 15.5 %   Platelets 256 150 - 400 K/uL   nRBC 0.0 0.0 - 0.2 %    Assessment/Plan: Colleen Valentine is a 25 y.o. [id number] s/p pLTCS at [redacted]w[redacted]d POD#1 complicated by: 1) Epidural abscess s/p laminotomies on 3/30 -Currently on IV Daptomycin -PICC in place- plan for IV antibiotics x 6wks -ID consulted -management per neurosurgery  2) postop care -s/p PCA, plan for oral oxycodone/tylenol/ibuprofen as needed -advance diet  as tolerated -PT on board -Anxiety- continue Lamictal 25mg  bid -Lovenox for DVT prophylaxis  Contraception: female partner Feeding: breast  Dispo: continue with postop care as outlined as above.  Due to PICC and recent surgery- may consider continuing Lovenox in immediate postpartum time frame   LOS: 10 days   Janyth Pupa, DO Faculty Attending, Center for [facility] 08/31/2020, 11:28 AM

## 2020-08-31 NOTE — Care Management Note (Addendum)
Case Management Note  Patient Details  Name: Colleen Valentine MRN: [id number] Date of Birth: [birth year]  Subjective/Objective:                  35 3/7 wks PPROM and delivered by Csection. POD#6 s/p Laminotomies for epidural abscess.  Action/Plan:  IV antiboitics through PICC with [facility] nursing until 10/18/20 Expected Discharge Date:09/04/20                  Expected Discharge Plan:   IV PICC antibiotic at home In-House Referral:   chaplain, lactation, CSW  Discharge planning Services  CM Consult     DME Arranged:  3-N-1,Walker rolling,Shower stool DME Agency:  [facility]  HH Arranged:  RN [place] Agency:  [facility]-contracted [place] care for nursing. [facility] will provide IV medication ( pharmacy)  Phone- Pam - with [facility]-[phone] Please call for any questions or concerns  CM spoke to [facility] this am and [facility] can provide RN on Monday 09/04/20 in the home for start of care.  If patient is medically ready patient can discharge on Sunday and [facility] will start RN in the home Monday 09/04/20 for antibiotic therapy through PICC and PICC line care. Pam with [facility] Infusion co[facility] will begin teaching in the hospital in the patient's room Friday 09/01/20. Pam- cell # [phone].   Mom's address she will be going to at discharge: [address], [place], [place] [address].  CM spoke to [facility], Dr. Nelda Marseille and patient and discussed plan at this time.   [name] RNC-MNN, BSN Transitions of Care- [phone] Pediatrics/Women's and [facility]    [name], [place] 08/31/2020, 8:02 AM

## 2020-08-31 NOTE — Progress Notes (Signed)
Pt OOB and ambulated to bedside commode. Pt voiding without difficulty. Dressing change for back wound completed. Pt in good spirits and feeling positive about recovery process. PCA discontinued. Toya Smothers, RN

## 2020-08-31 NOTE — Progress Notes (Signed)
Physical Therapy Treatment Patient Details Name: Erial Fikes MRN: [id number] DOB: [birth year] Today's Date: 08/31/2020    History of Present Illness 25 y/o female [redacted] weeks gestation who presents with T10-L3 epidural abscess on 08/22/2020. She is now s/p T11-L1 laminotomies with decompression of epidural abscess. Pt underwent C-section on 4/6 with placement of wound vac,    PMH significant for suicide attempt, seizures, polysubstance abuse, herpes, acute pyelonephritis.    PT Comments    Pt progressing slowly towards goals following c-section. Increased pain this session and required increased time for all mobility tasks. Educated about importance of mobility given her back surgery and c-section. Educated about using pillow to brace when coughing/sneezing, etc. Anticipate pt will progress well once pain controlled. Will continue to follow acutely.     Follow Up Recommendations  No PT follow up;Supervision for mobility/OOB     Equipment Recommendations  Rolling walker with 5" wheels;Other (comment);3in1 (PT) (shower chair)    Recommendations for Other Services       Precautions / Restrictions Precautions Precautions: Fall;Back Precaution Comments: wound vac Required Braces or Orthoses:  (No brace needed per order) Restrictions Weight Bearing Restrictions: No    Mobility  Bed Mobility Overal bed mobility: Needs Assistance Bed Mobility: Supine to Sit     Supine to sit: Supervision     General bed mobility comments: Using helicopter technique with HOB elevated. Able to maintain precautions.    Transfers Overall transfer level: Needs assistance Equipment used: Rolling walker (2 wheeled) Transfers: Sit to/from Stand Sit to Stand: Min guard         General transfer comment: Min guard for safety. Extended time required to stand secondary to increased incisional site pain.  Ambulation/Gait Ambulation/Gait assistance: Min guard Gait Distance (Feet): 15 Feet Assistive  device: Rolling walker (2 wheeled) Gait Pattern/deviations: Step-through pattern;Decreased stride length;Wide base of support Gait velocity: Decreased   General Gait Details: Extremely slow, guarded gait. Cues for upright posture. Mobility limited to within the room secondary to pain.   Stairs             Wheelchair Mobility    Modified Rankin (Stroke Patients Only)       Balance Overall balance assessment: Needs assistance Sitting-balance support: No upper extremity supported;Feet supported Sitting balance-Leahy Scale: Fair     Standing balance support: No upper extremity supported;During functional activity Standing balance-Leahy Scale: Fair Standing balance comment: Can maintain static standing without UE support during toileting task                            Cognition Arousal/Alertness: Awake/alert Behavior During Therapy: WFL for tasks assessed/performed Overall Cognitive Status: Within Functional Limits for tasks assessed                                        Exercises      General Comments General comments (skin integrity, edema, etc.): Educated about using pillow to brace abdomen when coughing/sneezing, etc.      Pertinent Vitals/Pain Pain Assessment: Faces Faces Pain Scale: Hurts whole lot Pain Location: Back and abdomen Pain Descriptors / Indicators: Operative site guarding;Dull;Stabbing;Throbbing Pain Intervention(s): Limited activity within patient's tolerance;Monitored during session;Repositioned    Home Living                      Prior Function  PT Goals (current goals can now be found in the care plan section) Acute Rehab PT Goals Patient Stated Goal: Decrease pain, be safe at home PT Goal Formulation: With patient Time For Goal Achievement: 09/06/20 Potential to Achieve Goals: Good Progress towards PT goals: Progressing toward goals    Frequency    Min 5X/week      PT Plan  Current plan remains appropriate    Co-evaluation              AM-PAC PT "6 Clicks" Mobility   Outcome Measure  Help needed turning from your back to your side while in a flat bed without using bedrails?: None Help needed moving from lying on your back to sitting on the side of a flat bed without using bedrails?: A Little Help needed moving to and from a bed to a chair (including a wheelchair)?: A Little Help needed standing up from a chair using your arms (e.g., wheelchair or bedside chair)?: A Little Help needed to walk in hospital room?: A Little Help needed climbing 3-5 steps with a railing? : A Lot 6 Click Score: 18    End of Session   Activity Tolerance: Patient limited by pain Patient left: with call bell/phone within reach;in chair (with baby in bassinet in front of pt) Nurse Communication: Mobility status PT Visit Diagnosis: Unsteadiness on feet (R26.81);Pain Pain - part of body:  (back, abdomen)     Time: 4818-5631 PT Time Calculation (min) (ACUTE ONLY): 39 min  Charges:  $Gait Training: 8-22 mins $Therapeutic Activity: 23-37 mins                     [name], DPT  Acute Rehabilitation Services  Pager: [phone] Office: [phone]    Rudean Hitt 08/31/2020, 4:55 PM

## 2020-08-31 NOTE — Progress Notes (Addendum)
Subjective: Patient reports "I'm doing ok...having some trouble with breastfeeding, but they're working with me"  "My back hurts, that's all"  Objective: Vital signs in last 24 hours: Temp:  [97.9 F (36.6 C)-98.9 F (37.2 C)] 98.3 F (36.8 C) (04/07 1131) Pulse Rate:  [79-114] 86 (04/07 1131) Resp:  [14-18] 18 (04/07 1131) BP: (108-117)/(47-71) 112/56 (04/07 1131) SpO2:  [94 %-99 %] 97 % (04/07 1131)  Intake/Output from previous day: 04/06 0701 - 04/07 0700 In: 4152 [P.O.:918; I.V.:3234] Out: 2321 [Urine:1725; Blood:596] Intake/Output this shift: No intake/output data recorded.  Alert, conversant. In bed, eating supper and feeding baby with bottle. MAEW. She reports back pain only. Dressing is intact, clean, and dry. (Did not take down dressing this visit - will assess in am.)    Lab Results: Recent Labs    08/31/20 0505  WBC 9.2  HGB 7.2*  HCT 22.2*  PLT 256   BMET No results for input(s): NA, K, CL, CO2, GLUCOSE, BUN, CREATININE, CALCIUM in the last 72 hours.  Studies/Results: No results found.  Assessment/Plan: stable  LOS: 10 days  IVAB per Infectious Disease via PICC.  Reassured pt re: expected back pain, but need to mobilize. Continue PT.  Continue dressing changes; ok to shower and reapply new dry dressing. Betadine to site as long as the wound continues to drain.  Pain meds per OB/GYN. T11-12 hemilaminectomy 08/23/20, ok for Lovenox.  Will assess wound tomorrow and advise further for home care. Office follow up with Dr. Ellene Route 1 month post-op.     Verdis Prime 08/31/2020, 3:30 PM   Patient is improving.  To work with PT.  Continue dressing changes top back and IV ABX per ID service.

## 2020-08-31 NOTE — Lactation Note (Signed)
This note was copied from a baby's chart. Lactation Consultation Note  Patient Name: Colleen Valentine IRJJO'A Date: 08/31/2020 Reason for consult: Follow-up assessment;Late-preterm 34-36.6wks;1st time breastfeeding;Primapara;Nipple pain/trauma;Infant weight loss;Other (Comment) (4 % weight loss) Age:23 hours P 1 ,  As LC entered the room, mom holding baby and baby was fussing.  LC offered to assist and check the diaper and it was wet.  Baby rooting and LC offered to assist to latch. Baby latched for 5 mins with swallows and released. Per mom mentioned her nipples are sore. Already has shells , encouraged to wear. Mom also mentioned when she is pumping sore.  LC recommended for her to use her shells and LC would ask the RN to obtain coconut oil and use a dab prior to pumping to decrease the dry against dry.   Maternal Data Has patient been taught Hand Expression?: Yes (noted several drops with review of hand expressing)  Feeding Mother's Current Feeding Choice: Breast Milk and Formula  LATCH Score Latch: Grasps breast easily, tongue down, lips flanged, rhythmical sucking.  Audible Swallowing: Spontaneous and intermittent  Type of Nipple: Everted at rest and after stimulation  Comfort (Breast/Nipple): Soft / non-tender  Hold (Positioning): Assistance needed to correctly position infant at breast and maintain latch.  LATCH Score: 9   Lactation Tools Discussed/Used Tools: Shells;Pump Breast pump type: Double-Electric Breast Pump  Interventions Interventions: Breast feeding basics reviewed;Assisted with latch;Breast massage;Hand express;Reverse pressure;Breast compression;Adjust position;Support pillows;Position options  Discharge    Consult Status Consult Status: Follow-up Date: 09/01/20 Follow-up type: In-patient    [place] 08/31/2020, 6:39 PM

## 2020-09-01 ENCOUNTER — Other Ambulatory Visit (HOSPITAL_COMMUNITY): Payer: Self-pay

## 2020-09-01 LAB — SURGICAL PATHOLOGY

## 2020-09-01 MED ORDER — OXYCODONE HCL 5 MG PO TABS
5.0000 mg | ORAL_TABLET | Freq: Four times a day (QID) | ORAL | 0 refills | Status: AC | PRN
  Filled 2020-09-01: qty 30, 4d supply, fill #0

## 2020-09-01 MED ORDER — ENOXAPARIN SODIUM 60 MG/0.6ML ~~LOC~~ SOLN
60.0000 mg | SUBCUTANEOUS | 0 refills | Status: DC
  Filled 2020-09-01: qty 25.2, 42d supply, fill #0

## 2020-09-01 MED ORDER — SENNOSIDES-DOCUSATE SODIUM 8.6-50 MG PO TABS
2.0000 | ORAL_TABLET | ORAL | 0 refills | Status: AC
  Filled 2020-09-01: qty 60, 30d supply, fill #0

## 2020-09-01 MED ORDER — IBUPROFEN 600 MG PO TABS: 600.0000 mg | ORAL_TABLET | Freq: Four times a day (QID) | ORAL | Status: DC | PRN

## 2020-09-01 MED ORDER — IBUPROFEN 600 MG PO TABS
600.0000 mg | ORAL_TABLET | Freq: Four times a day (QID) | ORAL | Status: DC | PRN
  Administered 2020-09-01: 600 mg via ORAL
  Filled 2020-09-01: qty 1

## 2020-09-01 MED ORDER — ONDANSETRON 4 MG PO TBDP
8.0000 mg | ORAL_TABLET | Freq: Three times a day (TID) | ORAL | Status: DC | PRN
  Administered 2020-09-01: 8 mg via ORAL
  Filled 2020-09-01: qty 2

## 2020-09-01 MED ORDER — PRENATAL MULTIVITAMIN CH
1.0000 | ORAL_TABLET | Freq: Every day | ORAL | 3 refills | Status: DC
  Filled 2020-09-01: qty 30, 30d supply, fill #0

## 2020-09-01 NOTE — Evaluation (Signed)
Occupational Therapy Evaluation Patient Details Name: Colleen Valentine MRN: [id number] DOB: Sep 30, 1995 Today's Date: 09/01/2020    History of Present Illness 25 y/o female [redacted] weeks gestation who presents with T10-L3 epidural abscess on 08/22/2020. She is now s/p T11-L1 laminotomies with decompression of epidural abscess. Pt underwent C-section on 4/6 with placement of wound vac,    PMH significant for suicide attempt, seizures, polysubstance abuse, herpes, acute pyelonephritis.   Clinical Impression   PTA, pt was living with her significant other and was independent; recent OT eval and treat in which education on back precautions and compensatory techniques was provided. Also, distributed AE for LB ADLs and toileting. Reviewed education on back precautions, bed mobility, LB ADLs with AE, toileting with toilet aide, and shower transfer with seat; pt verbalized understanding.Also providing education for wound vac management during ADLs. Pt declined to perform OOB activity at this time but reports she feels very comfortable with OOB to bathroom and use of AE. Answered all pt questions. Recommend dc home once medically stable per physician. All acute OT needs met and will sign off. Thank you.    Follow Up Recommendations  No OT follow up    Equipment Recommendations  None recommended by OT    Recommendations for Other Services PT consult     Precautions / Restrictions Precautions Precautions: Fall;Back Precaution Booklet Issued: Yes (comment) Precaution Comments: wound vac Required Braces or Orthoses:  (No brace needed per order)      Mobility Bed Mobility Overal bed mobility: Needs Assistance             General bed mobility comments: Reviewing log roll technique and benefits for pain management    Transfers                 General transfer comment: Pt declined OOB activity    Balance                                           ADL either performed or  assessed with clinical judgement   ADL Overall ADL's : Needs assistance/impaired                                       General ADL Comments: Reviewed education on back precautions as well as compensatory techniques for ADLs and functional transfers. Pt verbalized understanding. Pt declined to perform OOB activity at this time as she just retruned to bed from walking around the room with her significant other. Pt reports she is feeling more comfortable with mobility. Also educationing pt on management of wound vac     Vision Baseline Vision/History: Wears glasses Patient Visual Report: No change from baseline       Perception     Praxis      Pertinent Vitals/Pain Pain Assessment: Faces Faces Pain Scale: Hurts little more Pain Location: Back and abdomen Pain Descriptors / Indicators: Operative site guarding;Dull;Stabbing;Throbbing Pain Intervention(s): Monitored during session;Limited activity within patient's tolerance;Repositioned     Hand Dominance Right   Extremity/Trunk Assessment Upper Extremity Assessment Upper Extremity Assessment: Overall WFL for tasks assessed   Lower Extremity Assessment Lower Extremity Assessment: Defer to PT evaluation   Cervical / Trunk Assessment Cervical / Trunk Assessment: Other exceptions Cervical / Trunk Exceptions: s/p lami surgery and c-section   Communication Communication  Communication: No difficulties   Cognition Arousal/Alertness: Awake/alert Behavior During Therapy: WFL for tasks assessed/performed Overall Cognitive Status: Within Functional Limits for tasks assessed                                     General Comments  Aunt and girlfriend present during evaluation    Exercises     Shoulder Instructions      Home Living Family/patient expects to be discharged to:: Private residence Living Arrangements: Spouse/significant other;Children (66 yo; "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") Available Help at  Discharge: Family;Available PRN/intermittently Type of Home: Mobile home Home Access: Stairs to enter Entrance Stairs-Number of Steps: 7 Entrance Stairs-Rails: Right;Left Home Layout: One level     Bathroom Shower/Tub: Tub/shower unit;Walk-in shower   Bathroom Toilet: Standard     Home Equipment: Environmental consultant - 2 wheels;Toilet riser;Hand held shower head;Bedside commode;Shower seat          Prior Functioning/Environment Level of Independence: Independent                 OT Problem List: Decreased range of motion;Impaired balance (sitting and/or standing);Decreased knowledge of use of DME or AE;Decreased knowledge of precautions;Pain      OT Treatment/Interventions: Self-care/ADL training;Therapeutic exercise;DME and/or AE instruction;Energy conservation;Therapeutic activities;Patient/family education    OT Goals(Current goals can be found in the care plan section) Acute Rehab OT Goals Patient Stated Goal: Decrease pain, be safe at home OT Goal Formulation: All assessment and education complete, DC therapy  OT Frequency: Min 2X/week   Barriers to D/C:            Co-evaluation              AM-PAC OT "6 Clicks" Daily Activity     Outcome Measure Help from another person eating meals?: None Help from another person taking care of personal grooming?: A Little Help from another person toileting, which includes using toliet, bedpan, or urinal?: A Little Help from another person bathing (including washing, rinsing, drying)?: A Little Help from another person to put on and taking off regular upper body clothing?: A Little Help from another person to put on and taking off regular lower body clothing?: A Little 6 Click Score: 19   End of Session Equipment Utilized During Treatment: Rolling walker Nurse Communication: Mobility status  Activity Tolerance: Patient tolerated treatment well Patient left: in bed;with call bell/phone within reach;with family/visitor  present  OT Visit Diagnosis: Unsteadiness on feet (R26.81);Other abnormalities of gait and mobility (R26.89);Muscle weakness (generalized) (M62.81);Pain Pain - part of body:  (Back)                Time: 1216-2446 OT Time Calculation (min): 17 min Charges:  OT General Charges $OT Visit: 1 Visit OT Evaluation $OT Eval Low Complexity: [place], OTR/L Acute Rehab Pager: [phone] Office: [place] 09/01/2020, 11:23 AM

## 2020-09-01 NOTE — Progress Notes (Signed)
POSTPARTUM PROGRESS NOTE  POD #2  Subjective:  Colleen Valentine is a 25 y.o. F3V4451 s/p primary LTCS at [redacted]w[redacted]d.  She reports she doing well. No acute events overnight.  She denies any problems with ambulating, voiding or po intake. Denies nausea or vomiting. She has  passed flatus. Pain is well controlled.  Lochia is minimal.  Pt is ambulating with a walker.  Pt denies bowel movement.  Per pt, back catheter has been removed.  Pt notes mild burning at incision site  Objective: Blood pressure 116/60, pulse 95, temperature 98.2 F (36.8 C), temperature source Oral, resp. rate 18, height 5\' 5"  (1.651 m), weight 105.4 kg, last menstrual period 12/14/2019, SpO2 97 %, unknown if currently breastfeeding.  Physical Exam:  General: alert, cooperative and no distress Chest: no respiratory distress Heart:regular rate, distal pulses intact Abdomen: soft, nontender,  Uterine Fundus: firm, appropriately tender DVT Evaluation: No calf swelling or tenderness Extremities: minimal edema edema Skin: warm, dry; incision with prevena placed  Recent Labs    08/31/20 0505  HGB 7.2*  HCT 22.2*    Assessment/Plan: Colleen Valentine is a 26 y.o. Q6I4799 s/p primary cesarean section at [redacted]w[redacted]d fetal malpresentation. Complicated by epidural absecess  POD#2 -   1. Epidural abscess per neurosurgery Catheter has been removed. PICC line in place for future antibiotics, continue daptomycin  2.Oral pain meds Regular diet as tolerated Loneox for prophylaxis  Continue routine postoperative care Probable Sunday d/c.   LOS: 11 days   Lynnda Shields, [place] Attending, Center for [facility] 09/01/2020, 7:52 AM

## 2020-09-01 NOTE — Progress Notes (Signed)
Patient ID: Colleen Valentine, female   DOB: 09/15/1995, 25 y.o.   MRN: [id number] Alert, conversant, MAEW.  Lower thoracic drsg removed to reveal well-approximated incision and drain site with pink skin edges. No surrounding erythema or swelling. Unable to express grainage from incision or drain site. Pt notes mild soreness with palpation. (Back pain is well controlled.) Dressing removed revealed small amount of betadine-stained, dried, thin drainage.   Going forward, would continue TID dressing changes. Cleanse site during shower (warm soapy water and rinse) or at bedside with saline wipe. DSD to cover. Monitor drainage. (Stop betadine paint).   When at home, cleanse at least BID with showers, apply dry dressing until site healed.  Call office to schedule 1 month post-op appt with Dr. Ellene Route. Call if drainage amount increases or color changes, or if incision becomes red or swollen.   IVAB to continue per Infectious Disease and Home Health.

## 2020-09-01 NOTE — Progress Notes (Signed)
Lower back dressing changed for moderate amt of serous drainage on old dressing.  Site clean and dry. New dressing applied.

## 2020-09-01 NOTE — Lactation Note (Signed)
This note was copied from a baby's chart. Lactation Consultation Note  Patient Name: Girl Aalaya Yadao KGOVP'C Date: 09/01/2020 Reason for consult: Follow-up assessment;Late-preterm 34-36.6wks Age:23 hours LC to room for f/u visit. Baby cuing and placed with mom in biological positioning. Baby fell back asleep after a brief attempt to bf. Reviewed normalcy and encouraged mom to continue current poc of attempting bf followed by pumping and offering EBM and formula supplementation. To continue volumes according to preterm algorithm.  Feeding Mother's Current Feeding Choice: Breast Milk and Formula Nipple Type: Extra Slow Flow  LATCH Score Latch: Too sleepy or reluctant, no latch achieved, no sucking elicited.  Audible Swallowing: None  Type of Nipple: Everted at rest and after stimulation  Comfort (Breast/Nipple): Soft / non-tender  Hold (Positioning): Assistance needed to correctly position infant at breast and maintain latch.  LATCH Score: 5   Interventions Interventions: Education  Consult Status Consult Status: Follow-up Follow-up type: In-patient   Gwynne Edinger, MA IBCLC 09/01/2020, 7:51 AM

## 2020-09-01 NOTE — Progress Notes (Signed)
Subjective: Patient reports a moderate amount of back pain that is well controlled with PO analgesics.   Objective: Vital signs in last 24 hours: Temp:  [97.5 F (36.4 C)-98.3 F (36.8 C)] 98.2 F (36.8 C) (04/08 0731) Pulse Rate:  [79-97] 95 (04/08 0731) Resp:  [16-18] 18 (04/08 0731) BP: (111-121)/(52-62) 116/60 (04/08 0731) SpO2:  [97 %-99 %] 97 % (04/08 0731)  Intake/Output from previous day: 04/07 0701 - 04/08 0700 In: 10 [I.V.:10] Out: -  Intake/Output this shift: No intake/output data recorded.  Physical Exam: Patient is awake, A/O X 4, conversant, and in good spirits. She laying in bed in NAD breast feeding her baby. Speech is fluent and appropriate. Doing well. MAEW with good strength that is symmetric bilaterally. 5/5 BUE/BLE. Dressing was reported to have been changed at 6:00 am this morning. Dressing has minimal serosanguinous drainage. Incision is well approximated with no active drainage appreciated. Peri wound area is erythremic.     Lab Results: Recent Labs    08/31/20 0505  WBC 9.2  HGB 7.2*  HCT 22.2*  PLT 256   BMET No results for input(s): NA, K, CL, CO2, GLUCOSE, BUN, CREATININE, CALCIUM in the last 72 hours.  Studies/Results: No results found.  Assessment/Plan: 25 year old female who is s/p T11-12 hemilaminectomy. Dressing was changed about 2 hours prior to my assessment. There was minimal serosanguinous drainage on the inside of the dressing. Will reassess dressing this afternoon and evaluate drainage. Will provide additional recommendations after reassessment.   IVAB per Infectious Disease via PICC.  Continue PT.  Pain management per OB/GYN.   Continue working on pain control, mobility and ambulating patient Postop follow up with Dr. Ellene Route in 1 month   LOS: 52 days     Marvis Moeller, DNP, NP-C 09/01/2020, 7:47 AM

## 2020-09-01 NOTE — Progress Notes (Signed)
Follow up visit with Thayer Headings after the early surprise delivery of her daughter, River. Shavontae joked that Dr Nelda Marseille jinxed her and said she is coping well and grateful River is so healthy. Edrie expressed gratitude that she's no longer pregnant though she reports she is in a lot of pain from both her back and her c-section incision. Pt reports breast feeding is going well and she feels well supported and educated by lactation. She is eager to have [place] meet her son Sherron Flemings and is excited to go home on Sunday.   Please page as further needs arise.  Donald Prose. Elyn Peers, M.Div. [facility] Chaplain Pager [phone] Office [phone]

## 2020-09-01 NOTE — Progress Notes (Signed)
    [facility] Visit Note  Colleen Valentine is a 25 y.o. [id number] female who presents for a postoperative visit. She is 1 week postop following a primary C-section completed on 08/30/2020 due to breech presentation and PPROM.  Delivery complicated by epidural abscess- s/p laminotomies and drainage on 3/31.  Currently has PICC in place on IV Daptomycin   Today she notes that she is doing well, pain well controlled.  Back is feeling much better and activity continues to increase.  Denies fever or chills.  Tolerating gen diet.  +Flatus, Regular BMs.   Overall doing well and reports no acute complaints   The pregnancy intention screening data noted above was reviewed. Potential methods of contraception were discussed. The patient elected to proceed with Unknown/Not Reported. Currently patient has female partner   Review of Systems Pertinent items are noted in HPI.    Objective:  BP 125/73 (BP Location: Left Arm, Patient Position: Sitting, Cuff Size: Normal)   Pulse 91   Wt 229 lb (103.9 kg)   LMP 12/14/2019 Comment: has had some spotting  BMI 38.11 kg/m    Physical Examination:  GENERAL ASSESSMENT: well developed and well nourished SKIN: normal color, no lesions CHEST: normal air exchange, respiratory effort normal with no retractions HEART: regular rate and rhythm ABDOMEN: soft, non-distended, obese INCISION: Prevena removed- incision healing appropriately C/D/I EXTREMITY: no calf tenderness bilaterally PSYCH: mood appropriate, normal affect       Assessment:    1wk incision check   Plan:   -Postop C-section: Reviewed care of incision and importance of keeping the area clean and dry -Epidural abscess: currently on IV antibiotics, home health to start today, f/u with ID scheduled for 4/22 Plan to f/u in 2-3wks  Janyth Pupa, DO Attending [place], [place] for [employer], [place]

## 2020-09-01 NOTE — Progress Notes (Signed)
Physical Therapy Treatment Patient Details Name: Colleen Valentine MRN: [id number] DOB: 12/16/1995 Today's Date: 09/01/2020    History of Present Illness 25 y/o female [redacted] weeks gestation who presents with T10-L3 epidural abscess on 08/22/2020. She is now s/p T11-L1 laminotomies with decompression of epidural abscess. Pt underwent C-section on 4/6 with placement of wound vac,    PMH significant for suicide attempt, seizures, polysubstance abuse, herpes, acute pyelonephritis.    PT Comments    Pt progressing well towards goals. Improved tolerance for gait and mobility tasks, however, continues to be very guarded. Continued education about importance of mobility tasks. Discussed lifting restrictions in regards to her other son and pt reports her baby sitter will keep him when her girlfriend is not home. Current recommendations appropriate. Will continue to follow acutely.     Follow Up Recommendations  No PT follow up;Supervision for mobility/OOB     Equipment Recommendations  Rolling walker with 5" wheels;Other (comment);3in1 (PT) (shower chair)    Recommendations for Other Services       Precautions / Restrictions Precautions Precautions: Fall;Back Precaution Booklet Issued: Yes (comment) Precaution Comments: wound vac Required Braces or Orthoses: Other Brace Other Brace: abdominal binder Restrictions Weight Bearing Restrictions: No    Mobility  Bed Mobility Overal bed mobility: Modified Independent             General bed mobility comments: Increased time required.    Transfers Overall transfer level: Needs assistance Equipment used: Rolling walker (2 wheeled) Transfers: Sit to/from Stand Sit to Stand: Min guard         General transfer comment: Min guard for safety. Cues for hand placement for ease of transfers.  Ambulation/Gait Ambulation/Gait assistance: Min guard;Supervision Gait Distance (Feet): 300 Feet Assistive device: Rolling walker (2 wheeled) Gait  Pattern/deviations: Step-through pattern;Decreased stride length;Wide base of support Gait velocity: Decreased   General Gait Details: Very guarded gait, but improved tolerance for previous session. Educated about importance of mobility for recovery.   Stairs             Wheelchair Mobility    Modified Rankin (Stroke Patients Only)       Balance Overall balance assessment: Needs assistance Sitting-balance support: No upper extremity supported;Feet supported Sitting balance-Leahy Scale: Fair     Standing balance support: Bilateral upper extremity supported;No upper extremity supported Standing balance-Leahy Scale: Fair Standing balance comment: Can maintain static standing without UE support during toileting task                            Cognition Arousal/Alertness: Awake/alert Behavior During Therapy: WFL for tasks assessed/performed Overall Cognitive Status: Within Functional Limits for tasks assessed                                        Exercises      General Comments General comments (skin integrity, edema, etc.): Girlfriend present during session      Pertinent Vitals/Pain Pain Assessment: Faces Faces Pain Scale: Hurts little more Pain Location: Back and abdomen Pain Descriptors / Indicators: Operative site guarding;Dull;Stabbing;Throbbing Pain Intervention(s): Limited activity within patient's tolerance;Monitored during session;Repositioned    Home Living                      Prior Function            PT Goals (current goals  can now be found in the care plan section) Acute Rehab PT Goals Patient Stated Goal: Decrease pain, be safe at home PT Goal Formulation: With patient Time For Goal Achievement: 09/06/20 Potential to Achieve Goals: Good Progress towards PT goals: Progressing toward goals    Frequency    Min 5X/week      PT Plan Current plan remains appropriate    Co-evaluation               AM-PAC PT "6 Clicks" Mobility   Outcome Measure  Help needed turning from your back to your side while in a flat bed without using bedrails?: None Help needed moving from lying on your back to sitting on the side of a flat bed without using bedrails?: None Help needed moving to and from a bed to a chair (including a wheelchair)?: A Little Help needed standing up from a chair using your arms (e.g., wheelchair or bedside chair)?: A Little Help needed to walk in hospital room?: A Little Help needed climbing 3-5 steps with a railing? : A Lot 6 Click Score: 19    End of Session Equipment Utilized During Treatment: Gait belt Activity Tolerance: Patient limited by pain Patient left: in bed;with call bell/phone within reach;Other (comment) (baby in bed with pt) Nurse Communication: Mobility status PT Visit Diagnosis: Unsteadiness on feet (R26.81);Pain Pain - part of body:  (back and abdomen)     Time: 9024-0973 PT Time Calculation (min) (ACUTE ONLY): 32 min  Charges:  $Gait Training: 8-22 mins $Therapeutic Activity: 8-22 mins                     Colleen Valentine, DPT  Acute Rehabilitation Services  Pager: [phone] Office: [phone]    Colleen Valentine 09/01/2020, 2:36 PM

## 2020-09-02 LAB — TYPE AND SCREEN
ABO/RH(D): O POS
Antibody Screen: NEGATIVE
Unit division: 0

## 2020-09-02 LAB — BPAM RBC
Blood Product Expiration Date: 202205062359
Unit Type and Rh: 5100

## 2020-09-02 LAB — RPR: RPR Ser Ql: NONREACTIVE

## 2020-09-02 NOTE — Progress Notes (Signed)
Subjective: Postpartum Day #3/POD#7: Cesarean Delivery/decompression of epidural abscess Patient reports incisional pain, tolerating PO and no problems voiding.    Objective: Vital signs in last 24 hours: Temp:  [97.3 F (36.3 C)-98.2 F (36.8 C)] 97.3 F (36.3 C) (04/09 0012) Pulse Rate:  [95-104] 101 (04/09 0012) Resp:  [16-18] 16 (04/08 1927) BP: (113-124)/(57-71) 115/64 (04/09 0012) SpO2:  [97 %-99 %] 99 % (04/09 0012)  Physical Exam:  General: alert, cooperative and no distress Lochia: appropriate Uterine Fundus: firm Incision: no significant drainage, no dehiscence, no significant erythema DVT Evaluation: No evidence of DVT seen on physical exam. Back dressing dry non tender  Recent Labs    08/31/20 0505  HGB 7.2*  HCT 22.2*    Assessment/Plan: Status post Cesarean section. Doing well postoperatively.  Continue current care.  Plan discharge tomorrow with [facility] and anitbiotics to complete 6 week course of daptomycin  Florian Buff 09/02/2020, 7:27 AM

## 2020-09-02 NOTE — Lactation Note (Signed)
This note was copied from a baby's chart. Lactation Consultation Note  Patient Name: Colleen Valentine [id number] Date: 09/02/2020 Reason for consult: Follow-up assessment;Late-preterm 34-36.6wks Age:23 hours   Mom was excited. She collected 15 ml with last pumping session.  Her breasts feel heavy and she has a filling sensation in her breasts.  Mom has difficulty latching and has not attempted since yesterday.  She is pumping and has a few bottles of EBM, small amounts, stored in refrigerator.    LC provided another copy of LPTI guidelines.  Reviewed with pt.  Mom states her milk is flowing easily with hand expression.  Infant was stirring so LC offered to assist.  LC placed infant STS and infant could not sustain latch and suck was painful for mom. Infant took a few sucks then came off.  NS 24 was prefilled with EBM.  Infant latched well in football hold without assistance from [facility].  Infant had multiple swallows with massage and compression.  Infant fed 7.5 mintues actively sucking with cheeks flush to breasts with no NS visible.  Mom denies discomfort.    Infant had a long pause and was brought off the breast to be burped.  Shield had milk in the bottom.  NS filled again when infant began cueing. LC noticed mom has a 20 NS on bedside table.  Mom explained it didn't work.  Proper fit and application explained.  Nipple was pulled into the shield after feeding.    Reviewed importance of using pillows and rolled blankets for support during feeding.  Praised mom for pumping efforts.  Encouraged her to continue to pump and reviewed importance of supplementing after BF.  Mom will try the 27 flange with next pump session.  She is using coconut oil but has pain when pumping.  Feeding infant when LC was in room so mom will call out for staff to check flange fit and will pump with the 27 to see if comfort increases.    Plan:  Apply NS, prefill with EBM, and BF.  Supplement with EBM or formula using LPTI  guidelines.    Maternal Data Has patient been taught Hand Expression?: Yes  Feeding Mother's Current Feeding Choice: Breast Milk and Formula Nipple Type: Extra Slow Flow  LATCH Score Latch: Repeated attempts needed to sustain latch, nipple held in mouth throughout feeding, stimulation needed to elicit sucking reflex.  Audible Swallowing: A few with stimulation  Type of Nipple: Flat  Comfort (Breast/Nipple): Filling, red/small blisters or bruises, mild/mod discomfort  Hold (Positioning): Assistance needed to correctly position infant at breast and maintain latch.  LATCH Score: 5   Lactation Tools Discussed/Used Tools: Pump;Flanges;Nipple Shields (curved tip syringe to prefill NS) Nipple shield size: 24 Flange Size: 24;27 (27 provided.  Mom breastfeeding infant at time of visit but mom states pumping is painful. LC did not assess flange fit.  Encouraged to call out when she pumps to have staff observe for fit and hopefully more comfort.) Breast pump type: Double-Electric Breast Pump Pump Education: Milk Storage Reason for Pumping: LPTI, supplement after BF Pumping frequency: every 3 hours Pumped volume: 15 mL  Interventions Interventions: Breast feeding basics reviewed;Assisted with latch;Skin to skin;Breast massage;Support pillows;Position options;Adjust position;Expressed milk  Discharge Pump: DEBP  Consult Status Consult Status: Follow-up Date: 09/03/20 Follow-up type: In-patient    Ferne Coe [facility] 09/02/2020, 10:38 AM

## 2020-09-02 NOTE — Progress Notes (Signed)
Lumbar and Thoracic incisions cleansed with warm soapy water and area dried.  New Dressing placed over sited.  Clear, thin yellow stained fluid note from both sites.  No odor noted.  Patient tolerated well.

## 2020-09-02 NOTE — Progress Notes (Signed)
PT Cancellation Note  Patient Details Name: Colleen Valentine MRN: [id number] DOB: 1996/02/10   Cancelled Treatment:    Reason Eval/Treat Not Completed: Other (comment) Pt refusing stair training; states she would rather try in AM. RN reports pt has been ambulating to and from bathroom today.  Wyona Almas, PT, DPT Acute Rehabilitation Services Pager [phone] Office [phone]    Deno Etienne 09/02/2020, 2:01 PM

## 2020-09-03 LAB — AEROBIC/ANAEROBIC CULTURE W GRAM STAIN (SURGICAL/DEEP WOUND)

## 2020-09-03 LAB — CK: Total CK: 43 U/L (ref 38–234)

## 2020-09-03 MED ORDER — ENOXAPARIN (LOVENOX) PATIENT EDUCATION KIT
PACK | Freq: Once | Status: AC
  Filled 2020-09-03: qty 1

## 2020-09-03 MED ORDER — HEPARIN SOD (PORK) LOCK FLUSH 100 UNIT/ML IV SOLN
250.0000 [IU] | INTRAVENOUS | Status: AC | PRN
  Administered 2020-09-03: 250 [IU]
  Filled 2020-09-03: qty 2.5

## 2020-09-03 NOTE — Lactation Note (Signed)
This note was copied from a baby's chart. Lactation Consultation Note  Patient Name: Colleen Valentine [id number] Date: 09/03/2020 Reason for consult: Follow-up assessment;Late-preterm 34-36.6wks;Infant < 6lbs Age:23 days   LC in to visit with P2 Mom of LPTI infant on day of discharge.  Baby is at 5% weight loss with good output.  Mom has breastfeeding and supplementing baby with formula+/EBM.  Last pumping Mom expressed 30 ml.  Mom to use maintenance setting on pump from now on.    Mom refused a [facility] loaner pump as her sitter was going to loan her a pump.  Mom shown how to utilize pump parts to create a hand pump.  Recommended Mom call [facility] office tomorrow (Monday).  Mom desires OP lactation assistance.  Mom encouraged to offer breast with cues, follow up with supplement of EBM and pump both breasts 15-30 mins.  Engorgement prevention and treatment reviewed.  Encouraged Mom to call prn for concerns.  Lactation Tools Discussed/Used Tools: Pump;Flanges Flange Size: 27 Breast pump type: Double-Electric Breast Pump  Interventions Interventions: Breast feeding basics reviewed;Skin to skin;Breast massage;Hand express;DEBP;Hand pump;Education  Discharge Discharge Education: Outpatient Epic message sent;Outpatient recommendation;Engorgement and breast care  Consult Status Consult Status: Complete Date: 09/03/20 Follow-up type: [place], Erol Flanagin E 09/03/2020, 9:10 AM

## 2020-09-03 NOTE — Social Work (Signed)
CSW received consult due to score 23 on Edinburgh Depression Screen.    CSW met with [name] to assess and offer support. CSW introduced self and role. [name] was in restroom and infant was sleeping in bassinet. CSW offered to return to speak with [name]. [name] declined and stated she would like to complete assessment. [name] was pleasant, engaged and receptive to speaking with CSW. CSW informed [name] of reason for consult. [name] was understanding and expressed she experienced really bad PPD with her son. [name] reported she is not currently having thoughts of self harm or harming anyone else, but she is feeling as if she is not good enough for infant. [name] shared she feels as though infant is not attached to her, due to having challenges latching. [name] stated she had random crying spells yesterday for "no reason." CSW empathized with [name] and provided positive affirmations to [name]. CSW ensured [name] that infant identifies that she is her mother and that she is good enough for infant. [name] was thankful for the encouraging words. [name] disclosed that although the Lamictal assists with stabling her mood, she still has really bad anxiety which contributes to her feelings. CSW discussed supports and and therapy with [name]. [name] reported her fiance' is "absolutely great" and able to identify when [name] is having challenges. [name] stated her fiance' steps up when needed and she is grateful that she will be a support once discharged home. CSW discussed therapy with [name]. [name] reported she attended therapy all the time from childhood into her teens. [name] shared she would love to start therapy again in-person. CSW provided [name] with perinatal mental health resources and encouraged [name] to research providers that she she may find comfortable.   [name] denies any current SI or HI.   CSW identifies no further need for intervention and no barriers to discharge at this time.  Vernelle Wisner, LCSWA Clinical Social Work [facility] [phone] 

## 2020-09-03 NOTE — Progress Notes (Signed)
Dressing change to mid and lower back.  Sites clean and intact.  Small amount serous fluid noted from upper site without odor.  Cleansed and redressed.  Patient tolerated well.

## 2020-09-03 NOTE — Progress Notes (Signed)
Physical Therapy Treatment Patient Details Name: Colleen Valentine MRN: [id number] DOB: February 17, 1996 Today's Date: 09/03/2020    History of Present Illness 25 y/o female [redacted] weeks gestation who presents with T10-L3 epidural abscess on 08/22/2020. She is now s/p T11-L1 laminotomies with decompression of epidural abscess. Pt underwent C-section on 4/6 with placement of wound vac,    PMH significant for suicide attempt, seizures, polysubstance abuse, herpes, acute pyelonephritis.    PT Comments    Pt eager to go home; displays improved pain control, activity tolerance and balance this session. Ambulating limited hallway distances with no assistive device. Negotiated 10 steps with R railing, practicing both forwards and sideways. Continued reinforcement with activity recommendations and precautions. D/c plan remains appropriate.     Follow Up Recommendations  No PT follow up;Supervision for mobility/OOB     Equipment Recommendations  Rolling walker with 5" wheels;Other (comment);3in1 (PT) (shower chair)    Recommendations for Other Services       Precautions / Restrictions Precautions Precautions: Fall;Back Precaution Booklet Issued: Yes (comment) Precaution Comments: wound vac Required Braces or Orthoses: Other Brace Other Brace: abdominal binder Restrictions Weight Bearing Restrictions: No    Mobility  Bed Mobility               General bed mobility comments: Standing in room upon arrival    Transfers Overall transfer level: Independent Equipment used: None                Ambulation/Gait Ambulation/Gait assistance: Supervision Gait Distance (Feet): 50 Feet Assistive device: None Gait Pattern/deviations: Step-through pattern;Decreased stride length;Wide base of support Gait velocity: Decreased   General Gait Details: Slow with mild dynamic instability but no overt LOB. Supervision for safety   Stairs Stairs: Yes Stairs assistance: Min guard Stair Management:  One rail Right;Sideways;Step to pattern;Forwards Number of Stairs: 10 General stair comments: 10 steps ascending with R railing and L HHA, cues for step by step and sequencing. Descending practiced sideways with R railing. Cues for leaving enough room for second foot to follow.   Wheelchair Mobility    Modified Rankin (Stroke Patients Only)       Balance Overall balance assessment: Needs assistance Sitting-balance support: No upper extremity supported;Feet supported Sitting balance-Leahy Scale: Good     Standing balance support: No upper extremity supported;During functional activity Standing balance-Leahy Scale: Good Standing balance comment: Washing pump at sink independently                            Cognition Arousal/Alertness: Awake/alert Behavior During Therapy: WFL for tasks assessed/performed Overall Cognitive Status: Within Functional Limits for tasks assessed                                        Exercises      General Comments        Pertinent Vitals/Pain Pain Assessment: Faces Faces Pain Scale: Hurts little more Pain Location: Back and abdomen Pain Descriptors / Indicators: Operative site guarding;Dull;Stabbing;Throbbing Pain Intervention(s): Monitored during session    Home Living                      Prior Function            PT Goals (current goals can now be found in the care plan section) Acute Rehab PT Goals Patient Stated Goal: Decrease pain, be  safe at home PT Goal Formulation: With patient Time For Goal Achievement: 09/06/20 Potential to Achieve Goals: Good Progress towards PT goals: Progressing toward goals    Frequency    Min 5X/week      PT Plan Current plan remains appropriate    Co-evaluation              AM-PAC PT "6 Clicks" Mobility   Outcome Measure  Help needed turning from your back to your side while in a flat bed without using bedrails?: None Help needed moving from  lying on your back to sitting on the side of a flat bed without using bedrails?: None Help needed moving to and from a bed to a chair (including a wheelchair)?: A Little Help needed standing up from a chair using your arms (e.g., wheelchair or bedside chair)?: A Little Help needed to walk in hospital room?: A Little Help needed climbing 3-5 steps with a railing? : A Lot 6 Click Score: 19    End of Session Equipment Utilized During Treatment: Gait belt Activity Tolerance: Patient tolerated treatment well Patient left: Other (comment) (standing in room) Nurse Communication: Mobility status PT Visit Diagnosis: Unsteadiness on feet (R26.81);Pain Pain - part of body:  (back and abdomen)     Time: 8016-5537 PT Time Calculation (min) (ACUTE ONLY): 15 min  Charges:  $Gait Training: 8-22 mins                     Wyona Almas, PT, DPT Acute Rehabilitation Services Pager [phone] Office [phone]    Deno Etienne 09/03/2020, 11:46 AM

## 2020-09-03 NOTE — Discharge Instructions (Signed)
Cesarean Delivery, Care After This sheet gives you information about how to care for yourself after your procedure. Your health care provider may also give you more specific instructions. If you have problems or questions, contact your health care provider. What can I expect after the procedure? After the procedure, it is common to have:  A small amount of blood or clear fluid coming from the incision.  Some redness, swelling, and pain in your incision area.  Some abdominal pain and soreness.  Vaginal bleeding (lochia). Even though you did not have a vaginal delivery, you will still have vaginal bleeding and discharge.  Pelvic cramps.  Fatigue. You may have pain, swelling, and discomfort in the tissue between your vagina and your anus (perineum) if:  Your C-section was unplanned, and you were allowed to labor and push.  An incision was made in the area (episiotomy) or the tissue tore during attempted vaginal delivery. Follow these instructions at home: Incision care  Follow instructions from your health care provider about how to take care of your incision. Make sure you: ? Wash your hands with soap and water before you change your bandage (dressing). If soap and water are not available, use hand sanitizer. ? If you have a dressing, change it or remove it as told by your health care provider. ? Leave stitches (sutures), skin staples, skin glue, or adhesive strips in place. These skin closures may need to stay in place for 2 weeks or longer. If adhesive strip edges start to loosen and curl up, you may trim the loose edges. Do not remove adhesive strips completely unless your health care provider tells you to do that.  Check your incision area every day for signs of infection. Check for: ? More redness, swelling, or pain. ? More fluid or blood. ? Warmth. ? Pus or a bad smell.  Do not take baths, swim, or use a hot tub until your health care provider says it's okay. Ask your health  care provider if you can take showers.  When you cough or sneeze, hug a pillow. This helps with pain and decreases the chance of your incision opening up (dehiscing). Do this until your incision heals.   Medicines  Take over-the-counter and prescription medicines only as told by your health care provider.  If you were prescribed an antibiotic medicine, take it as told by your health care provider. Do not stop taking the antibiotic even if you start to feel better.  Do not drive or use heavy machinery while taking prescription pain medicine. Lifestyle  Do not drink alcohol. This is especially important if you are breastfeeding or taking pain medicine.  Do not use any products that contain nicotine or tobacco, such as cigarettes, e-cigarettes, and chewing tobacco. If you need help quitting, ask your health care provider. Eating and drinking  Drink at least 8 eight-ounce glasses of water every day unless told not to by your health care provider. If you breastfeed, you may need to drink even more water.  Eat high-fiber foods every day. These foods may help prevent or relieve constipation. High-fiber foods include: ? Whole grain cereals and breads. ? Brown rice. ? Beans. ? Fresh fruits and vegetables. Activity  If possible, have someone help you care for your baby and help with household activities for at least a few days after you leave the hospital.  Return to your normal activities as told by your health care provider. Ask your health care provider what activities are safe for   you.  Rest as much as possible. Try to rest or take a nap while your baby is sleeping.  Do not lift anything that is heavier than 10 lbs (4.5 kg), or the limit that you were told, until your health care provider says that it is safe.  Talk with your health care provider about when you can engage in sexual activity. This may depend on your: ? Risk of infection. ? How fast you heal. ? Comfort and desire to  engage in sexual activity.   General instructions  Do not use tampons or douches until your health care provider approves.  Wear loose, comfortable clothing and a supportive and well-fitting bra.  Keep your perineum clean and dry. Wipe from front to back when you use the toilet.  If you pass a blood clot, save it and call your health care provider to discuss. Do not flush blood clots down the toilet before you get instructions from your health care provider.  Keep all follow-up visits for you and your baby as told by your health care provider. This is important. Contact a health care provider if:  You have: ? A fever. ? Bad-smelling vaginal discharge. ? Pus or a bad smell coming from your incision. ? Difficulty or pain when urinating. ? A sudden increase or decrease in the frequency of your bowel movements. ? More redness, swelling, or pain around your incision. ? More fluid or blood coming from your incision. ? A rash. ? Nausea. ? Little or no interest in activities you used to enjoy. ? Questions about caring for yourself or your baby.  Your incision feels warm to the touch.  Your breasts turn red or become painful or hard.  You feel unusually sad or worried.  You vomit.  You pass a blood clot from your vagina.  You urinate more than usual.  You are dizzy or light-headed. Get help right away if:  You have: ? Pain that does not go away or get better with medicine. ? Chest pain. ? Difficulty breathing. ? Blurred vision or spots in your vision. ? Thoughts about hurting yourself or your baby. ? New pain in your abdomen or in one of your legs. ? A severe headache.  You faint.  You bleed from your vagina so much that you fill more than one sanitary pad in one hour. Bleeding should not be heavier than your heaviest period. Summary  After the procedure, it is common to have pain at your incision site, abdominal cramping, and slight bleeding from your vagina.  Check  your incision area every day for signs of infection.  Tell your health care provider about any unusual symptoms.  Keep all follow-up visits for you and your baby as told by your health care provider. This information is not intended to replace advice given to you by your health care provider. Make sure you discuss any questions you have with your health care provider. Document Revised: 11/19/2017 Document Reviewed: 11/19/2017 Elsevier Patient Education  2021 [employer]vier Inc.  

## 2020-09-03 NOTE — Progress Notes (Signed)
Patient demonstrated proper cleaning and technique for giving herself the Lovenox injection.  Patient states she understands and has no further questions.

## 2020-09-03 NOTE — Progress Notes (Signed)
Subjective: Postpartum Day 4/8: Cesarean Delivery/decompression of epidural bascess Patient reports incisional pain, tolerating PO, + BM and no problems voiding.    Objective: Vital signs in last 24 hours: Temp:  [97.6 F (36.4 C)-98.3 F (36.8 C)] 97.6 F (36.4 C) (04/10 0736) Pulse Rate:  [85-99] 96 (04/10 0736) Resp:  [17-18] 17 (04/10 0736) BP: (106-129)/(51-66) 119/57 (04/10 0736) SpO2:  [96 %-100 %] 96 % (04/10 0736)  Physical Exam:  General: alert, cooperative and no distress Lochia: appropriate Uterine Fundus: firm Incision: healing well DVT Evaluation: No evidence of DVT seen on physical exam.  No results for input(s): HGB, HCT in the last 72 hours.  Assessment/Plan: Status post Cesarean section. Doing well postoperatively.  Continue current care.  Plan is to discharge after her daptomycin dose today at 1700, takes about an hour HH is all set Follow up with [facility] Thursday   Florian Buff 09/03/2020, 8:30 AM

## 2020-09-04 ENCOUNTER — Ambulatory Visit (INDEPENDENT_AMBULATORY_CARE_PROVIDER_SITE_OTHER): Payer: Medicaid Other | Admitting: Obstetrics & Gynecology

## 2020-09-04 ENCOUNTER — Other Ambulatory Visit: Payer: Self-pay

## 2020-09-04 ENCOUNTER — Encounter: Payer: Self-pay | Admitting: Obstetrics & Gynecology

## 2020-09-04 VITALS — BP 125/73 | HR 91 | Wt 229.0 lb

## 2020-09-04 DIAGNOSIS — Z4889 Encounter for other specified surgical aftercare: Secondary | ICD-10-CM

## 2020-09-04 DIAGNOSIS — G062 Extradural and subdural abscess, unspecified: Secondary | ICD-10-CM | POA: Diagnosis not present

## 2020-09-05 ENCOUNTER — Other Ambulatory Visit: Payer: Self-pay

## 2020-09-05 ENCOUNTER — Emergency Department (HOSPITAL_COMMUNITY): Payer: Medicaid Other

## 2020-09-05 ENCOUNTER — Encounter (HOSPITAL_COMMUNITY): Payer: Self-pay | Admitting: Emergency Medicine

## 2020-09-05 ENCOUNTER — Emergency Department (HOSPITAL_COMMUNITY)
Admission: EM | Admit: 2020-09-05 | Discharge: 2020-09-06 | Disposition: A | Discharge status: Home / Self Care | Payer: Medicaid Other | Attending: Emergency Medicine | Admitting: Emergency Medicine

## 2020-09-05 DIAGNOSIS — R609 Edema, unspecified: Secondary | ICD-10-CM | POA: Diagnosis not present

## 2020-09-05 DIAGNOSIS — R600 Localized edema: Secondary | ICD-10-CM | POA: Diagnosis not present

## 2020-09-05 DIAGNOSIS — G062 Extradural and subdural abscess, unspecified: Secondary | ICD-10-CM | POA: Diagnosis not present

## 2020-09-05 DIAGNOSIS — Z87891 Personal history of nicotine dependence: Secondary | ICD-10-CM | POA: Diagnosis not present

## 2020-09-05 DIAGNOSIS — R079 Chest pain, unspecified: Secondary | ICD-10-CM | POA: Diagnosis not present

## 2020-09-05 DIAGNOSIS — R300 Dysuria: Secondary | ICD-10-CM | POA: Diagnosis present

## 2020-09-05 DIAGNOSIS — N926 Irregular menstruation, unspecified: Secondary | ICD-10-CM | POA: Insufficient documentation

## 2020-09-05 DIAGNOSIS — N3000 Acute cystitis without hematuria: Secondary | ICD-10-CM

## 2020-09-05 DIAGNOSIS — O99893 Other specified diseases and conditions complicating puerperium: Secondary | ICD-10-CM | POA: Diagnosis not present

## 2020-09-05 DIAGNOSIS — O2310 Infections of bladder in pregnancy, unspecified trimester: Secondary | ICD-10-CM | POA: Diagnosis not present

## 2020-09-05 LAB — D-DIMER, QUANTITATIVE: D-Dimer, Quant: 1.22 ug/mL-FEU — ABNORMAL HIGH (ref 0.00–0.50)

## 2020-09-05 NOTE — ED Provider Notes (Signed)
[facility] EMERGENCY DEPARTMENT Provider Note   CSN: [id number] Arrival date & time: 09/05/20  2055     History No chief complaint on file.   Colleen Valentine is a 25 y.o. female.  Patient presents to the emergency department for evaluation of swelling of her feet.  Patient reports that she had a C-section on April 6.  She had a small amount of swelling after the surgery but today her swelling worsened.  She is not experiencing any shortness of breath.  She has had some intermittent chest pain.  This pain is fleeting and is not currently present. Patient reports that she saw her OB/GYN earlier today, but the swelling worsened.  She has not had any problems with blood pressure during the pregnancy.  Patient reports that she is currently getting Lovenox and has been daily since March 29.  This is preventative, as she was thought to be at risk for blood clot but has does not have a history of blood clots.  Patient is also complaining of painful urination, reports a sharp pain in her urethra when she passes urine.        Past Medical History:  Diagnosis Date  . Acute pyelonephritis 05/17/2018  . Anemia   . Anxiety   . Complication of anesthesia   . Depression   . GERD (gastroesophageal reflux disease)   . Herpes genitalia   . Insomnia   . Obesity   . Polysubstance abuse ([place]) 04/01/2011  . PONV (postoperative nausea and vomiting)   . Seizures ([place])    once in 2016 due to alcohol poisioning  . Suicide [facility])     Patient Active Problem List   Diagnosis Date Noted  . Irregular menses 09/05/2020  . Epidural abscess   . Vertebral osteomyelitis ([place])   . MRSA bacteremia   . [redacted] weeks gestation of pregnancy 08/22/2020  . 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 Spinal cord compression ([place]) 08/21/2020  . Splenomegaly 08/16/2020  . Low back pain 08/16/2020  . Vaginal bleeding in pregnancy, third trimester 07/29/2020  . Marijuana abuse 07/29/2020  . Anemia in pregnancy 07/29/2020  .  Gestational diabetes mellitus in third trimester 07/29/2020  . Gram-positive cocci bacteremia 07/20/2020  . Abscess of axilla, left 07/19/2020  . Marijuana user 03/28/2020  . Gonorrhea 02/04/2020  . Chlamydia infection 01/25/2020  . Trichomonas infection 11/28/2019  . Abdominal pain 02/27/2017  . Lower abdominal pain 02/27/2017  . Esophageal dysphagia 02/27/2017  . Current smoker 08/12/2016  . Genital herpes simplex 04/08/2016  . GERD (gastroesophageal reflux disease) 10/17/2015  . BMI 40.0-44.9, adult ([place]) 10/17/2015  . Chlamydia contact, untreated 10/18/2014  . Scoliosis 05/04/2013  . Spondylolysis 05/04/2013  . Anxiety state 10/31/2012  . Depression with anxiety 10/31/2012  . ADHD (attention deficit hyperactivity disorder), combined type 07/21/2012  . MDD (major depressive disorder), recurrent episode, moderate ([place]) 04/01/2011  . Oppositional defiant disorder 04/01/2011    Past Surgical History:  Procedure Laterality Date  . CESAREAN SECTION N/A 08/30/2020   Procedure: CESAREAN SECTION;  Surgeon: Gwynne Edinger, MD;  Location: [facility] LD ORS;  Service: Obstetrics;  Laterality: N/A;  . CHOLECYSTECTOMY  2015  . COSMETIC SURGERY  Age 70   dog bite to face  . IR FLUORO GUIDED NEEDLE PLC ASPIRATION/INJECTION LOC  08/22/2020  . IR US GUIDE BX ASP/DRAIN  08/22/2020  . LAMINECTOMY N/A 08/22/2020   Procedure: THORACIC TWELVE TO LUMBAR ONELAMINECTOMY FOR DRAINAGE OF EPIDURAL ABSCESS;  Surgeon: Kristeen Miss, MD;  Location:  [facility];  Service: Neurosurgery;  Laterality: N/A;     OB History    Gravida  5   Para  2   Term  1   Preterm  1   AB  3   Living  2     SAB  2   IAB  1   Ectopic      Multiple  0   Live Births  2           Family History  Problem Relation Age of Onset  . Alcohol abuse Mother   . Stroke Mother   . Bipolar disorder Mother   . Drug abuse Mother   . Alcohol abuse Father   . Bipolar disorder Father   . Drug abuse Father   . Colon cancer  Maternal Grandfather   . Crohn's disease Paternal Grandmother   . Crohn's disease Paternal Aunt   . Crohn's disease Paternal Aunt   . Crohn's disease Paternal Uncle   . Colon cancer Paternal Uncle   . Cancer Maternal Aunt        "stomach cancer"  . Celiac disease Neg Hx     Social History   Tobacco Use  . Smoking status: Former Smoker    Packs/day: 0.50    Years: 5.00    Pack years: 2.50    Types: Cigarettes    Quit date: 11/24/2019    Years since quitting: 0.7  . Smokeless tobacco: Never Used  . Tobacco comment: one pack cigarettes daily  Vaping Use  . Vaping Use: Some days  Substance Use Topics  . Alcohol use: Not Currently    Alcohol/week: 0.0 standard drinks    Comment: social  . Drug use: Yes    Types: Marijuana    Comment: SMOKED TODAY    Home Medications Prior to Admission medications   Medication Sig Start Date End Date Taking? Authorizing Provider  cefdinir (OMNICEF) 300 MG capsule Take 1 capsule (300 mg total) by mouth 2 (two) times daily. 09/06/20  Yes Ryu Cerreta, Gwenyth Allegra, MD  acetaminophen (TYLENOL) 500 MG tablet Take 1,000 mg by mouth every 6 (six) hours as needed for mild pain or headache.    [provider]  cetirizine (ZYRTEC) 10 MG tablet Take 1 tablet (10 mg total) by mouth daily. Patient not taking: Reported on 09/04/2020 03/29/20   Wurst, [place], PA-C  daptomycin (CUBICIN) IVPB Inject 900 mg into the vein daily. Indication:  Epidural abscess First Dose: No Last Day of Therapy:  10/18/2020 Labs - Once weekly:  CBC/D, BMP, and CPK Labs - Every other week:  ESR and CRP Method of administration: IV Push Method of administration may be changed at the discretion of home infusion pharmacist based upon assessment of the patient and/or caregiver's ability to self-administer the medication ordered. 08/31/20 10/24/20  Janyth Pupa, DO  enoxaparin (LOVENOX) 60 MG/0.6ML injection Inject 0.6 mLs (60 mg total) into the skin daily. 09/02/20 10/14/20  Janyth Pupa, DO  hydrOXYzine (VISTARIL) 25 MG capsule Take 1 capsule (25 mg total) by mouth daily as needed for anxiety. 11/18/19   Norman Clay, MD  lamoTRIgine (LAMICTAL) 25 MG tablet Take 25 mg by mouth 2 (two) times daily.    [provider]  omeprazole (PRILOSEC) 20 MG capsule Take 20 mg by mouth daily.    [provider]  oxyCODONE (OXY IR/ROXICODONE) 5 MG immediate release tablet Take 1-2 tablets (5-10 mg total) by mouth every 6 (six) hours as needed for up to  7 days for moderate pain or severe pain. 09/01/20 09/08/20  Janyth Pupa, DO  Prenatal Vit-Fe Fumarate-FA (PRENATAL MULTIVITAMIN) TABS tablet Take 1 tablet by mouth daily at 12 noon. 09/01/20   Janyth Pupa, DO  senna-docusate (SENOKOT-S) 8.6-50 MG tablet Take 2 tablets by mouth daily for 30 doses. Patient not taking: Reported on 09/04/2020 09/01/20 10/01/20  Janyth Pupa, DO  valACYclovir (VALTREX) 500 MG tablet Take 500 mg by mouth daily.    [provider]  albuterol (VENTOLIN HFA) 108 (90 Base) MCG/ACT inhaler Inhale 1-2 puffs into the lungs every 6 (six) hours as needed for wheezing or shortness of breath. Patient not taking: Reported on 11/18/2019 06/14/19 01/01/20  Wurst, [place], PA-C  pantoprazole (PROTONIX) 40 MG tablet Take 1 tablet (40 mg total) by mouth daily before breakfast. 06/25/18 01/14/19  Baruch Gouty, FNP    Allergies    Patient has no known allergies.  Review of Systems   Review of Systems  Respiratory: Negative for shortness of breath.   Cardiovascular: Positive for chest pain and leg swelling.  Genitourinary: Positive for dysuria.  All other systems reviewed and are negative.   Physical Exam Updated Vital Signs BP (!) 110/54   Pulse 81   Temp 98.3 F (36.8 C) (Oral)   Resp 14   LMP 12/14/2019 Comment: has had some spotting  SpO2 100%   Physical Exam Vitals and nursing note reviewed.  Constitutional:      General: She is not in acute distress.    Appearance: Normal  appearance. She is well-developed.  HENT:     Head: Normocephalic and atraumatic.     Right Ear: Hearing normal.     Left Ear: Hearing normal.     Nose: Nose normal.  Eyes:     Conjunctiva/sclera: Conjunctivae normal.     Pupils: Pupils are equal, round, and reactive to light.  Cardiovascular:     Rate and Rhythm: Regular rhythm.     Heart sounds: S1 normal and S2 normal. No murmur heard. No friction rub. No gallop.   Pulmonary:     Effort: Pulmonary effort is normal. No respiratory distress.     Breath sounds: Normal breath sounds.  Chest:     Chest wall: No tenderness.  Abdominal:     General: Bowel sounds are normal.     Palpations: Abdomen is soft.     Tenderness: There is no abdominal tenderness. There is no guarding or rebound. Negative signs include Murphy's sign and McBurney's sign.     Hernia: No hernia is present.  Musculoskeletal:        General: Normal range of motion.     Cervical back: Normal range of motion and neck supple.     Right lower leg: Edema present.     Left lower leg: Edema present.  Skin:    General: Skin is warm and dry.     Findings: No rash.  Neurological:     Mental Status: She is alert and oriented to person, place, and time.     GCS: GCS eye subscore is 4. GCS verbal subscore is 5. GCS motor subscore is 6.     Cranial Nerves: No cranial nerve deficit.     Sensory: No sensory deficit.     Coordination: Coordination normal.  Psychiatric:        Speech: Speech normal.        Behavior: Behavior normal.        Thought Content: Thought content normal.  ED Results / Procedures / Treatments   Labs (all labs ordered are listed, but only abnormal results are displayed) Labs Reviewed  CBC WITH DIFFERENTIAL/PLATELET - Abnormal; Notable for the following components:      Result Value   RBC 2.52 (*)    Hemoglobin 7.3 (*)    HCT 24.4 (*)    MCHC 29.9 (*)    RDW 17.7 (*)    Abs Immature Granulocytes 0.08 (*)    All other components within  normal limits  COMPREHENSIVE METABOLIC PANEL - Abnormal; Notable for the following components:   Potassium 3.4 (*)    Calcium 8.8 (*)    Albumin 2.7 (*)    All other components within normal limits  URINALYSIS, ROUTINE W REFLEX MICROSCOPIC - Abnormal; Notable for the following components:   APPearance CLOUDY (*)    Hgb urine dipstick LARGE (*)    Protein, ur 100 (*)    Leukocytes,Ua MODERATE (*)    RBC / HPF >50 (*)    WBC, UA >50 (*)    Bacteria, UA RARE (*)    All other components within normal limits  D-DIMER, QUANTITATIVE - Abnormal; Notable for the following components:   D-Dimer, Quant 1.22 (*)    All other components within normal limits  URINE CULTURE  TROPONIN I (HIGH SENSITIVITY)  TROPONIN I (HIGH SENSITIVITY)    EKG EKG Interpretation  Date/Time:  Tuesday September 05 2020 21:37:58 EDT Ventricular Rate:  81 PR Interval:  187 QRS Duration: 100 QT Interval:  371 QTC Calculation: 431 R Axis:   19 Text Interpretation: Sinus rhythm Confirmed by Orpah Greek [phone]) on 09/05/2020 10:51:07 PM   Radiology DG Chest 2 View  Result Date: 09/06/2020 CLINICAL DATA:  Chest pain EXAM: CHEST - 2 VIEW COMPARISON:  08/21/2020 FINDINGS: The heart size and mediastinal contours are within normal limits. Both lungs are clear. The visualized skeletal structures are unremarkable. IMPRESSION: No active cardiopulmonary disease. Electronically Signed   By: Donavan Foil M.D.   On: 09/06/2020 00:43    Procedures Procedures   Medications Ordered in ED Medications  cefdinir (OMNICEF) capsule 300 mg (has no administration in time range)    ED Course  I have reviewed the triage vital signs and the nursing notes.  Pertinent labs & imaging results that were available during my care of the patient were reviewed by me and considered in my medical decision making (see chart for details).    MDM Rules/Calculators/A&P                          Patient presents to the emergency  department primarily for leg swelling.  Patient with normal blood pressure, doubt preeclampsia.  Normal liver enzymes.  Normal platelets.  Patient does have low albumin in her blood which is chronic for her.  Albumin is actually slightly higher than normal for her.  She also has protein in her urine.  This has been seen intermittently through her pregnancy as well.  This will need to be followed up, will refer her back to her OB/GYN for recheck.  Leg swelling is minimal and bilateral.  Doubt DVT but will schedule outpatient venous ultrasound for tomorrow.  She did have an elevated D-dimer which is chronic for her and not surprising after her surgery, pregnancy and spinal abscess.  Patient reports fleeting chest pains intermittently but nothing that has been continuous or pleuritic.  She is not hypoxic, tachycardic.  Lungs are clear  on exam.  Chest x-ray is clear.  Doubt PE as a cause of this fleeting intermittent chest pain.  Discussed risks and benefits of CT scan with patient, will defer.  Return for any difficulty breathing.  Urinalysis does suggest infection.  She is currently on IV daptomycin for her spinal infection.  Will add gram-negative coverage.  Final Clinical Impression(s) / ED Diagnoses Final diagnoses:  Pedal edema  Acute cystitis without hematuria    Rx / DC Orders ED Discharge Orders         Ordered    cefdinir (OMNICEF) 300 MG capsule  2 times daily        09/06/20 0152    US Venous Img Lower Bilateral        09/06/20 0152           Orpah Greek, MD 09/06/20 [phone]

## 2020-09-05 NOTE — ED Triage Notes (Signed)
Had a c section on 04/06.  Having swelling to bilateral ankles, headaches, lt arm pain and chest pain to LT side that started yesterday.

## 2020-09-06 ENCOUNTER — Telehealth: Payer: Self-pay | Admitting: Obstetrics & Gynecology

## 2020-09-06 DIAGNOSIS — G062 Extradural and subdural abscess, unspecified: Secondary | ICD-10-CM | POA: Diagnosis not present

## 2020-09-06 DIAGNOSIS — R079 Chest pain, unspecified: Secondary | ICD-10-CM | POA: Diagnosis not present

## 2020-09-06 LAB — URINALYSIS, ROUTINE W REFLEX MICROSCOPIC
Bilirubin Urine: NEGATIVE
Glucose, UA: NEGATIVE mg/dL
Ketones, ur: NEGATIVE mg/dL
Nitrite: NEGATIVE
Protein, ur: 100 mg/dL — AB
RBC / HPF: >50 RBC/hpf — ABNORMAL HIGH (ref 0–5)
Specific Gravity, Urine: 1.024 (ref 1.005–1.030)
WBC, UA: >50 WBC/hpf — ABNORMAL HIGH (ref 0–5)
pH: 6 (ref 5.0–8.0)

## 2020-09-06 LAB — COMPREHENSIVE METABOLIC PANEL
ALT: 19 U/L (ref 0–44)
AST: 19 U/L (ref 15–41)
Albumin: 2.7 g/dL — ABNORMAL LOW (ref 3.5–5.0)
Alkaline Phosphatase: 81 U/L (ref 38–126)
Anion gap: 13 (ref 5–15)
BUN: 15 mg/dL (ref 6–20)
CO2: 24 mmol/L (ref 22–32)
Calcium: 8.8 mg/dL — ABNORMAL LOW (ref 8.9–10.3)
Chloride: 105 mmol/L (ref 98–111)
Creatinine, Ser: 0.64 mg/dL (ref 0.44–1.00)
GFR, Estimated: >60 mL/min (ref >60)
Glucose, Bld: 87 mg/dL (ref 70–99)
Potassium: 3.4 mmol/L — ABNORMAL LOW (ref 3.5–5.1)
Sodium: 142 mmol/L (ref 135–145)
Total Bilirubin: 0.5 mg/dL (ref 0.3–1.2)
Total Protein: 6.5 g/dL (ref 6.5–8.1)

## 2020-09-06 LAB — CBC WITH DIFFERENTIAL/PLATELET
Abs Immature Granulocytes: 0.08 10*3/uL — ABNORMAL HIGH (ref 0.00–0.07)
Basophils Absolute: 0 10*3/uL (ref 0.0–0.1)
Basophils Relative: 0 %
Eosinophils Absolute: 0.1 10*3/uL (ref 0.0–0.5)
Eosinophils Relative: 2 %
HCT: 24.4 % — ABNORMAL LOW (ref 36.0–46.0)
Hemoglobin: 7.3 g/dL — ABNORMAL LOW (ref 12.0–15.0)
Immature Granulocytes: 1 %
Lymphocytes Relative: 39 %
Lymphs Abs: 2.3 10*3/uL (ref 0.7–4.0)
MCH: 29 pg (ref 26.0–34.0)
MCHC: 29.9 g/dL — ABNORMAL LOW (ref 30.0–36.0)
MCV: 96.8 fL (ref 80.0–100.0)
Monocytes Absolute: 0.3 10*3/uL (ref 0.1–1.0)
Monocytes Relative: 6 %
Neutro Abs: 3 10*3/uL (ref 1.7–7.7)
Neutrophils Relative %: 52 %
Platelets: 299 10*3/uL (ref 150–400)
RBC: 2.52 MIL/uL — ABNORMAL LOW (ref 3.87–5.11)
RDW: 17.7 % — ABNORMAL HIGH (ref 11.5–15.5)
WBC: 5.9 10*3/uL (ref 4.0–10.5)
nRBC: 0 % (ref 0.0–0.2)

## 2020-09-06 LAB — TROPONIN I (HIGH SENSITIVITY)
Troponin I (High Sensitivity): <2 ng/L (ref <18)
Troponin I (High Sensitivity): <2 ng/L (ref <18)

## 2020-09-06 MED ORDER — CEFDINIR 300 MG PO CAPS
300.0000 mg | ORAL_CAPSULE | ORAL | Status: AC
  Administered 2020-09-06: 300 mg via ORAL
  Filled 2020-09-06: qty 1

## 2020-09-06 MED ORDER — CEFDINIR 300 MG PO CAPS: 300.0000 mg | ORAL_CAPSULE | Freq: Two times a day (BID) | ORAL | 0 refills | Status: DC

## 2020-09-06 NOTE — Telephone Encounter (Signed)
It was reported on the after hours line 7:29pm 09/05/20 having a lot of swelling in her feet and legs. Face is also very hot. Clinical staff will follow up with patient.

## 2020-09-06 NOTE — Telephone Encounter (Signed)
Pt seen in the ER for this concern.

## 2020-09-06 NOTE — Discharge Instructions (Signed)
Schedule follow-up with your OB/GYN for a recheck of your swelling in 1 week.  Return to the emergency department if you have any difficulty breathing.  Come back tomorrow for the ultrasound of the legs to make sure you do not have a blood clot.

## 2020-09-07 DIAGNOSIS — G062 Extradural and subdural abscess, unspecified: Secondary | ICD-10-CM | POA: Diagnosis not present

## 2020-09-08 DIAGNOSIS — G062 Extradural and subdural abscess, unspecified: Secondary | ICD-10-CM | POA: Diagnosis not present

## 2020-09-08 LAB — URINE CULTURE: Culture: 10000 — AB

## 2020-09-09 DIAGNOSIS — G062 Extradural and subdural abscess, unspecified: Secondary | ICD-10-CM | POA: Diagnosis not present

## 2020-09-10 DIAGNOSIS — G062 Extradural and subdural abscess, unspecified: Secondary | ICD-10-CM | POA: Diagnosis not present

## 2020-09-11 DIAGNOSIS — G062 Extradural and subdural abscess, unspecified: Secondary | ICD-10-CM | POA: Diagnosis not present

## 2020-09-11 DIAGNOSIS — M4624 Osteomyelitis of vertebra, thoracic region: Secondary | ICD-10-CM | POA: Diagnosis not present

## 2020-09-12 DIAGNOSIS — G062 Extradural and subdural abscess, unspecified: Secondary | ICD-10-CM | POA: Diagnosis not present

## 2020-09-13 DIAGNOSIS — G062 Extradural and subdural abscess, unspecified: Secondary | ICD-10-CM | POA: Diagnosis not present

## 2020-09-14 DIAGNOSIS — G062 Extradural and subdural abscess, unspecified: Secondary | ICD-10-CM | POA: Diagnosis not present

## 2020-09-15 ENCOUNTER — Inpatient Hospital Stay: Payer: Medicaid Other | Admitting: Internal Medicine

## 2020-09-15 DIAGNOSIS — G062 Extradural and subdural abscess, unspecified: Secondary | ICD-10-CM | POA: Diagnosis not present

## 2020-09-15 NOTE — Progress Notes (Deleted)
4/18 - Cr 0.82, Hgb 9.6, ESR 26, CRP 6, CK 148   Mri t and l spine

## 2020-09-16 DIAGNOSIS — G062 Extradural and subdural abscess, unspecified: Secondary | ICD-10-CM | POA: Diagnosis not present

## 2020-09-17 DIAGNOSIS — G062 Extradural and subdural abscess, unspecified: Secondary | ICD-10-CM | POA: Diagnosis not present

## 2020-09-18 DIAGNOSIS — G062 Extradural and subdural abscess, unspecified: Secondary | ICD-10-CM | POA: Diagnosis not present

## 2020-09-19 ENCOUNTER — Other Ambulatory Visit (HOSPITAL_COMMUNITY): Payer: Self-pay

## 2020-09-19 DIAGNOSIS — M4624 Osteomyelitis of vertebra, thoracic region: Secondary | ICD-10-CM | POA: Diagnosis not present

## 2020-09-19 DIAGNOSIS — G062 Extradural and subdural abscess, unspecified: Secondary | ICD-10-CM | POA: Diagnosis not present

## 2020-09-20 DIAGNOSIS — G062 Extradural and subdural abscess, unspecified: Secondary | ICD-10-CM | POA: Diagnosis not present

## 2020-09-21 DIAGNOSIS — G062 Extradural and subdural abscess, unspecified: Secondary | ICD-10-CM | POA: Diagnosis not present

## 2020-09-22 DIAGNOSIS — G062 Extradural and subdural abscess, unspecified: Secondary | ICD-10-CM | POA: Diagnosis not present

## 2020-09-23 DIAGNOSIS — G062 Extradural and subdural abscess, unspecified: Secondary | ICD-10-CM | POA: Diagnosis not present

## 2020-09-24 DIAGNOSIS — G062 Extradural and subdural abscess, unspecified: Secondary | ICD-10-CM | POA: Diagnosis not present

## 2020-09-25 ENCOUNTER — Encounter: Payer: Self-pay | Admitting: Internal Medicine

## 2020-09-25 DIAGNOSIS — G062 Extradural and subdural abscess, unspecified: Secondary | ICD-10-CM | POA: Diagnosis not present

## 2020-09-25 DIAGNOSIS — M4624 Osteomyelitis of vertebra, thoracic region: Secondary | ICD-10-CM | POA: Diagnosis not present

## 2020-09-26 DIAGNOSIS — G062 Extradural and subdural abscess, unspecified: Secondary | ICD-10-CM | POA: Diagnosis not present

## 2020-09-27 ENCOUNTER — Other Ambulatory Visit: Payer: Self-pay

## 2020-09-27 ENCOUNTER — Ambulatory Visit (INDEPENDENT_AMBULATORY_CARE_PROVIDER_SITE_OTHER): Payer: Medicaid Other | Admitting: Internal Medicine

## 2020-09-27 ENCOUNTER — Encounter: Payer: Self-pay | Admitting: Internal Medicine

## 2020-09-27 VITALS — Temp 97.7°F | Wt 225.0 lb

## 2020-09-27 DIAGNOSIS — M4620 Osteomyelitis of vertebra, site unspecified: Secondary | ICD-10-CM | POA: Diagnosis not present

## 2020-09-27 DIAGNOSIS — B9562 Methicillin resistant Staphylococcus aureus infection as the cause of diseases classified elsewhere: Secondary | ICD-10-CM | POA: Diagnosis not present

## 2020-09-27 DIAGNOSIS — G062 Extradural and subdural abscess, unspecified: Secondary | ICD-10-CM

## 2020-09-27 DIAGNOSIS — R7881 Bacteremia: Secondary | ICD-10-CM | POA: Diagnosis not present

## 2020-09-27 MED ORDER — LORAZEPAM 1 MG PO TABS: 1.0000 mg | ORAL_TABLET | Freq: Once | ORAL | 0 refills | Status: AC

## 2020-09-27 NOTE — Patient Instructions (Signed)
Thank you for coming to see me today. It was a pleasure seeing you.  To Do: Marland Kitchen Continue daily daptomycin via PICC line through 10/18/20 . Repeat MRI of spine . Echocardiogram of heart . Referral to neurosurgery . Follow up with me prior to 10/18/20  If you have any questions or concerns, please do not hesitate to call the office at [phone].  Take Care,   Jule Ser, DO

## 2020-09-27 NOTE — Progress Notes (Signed)
[facility]  Reason for Consult: Hospital follow-up for vertebral osteomyelitis, epidural abscess, MRSA bacteremia  Referring Provider: Dr. Gale Journey   HPI:    Colleen Valentine is a 25 y.o. female with PMHx as below who presents to the clinic for hospital follow-up for epidural abscess/osteomyelitis and MRSA bacteremia.   She was recently hospitalized 3/28 through 09/03/2020 after presenting at 35 weeks, 3 days for back pain and weakness.  She was found to have epidural abscess spanning from T10-L3.  She underwent laminotomies with decompression of the epidural abscess with neurosurgery on 08/23/2020.  OR cultures grew MRSA.  She also underwent cesarean section on 08/30/2020.  She was seen by my partner, Dr. Gale Journey, during her hospitalization who recommended daptomycin 8 mg/kg for 8 weeks from the date of her epidural abscess surgery (end date of 10/18/2020) with repeat MRI midway through her course of antibiotics.  She presents today for follow-up.  Since hospital discharge she was seen in the ER on 09/05/2020 for leg swelling.  Work-up was unremarkable and she was discharged home at that time.  It appears that an Echo was ordered inpatient however no result are available.   She has been tolerating daptomycin without issues and PICC line is working well.  She continues to have some back pain but is not worsening or having neurologic symptoms.  No fevers or chills.  She is recovering from her pregnancy.  She has yet to see NSGY since discharge.   Recent OP AT labs from 5/2: Creatinine 0.71, WBC 5.5, ESR 20, CRP 10, CK 72.  Patient's Medications  New Prescriptions   LORAZEPAM (ATIVAN) 1 MG TABLET    Take 1 tablet (1 mg total) by mouth once for 1 dose. Take prior to MRI for anxiety as needed.  Previous Medications   ACETAMINOPHEN (TYLENOL) 500 MG TABLET    Take 1,000 mg by mouth every 6 (six) hours as needed for mild pain or headache.   CEFDINIR (OMNICEF) 300 MG CAPSULE    Take 1  capsule (300 mg total) by mouth 2 (two) times daily.   CETIRIZINE (ZYRTEC) 10 MG TABLET    Take 1 tablet (10 mg total) by mouth daily.   DAPTOMYCIN (CUBICIN) IVPB    Inject 900 mg into the vein daily. Indication:  Epidural abscess First Dose: No Last Day of Therapy:  10/18/2020 Labs - Once weekly:  CBC/D, BMP, and CPK Labs - Every other week:  ESR and CRP Method of administration: IV Push Method of administration may be changed at the discretion of home infusion pharmacist based upon assessment of the patient and/or caregiver's ability to self-administer the medication ordered.   ENOXAPARIN (LOVENOX) 60 MG/0.6ML INJECTION    Inject 0.6 mLs (60 mg total) into the skin daily.   HYDROXYZINE (VISTARIL) 25 MG CAPSULE    Take 1 capsule (25 mg total) by mouth daily as needed for anxiety.   LAMOTRIGINE (LAMICTAL) 25 MG TABLET    Take 25 mg by mouth 2 (two) times daily.   OMEPRAZOLE (PRILOSEC) 20 MG CAPSULE    Take 20 mg by mouth daily.   PRENATAL VIT-FE FUMARATE-FA (PRENATAL MULTIVITAMIN) TABS TABLET    Take 1 tablet by mouth daily at 12 noon.   SENNA-DOCUSATE (SENOKOT-S) 8.6-50 MG TABLET    Take 2 tablets by mouth daily for 30 doses.   VALACYCLOVIR (VALTREX) 500 MG TABLET    Take 500 mg by mouth daily.  Modified Medications   No  medications on file  Discontinued Medications   No medications on file      Past Medical History:  Diagnosis Date  . Acute pyelonephritis 05/17/2018  . Anemia   . Anxiety   . Complication of anesthesia   . Depression   . GERD (gastroesophageal reflux disease)   . Herpes genitalia   . Insomnia   . Obesity   . Polysubstance abuse ([place]) 04/01/2011  . PONV (postoperative nausea and vomiting)   . Seizures ([place])    once in 2016 due to alcohol poisioning  . Suicide [facility])     Social History   Tobacco Use  . Smoking status: Former Smoker    Packs/day: 0.50    Years: 5.00    Pack years: 2.50    Types: Cigarettes    Quit date: 11/24/2019    Years since quitting:  0.8  . Smokeless tobacco: Never Used  . Tobacco comment: one pack cigarettes daily  Vaping Use  . Vaping Use: Some days  Substance Use Topics  . Alcohol use: Not Currently    Alcohol/week: 0.0 standard drinks    Comment: social  . Drug use: Yes    Types: Marijuana    Comment: SMOKED TODAY    Family History  Problem Relation Age of Onset  . Alcohol abuse Mother   . Stroke Mother   . Bipolar disorder Mother   . Drug abuse Mother   . Alcohol abuse Father   . Bipolar disorder Father   . Drug abuse Father   . Colon cancer Maternal Grandfather   . Crohn's disease Paternal Grandmother   . Crohn's disease Paternal Aunt   . Crohn's disease Paternal Aunt   . Crohn's disease Paternal Uncle   . Colon cancer Paternal Uncle   . Cancer Maternal Aunt        "stomach cancer"  . Celiac disease Neg Hx     No Known Allergies  Review of Systems  Constitutional: Negative for chills and fever.  Respiratory: Negative.   Cardiovascular: Negative.   Musculoskeletal: Positive for back pain.  All other systems reviewed and are negative.     OBJECTIVE:    Vitals:   09/27/20 1403  Temp: 97.7 F (36.5 C)  TempSrc: Oral  Weight: 225 lb (102.1 kg)     Body mass index is 37.44 kg/m.  Physical Exam Constitutional:      General: She is not in acute distress.    Appearance: Normal appearance.  Cardiovascular:     Rate and Rhythm: Normal rate and regular rhythm.     Heart sounds: No murmur heard.   Pulmonary:     Effort: Pulmonary effort is normal. No respiratory distress.     Breath sounds: Normal breath sounds.  Musculoskeletal:        General: Tenderness present.     Comments: Midline lower back incision is clean and dry without erythema or dehiscence.  Mild TTP over surgical area.   Skin:    General: Skin is warm and dry.  Neurological:     General: No focal deficit present.     Mental Status: She is alert and oriented to person, place, and time.     Motor: No weakness.   Psychiatric:        Mood and Affect: Mood normal.        Behavior: Behavior normal.      Labs and Microbiology:  CBC Latest Ref Rng & Units 09/05/2020 08/31/2020 08/28/2020  WBC 4.0 - 10.5  K/uL 5.9 9.2 6.1  Hemoglobin 12.0 - 15.0 g/dL 7.3(L) 7.2(L) 8.7(L)  Hematocrit 36.0 - 46.0 % 24.4(L) 22.2(L) 26.7(L)  Platelets 150 - 400 K/uL 299 256 270   CMP Latest Ref Rng & Units 09/05/2020 08/26/2020 08/24/2020  Glucose 70 - 99 mg/dL 87 - -  BUN 6 - 20 mg/dL 15 - -  Creatinine 0.44 - 1.00 mg/dL 0.64 0.52 0.55  Sodium 135 - 145 mmol/L 142 - -  Potassium 3.5 - 5.1 mmol/L 3.4(L) - -  Chloride 98 - 111 mmol/L 105 - -  CO2 22 - 32 mmol/L 24 - -  Calcium 8.9 - 10.3 mg/dL 8.8(L) - -  Total Protein 6.5 - 8.1 g/dL 6.5 - -  Total Bilirubin 0.3 - 1.2 mg/dL 0.5 - -  Alkaline Phos 38 - 126 U/L 81 - -  AST 15 - 41 U/L 19 - -  ALT 0 - 44 U/L 19 - -     No results found for this or any previous visit (from the past 240 hour(s)).  Imaging: IMPRESSION: The patient's posterior epidural lesion extends from T10-L3 and infiltrates the left more than right foramina with left more than right paravertebral and paraspinous involvement. The left T11-12 facet is eroded but no regional marrow edema as would be expected for an inciting septic arthritis. It is uncertain if this is tumor or purulent collection.  IMPRESSION: 1. 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 The superior collection is favored epidural in location and the inferior is favored to be subdural in location. The superior collection is heterogeneous, indeterminate but possibly hemorrhage given recent trauma. The inferior collection is more homogeneous, indeterminate but possibly CSF. When combined with a posterior disc bulge, there is severe canal stenosis at L3-L4. There is moderate to  severe stenosis at T11-T12 through L2-L3 and moderate canal stenosis at L4-L5. Unfortunately, the patient cannot receive contrast to further characterize given pregnancy. 2. Abnormal edema in the left paraspinal soft tissues in the lower thoracic spine. This may represent contusion/strain, but is incompletely imaged on this study. Recommend MRI of the thoracic spine to further evaluate this finding, evaluate for any other traumatic findings, and characterize the cranial extent of the canal collection.     ASSESSMENT & PLAN:    1. Epidural abscess  2. MRSA bacteremia  3. Vertebral osteomyelitis ([place])  -- continue with Daptomycin 966m IV q24h through 10/18/20 -- repeat MRI thoracic and lumbar spine.  Will prescribe 1 time dose of Ativan for anxiety prior to procedure -- Echo -- continue weekly OPAT labs -- RTC 3 weeks   ARaynelle Highlandfor Infectious Disease [facility] 09/27/2020, 2:36 PM  I spent 40 minutes dedicated to the care of this patient on the date of this encounter to include pre-visit review of records, face-to-face time with the patient discussing MRSA, epidural abscess, and post-visit ordering of testing.

## 2020-09-28 DIAGNOSIS — G062 Extradural and subdural abscess, unspecified: Secondary | ICD-10-CM | POA: Diagnosis not present

## 2020-09-29 DIAGNOSIS — G062 Extradural and subdural abscess, unspecified: Secondary | ICD-10-CM | POA: Diagnosis not present

## 2020-09-30 DIAGNOSIS — G062 Extradural and subdural abscess, unspecified: Secondary | ICD-10-CM | POA: Diagnosis not present

## 2020-10-01 DIAGNOSIS — G062 Extradural and subdural abscess, unspecified: Secondary | ICD-10-CM | POA: Diagnosis not present

## 2020-10-02 DIAGNOSIS — G062 Extradural and subdural abscess, unspecified: Secondary | ICD-10-CM | POA: Diagnosis not present

## 2020-10-03 DIAGNOSIS — M4624 Osteomyelitis of vertebra, thoracic region: Secondary | ICD-10-CM | POA: Diagnosis not present

## 2020-10-03 DIAGNOSIS — G062 Extradural and subdural abscess, unspecified: Secondary | ICD-10-CM | POA: Diagnosis not present

## 2020-10-04 ENCOUNTER — Ambulatory Visit (INDEPENDENT_AMBULATORY_CARE_PROVIDER_SITE_OTHER): Payer: Medicaid Other | Admitting: Women's Health

## 2020-10-04 ENCOUNTER — Encounter: Payer: Self-pay | Admitting: Women's Health

## 2020-10-04 ENCOUNTER — Other Ambulatory Visit: Payer: Self-pay

## 2020-10-04 DIAGNOSIS — Z3009 Encounter for other general counseling and advice on contraception: Secondary | ICD-10-CM | POA: Diagnosis not present

## 2020-10-04 DIAGNOSIS — F4310 Post-traumatic stress disorder, unspecified: Secondary | ICD-10-CM | POA: Diagnosis not present

## 2020-10-04 DIAGNOSIS — F418 Other specified anxiety disorders: Secondary | ICD-10-CM | POA: Diagnosis not present

## 2020-10-04 DIAGNOSIS — G062 Extradural and subdural abscess, unspecified: Secondary | ICD-10-CM | POA: Diagnosis not present

## 2020-10-04 NOTE — Patient Instructions (Signed)
You will have your sugar test next visit.  Please do not eat or drink anything after midnight the night before you come, not even water.  You will be here for at least two hours.  Please make an appointment online for the bloodwork at [email or web address] for 8:30am (or as close to this as possible). Make sure you select the [address] service center. The day of the appointment, check in with our office first, then you will go to [facility] to start the sugar test.   

## 2020-10-04 NOTE — Progress Notes (Signed)
POSTPARTUM VISIT Patient name: Colleen Valentine MRN [id number]  Date of birth: August 26, 1995 Chief Complaint:   Postpartum Care  History of Present Illness:   Colleen Valentine is a 25 y.o. [phone] Caucasian female being seen today for a postpartum visit. She is 5 weeks postpartum following a primary cesarean section, low transverse incision on 4/6 at 35.3 gestational weeks d/t PPROM/breech presentation while in hospital for epidural abscess T10-L3, osteomyelitis, MRSA bacteremia.  Anesthesia: general.  Laceration: n/a.  Complications: none. Inpatient contraception: no.   S/P laminotomies, decompression of epidural abscess and irrigation of epidural space on 3/30. She has PICC line and is currently on IV Daptomycin, last dose supposed to be 5/25.  Last saw ID on 5/4. Has MRI and appt w/ Dr. Ellene Route tomorrow. Next appt w/ ID on 5/25. She received prenatal [facility].  Pregnancy complicated by R4WN and epidural abscess. Tobacco use: former . Substance use disorder: no. Last pap smear: never and results were N/A. Next pap smear due: now, pt declines today, wants to do next visit No LMP recorded. (Menstrual status: Lactating).  Postpartum course has been complicated by IV therapy. Bleeding none. Bowel function is normal. Bladder function is normal. Urinary incontinence? no, fecal incontinence? no Patient is not sexually active. Last sexual activity: prior to birth of baby. Desired contraception: none, is a lesbian. May want to get on birth control if periods are heavy/irregular like they have been in the past. She will let us know.   Upstream - 10/04/20 1346      Pregnancy Intention Screening   Does the patient want to become pregnant in the next year? No    Does the patient's partner want to become pregnant in the next year? No    Would the patient like to discuss contraceptive options today? No      Contraception Wrap Up   Current Method Abstinence    End Method Abstinence    Contraception  Counseling Provided No          The pregnancy intention screening data noted above was reviewed. Potential methods of contraception were discussed. The patient elected to proceed with Abstinence from sex w/ men.  Edinburgh Postpartum Depression Screening: Has h/o mental illness (dep/anx) since 25yo, has been on multiple meds. Currently on lamictal. Feels anxiety is very high. Feels she has PTSD from events during pregnancy. Denies SI/HI/II. Does not have current therapist. Sees PCP tomorrow to discuss med management. Is OK w/ referral to IBH.   Edinburgh Postnatal Depression Scale - 10/04/20 1346      Edinburgh Postnatal Depression Scale:  In the Past 7 Days   I have been able to laugh and see the funny side of things. 3    I have looked forward with enjoyment to things. 3    I have blamed myself unnecessarily when things went wrong. 3    I have been anxious or worried for no good reason. 3    I have felt scared or panicky for no good reason. 3    Things have been getting on top of me. 3    I have been so unhappy that I have had difficulty sleeping. 2    I have felt sad or miserable. 3    I have been so unhappy that I have been crying. 3    The thought of harming myself has occurred to me. 0    Edinburgh Postnatal Depression Scale Total 26  Baby's course has been uncomplicated. Baby is feeding by breast: milk supply inadequate . Infant has a pediatrician/family doctor? Yes.  Childcare strategy if returning to work/school: family.  Pt has material needs met for her and baby: Yes.   Review of Systems:   Pertinent items are noted in HPI Denies Abnormal vaginal discharge w/ itching/odor/irritation, headaches, visual changes, shortness of breath, chest pain, abdominal pain, severe nausea/vomiting, or problems with urination or bowel movements. Pertinent History Reviewed:  Reviewed past medical,surgical, obstetrical and family history.  Reviewed problem list, medications and  allergies. OB History  Gravida Para Term Preterm AB Living  '5 2 1 1 3 2  ' SAB IAB Ectopic Multiple Live Births  2 1   0 2    # Outcome Date GA Lbr Len/2nd Weight Sex Delivery Anes PTL Lv  5 Preterm 08/30/20 [redacted]w[redacted]d 6 lb 7.4 oz (2.93 kg) F CS-LTranv Gen  LIV  4 SAB 2019          3 SAB 2015          2 IAB           1 Term      Vag-Spont   LIV   Physical Assessment:   Vitals:   10/04/20 1343  BP: 115/79  Pulse: 81  Weight: 230 lb (104.3 kg)  Height: '5\' 5"'  (1.651 m)  Body mass index is 38.27 kg/m.       Physical Examination:   General appearance: alert, well appearing, and in no distress  Mental status: alert, oriented to person, place, and time  Skin: warm & dry   Cardiovascular: normal heart rate noted   Respiratory: normal respiratory effort, no distress   Breasts: deferred, no complaints   Abdomen: soft, non-tender, c/s incision healing well, no s/s infection  Back: incision healing well, no s/s infection   Pelvic: examination not indicated. Thin prep pap obtained: No-declined, wants next visit  Rectal: not examined  Extremities: Edema: none   Chaperone: N/A         No results found for this or any previous visit (from the past 24 hour(s)).  Assessment & Plan:  1) Postpartum exam 2) 5 wks s/p primary cesarean section, low transverse incision for PPROM/breech 3) breast & bottle feeding 4) Depression screening 5) Contraception counseling- lesbian, doesn't plan to have sex w/ men. Will let [place] if she decides for birth control for period management as she has used in past 6) GDM during pregnancy> schedule 2hr GTT 7) Dep/anx/PTSD> continue Lamictal, discuss meds w/ PCP tomorrow as scheduled. IBH referral ordered.  Essential components of care per ACOG recommendations:  1.  Mood and well being:  . If positive depression screen, discussed and plan developed.  . If using tobacco we discussed reduction/cessation and risk of relapse . If current substance abuse, we  discussed and referral to local resources was offered.   2. Infant care and feeding:  . If breastfeeding, discussed returning to work, pumping, breastfeeding-associated pain, guidance regarding return to fertility while lactating if not using another method. If needed, patient was provided with a letter to be allowed to pump q 2-3hrs to support lactation in a private location with access to a refrigerator to store breastmilk.   . Recommended that all caregivers be immunized for flu, pertussis and other preventable communicable diseases . If pt does not have material needs met for her/baby, referred to local resources for help obtaining these.  3. Sexuality, contraception and birth spacing .  Provided guidance regarding sexuality, management of dyspareunia, and resumption of intercourse . Discussed avoiding interpregnancy interval <23mhs and recommended birth spacing of 18 months  4. Sleep and fatigue . Discussed coping options for fatigue and sleep disruption . Encouraged family/partner/community support of 4 hrs of uninterrupted sleep to help with mood and fatigue  5. Physical recovery  . If pt had a C/S, assessed incisional pain and providing guidance on normal vs prolonged recovery . If pt had a laceration, perineal healing and pain reviewed.  . If urinary or fecal incontinence, discussed management and referred to PT or uro/gyn if indicated  . Patient is safe to resume physical activity. Discussed attainment of healthy weight.  6.  Chronic disease management . Discussed pregnancy complications if any, and their implications for future childbearing and long-term maternal health. . Review recommendations for prevention of recurrent pregnancy complications, such as 17 hydroxyprogesterone caproate to reduce risk for recurrent PTB yes, or aspirin to reduce risk of preeclampsia not applicable. . Pt had GDM: yes. If yes, 2hr GTT scheduled: yes. . Reviewed medications and non-pregnant dosing  including consideration of whether pt is breastfeeding using a reliable resource such as LactMed: yes . Referred for f/u w/ PCP or subspecialist providers as indicated: keep appts as scheduled  7. Health maintenance . Mammogram at [phone] earlier if indicated . Pap smears as indicated  Meds: No orders of the defined types were placed in this encounter.   Follow-up: Return in about 3 weeks (around 10/25/2020) for Pap & physical & 2hr sugar test.   Orders Placed This Encounter  Procedures  . [facility] 4:57 PM

## 2020-10-05 ENCOUNTER — Ambulatory Visit (HOSPITAL_COMMUNITY)
Admission: RE | Admit: 2020-10-05 | Discharge: 2020-10-05 | Disposition: A | Discharge status: Home / Self Care | Payer: Medicaid Other | Source: Ambulatory Visit | Attending: Internal Medicine | Admitting: Internal Medicine

## 2020-10-05 ENCOUNTER — Other Ambulatory Visit: Payer: Self-pay

## 2020-10-05 ENCOUNTER — Encounter: Payer: Self-pay | Admitting: Family

## 2020-10-05 ENCOUNTER — Ambulatory Visit (INDEPENDENT_AMBULATORY_CARE_PROVIDER_SITE_OTHER): Payer: Medicaid Other | Admitting: Family

## 2020-10-05 VITALS — BP 95/64 | HR 73 | Temp 97.7°F | Ht 65.0 in | Wt 231.4 lb

## 2020-10-05 DIAGNOSIS — M5126 Other intervertebral disc displacement, lumbar region: Secondary | ICD-10-CM | POA: Diagnosis not present

## 2020-10-05 DIAGNOSIS — F411 Generalized anxiety disorder: Secondary | ICD-10-CM

## 2020-10-05 DIAGNOSIS — G062 Extradural and subdural abscess, unspecified: Secondary | ICD-10-CM

## 2020-10-05 DIAGNOSIS — K219 Gastro-esophageal reflux disease without esophagitis: Secondary | ICD-10-CM | POA: Diagnosis not present

## 2020-10-05 DIAGNOSIS — M5127 Other intervertebral disc displacement, lumbosacral region: Secondary | ICD-10-CM | POA: Diagnosis not present

## 2020-10-05 DIAGNOSIS — F4310 Post-traumatic stress disorder, unspecified: Secondary | ICD-10-CM

## 2020-10-05 DIAGNOSIS — F17200 Nicotine dependence, unspecified, uncomplicated: Secondary | ICD-10-CM

## 2020-10-05 DIAGNOSIS — F331 Major depressive disorder, recurrent, moderate: Secondary | ICD-10-CM | POA: Diagnosis not present

## 2020-10-05 DIAGNOSIS — G061 Intraspinal abscess and granuloma: Secondary | ICD-10-CM | POA: Diagnosis not present

## 2020-10-05 DIAGNOSIS — M5137 Other intervertebral disc degeneration, lumbosacral region: Secondary | ICD-10-CM | POA: Diagnosis not present

## 2020-10-05 DIAGNOSIS — M545 Low back pain, unspecified: Secondary | ICD-10-CM

## 2020-10-05 DIAGNOSIS — A6000 Herpesviral infection of urogenital system, unspecified: Secondary | ICD-10-CM

## 2020-10-05 DIAGNOSIS — G9519 Other vascular myelopathies: Secondary | ICD-10-CM | POA: Diagnosis not present

## 2020-10-05 DIAGNOSIS — M5136 Other intervertebral disc degeneration, lumbar region: Secondary | ICD-10-CM | POA: Diagnosis not present

## 2020-10-05 MED ORDER — GADOBUTROL 1 MMOL/ML IV SOLN
10.0000 mL | Freq: Once | INTRAVENOUS | Status: AC | PRN
  Administered 2020-10-05: 10 mL via INTRAVENOUS

## 2020-10-05 MED ORDER — OMEPRAZOLE 20 MG PO CPDR: 20.0000 mg | DELAYED_RELEASE_CAPSULE | Freq: Every day | ORAL | 1 refills | Status: DC

## 2020-10-05 MED ORDER — VALACYCLOVIR HCL 500 MG PO TABS
500.0000 mg | ORAL_TABLET | Freq: Every day | ORAL | 1 refills | Status: DC
Start: 2020-10-05 — End: 2021-04-14

## 2020-10-05 MED ORDER — GABAPENTIN 100 MG PO CAPS: 100.0000 mg | ORAL_CAPSULE | Freq: Three times a day (TID) | ORAL | 3 refills | Status: DC

## 2020-10-05 MED ORDER — LAMOTRIGINE 100 MG PO TABS: 100.0000 mg | ORAL_TABLET | Freq: Every day | ORAL | 1 refills | Status: DC

## 2020-10-05 NOTE — Progress Notes (Signed)
Subjective:    Patient ID: Colleen Valentine, female    DOB: 10/12/1995, 25 y.o.   MRN: [id number]  Chief Complaint  Patient presents with  . Medical Management of Chronic Issues    Discuss wt loss wants phentermine. Lamictal not working wants a change.   . Gastroesophageal Reflux    Wants 40 mg of prilosec  . Back Pain   Pt presents to the office today for chronic follow up.  She is followed by GYN.  She had an epidural abscess and is followed by Neurosurgery. She has a PICC line and takes daptomycin.   She is followed by ID to monitor this.   She has a referral for [facility] pending. Her GYN placed this. She is having increased GAD and Depression. She has hx of PTSD.  Gastroesophageal Reflux She complains of belching and heartburn. This is a chronic problem. The current episode started more than 1 year ago. The problem occurs occasionally. The symptoms are aggravated by smoking and certain foods. Associated symptoms include fatigue. She has tried a PPI for the symptoms. The treatment provided moderate relief.  Back Pain This is a chronic problem. The current episode started more than 1 year ago. The problem occurs intermittently. The problem has been waxing and waning since onset. The pain is present in the lumbar spine. The pain is at a severity of 9/10. The pain is moderate. Risk factors include obesity. Treatments tried: tylenol. The treatment provided mild relief.  Anxiety Presents for follow-up visit. Symptoms include depressed mood, excessive worry, insomnia, irritability, nervous/anxious behavior and restlessness. Symptoms occur most days. The severity of symptoms is moderate.    Depression        This is a chronic problem.  The current episode started more than 1 year ago.   The onset quality is gradual.   The problem occurs constantly.  Associated symptoms include fatigue, helplessness, hopelessness, insomnia, restlessness, decreased interest and sad.  Past medical history  includes anxiety.   Genital Herpes  Takes Valtrex daily. Last flare up was March.     Review of Systems  Constitutional: Positive for fatigue and irritability.  Gastrointestinal: Positive for heartburn.  Musculoskeletal: Positive for back pain.  Psychiatric/Behavioral: Positive for depression. The patient is nervous/anxious and has insomnia.   All other systems reviewed and are negative.      Objective:   Physical Exam Vitals reviewed.  Constitutional:      General: She is not in acute distress.    Appearance: She is well-developed. She is obese.  HENT:     Head: Normocephalic and atraumatic.     Right Ear: External ear normal.  Eyes:     Pupils: Pupils are equal, round, and reactive to light.  Neck:     Thyroid: No thyromegaly.  Cardiovascular:     Rate and Rhythm: Normal rate and regular rhythm.     Heart sounds: Normal heart sounds. No murmur heard.   Pulmonary:     Effort: Pulmonary effort is normal. No respiratory distress.     Breath sounds: Normal breath sounds. No wheezing.  Abdominal:     General: Bowel sounds are normal. There is no distension.     Palpations: Abdomen is soft.     Tenderness: There is abdominal tenderness (generalized).  Musculoskeletal:        General: No tenderness.     Cervical back: Normal range of motion and neck supple.     Comments: Pain in lumbar with flexion and  extension  Skin:    General: Skin is warm and dry.  Neurological:     Mental Status: She is alert and oriented to person, place, and time.     Cranial Nerves: No cranial nerve deficit.     Deep Tendon Reflexes: Reflexes are normal and symmetric.  Psychiatric:        Mood and Affect: Mood is anxious.        Behavior: Behavior normal.        Thought Content: Thought content normal.        Judgment: Judgment normal.       BP 95/64   Pulse 73   Temp 97.7 F (36.5 C) (Temporal)   Ht 5\' 5"  (1.651 m)   Wt 231 lb 6.4 oz (105 kg)   BMI 38.51 kg/m      Assessment  & Plan:  Colleen Valentine comes in today with chief complaint of Medical Management of Chronic Issues (Discuss wt loss wants phentermine. Lamictal not working wants a change. ), Gastroesophageal Reflux (Wants 40 mg of prilosec), and Back Pain   Diagnosis and orders addressed:  1. Gastroesophageal reflux disease without esophagitis - omeprazole (PRILOSEC) 20 MG capsule; Take 1 capsule (20 mg total) by mouth daily.  Dispense: 90 capsule; Refill: 1  2. Epidural abscess  3. MDD (major depressive disorder), recurrent episode, moderate (HCC) Will increase Lamictal to 100 mg Stress management  Follow up with [place]  - lamoTRIgine (LAMICTAL) 100 MG tablet; Take 1 tablet (100 mg total) by mouth daily.  Dispense: 90 tablet; Refill: 1  4. Anxiety state - lamoTRIgine (LAMICTAL) 100 MG tablet; Take 1 tablet (100 mg total) by mouth daily.  Dispense: 90 tablet; Refill: 1  5. Current smoker  6. Genital herpes simplex, unspecified site - valACYclovir (VALTREX) 500 MG tablet; Take 1 tablet (500 mg total) by mouth daily.  Dispense: 90 tablet; Refill: 1  7. PTSD (post-traumatic stress disorder) - lamoTRIgine (LAMICTAL) 100 MG tablet; Take 1 tablet (100 mg total) by mouth daily.  Dispense: 90 tablet; Refill: 1  8. Acute low back pain, unspecified back pain laterality, unspecified whether sciatica present -Will give gabapentin 100 mg TID  - Ambulatory referral to Pain Clinic - gabapentin (NEURONTIN) 100 MG capsule; Take 1 capsule (100 mg total) by mouth 3 (three) times daily.  Dispense: 90 capsule; Refill: 3   Labs reviewed from last month Health Maintenance reviewed Diet and exercise encouraged  Follow up plan: 1 month to recheck GAD and Depression and back pain   Evelina Dun, FNP

## 2020-10-05 NOTE — Patient Instructions (Signed)
Major Depressive Disorder, Adult Major depressive disorder (MDD) is a mental health condition. It may also be called clinical depression or unipolar depression. MDD causes symptoms of sadness, hopelessness, and loss of interest in things. These symptoms last most of the day, almost every day, for 2 weeks. MDD can also cause physical symptoms. It can interfere with relationships and with everyday activities, such as work, school, and activities that are usually pleasant. MDD may be mild, moderate, or severe. It may be single-episode MDD, which happens once, or recurrent MDD, which may occur multiple times. What are the causes? The exact cause of this condition is not known. MDD is most likely caused by a combination of things, which may include:  Your personality traits.  Learned or conditioned behaviors or thoughts or feelings that reinforce negativity.  Any alcohol or substance misuse.  Long-term (chronic) physical or mental health illness.  Going through a traumatic experience or major life changes. What increases the risk? The following factors may make someone more likely to develop MDD:  A family history of depression.  Being a woman.  Troubled family relationships.  Abnormally low levels of certain brain chemicals.  Traumatic or painful events in childhood, especially abuse or loss of a parent.  A lot of stress from life experiences, such as poor living conditions or discrimination.  Chronic physical illness or other mental health disorders. What are the signs or symptoms? The main symptoms of MDD usually include:  Constant depressed or irritable mood.  A loss of interest in things and activities. Other symptoms include:  Sleeping or eating too much or too little.  Unexplained weight gain or weight loss.  Tiredness or low energy.  Being agitated, restless, or weak.  Feeling hopeless, worthless, or guilty.  Trouble thinking clearly or making  decisions.  Thoughts of suicide or thoughts of harming others.  Isolating oneself or avoiding other people or activities.  Trouble completing tasks, work, or any normal obligations. Severe symptoms of this condition may include:  Psychotic depression.This may include false beliefs, or delusions. It may also include seeing, hearing, tasting, smelling, or feeling things that are not real (hallucinations).  Chronic depression or persistent depressive disorder. This is low-level depression that lasts for at least 2 years.  Melancholic depression, or feeling extremely sad and hopeless.  Catatonic depression, which includes trouble speaking and trouble moving. How is this diagnosed? This condition may be diagnosed based on:  Your symptoms.  Your medical and mental health history. You may be asked questions about your lifestyle, including any drug and alcohol use.  A physical exam.  Blood tests to rule out other conditions. MDD is confirmed if you have the following symptoms most of the day, nearly every day, in a 2-week period:  Either a depressed mood or loss of interest.  At least four other MDD symptoms. How is this treated? This condition is usually treated by mental health professionals, such as psychologists, psychiatrists, and clinical social workers. You may need more than one type of treatment. Treatment may include:  Psychotherapy, also called talk therapy or counseling. Types of psychotherapy include: ? Cognitive behavioral therapy (CBT). This teaches you to recognize unhealthy feelings, thoughts, and behaviors, and replace them with positive thoughts and actions. ? Interpersonal therapy (IPT). This helps you to improve the way you communicate with others or relate to them. ? Family therapy. This treatment includes members of your family.  Medicines to treat anxiety and depression. These medicines help to balance the brain chemicals   that affect your emotions.  Lifestyle  changes. You may be asked to: ? Limit alcohol use and avoid drug use. ? Get regular exercise. ? Get plenty of sleep. ? Make healthy eating choices. ? Spend more time outdoors.  Brain stimulation. This may be done if symptoms are very severe and other treatments have not worked. Examples of this treatment are electroconvulsive therapy and transcranial magnetic stimulation. Follow these instructions at home: Activity  Exercise regularly and spend time outdoors.  Find activities that you enjoy doing, and make time to do them.  Find healthy ways to manage stress, such as: ? Meditation or deep breathing. ? Spending time in nature. ? Journaling.  Return to your normal activities as told by your health care provider. Ask your health care provider what activities are safe for you. Alcohol and drug use  If you drink alcohol: ? Limit how much you use to:  0-1 drink a day for women who are not pregnant.  0-2 drinks a day for men. ? Be aware of how much alcohol is in your drink. In the [place] one drink equals one 12 oz bottle of beer (355 mL), one 5 oz glass of wine (148 mL), or one 1 oz glass of hard liquor (44 mL). ? Discuss your alcohol use with your health care provider. Alcohol can affect any antidepressant medicines you are taking.  Discuss any drug use with your health care provider. General instructions  Take over-the-counter and prescription medicines only as told by your health care provider.  Eat a healthy diet and get plenty of sleep.  Consider joining a support group. Your health care provider may be able to recommend one.  Keep all follow-up visits as told by your health care provider. This is important.   Where to find more information  National Alliance on Mental Illness: www.nami.org  [place] National Institute of Mental Health: www.nimh.nih.gov Contact a health care provider if:  Your symptoms get worse.  You develop new symptoms. Get help right away if:  You  self-harm.  You have serious thoughts about hurting yourself or others.  You hallucinate. If you ever feel like you may hurt yourself or others, or have thoughts about taking your own life, get help right away. Go to your nearest emergency department or:  Call your local emergency services (911 in the [place]  Call a suicide crisis helpline, such as the National Suicide Prevention Lifeline at 1-800-273-8255. This is open 24 hours a day in the [place]  Text the Crisis Text Line at 741741 (in the [place] Summary  Major depressive disorder (MDD) is a mental health condition. MDD causes symptoms of sadness, hopelessness, and loss of interest in things. These symptoms last most of the day, almost every day, for 2 weeks.  The symptoms of MDD can interfere with relationships and with everyday activities.  Treatments and support are available for people who develop MDD. You may need more than one type of treatment.  Get help right away if you have serious thoughts about hurting yourself or others. This information is not intended to replace advice given to you by your health care provider. Make sure you discuss any questions you have with your health care provider. Document Revised: 04/24/2019 Document Reviewed: 04/24/2019 Elsevier Patient Education  2021 Elsevier Inc.  

## 2020-10-06 DIAGNOSIS — G062 Extradural and subdural abscess, unspecified: Secondary | ICD-10-CM | POA: Diagnosis not present

## 2020-10-07 DIAGNOSIS — G062 Extradural and subdural abscess, unspecified: Secondary | ICD-10-CM | POA: Diagnosis not present

## 2020-10-08 DIAGNOSIS — G062 Extradural and subdural abscess, unspecified: Secondary | ICD-10-CM | POA: Diagnosis not present

## 2020-10-09 DIAGNOSIS — G062 Extradural and subdural abscess, unspecified: Secondary | ICD-10-CM | POA: Diagnosis not present

## 2020-10-10 DIAGNOSIS — M4624 Osteomyelitis of vertebra, thoracic region: Secondary | ICD-10-CM | POA: Diagnosis not present

## 2020-10-10 DIAGNOSIS — G062 Extradural and subdural abscess, unspecified: Secondary | ICD-10-CM | POA: Diagnosis not present

## 2020-10-11 ENCOUNTER — Telehealth: Payer: Self-pay

## 2020-10-11 DIAGNOSIS — G062 Extradural and subdural abscess, unspecified: Secondary | ICD-10-CM | POA: Diagnosis not present

## 2020-10-11 NOTE — Telephone Encounter (Signed)
Patient notified of results and instructed to continue iv abx thru 10/18/2020. Patient reminded of follow up appointment as well.  Montavis Schubring Lorita Officer, RN

## 2020-10-11 NOTE — Telephone Encounter (Signed)
-----   Message from Mignon Pine, DO sent at 10/11/2020  3:32 PM EDT ----- Please let patient know that her MRI spine showed improvement and her epidural abscess has resolved.  She should continue with her daily daptomycin through 10/18/20 via PICC line and I will discuss further at follow up visit 10/18/20 as well.   Thanks, Mitzi Hansen

## 2020-10-12 DIAGNOSIS — G062 Extradural and subdural abscess, unspecified: Secondary | ICD-10-CM | POA: Diagnosis not present

## 2020-10-13 DIAGNOSIS — G062 Extradural and subdural abscess, unspecified: Secondary | ICD-10-CM | POA: Diagnosis not present

## 2020-10-14 ENCOUNTER — Emergency Department (HOSPITAL_COMMUNITY)
Admission: EM | Admit: 2020-10-14 | Discharge: 2020-10-14 | Disposition: A | Discharge status: Home / Self Care | Payer: Medicaid Other | Attending: Emergency Medicine | Admitting: Emergency Medicine

## 2020-10-14 ENCOUNTER — Other Ambulatory Visit: Payer: Self-pay

## 2020-10-14 ENCOUNTER — Encounter (HOSPITAL_COMMUNITY): Payer: Self-pay | Admitting: *Deleted

## 2020-10-14 DIAGNOSIS — G062 Extradural and subdural abscess, unspecified: Secondary | ICD-10-CM | POA: Diagnosis not present

## 2020-10-14 DIAGNOSIS — Z87891 Personal history of nicotine dependence: Secondary | ICD-10-CM | POA: Diagnosis not present

## 2020-10-14 DIAGNOSIS — Z452 Encounter for adjustment and management of vascular access device: Secondary | ICD-10-CM | POA: Insufficient documentation

## 2020-10-14 DIAGNOSIS — Z4800 Encounter for change or removal of nonsurgical wound dressing: Secondary | ICD-10-CM

## 2020-10-14 DIAGNOSIS — Z4801 Encounter for change or removal of surgical wound dressing: Secondary | ICD-10-CM | POA: Diagnosis not present

## 2020-10-14 NOTE — ED Provider Notes (Signed)
[facility] EMERGENCY DEPARTMENT Provider Note   CSN: [id number] Arrival date & time: 10/14/20  1616     History Chief Complaint  Patient presents with  . Vascular Access Problem    PICC line    Colleen Valentine is a 25 y.o. female.  HPI      Colleen Valentine is a 25 y.o. female who presents to the Emergency Department here for evaluation of her PICC line.  She states that she accidentally fell in a pool and got her PICC line dressing wet earlier today.  She is here requesting to have it evaluated.  She is currently receiving IV daptomycin for epidural abscess.   has 4 doses left.  She has some mild itching of her arm, but denies any pain or injury of her arm.  No surrounding redness or bleeding from the site.   Past Medical History:  Diagnosis Date  . Acute pyelonephritis 05/17/2018  . Anemia   . Anxiety   . Complication of anesthesia   . Depression   . GERD (gastroesophageal reflux disease)   . Herpes genitalia   . Insomnia   . Obesity   . Polysubstance abuse ([place]) 04/01/2011  . PONV (postoperative nausea and vomiting)   . Seizures ([place])    once in 2016 due to alcohol poisioning  . Suicide [facility])     Patient Active Problem List   Diagnosis Date Noted  . PTSD (post-traumatic stress disorder) 10/05/2020  . Irregular menses 09/05/2020  . Epidural abscess   . Vertebral osteomyelitis ([place])   . MRSA bacteremia   . 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[place]) 08/22/2020  . Spinal cord compression ([place]) 08/21/2020  . Splenomegaly 08/16/2020  . Marijuana abuse 07/29/2020  . Anemia in pregnancy 07/29/2020  . Gestational diabetes mellitus in third trimester 07/29/2020  . Gram-positive cocci bacteremia 07/20/2020  . Abscess of axilla, left 07/19/2020  . Marijuana user 03/28/2020  . Gonorrhea 02/04/2020  . Chlamydia infection 01/25/2020  . Trichomonas infection 11/28/2019  . Esophageal dysphagia 02/27/2017  . Current smoker 08/12/2016  . Genital herpes simplex 04/08/2016  . GERD  (gastroesophageal reflux disease) 10/17/2015  . Scoliosis 05/04/2013  . Spondylolysis 05/04/2013  . Anxiety state 10/31/2012  . ADHD (attention deficit hyperactivity disorder), combined type 07/21/2012  . MDD (major depressive disorder), recurrent episode, moderate ([place]) 04/01/2011  . Oppositional defiant disorder 04/01/2011    Past Surgical History:  Procedure Laterality Date  . CESAREAN SECTION N/A 08/30/2020   Procedure: CESAREAN SECTION;  Surgeon: Gwynne Edinger, MD;  Location: [facility] LD ORS;  Service: Obstetrics;  Laterality: N/A;  . CHOLECYSTECTOMY  2015  . COSMETIC SURGERY  Age 29   dog bite to face  . IR FLUORO GUIDED NEEDLE PLC ASPIRATION/INJECTION LOC  08/22/2020  . IR US GUIDE BX ASP/DRAIN  08/22/2020  . LAMINECTOMY N/A 08/22/2020   Procedure: THORACIC TWELVE TO LUMBAR ONELAMINECTOMY FOR DRAINAGE OF EPIDURAL ABSCESS;  Surgeon: Kristeen Miss, MD;  Location: [place] [facility];  Service: Neurosurgery;  Laterality: N/A;     OB History    Gravida  5   Para  2   Term  1   Preterm  1   AB  3   Living  2     SAB  2   IAB  1   Ectopic      Multiple  0   Live Births  2           Family History  Problem Relation Age of Onset  . Alcohol  abuse Mother   . Stroke Mother   . Bipolar disorder Mother   . Drug abuse Mother   . Alcohol abuse Father   . Bipolar disorder Father   . Drug abuse Father   . Colon cancer Maternal Grandfather   . Crohn's disease Paternal Grandmother   . Crohn's disease Paternal Aunt   . Crohn's disease Paternal Aunt   . Crohn's disease Paternal Uncle   . Colon cancer Paternal Uncle   . Cancer Maternal Aunt        "stomach cancer"  . Celiac disease Neg Hx     Social History   Tobacco Use  . Smoking status: Former Smoker    Packs/day: 0.50    Years: 5.00    Pack years: 2.50    Types: Cigarettes    Quit date: 11/24/2019    Years since quitting: 0.8  . Smokeless tobacco: Never Used  . Tobacco comment: one pack cigarettes daily  Vaping Use   . Vaping Use: Some days  Substance Use Topics  . Alcohol use: Not Currently    Alcohol/week: 0.0 standard drinks    Comment: social  . Drug use: Yes    Types: Marijuana    Comment: SMOKED TODAY    Home Medications Prior to Admission medications   Medication Sig Start Date End Date Taking? Authorizing Provider  acetaminophen (TYLENOL) 500 MG tablet Take 1,000 mg by mouth every 6 (six) hours as needed for mild pain or headache.    [provider]  daptomycin (CUBICIN) IVPB Inject 900 mg into the vein daily. Indication:  Epidural abscess First Dose: No Last Day of Therapy:  10/18/2020 Labs - Once weekly:  CBC/D, BMP, and CPK Labs - Every other week:  ESR and CRP Method of administration: IV Push Method of administration may be changed at the discretion of home infusion pharmacist based upon assessment of the patient and/or caregiver's ability to self-administer the medication ordered. 08/31/20 10/24/20  Janyth Pupa, DO  gabapentin (NEURONTIN) 100 MG capsule Take 1 capsule (100 mg total) by mouth 3 (three) times daily. 10/05/20   Sharion Balloon, FNP  lamoTRIgine (LAMICTAL) 100 MG tablet Take 1 tablet (100 mg total) by mouth daily. 10/05/20   Sharion Balloon, FNP  omeprazole (PRILOSEC) 20 MG capsule Take 1 capsule (20 mg total) by mouth daily. 10/05/20   Sharion Balloon, FNP  valACYclovir (VALTREX) 500 MG tablet Take 1 tablet (500 mg total) by mouth daily. 10/05/20   Sharion Balloon, FNP  albuterol (VENTOLIN HFA) 108 (90 Base) MCG/ACT inhaler Inhale 1-2 puffs into the lungs every 6 (six) hours as needed for wheezing or shortness of breath. Patient not taking: Reported on 11/18/2019 06/14/19 01/01/20  Wurst, [place], PA-C  pantoprazole (PROTONIX) 40 MG tablet Take 1 tablet (40 mg total) by mouth daily before breakfast. 06/25/18 01/14/19  Baruch Gouty, FNP    Allergies    Patient has no known allergies.  Review of Systems   Review of Systems  Constitutional: Negative for chills,  fatigue and fever.  Respiratory: Negative for shortness of breath.   Cardiovascular: Negative for chest pain.       PICC line right upper arm.    Gastrointestinal: Negative for abdominal pain, nausea and vomiting.  Genitourinary: Negative for dysuria, flank pain and hematuria.  Musculoskeletal: Negative for arthralgias, back pain, myalgias, neck pain and neck stiffness.  Skin: Negative for color change, rash and wound.  Neurological: Negative for dizziness, weakness and numbness.  Hematological: Does not bruise/bleed easily.    Physical Exam Updated Vital Signs BP 124/77 (BP Location: Right Arm)   Pulse 63   Temp 98 F (36.7 C) (Oral)   Resp 18   Ht '5\' 5"'  (1.651 m)   Wt 104.3 kg   SpO2 100%   BMI 38.27 kg/m   Physical Exam Constitutional:      General: She is not in acute distress.    Appearance: Normal appearance. She is not ill-appearing or toxic-appearing.  HENT:     Head: Normocephalic.     Mouth/Throat:     Mouth: Mucous membranes are moist.     Pharynx: Oropharynx is clear.  Neck:     Thyroid: No thyromegaly.     Meningeal: Kernig's sign absent.  Cardiovascular:     Rate and Rhythm: Normal rate and regular rhythm.     Pulses: Normal pulses.     Comments: Intact PICC line right upper arm.  No injury or bleeding at the site.  Dressing wet and loosely attached. Pulmonary:     Effort: Pulmonary effort is normal.     Breath sounds: Normal breath sounds. No wheezing.  Abdominal:     Palpations: Abdomen is soft.     Tenderness: There is no abdominal tenderness. There is no guarding or rebound.  Musculoskeletal:        General: Normal range of motion.     Cervical back: Normal range of motion and neck supple.  Skin:    General: Skin is warm.     Capillary Refill: Capillary refill takes less than 2 seconds.     Findings: No rash.  Neurological:     General: No focal deficit present.     Mental Status: She is alert.     Sensory: No sensory deficit.     Motor: No  weakness.     ED Results / Procedures / Treatments   Labs (all labs ordered are listed, but only abnormal results are displayed) Labs Reviewed - No data to display  EKG None  Radiology No results found.  Procedures Procedures   Medications Ordered in ED Medications - No data to display  ED Course  I have reviewed the triage vital signs and the nursing notes.  Pertinent labs & imaging results that were available during my care of the patient were reviewed by me and considered in my medical decision making (see chart for details).    MDM Rules/Calculators/A&P                          Patient here for evaluation of her PICC line.  She accidentally fell in a pool today and her bandages are loose and wet.  PICC line appears appropriately positioned.  No bleeding or redness at the site. PICC line flushed without difficulty.  Good return.  Area cleaned well and bandage replaced by nursing staff.   Pt agrees to close f/u with her ID provider.    Final Clinical Impression(s) / ED Diagnoses Final diagnoses:  Encounter for change of dressing    Rx / DC Orders ED Discharge Orders    None       Bufford Lope 10/14/20 1830    Milton Ferguson, MD 10/14/20 2310

## 2020-10-14 NOTE — ED Triage Notes (Signed)
Pt states she fell in pool today and the dressing around her picc line is coming off; pt denies any pain just c/o itching; pt has the picc for an epidural abscess

## 2020-10-14 NOTE — ED Notes (Signed)
Positive Blood return and nonresistant flush with PICC line in right brachial at this time. Dressing changed sterile and provided with new stat lock

## 2020-10-14 NOTE — Discharge Instructions (Addendum)
Follow-up with your PICC line nurse or infectious disease provider for recheck.  Return to the ER if needed.

## 2020-10-15 DIAGNOSIS — G062 Extradural and subdural abscess, unspecified: Secondary | ICD-10-CM | POA: Diagnosis not present

## 2020-10-16 DIAGNOSIS — M4624 Osteomyelitis of vertebra, thoracic region: Secondary | ICD-10-CM | POA: Diagnosis not present

## 2020-10-16 DIAGNOSIS — G062 Extradural and subdural abscess, unspecified: Secondary | ICD-10-CM | POA: Diagnosis not present

## 2020-10-17 DIAGNOSIS — G062 Extradural and subdural abscess, unspecified: Secondary | ICD-10-CM | POA: Diagnosis not present

## 2020-10-18 ENCOUNTER — Telehealth: Payer: Self-pay

## 2020-10-18 ENCOUNTER — Ambulatory Visit (INDEPENDENT_AMBULATORY_CARE_PROVIDER_SITE_OTHER): Payer: Medicaid Other | Admitting: Internal Medicine

## 2020-10-18 ENCOUNTER — Other Ambulatory Visit: Payer: Self-pay

## 2020-10-18 ENCOUNTER — Encounter: Payer: Self-pay | Admitting: Internal Medicine

## 2020-10-18 VITALS — BP 111/72 | HR 107 | Temp 98.7°F | Wt 240.0 lb

## 2020-10-18 DIAGNOSIS — M4620 Osteomyelitis of vertebra, site unspecified: Secondary | ICD-10-CM | POA: Diagnosis not present

## 2020-10-18 DIAGNOSIS — R7881 Bacteremia: Secondary | ICD-10-CM | POA: Diagnosis not present

## 2020-10-18 DIAGNOSIS — G062 Extradural and subdural abscess, unspecified: Secondary | ICD-10-CM

## 2020-10-18 DIAGNOSIS — B9562 Methicillin resistant Staphylococcus aureus infection as the cause of diseases classified elsewhere: Secondary | ICD-10-CM | POA: Diagnosis not present

## 2020-10-18 NOTE — Telephone Encounter (Signed)
Called and spoke [facility] , per Dr.Wallace pt  Pull PICC Line after last dose  antibiotic today 10/18/2020.   Spoke with Pam and confirmed and gave verbal okay to [place].

## 2020-10-18 NOTE — Patient Instructions (Signed)
Thank you for coming to see me today. It was a pleasure seeing you.  To Do: Marland Kitchen Complete daptomycin today and then will have PICC line removed . ECHO on 11/06/20 . Follow up as needed  If you have any questions or concerns, please do not hesitate to call the office at [phone].  Take Care,   Jule Ser, DO

## 2020-10-18 NOTE — Progress Notes (Signed)
[facility] for Infectious [facility]  Reason for visit: follow-up for vertebral osteomyelitis, epidural abscess, MRSA bacteremia   HPI:    Colleen Valentine is a 25 y.o. 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   She was hospitalized 3/28 through 09/03/2020 after presenting at 35 weeks, 3 days for back pain and weakness.  She was found to have epidural abscess spanning from T10-L3.  She underwent laminotomies with decompression of the epidural abscess with neurosurgery on 08/23/2020.  OR cultures grew MRSA.  She also underwent cesarean section on 08/30/2020.  She was seen by my partner, Dr. Gale Journey, during her hospitalization who recommended daptomycin 8 mg/kg for 8 weeks from the date of her epidural abscess surgery (end date of 10/18/2020) with repeat MRI midway through her course of antibiotics.   She had follow up MRI completed on 10/05/20 which showed resolution of her epidural collections.  She followed up with NSGY who was pleased with her progress.  Her last dose of daptomycin is scheduled for today. She continues to tolerate daptomycin without any side effects.  Recent OPAT labs from 5/17 with ESR 9.  Patient's Medications  New Prescriptions   No medications on file  Previous Medications   ACETAMINOPHEN (TYLENOL) 500 MG TABLET    Take 1,000 mg by mouth every 6 (six) hours as needed for mild pain or headache.   DAPTOMYCIN (CUBICIN) IVPB    Inject 900 mg into the vein daily. Indication:  Epidural abscess First Dose: No Last Day of Therapy:  10/18/2020 Labs - Once weekly:  CBC/D, BMP, and CPK Labs - Every other week:  ESR and CRP Method of administration: IV Push Method of administration may be changed at the discretion of home infusion pharmacist based upon assessment of the patient and/or caregiver's ability to self-administer the medication ordered.   GABAPENTIN (NEURONTIN) 100 MG CAPSULE    Take 1 capsule  (100 mg total) by mouth 3 (three) times daily.   LAMOTRIGINE (LAMICTAL) 100 MG TABLET    Take 1 tablet (100 mg total) by mouth daily.   OMEPRAZOLE (PRILOSEC) 20 MG CAPSULE    Take 1 capsule (20 mg total) by mouth daily.   VALACYCLOVIR (VALTREX) 500 MG TABLET    Take 1 tablet (500 mg total) by mouth daily.  Modified Medications   No medications on file  Discontinued Medications   No medications on file      Past Medical History:  Diagnosis Date  . Acute pyelonephritis 05/17/2018  . Anemia   . Anxiety   . Complication of anesthesia   . Depression   . GERD (gastroesophageal reflux disease)   . Herpes genitalia   . Insomnia   . Obesity   . Polysubstance abuse ([place]) 04/01/2011  . PONV (postoperative nausea and vomiting)   . Seizures ([place])    once in 2016 due to alcohol poisioning  . Suicide [facility])     Social History   Tobacco Use  . Smoking status: Former Smoker    Packs/day: 0.50    Years: 5.00    Pack years: 2.50    Types: Cigarettes    Quit date: 11/24/2019    Years since quitting: 0.9  . Smokeless tobacco: Never Used  . Tobacco comment: one pack cigarettes daily  Vaping Use  . Vaping Use: Some days  Substance Use Topics  . Alcohol use: Not Currently    Alcohol/week: 0.0 standard drinks  Comment: social  . Drug use: Yes    Types: Marijuana    Comment: SMOKED TODAY    Family History  Problem Relation Age of Onset  . Alcohol abuse Mother   . Stroke Mother   . Bipolar disorder Mother   . Drug abuse Mother   . Alcohol abuse Father   . Bipolar disorder Father   . Drug abuse Father   . Colon cancer Maternal Grandfather   . Crohn's disease Paternal Grandmother   . Crohn's disease Paternal Aunt   . Crohn's disease Paternal Aunt   . Crohn's disease Paternal Uncle   . Colon cancer Paternal Uncle   . Cancer Maternal Aunt        "stomach cancer"  . Celiac disease Neg Hx     No Known Allergies  Review of Systems  Constitutional: Negative.   Gastrointestinal:  Negative.   Musculoskeletal: Positive for back pain.      OBJECTIVE:    Vitals:   10/18/20 1533 10/18/20 1535  BP:  111/72  Pulse:  (!) 107  Temp:  98.7 F (37.1 C)  SpO2:  100%  Weight: 240 lb (108.9 kg)      Body mass index is 39.94 kg/m.  Physical Exam Constitutional:      General: She is not in acute distress.    Appearance: Normal appearance.  HENT:     Head: Normocephalic and atraumatic.  Neurological:     General: No focal deficit present.     Mental Status: She is alert and oriented to person, place, and time.  Psychiatric:        Mood and Affect: Mood normal.        Behavior: Behavior normal.      Labs and Microbiology:  CBC Latest Ref Rng & Units 09/05/2020 08/31/2020 08/28/2020  WBC 4.0 - 10.5 K/uL 5.9 9.2 6.1  Hemoglobin 12.0 - 15.0 g/dL 7.3(L) 7.2(L) 8.7(L)  Hematocrit 36.0 - 46.0 % 24.4(L) 22.2(L) 26.7(L)  Platelets 150 - 400 K/uL 299 256 270   CMP Latest Ref Rng & Units 09/05/2020 08/26/2020 08/24/2020  Glucose 70 - 99 mg/dL 87 - -  BUN 6 - 20 mg/dL 15 - -  Creatinine 0.44 - 1.00 mg/dL 0.64 0.52 0.55  Sodium 135 - 145 mmol/L 142 - -  Potassium 3.5 - 5.1 mmol/L 3.4(L) - -  Chloride 98 - 111 mmol/L 105 - -  CO2 22 - 32 mmol/L 24 - -  Calcium 8.9 - 10.3 mg/dL 8.8(L) - -  Total Protein 6.5 - 8.1 g/dL 6.5 - -  Total Bilirubin 0.3 - 1.2 mg/dL 0.5 - -  Alkaline Phos 38 - 126 U/L 81 - -  AST 15 - 41 U/L 19 - -  ALT 0 - 44 U/L 19 - -     No results found for this or any previous visit (from the past 240 hour(s)).  Imaging: IMPRESSION: 1. Postoperative changes from interval decompressive laminotomies at T11-12 and T12-L1 on the left. Previously seen epidural collection extending from T10 through L3 has resolved. Mild residual marrow edema and enhancement about the left T11-12 facet as well as the left T10-11 through L1-2 neural foramina, improved from previous. Enhancement within the adjacent left paraspinous soft tissues is also markedly improved,  with no residual soft tissue collections identified. 2. Irregularity with mild flattening of the lower thoracic cord at the level of T11-12, likely post infectious/inflammatory in nature. Question associated faint cord signal abnormality at this level as above.  3. No other evidence for new or distant infection within the thoracolumbar spine. 4. Mild degenerative disc disease at L3-4 through L5-S1 without significant stenosis or neural impingement.   ASSESSMENT & PLAN:    1. Epidural abscess  2. MRSA bacteremia  3. Vertebral osteomyelitis ([place])  -- after today's dose will stop Daptomycin 956m IV q24h as she has completed 8 weeks with radiographic resolution of epidural abscess on follow up MRI -- Echo scheduled for 6/13 and will follow up  -- RTC as needed   ARaynelle Highlandfor Infectious Disease [facility] 10/18/2020, 3:49 PM

## 2020-10-28 ENCOUNTER — Telehealth: Payer: Medicaid Other | Admitting: Nurse Practitioner

## 2020-10-28 DIAGNOSIS — J00 Acute nasopharyngitis [common cold]: Secondary | ICD-10-CM

## 2020-10-28 MED ORDER — FLUTICASONE PROPIONATE 50 MCG/ACT NA SUSP: 2.0000 | Freq: Every day | NASAL | 6 refills | Status: DC

## 2020-10-28 NOTE — Progress Notes (Signed)
We are sorry you are not feeling well.  Here is how we plan to help!  Based on what you have shared with me, it looks like you may have a viral upper respiratory infection.  Upper respiratory infections are caused by a large number of viruses; however, rhinovirus is the most common cause.   Symptoms vary from person to person, with common symptoms including sore throat, cough, fatigue or lack of energy and feeling of general discomfort.  A low-grade fever of up to 100.4 may present, but is often uncommon.  Symptoms vary however, and are closely related to a person's age or underlying illnesses.  The most common symptoms associated with an upper respiratory infection are nasal discharge or congestion, cough, sneezing, headache and pressure in the ears and face.  These symptoms usually persist for about 3 to 10 days, but can last up to 2 weeks.  It is important to know that upper respiratory infections do not cause serious illness or complications in most cases.    Upper respiratory infections can be transmitted from person to person, with the most common method of transmission being a person's hands.  The virus is able to live on the skin and can infect other persons for up to 2 hours after direct contact.  Also, these can be transmitted when someone coughs or sneezes; thus, it is important to cover the mouth to reduce this risk.  To keep the spread of the illness at [place], good hand hygiene is very important.  This is an infection that is most likely caused by a virus. There are no specific treatments other than to help you with the symptoms until the infection runs its course.  We are sorry you are not feeling well.  Here is how we plan to help!   For nasal congestion, you may use an oral decongestants such as Mucinex D or if you have glaucoma or high blood pressure use plain Mucinex.  Saline nasal spray or nasal drops can help and can safely be used as often as needed for congestion.  For your congestion,  I have prescribed Fluticasone nasal spray one spray in each nostril twice a day  If you do not have a history of heart disease, hypertension, diabetes or thyroid disease, prostate/bladder issues or glaucoma, you may also use Sudafed to treat nasal congestion.  It is highly recommended that you consult with a pharmacist or your primary care physician to ensure this medication is safe for you to take.     If you have a cough, you may use cough suppressants such as Delsym and Robitussin.  If you have glaucoma or high blood pressure, you can also use Coricidin HBP.    If you have a sore or scratchy throat, use a saltwater gargle-  to  teaspoon of salt dissolved in a 4-ounce to 8-ounce glass of warm water.  Gargle the solution for approximately 15-30 seconds and then spit.  It is important not to swallow the solution.  You can also use throat lozenges/cough drops and Chloraseptic spray to help with throat pain or discomfort.  Warm or cold liquids can also be helpful in relieving throat pain.  For headache, pain or general discomfort, you can use Ibuprofen or Tylenol as directed.   Some authorities believe that zinc sprays or the use of Echinacea may shorten the course of your symptoms.   HOME CARE . Only take medications as instructed by your medical team. . Be sure to drink plenty  of fluids. Water is fine as well as fruit juices, sodas and electrolyte beverages. You may want to stay away from caffeine or alcohol. If you are nauseated, try taking small sips of liquids. How do you know if you are getting enough fluid? Your urine should be a pale yellow or almost colorless. . Get rest. . Taking a steamy shower or using a humidifier may help nasal congestion and ease sore throat pain. You can place a towel over your head and breathe in the steam from hot water coming from a faucet. . Using a saline nasal spray works much the same way. . Cough drops, hard candies and sore throat lozenges may ease your  cough. . Avoid close contacts especially the very young and the elderly . Cover your mouth if you cough or sneeze . Always remember to wash your hands.   GET HELP RIGHT AWAY IF: . You develop worsening fever. . If your symptoms do not improve within 10 days . You develop yellow or green discharge from your nose over 3 days. . You have coughing fits . You develop a severe head ache or visual changes. . You develop shortness of breath, difficulty breathing or start having chest pain . Your symptoms persist after you have completed your treatment plan  MAKE SURE YOU   Understand these instructions.  Will watch your condition.  Will get help right away if you are not doing well or get worse.  Your e-visit answers were reviewed by a board certified advanced clinical practitioner to complete your personal care plan. Depending upon the condition, your plan could have included both over the counter or prescription medications. Please review your pharmacy choice. If there is a problem, you may call our nursing hot line at and have the prescription routed to another pharmacy. Your safety is important to [place]. If you have drug allergies check your prescription carefully.   You can use MyChart to ask questions about today's visit, request a non-urgent call back, or ask for a work or school excuse for 24 hours related to this e-Visit. If it has been greater than 24 hours you will need to follow up with your provider, or enter a new e-Visit to address those concerns. You will get an e-mail in the next two days asking about your experience.  I hope that your e-visit has been valuable and will speed your recovery. Thank you for using e-visits.   5-10 minutes spent reviewing and documenting in chart.

## 2020-10-29 MED ORDER — ALBUTEROL SULFATE HFA 108 (90 BASE) MCG/ACT IN AERS: 2.0000 | INHALATION_SPRAY | Freq: Four times a day (QID) | RESPIRATORY_TRACT | 0 refills | Status: DC | PRN

## 2020-10-29 NOTE — Addendum Note (Signed)
Addended by: Evelina Dun A on: 10/29/2020 07:02 AM   Modules accepted: Orders

## 2020-10-31 ENCOUNTER — Other Ambulatory Visit: Payer: Medicaid Other | Admitting: Obstetrics & Gynecology

## 2020-10-31 ENCOUNTER — Other Ambulatory Visit: Payer: Medicaid Other

## 2020-10-31 DIAGNOSIS — Z8632 Personal history of gestational diabetes: Secondary | ICD-10-CM

## 2020-10-31 DIAGNOSIS — Z131 Encounter for screening for diabetes mellitus: Secondary | ICD-10-CM

## 2020-11-06 ENCOUNTER — Ambulatory Visit (HOSPITAL_COMMUNITY): Admission: RE | Admit: 2020-11-06 | Payer: Medicaid Other | Source: Ambulatory Visit

## 2020-11-08 ENCOUNTER — Telehealth: Payer: Medicaid Other | Admitting: Family

## 2020-11-08 DIAGNOSIS — G8929 Other chronic pain: Secondary | ICD-10-CM

## 2020-11-08 NOTE — Progress Notes (Signed)
Based on what you shared with me, I feel your condition warrants further evaluation and I recommend that you be seen in a face to face office visit.  Given your pain you need to be seen face to face. I recommend calling the office and asking for a new referral to pain clinic. The pain clinic it was sent to was denied.    NOTE: If you entered your credit card information for this eVisit, you will not be charged. You may see a "hold" on your card for the $35 but that hold will drop off and you will not have a charge processed.   If you are having a true medical emergency please call 911.      For an urgent face to face visit, [facility] has six urgent care centers for your convenience:     [facility] [place] [facility] [place] Get Driving Directions [phone] [address] [place], [place] [address] 8 am - 4 pm Monday - Friday    [facility]) Get Driving Directions [phone] [place], [place] [address] 8 am to 8 pm Monday-Friday 10 am to 6 pm [facility] [place] ([place]) Get Driving Directions [phone]  [address] [place],  [place]  [address] 8 am to 8 pm Monday-Friday 8 am to 4 pm [facility] at [facility] Get Driving Directions [phone] [address] [place], [place], [place] [address] 8 am to 8 pm Monday-Friday 8 am to 4 pm [facility] at [facility] Get Driving Directions  [phone] [address] [place], [place] [address] 8 am to 8 pm Monday-Friday 8 am to 4 pm [facility] at [place] Get Driving Directions [phone] [address] [place], [place] [address] 8 am to 8 pm Monday-Friday 8 am to 4 pm Saturday-Sunday     Your MyChart E-visit questionnaire answers were reviewed by a board  certified advanced clinical practitioner to complete your personal care plan based on your specific symptoms.  Thank you for using e-Visits.

## 2020-11-13 NOTE — BH Specialist Note (Signed)
Integrated Behavioral Health via Telemedicine Visit  11/13/2020 Colleen Valentine [id number]  Number of [place] visits: 1 Session Start time: 4:40  Session End time: 5:23 Total time:  13  Referring Provider: Wells Guiles, CNM Patient/Family location: Home [facility]  All persons participating in visit: Patient Colleen Valentine and [place]   Types of Service: Individual psychotherapy and Video visit  I connected with [name] and/or Thayer Headings Jimerson's  n/a  via  Telephone or Video Enabled Telemedicine Application  (Video is Caregility application) and verified that I am speaking with the correct person using two identifiers. Discussed confidentiality: Yes   I discussed the limitations of telemedicine and the availability of in person appointments.  Discussed there is a possibility of technology failure and discussed alternative modes of communication if that failure occurs.  I discussed that engaging in this telemedicine visit, they consent to the provision of behavioral healthcare and the services will be billed under their insurance.  Patient and/or legal guardian expressed understanding and consented to Telemedicine visit: Yes   Presenting Concerns: Patient and/or family reports the following symptoms/concerns: Pt states her primary concern today is panic attacks at least three times/day, anxiety increasing anger and vomiting, feeling as if "under water every day", with increase in early March 2022. Pt stopped taking Lamictal as was not managing panic; coping strategies used to help, no longer reducing anger or panic.  Duration of problem: Ongoing; increase postpartum; Severity of problem: severe  Patient and/or Family's Strengths/Protective Factors: Sense of purpose  Goals Addressed: Patient will:  Reduce symptoms of: anxiety, depression, mood instability, and stress    Increase knowledge and/or ability of: healthy habits   Demonstrate ability to: Increase healthy adjustment to current life circumstances, Increase adequate support systems for patient/family, Increase motivation to adhere to plan of care, and Decrease self-medicating behaviors  Progress towards Goals: Ongoing  Interventions: Interventions utilized:  Solution-Focused Strategies, Medication Monitoring, Psychoeducation and/or Health Education, and Link to [employer] Standardized Assessments completed: Not Needed  Patient and/or Family Response: Pt agrees with treatment plan  Assessment: Patient currently experiencing PTSD, MDD, ADHD, all as previously diagnosed and Anxiety disorder, unspecified  Patient may benefit from psychoeducation and brief therapeutic interventions regarding coping with symptoms of anxiety with panic, depression, anger .  Plan: Follow up with behavioral health clinician on : Two weeks; Call [name] as needed at [phone]. [name] will call back next week if any changes to medication.  Behavioral recommendations:  -Continue to take vitamins, as recommended by medical provider -Continue using your coping skills throughout the day as needed, to help manage emotions -Read educational materials regarding coping with symptoms of anxiety with panic -Consider new mom online support group at www.postpartum.net  -Consider [facility], as needed (on After Visit Summary) Referral(s): [facility] [facility] (In Clinic) and [employer]:  new mom support  I discussed the assessment and treatment plan with the patient and/or parent/guardian. They were provided an opportunity to ask questions and all were answered. They agreed with the plan and demonstrated an understanding of the instructions.   They were advised to call back or seek an in-person evaluation if the symptoms worsen or if the condition fails to improve as anticipated.  [name], LCSW  Depression screen [facility] 2/9 10/05/2020 07/09/2019 05/07/2019 06/25/2018 05/25/2018  Decreased Interest 2 3 0 0 3  Down, Depressed, Hopeless 2 3 0 0 3  PHQ - 2 Score 4 6 0 0 6  Altered sleeping 2 2 - - 3  Tired, decreased energy 2 3 - - 3  Change in appetite 2 3 - - 2  Feeling bad or failure about yourself  2 3 - - 3  Trouble concentrating 2 3 - - 3  Moving slowly or fidgety/restless 1 3 - - 0  Suicidal thoughts 1 0 - - 0  PHQ-9 Score 16 23 - - 20  Difficult doing work/chores Extremely dIfficult - - - -  Some recent data might be hidden   GAD 7 : Generalized Anxiety Score 10/05/2020 07/09/2019  Nervous, Anxious, on Edge 3 3  Control/stop worrying 3 3  Worry too much - different things 3 3  Trouble relaxing 3 3  Restless 3 3  Easily annoyed or irritable 3 3  Afraid - awful might happen 3 3  Total GAD 7 Score 21 21  Anxiety Difficulty Extremely difficult -

## 2020-11-23 ENCOUNTER — Ambulatory Visit (INDEPENDENT_AMBULATORY_CARE_PROVIDER_SITE_OTHER): Payer: Medicaid Other | Admitting: Clinical

## 2020-11-23 DIAGNOSIS — F331 Major depressive disorder, recurrent, moderate: Secondary | ICD-10-CM

## 2020-11-23 DIAGNOSIS — F4310 Post-traumatic stress disorder, unspecified: Secondary | ICD-10-CM

## 2020-11-23 DIAGNOSIS — F902 Attention-deficit hyperactivity disorder, combined type: Secondary | ICD-10-CM

## 2020-11-23 DIAGNOSIS — F419 Anxiety disorder, unspecified: Secondary | ICD-10-CM

## 2020-11-23 NOTE — Patient Instructions (Addendum)
[facility] at [facility] [place], [place] [address] [phone] (main office) [phone] ([place] office)  [email or web address].net (new mom online support groups)  24/7 [facility] (24/7): [phone]   [address] [place], [place] [address] - 24 Hour Crisis Line: [phone]  Coping with Panic Attacks   What is a panic attack?  You may have had a panic attack if you experienced four or more of the symptoms listed below coming on abruptly and peaking in about 10 minutes.  Panic Symptoms    Pounding heart   Sweating   Trembling or shaking   Shortness of breath   Feeling of choking   Chest pain   Nausea or abdominal distress     Feeling dizzy, unsteady, lightheaded, or faint   Feelings of unreality or being detached from yourself   Fear of losing control or going crazy   Fear of dying   Numbness or tingling   Chills or hot flashes      Panic attacks are sometimes accompanied by avoidance of certain places or situations. These are often situations that would be difficult to escape from or in which help might not be available. Examples might include crowded shopping malls, public transportation, restaurants, or driving.   Why do panic attacks occur?   Panic attacks are the body's alarm system gone awry. All of [place] have a built-in alarm system, powered by adrenaline, which increases our heart rate, breathing, and blood flow in response to danger. Ordinarily, this 'danger response system' works well. In some people, however, the response is either out of proportion to whatever stress is going on, or may come out of the blue without any stress at all.   For example, if you are walking in the woods and see a bear coming your way, a variety of changes occur in your body to prepare you to either fight the danger or flee from the situation. Your  heart rate will increase to get more blood flow around your body, your breathing rate will quicken so that more oxygen is available, and your muscles will tighten in order to be ready to fight or run. You may feel nauseated as blood flow leaves your stomach area and moves into your limbs. These bodily changes are all essential to helping you survive the dangerous situation. After the danger has passed, your body functions will begin to go back to normal. This is because your body also has a system for "recovering" by bringing your body back down to a normal state when the danger is over.   As you can see, the emergency response system is adaptive when there is, in fact, a "true" or "real" danger (e.g., bear). However, sometimes people find that their emergency response system is triggered in "everyday" situations where there really is no true physical danger (e.g., in a meeting, in the grocery store, while driving in normal traffic, etc.).   What triggers a panic attack?  Sometimes particularly stressful situations can trigger a panic attack. For example, an argument with your spouse or stressors at work can cause a stress response (activating the emergency response system) because you perceive it as threatening or overwhelming, even if there is no direct risk to your survival.  Sometimes panic attacks don't seem to be triggered by anything in particular- they may "come out of the blue". Somehow, the natural "fight or flight" emergency  response system has gotten activated when there is no real danger. Why does the body go into "emergency mode" when there is no real danger?   Often, people with panic attacks are frightened or alarmed by the physical sensations of the emergency response system. First, unexpected physical sensations are experienced (tightness in your chest or some shortness of breath). This then leads to feeling fearful or alarmed by these symptoms ("Something's wrong!", "Am I having a heart  attack?", "Am I going to faint?") The mind perceives that there is a danger even though no real danger exists. This, in turn, activates the emergency response system ("fight or flight"), leading to a "full blown" panic attack. In summary, panic attacks occur when we misinterpret physical symptoms as signs of impending death, craziness, loss of control, embarrassment, or fear of fear. Sometimes you may be aware of thoughts of danger that activate the emergency response system (for example, thinking "I'm having a heart attack" when you feel chest pressure or increased heart rate). At other times, however, you may not be aware of such thoughts. After several incidences of being afraid of physical sensations, anxiety and panic can occur in response to the initial sensations without conscious thoughts of danger. Instead, you just feel afraid or alarmed. In other words, the panic or fear may seem to occur "automatically" without you consciously telling yourself anything.   After having had one or more panic attacks, you may also become more focused on what is going on inside your body. You may scan your body and be more vigilant about noticing any symptoms that might signal the start of a panic attack. This makes it easier for panic attacks to happen again because you pick up on sensations you might otherwise not have noticed, and misinterpret them as something dangerous. A panic attack may then result.      How do I cope with panic attacks?  An important part of overcoming panic attacks involves re-interpreting your body's physical reactions and teaching yourself ways to decrease the physical arousal. This can be done through practicing the cognitive and behavioral interventions below.   Research has found that over half of people who have panic attacks show some signs of hyperventilation or overbreathing. This can produce initial sensations that alarm you and lead to a panic attack. Overbreathing can also  develop as part of the panic attack and make the symptoms worse. When people hyperventilate, certain blood vessels in the body become narrower. In particular, the brain may get slightly less oxygen. This can lead to the symptoms of dizziness, confusion, and lightheadedness that often occur during panic attacks. Other parts of the body may also get a bit less oxygen, which may lead to numbness or tingling in the hands or feet or the sensation of cold, clammy hands. It also may lead the heart to pump harder. Although these symptoms may be frightening and feel unpleasant, it is important to remember that hyperventilating is not dangerous. However, you can help overcome the unpleasantness of overbreathing by practicing Breathing Retraining.   Practice this basic technique three times a day, every day:   Inhale. With your shoulders relaxed, inhale as slowly and deeply as you can while you count to six. If you can, use your diaphragm to fill your lungs with air.   Hold. Keep the air in your lungs as you slowly count to four.   Exhale. Slowly breath out as you count to six.   Repeat. Do the inhale-hold-exhale cycle several  times. Each time you do it, exhale for longer counts.  Like any new skill, Breathing Retraining requires practice. Try practicing this skill twice a day for several minutes. Initially, do not try this technique in specific situations or when you become frightened or have a panic attack. Begin by practicing in a quiet environment to build up your skill level so that you can later use it in time of "emergency."   2. Decreasing Avoidance  Regardless of whether you can identify why you began having panic attacks or whether they seemed to come out of the blue, the places where you began having panic attacks often can become triggers themselves. It is not uncommon for individuals to begin to avoid the places where they have had panic attacks. Over time, the individual may begin to avoid more and  more places, thereby decreasing their activities and often negatively impacting their quality of life. To break the cycle of avoidance, it is important to first identify the places or situations that are being avoided, and then to do some "relearning."  To begin this intervention, first create a list of locations or situations that you tend to avoid. Then choose an avoided location or situation that you would like to target first. Now develop an "exposure hierarchy" for this situation or location. An "exposure hierarchy" is a list of actions that make you feel anxious in this situation. Order these actions from least to most anxiety-producing. It is often helpful to have the first item on your hierarchy involve thinking or imagining part of the feared/avoided situation.   Here is an example of an exposure hierarchy for decreasing avoidance of the grocery store. Note how it is ordered from the least amount of anxiety (at the top) to the most anxiety (at the bottom):   Think about going to the grocery store alone.   Go to the grocery store with a friend or family member.   Go to the grocery store alone to pick up a few small items (5-10 minutes in the store).   Shopping for 10-20 minutes in the store alone.   Doing the shopping for the week by myself (20-30 minutes in the store).   Your homework is to "expose" yourself to the lowest item on your hierarchy and use your breathing relaxation and coping statements (see below) to help you remain in the situation. Practice this several times during the upcoming week. Once you have mastered each item with minimal anxiety, move on to the next higher action on your list.   Cognitive Interventions  Identify your negative self-talk Anxious thoughts can increase anxiety symptoms and panic. The first step in changing anxious thinking is to identify your own negative, alarming self-talk. Some common alarming thoughts:  I'm having a heart attack.            I must  be going crazy. I think I'm dying. People will think I'm crazy. I'm going to pass our.  Oh no- here it comes.  I can't stand this.  I've got to get out of here!  2. Use positive coping statements Changing or disrupting a pattern of anxious thoughts by replacing them with more calming or supportive statements can help to divert a panic attack. Some common helpful coping statements:  This is not an emergency.  I don't like feeling this way, but I can accept it.  I can feel like this and still be okay.  This has happened before, and I was okay. I'll be  okay this time, too.  I can be anxious and still deal with this situation.

## 2020-11-28 ENCOUNTER — Other Ambulatory Visit: Payer: Self-pay | Admitting: Women's Health

## 2020-11-28 MED ORDER — HYDROXYZINE PAMOATE 25 MG PO CAPS: 25.0000 mg | ORAL_CAPSULE | Freq: Three times a day (TID) | ORAL | 0 refills | Status: DC | PRN

## 2020-11-29 ENCOUNTER — Encounter: Payer: Self-pay | Admitting: Family Medicine

## 2020-11-29 NOTE — BH Specialist Note (Signed)
Pt did not arrive to video visit and did not answer the phone; Left HIPPA-compliant message to call back Lanecia Sliva from [facility] at  [phone] (Didier Brandenburg's office).  ?; left MyChart message for patient.  ? ?

## 2020-12-07 ENCOUNTER — Ambulatory Visit: Payer: Medicaid Other | Admitting: Clinical

## 2020-12-07 DIAGNOSIS — Z5329 Procedure and treatment not carried out because of patient's decision for other reasons: Secondary | ICD-10-CM

## 2020-12-07 DIAGNOSIS — Z91199 Patient's noncompliance with other medical treatment and regimen due to unspecified reason: Secondary | ICD-10-CM

## 2020-12-30 ENCOUNTER — Telehealth: Payer: Self-pay | Admitting: Nurse Practitioner

## 2020-12-30 DIAGNOSIS — K0889 Other specified disorders of teeth and supporting structures: Secondary | ICD-10-CM

## 2020-12-30 NOTE — Progress Notes (Signed)
Based on what you shared with me, I feel your condition warrants further evaluation as soon as possible at an Emergency department.   E visit and video visits are unable to prescribe stronger pain medication than what you are using now.  The symptoms that you are having may be from pain, infection and could be side effects from the medications you are using.   You need to be evaluated today by a provider in person.  Below are a list of local emergency departments    NOTE: There will be NO CHARGE for this eVisit   If you are having a true medical emergency please call 911.      Emergency [facility]  Get Driving Directions  [id number]  [address]  [place], [place] [address]  Open 24/7/365      [facility] Emergency Department at [place]  [id number]  [place], [place] [address]  Open 24/7/365    Emergency [facility]  Get Driving Directions  [id number]  [address], [place] [address]  Open 24/7/365      [facility]  Get Driving Directions  [id number]  [address]  [place], [place] [address]  Open 24/7/365    [facility]  Emergency [place]  Get Driving Directions  [id number]  [place], [place] [address]  Open 24/7/365    [place]  Get Driving Directions  [id number] [address]  [place], [place] [address]  Open 24/7/365    [facility]  Emergency [facility]  Get Driving Directions  [id number]  [address]  [place],  [address]  Open 24/7/365

## 2021-01-24 DIAGNOSIS — F250 Schizoaffective disorder, bipolar type: Secondary | ICD-10-CM | POA: Diagnosis not present

## 2021-02-05 DIAGNOSIS — F603 Borderline personality disorder: Secondary | ICD-10-CM | POA: Diagnosis not present

## 2021-02-06 DIAGNOSIS — F603 Borderline personality disorder: Secondary | ICD-10-CM | POA: Diagnosis not present

## 2021-03-16 DIAGNOSIS — H6692 Otitis media, unspecified, left ear: Secondary | ICD-10-CM | POA: Diagnosis not present

## 2021-03-16 DIAGNOSIS — M791 Myalgia, unspecified site: Secondary | ICD-10-CM | POA: Diagnosis not present

## 2021-03-16 DIAGNOSIS — Z20822 Contact with and (suspected) exposure to covid-19: Secondary | ICD-10-CM | POA: Diagnosis not present

## 2021-04-01 ENCOUNTER — Emergency Department (HOSPITAL_COMMUNITY): Payer: Medicaid Other

## 2021-04-01 ENCOUNTER — Encounter (HOSPITAL_COMMUNITY): Payer: Self-pay | Admitting: *Deleted

## 2021-04-01 ENCOUNTER — Emergency Department (HOSPITAL_COMMUNITY)
Admission: EM | Admit: 2021-04-01 | Discharge: 2021-04-01 | Disposition: A | Discharge status: Home / Self Care | Payer: Medicaid Other | Attending: Emergency Medicine | Admitting: Emergency Medicine

## 2021-04-01 ENCOUNTER — Other Ambulatory Visit: Payer: Self-pay

## 2021-04-01 DIAGNOSIS — R079 Chest pain, unspecified: Secondary | ICD-10-CM | POA: Diagnosis not present

## 2021-04-01 DIAGNOSIS — Z20822 Contact with and (suspected) exposure to covid-19: Secondary | ICD-10-CM | POA: Diagnosis not present

## 2021-04-01 DIAGNOSIS — Z87891 Personal history of nicotine dependence: Secondary | ICD-10-CM | POA: Diagnosis not present

## 2021-04-01 DIAGNOSIS — J101 Influenza due to other identified influenza virus with other respiratory manifestations: Secondary | ICD-10-CM | POA: Insufficient documentation

## 2021-04-01 DIAGNOSIS — R059 Cough, unspecified: Secondary | ICD-10-CM | POA: Diagnosis not present

## 2021-04-01 DIAGNOSIS — R0789 Other chest pain: Secondary | ICD-10-CM | POA: Diagnosis not present

## 2021-04-01 DIAGNOSIS — J40 Bronchitis, not specified as acute or chronic: Secondary | ICD-10-CM | POA: Diagnosis not present

## 2021-04-01 LAB — RESP PANEL BY RT-PCR (FLU A&B, COVID) ARPGX2
Influenza A by PCR: POSITIVE — AB
Influenza B by PCR: NEGATIVE
SARS Coronavirus 2 by RT PCR: NEGATIVE

## 2021-04-01 MED ORDER — HYDROMORPHONE HCL 1 MG/ML IJ SOLN
0.5000 mg | Freq: Once | INTRAMUSCULAR | Status: AC
  Administered 2021-04-01: 0.5 mg via INTRAVENOUS
  Filled 2021-04-01: qty 1

## 2021-04-01 MED ORDER — HYDROCODONE-ACETAMINOPHEN 5-325 MG PO TABS: ORAL_TABLET | ORAL | 0 refills | Status: DC

## 2021-04-01 MED ORDER — OSELTAMIVIR PHOSPHATE 75 MG PO CAPS: 75.0000 mg | ORAL_CAPSULE | Freq: Two times a day (BID) | ORAL | 0 refills | Status: DC

## 2021-04-01 NOTE — ED Provider Notes (Signed)
[facility] EMERGENCY DEPARTMENT Provider Note   CSN: [id number] Arrival date & time: 04/01/21  0732     History Chief Complaint  Patient presents with   Chest Pain    Colleen Valentine is a 25 y.o. female.  Patient complains of a cough mild aches.  The history is provided by the patient and medical records. No language interpreter was used.  Chest Pain Pain location:  L chest Pain quality: aching   Pain radiates to:  Does not radiate Pain severity:  Moderate Onset quality:  Sudden Timing:  Constant Progression:  Worsening Chronicity:  New Context: breathing   Relieved by:  Nothing Worsened by:  Nothing Associated symptoms: no abdominal pain, no back pain, no cough, no fatigue and no headache       Past Medical History:  Diagnosis Date   Acute pyelonephritis 05/17/2018   Anemia    Anxiety    Complication of anesthesia    Depression    GERD (gastroesophageal reflux disease)    Herpes genitalia    Insomnia    Obesity    Polysubstance abuse ([place]) 04/01/2011   PONV (postoperative nausea and vomiting)    Seizures ([place])    once in 2016 due to alcohol poisioning   Suicide [facility])     Patient Active Problem List   Diagnosis Date Noted   PTSD (post-traumatic stress disorder) 10/05/2020   Irregular menses 09/05/2020   Epidural abscess    Vertebral osteomyelitis ([place])    MRSA bacteremia    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[place]) 08/22/2020   Spinal cord compression ([place]) 08/21/2020   Splenomegaly 08/16/2020   Marijuana abuse 07/29/2020   Anemia in pregnancy 07/29/2020   Gestational diabetes mellitus in third trimester 07/29/2020   Gram-positive cocci bacteremia 07/20/2020   Abscess of axilla, left 07/19/2020   Marijuana user 03/28/2020   Gonorrhea 02/04/2020   Chlamydia infection 01/25/2020   Trichomonas infection 11/28/2019   Esophageal dysphagia 02/27/2017   Current smoker 08/12/2016   Genital herpes simplex 04/08/2016   GERD (gastroesophageal reflux disease) 10/17/2015    Scoliosis 05/04/2013   Spondylolysis 05/04/2013   Anxiety state 10/31/2012   ADHD (attention deficit hyperactivity disorder), combined type 07/21/2012   MDD (major depressive disorder), recurrent episode, moderate ([place]) 04/01/2011   Oppositional defiant disorder 04/01/2011    Past Surgical History:  Procedure Laterality Date   CESAREAN SECTION N/A 08/30/2020   Procedure: CESAREAN SECTION;  Surgeon: Gwynne Edinger, MD;  Location: [facility] LD ORS;  Service: Obstetrics;  Laterality: N/A;   CHOLECYSTECTOMY  2015   COSMETIC SURGERY  Age 75   dog bite to face   IR FLUORO GUIDED NEEDLE PLC ASPIRATION/INJECTION LOC  08/22/2020   IR US GUIDE BX ASP/DRAIN  08/22/2020   LAMINECTOMY N/A 08/22/2020   Procedure: THORACIC TWELVE TO LUMBAR ONELAMINECTOMY FOR DRAINAGE OF EPIDURAL ABSCESS;  Surgeon: Kristeen Miss, MD;  Location: [facility];  Service: Neurosurgery;  Laterality: N/A;     OB History     Gravida  5   Para  2   Term  1   Preterm  1   AB  3   Living  2      SAB  2   IAB  1   Ectopic      Multiple  0   Live Births  2           Family History  Problem Relation Age of Onset   Alcohol abuse Mother    Stroke Mother  Bipolar disorder Mother    Drug abuse Mother    Alcohol abuse Father    Bipolar disorder Father    Drug abuse Father    Colon cancer Maternal Grandfather    Crohn's disease Paternal Grandmother    Crohn's disease Paternal Aunt    Crohn's disease Paternal Aunt    Crohn's disease Paternal Uncle    Colon cancer Paternal Uncle    Cancer Maternal Aunt        "stomach cancer"   Celiac disease Neg Hx     Social History   Tobacco Use   Smoking status: Former    Packs/day: 0.50    Years: 5.00    Pack years: 2.50    Types: Cigarettes    Quit date: 11/24/2019    Years since quitting: 1.3   Smokeless tobacco: Never   Tobacco comments:    one pack cigarettes daily  Vaping Use   Vaping Use: Some days  Substance Use Topics   Alcohol use: Yes    Comment:  social   Drug use: Yes    Types: Marijuana    Home Medications Prior to Admission medications   Medication Sig Start Date End Date Taking? Authorizing Provider  HYDROcodone-acetaminophen (NORCO/VICODIN) 5-325 MG tablet Take 1 every 6 hours for pain not relieved by Motrin and Tylenol. 04/01/21  Yes Milton Ferguson, MD  hydrOXYzine (VISTARIL) 25 MG capsule Take 1 capsule (25 mg total) by mouth 3 (three) times daily as needed for anxiety. 11/28/20   Roma Schanz, CNM  oseltamivir (TAMIFLU) 75 MG capsule Take 1 capsule (75 mg total) by mouth every 12 (twelve) hours. 04/01/21  Yes Milton Ferguson, MD  acetaminophen (TYLENOL) 500 MG tablet Take 1,000 mg by mouth every 6 (six) hours as needed for mild pain or headache.    [provider]  albuterol (VENTOLIN HFA) 108 (90 Base) MCG/ACT inhaler Inhale 2 puffs into the lungs every 6 (six) hours as needed for wheezing or shortness of breath. 10/29/20   Sharion Balloon, FNP  fluticasone (FLONASE) 50 MCG/ACT nasal spray Place 2 sprays into both nostrils daily. 10/28/20   Hassell Done, Mary-Margaret, FNP  gabapentin (NEURONTIN) 100 MG capsule Take 1 capsule (100 mg total) by mouth 3 (three) times daily. 10/05/20   Sharion Balloon, FNP  lamoTRIgine (LAMICTAL) 100 MG tablet Take 1 tablet (100 mg total) by mouth daily. 10/05/20   Sharion Balloon, FNP  omeprazole (PRILOSEC) 20 MG capsule Take 1 capsule (20 mg total) by mouth daily. 10/05/20   Sharion Balloon, FNP  valACYclovir (VALTREX) 500 MG tablet Take 1 tablet (500 mg total) by mouth daily. 10/05/20   Sharion Balloon, FNP  pantoprazole (PROTONIX) 40 MG tablet Take 1 tablet (40 mg total) by mouth daily before breakfast. 06/25/18 01/14/19  Baruch Gouty, FNP    Allergies    Patient has no known allergies.  Review of Systems   Review of Systems  Constitutional:  Negative for appetite change and fatigue.  HENT:  Negative for congestion, ear discharge and sinus pressure.   Eyes:  Negative for discharge.   Respiratory:  Negative for cough.   Cardiovascular:  Positive for chest pain.  Gastrointestinal:  Negative for abdominal pain and diarrhea.  Genitourinary:  Negative for frequency and hematuria.  Musculoskeletal:  Negative for back pain.  Skin:  Negative for rash.  Neurological:  Negative for seizures and headaches.  Psychiatric/Behavioral:  Negative for hallucinations.    Physical Exam Updated Vital Signs BP  127/83   Pulse 67   Temp 98.4 F (36.9 C) (Oral)   Resp 16   Ht 5\' 5"  (1.651 m)   Wt 105.2 kg   LMP 03/23/2021   SpO2 100%   Breastfeeding No   BMI 38.61 kg/m   Physical Exam Vitals and nursing note reviewed.  Constitutional:      Appearance: She is well-developed.  HENT:     Head: Normocephalic.     Nose: Nose normal.  Eyes:     General: No scleral icterus.    Conjunctiva/sclera: Conjunctivae normal.  Neck:     Thyroid: No thyromegaly.  Cardiovascular:     Rate and Rhythm: Normal rate and regular rhythm.     Heart sounds: No murmur heard.   No friction rub. No gallop.  Pulmonary:     Breath sounds: No stridor. No wheezing or rales.  Chest:     Chest wall: No tenderness.  Abdominal:     General: There is no distension.     Tenderness: There is no abdominal tenderness. There is no rebound.  Musculoskeletal:        General: Normal range of motion.     Cervical back: Neck supple.  Lymphadenopathy:     Cervical: No cervical adenopathy.  Skin:    Findings: No erythema or rash.  Neurological:     Mental Status: She is alert and oriented to person, place, and time.     Motor: No abnormal muscle tone.     Coordination: Coordination normal.  Psychiatric:        Behavior: Behavior normal.    ED Results / Procedures / Treatments   Labs (all labs ordered are listed, but only abnormal results are displayed) Labs Reviewed  RESP PANEL BY RT-PCR (FLU A&B, COVID) ARPGX2 - Abnormal; Notable for the following components:      Result Value   Influenza A by PCR  POSITIVE (*)    All other components within normal limits    EKG None  Radiology DG Chest Port 1 View  Result Date: 04/01/2021 CLINICAL DATA:  Chest pain worsening with coughing. EXAM: PORTABLE CHEST 1 VIEW COMPARISON:  September 06, 2020 FINDINGS: The heart size and mediastinal contours are within normal limits. Both lungs are clear. The visualized skeletal structures are unremarkable. IMPRESSION: No active disease. Electronically Signed   By: Fidela Salisbury M.D.   On: 04/01/2021 09:30    Procedures Procedures   Medications Ordered in ED Medications  HYDROmorphone (DILAUDID) injection 0.5 mg (0.5 mg Intravenous Given 04/01/21 0810)    ED Course  I have reviewed the triage vital signs and the nursing notes.  Pertinent labs & imaging results that were available during my care of the patient were reviewed by me and considered in my medical decision making (see chart for details).    MDM Rules/Calculators/A&P                           Patient with influenza.  She is given Tamiflu and Vicodin and will follow up with PCP Final Clinical Impression(s) / ED Diagnoses Final diagnoses:  None    Rx / DC Orders ED Discharge Orders          Ordered    oseltamivir (TAMIFLU) 75 MG capsule  Every 12 hours        04/01/21 1031    HYDROcodone-acetaminophen (NORCO/VICODIN) 5-325 MG tablet        04/01/21 1031  Milton Ferguson, MD 04/01/21 1035

## 2021-04-01 NOTE — Discharge Instructions (Signed)
Drink fluids take Tylenol Motrin follow-up if not improving

## 2021-04-01 NOTE — ED Triage Notes (Signed)
Pt c/o mid chest pain, worse with coughing and deep breathing, productive cough, persistent high fevers and generalized weakness x 2 days. Pt reports her children have recently been diagnosed with Covid, RSV and Flu.

## 2021-04-14 ENCOUNTER — Telehealth: Payer: Medicaid Other | Admitting: Nurse Practitioner

## 2021-04-14 DIAGNOSIS — A6000 Herpesviral infection of urogenital system, unspecified: Secondary | ICD-10-CM | POA: Diagnosis not present

## 2021-04-14 DIAGNOSIS — B3731 Acute candidiasis of vulva and vagina: Secondary | ICD-10-CM | POA: Diagnosis not present

## 2021-04-14 MED ORDER — FLUCONAZOLE 150 MG PO TABS: 150.0000 mg | ORAL_TABLET | Freq: Once | ORAL | 0 refills | Status: AC

## 2021-04-14 MED ORDER — VALACYCLOVIR HCL 1 G PO TABS: 1000.0000 mg | ORAL_TABLET | Freq: Every day | ORAL | 0 refills | Status: AC

## 2021-04-14 NOTE — Progress Notes (Signed)

## 2021-04-14 NOTE — Progress Notes (Signed)
I have spent 5 minutes in review of e-visit questionnaire, review and updating patient chart, medical decision making and response to patient.  ° °[place] Shall W Jazzmon Prindle, NP ° °  °

## 2021-04-16 ENCOUNTER — Encounter: Payer: Self-pay | Admitting: Nurse Practitioner

## 2021-04-16 ENCOUNTER — Telehealth: Payer: Medicaid Other | Admitting: Physician Assistant

## 2021-04-16 ENCOUNTER — Telehealth (INDEPENDENT_AMBULATORY_CARE_PROVIDER_SITE_OTHER): Payer: Medicaid Other | Admitting: Nurse Practitioner

## 2021-04-16 DIAGNOSIS — J039 Acute tonsillitis, unspecified: Secondary | ICD-10-CM

## 2021-04-16 DIAGNOSIS — J019 Acute sinusitis, unspecified: Secondary | ICD-10-CM

## 2021-04-16 DIAGNOSIS — B9689 Other specified bacterial agents as the cause of diseases classified elsewhere: Secondary | ICD-10-CM

## 2021-04-16 MED ORDER — CEFDINIR 300 MG PO CAPS: 300.0000 mg | ORAL_CAPSULE | Freq: Two times a day (BID) | ORAL | 0 refills | Status: DC

## 2021-04-16 MED ORDER — PREDNISONE 20 MG PO TABS: 40.0000 mg | ORAL_TABLET | Freq: Every day | ORAL | 0 refills | Status: AC

## 2021-04-16 NOTE — Progress Notes (Addendum)
Virtual Visit Consent   Colleen Valentine, you are scheduled for a virtual visit with Colleen Valentine, [place], a [facility] provider, today.     Just as with appointments in the office, your consent must be obtained to participate.  Your consent will be active for this visit and any virtual visit you may have with one of our providers in the next 365 days.     If you have a MyChart account, a copy of this consent can be sent to you electronically.  All virtual visits are billed to your insurance company just like a traditional visit in the office.    As this is a virtual visit, video technology does not allow for your provider to perform a traditional examination.  This may limit your provider's ability to fully assess your condition.  If your provider identifies any concerns that need to be evaluated in person or the need to arrange testing (such as labs, EKG, etc.), we will make arrangements to do so.     Although advances in technology are sophisticated, we cannot ensure that it will always work on either your end or our end.  If the connection with a video visit is poor, the visit may have to be switched to a telephone visit.  With either a video or telephone visit, we are not always able to ensure that we have a secure connection.     I need to obtain your verbal consent now.   Are you willing to proceed with your visit today? YES   Colleen Valentine has provided verbal consent on 04/16/2021 for a virtual visit (video or telephone).   Colleen Hassell Done, FNP   Date: 04/16/2021 1:46 PM   Virtual Visit via Video Note   I, Colleen Valentine, connected with Colleen Valentine ([id number], [birth year]) on 04/16/21 at  3:30 PM EST by a video-enabled telemedicine application and verified that I am speaking with the correct person using two identifiers.  Location: Patient: Virtual Visit Location Patient: Home Provider: Virtual Visit Location Provider: Mobile   I discussed the limitations of  evaluation and management by telemedicine and the availability of in person appointments. The patient expressed understanding and agreed to proceed.    History of Present Illness: Colleen Valentine is a 25 y.o. who identifies as a female who was assigned female at birth, and is being seen today for sore throat.  HPI: Patient has swollen tonsils and sore throat. She says she gets tonsil stones frequently and that usually leads to tonsillitis. Neck feelsenlarged .  Review of Systems  Constitutional:  Negative for chills, fever and malaise/fatigue.  HENT:  Positive for congestion and sore throat.   Respiratory:  Negative for cough, shortness of breath and wheezing.   Musculoskeletal:  Negative for myalgias.  Neurological:  Negative for dizziness and headaches.   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[place]) 08/22/2020   Spinal cord compression ([place]) 08/21/2020   Splenomegaly 08/16/2020   Marijuana abuse 07/29/2020   Anemia in pregnancy 07/29/2020   Gestational diabetes mellitus in third trimester 07/29/2020   Gram-positive cocci bacteremia 07/20/2020   Abscess of axilla, left 07/19/2020   Marijuana user 03/28/2020   Gonorrhea 02/04/2020   Chlamydia infection 01/25/2020   Trichomonas infection 11/28/2019   Esophageal dysphagia 02/27/2017  Current smoker 08/12/2016   Genital herpes simplex 04/08/2016   GERD (gastroesophageal reflux disease) 10/17/2015   Scoliosis 05/04/2013   Spondylolysis 05/04/2013   Anxiety state 10/31/2012   ADHD (attention deficit hyperactivity disorder), combined type 07/21/2012   MDD (major depressive disorder), recurrent episode, moderate ([place]) 04/01/2011   Oppositional defiant disorder 04/01/2011    Allergies: No Known Allergies Medications:  Current Outpatient  Medications:    cefdinir (OMNICEF) 300 MG capsule, Take 1 capsule (300 mg total) by mouth 2 (two) times daily. 1 po BID, Disp: 20 capsule, Rfl: 0   hydrOXYzine (VISTARIL) 25 MG capsule, Take 1 capsule (25 mg total) by mouth 3 (three) times daily as needed for anxiety., Disp: 30 capsule, Rfl: 0   predniSONE (DELTASONE) 20 MG tablet, Take 2 tablets (40 mg total) by mouth daily with breakfast for 5 days. 2 po daily for 5 days, Disp: 10 tablet, Rfl: 0   acetaminophen (TYLENOL) 500 MG tablet, Take 1,000 mg by mouth every 6 (six) hours as needed for mild pain or headache., Disp: , Rfl:    albuterol (VENTOLIN HFA) 108 (90 Base) MCG/ACT inhaler, Inhale 2 puffs into the lungs every 6 (six) hours as needed for wheezing or shortness of breath., Disp: 8 g, Rfl: 0   fluticasone (FLONASE) 50 MCG/ACT nasal spray, Place 2 sprays into both nostrils daily., Disp: 16 g, Rfl: 6   gabapentin (NEURONTIN) 100 MG capsule, Take 1 capsule (100 mg total) by mouth 3 (three) times daily., Disp: 90 capsule, Rfl: 3   HYDROcodone-acetaminophen (NORCO/VICODIN) 5-325 MG tablet, Take 1 every 6 hours for pain not relieved by Motrin and Tylenol., Disp: 10 tablet, Rfl: 0   lamoTRIgine (LAMICTAL) 100 MG tablet, Take 1 tablet (100 mg total) by mouth daily., Disp: 90 tablet, Rfl: 1   omeprazole (PRILOSEC) 20 MG capsule, Take 1 capsule (20 mg total) by mouth daily., Disp: 90 capsule, Rfl: 1   oseltamivir (TAMIFLU) 75 MG capsule, Take 1 capsule (75 mg total) by mouth every 12 (twelve) hours., Disp: 10 capsule, Rfl: 0   valACYclovir (VALTREX) 1000 MG tablet, Take 1 tablet (1,000 mg total) by mouth daily., Disp: 30 tablet, Rfl: 0  Current Facility-Administered Medications:    ibuprofen (ADVIL) tablet 600 mg, 600 mg, Oral, Q6H PRN, Janyth Pupa, DO  Observations/Objective: Patient is well-developed, well-nourished in no acute distress.  Resting comfortably  at home.  Head is normocephalic, atraumatic.  No labored breathing.  Speech is  clear and coherent with logical content.  Patient is alert and oriented at baseline.  Bil tonsillar edema and erythema  Assessment and Plan:  Colleen Lubin in today with chief complaint of No chief complaint on file.   1. Tonsillitis Force fluids Motrin or tylenol OTC OTC decongestant Throat lozenges if help New toothbrush in 3 days Meds ordered this encounter  Medications   predniSONE (DELTASONE) 20 MG tablet    Sig: Take 2 tablets (40 mg total) by mouth daily with breakfast for 5 days. 2 po daily for 5 days    Dispense:  10 tablet    Refill:  0    Order Specific Question:   Supervising Provider    Answer:   Caryl Pina A [[id number]]   cefdinir (OMNICEF) 300 MG capsule    Sig: Take 1 capsule (300 mg total) by mouth 2 (two) times daily. 1 po BID    Dispense:  20 capsule    Refill:  0    Order Specific Question:   Supervising Provider  Answer:   Caryl Pina A [[id number]]     Follow Up Instructions: I discussed the assessment and treatment plan with the patient. The patient was provided an opportunity to ask questions and all were answered. The patient agreed with the plan and demonstrated an understanding of the instructions.  A copy of instructions were sent to the patient via MyChart.  The patient was advised to call back or seek an in-person evaluation if the symptoms worsen or if the condition fails to improve as anticipated.  Time:  I spent 11 minutes with the patient via telehealth technology discussing the above problems/concerns.    Colleen Hassell Done, FNP

## 2021-04-16 NOTE — Progress Notes (Signed)

## 2021-04-20 ENCOUNTER — Telehealth: Payer: Medicaid Other | Admitting: Emergency Medicine

## 2021-04-20 NOTE — Progress Notes (Signed)
Pt with 2 virtual visits this week for infection, prescribed 2 different antibiotics plus prednisone. Given pt's persistent and worsening symptoms, I believe she needs an inperson visit;.  Willeen Cass, PhD, FNP-BC

## 2021-04-27 ENCOUNTER — Telehealth: Payer: Medicaid Other | Admitting: Physician Assistant

## 2021-04-27 DIAGNOSIS — B9789 Other viral agents as the cause of diseases classified elsewhere: Secondary | ICD-10-CM

## 2021-04-27 DIAGNOSIS — J019 Acute sinusitis, unspecified: Secondary | ICD-10-CM | POA: Diagnosis not present

## 2021-04-27 MED ORDER — IPRATROPIUM BROMIDE 0.03 % NA SOLN: 2.0000 | Freq: Two times a day (BID) | NASAL | 0 refills | Status: DC

## 2021-04-27 MED ORDER — FLUTICASONE PROPIONATE 50 MCG/ACT NA SUSP: 2.0000 | Freq: Every day | NASAL | 0 refills | Status: DC

## 2021-04-27 NOTE — Progress Notes (Signed)
E-Visit for Sinus Problems  We are sorry that you are not feeling well.  Here is how we plan to help!  Based on what you have shared with me it looks like you have sinusitis.  Sinusitis is inflammation and infection in the sinus cavities of the head.  Based on your presentation I believe you most likely have Acute Viral Sinusitis.This is an infection most likely caused by a virus. There is not specific treatment for viral sinusitis other than to help you with the symptoms until the infection runs its course.  You may use an oral decongestant such as Mucinex D or if you have glaucoma or high blood pressure use plain Mucinex. Saline nasal spray help and can safely be used as often as needed for congestion, I have prescribed: Fluticasone nasal spray two sprays in each nostril once a day and Ipratropium Bromide nasal spray 0.03% 2 sprays in eah nostril 2-3 times a day  Some authorities believe that zinc sprays or the use of Echinacea may shorten the course of your symptoms.  Sinus infections are not as easily transmitted as other respiratory infection, however we still recommend that you avoid close contact with loved ones, especially the very young and elderly.  Remember to wash your hands thoroughly throughout the day as this is the number one way to prevent the spread of infection!  Home Care: Only take medications as instructed by your medical team. Do not take these medications with alcohol. A steam or ultrasonic humidifier can help congestion.  You can place a towel over your head and breathe in the steam from hot water coming from a faucet. Avoid close contacts especially the very young and the elderly. Cover your mouth when you cough or sneeze. Always remember to wash your hands.  Get Help Right Away If: You develop worsening fever or sinus pain. You develop a severe head ache or visual changes. Your symptoms persist after you have completed your treatment plan.  Make sure you Understand  these instructions. Will watch your condition. Will get help right away if you are not doing well or get worse.   Thank you for choosing an e-visit.  Your e-visit answers were reviewed by a board certified advanced clinical practitioner to complete your personal care plan. Depending upon the condition, your plan could have included both over the counter or prescription medications.  Please review your pharmacy choice. Make sure the pharmacy is open so you can pick up prescription now. If there is a problem, you may contact your provider through MyChart messaging and have the prescription routed to another pharmacy.  Your safety is important to us. If you have drug allergies check your prescription carefully.   For the next 24 hours you can use MyChart to ask questions about today's visit, request a non-urgent call back, or ask for a work or school excuse. You will get an email in the next two days asking about your experience. I hope that your e-visit has been valuable and will speed your recovery.  I provided 5 minutes of non face-to-face time during this encounter for chart review and documentation.   

## 2021-04-30 ENCOUNTER — Telehealth: Payer: Self-pay | Admitting: Obstetrics & Gynecology

## 2021-04-30 ENCOUNTER — Telehealth: Payer: Self-pay

## 2021-04-30 NOTE — Telephone Encounter (Signed)
Spoke with patient and relayed that it would be best for her to go to the ED. She says she will go to the [facility] emergency department. Advised her that if there is an infectious process noted on imaging that the hospitalist will consult ID for follow up. Patient verbalized understanding and has no further questions.   Beryle Flock, RN

## 2021-04-30 NOTE — Telephone Encounter (Signed)
Patient is having same symptoms as she was before her surgery. She had an THORACIC TWELVE TO LUMBAR ONELAMINECTOMY FOR DRAINAGE OF EPIDURAL ABSCESS which [facility] assisted. She's also losing control of bladder, back spasms and very weak and tired.

## 2021-04-30 NOTE — Telephone Encounter (Signed)
Called pt for clarification. No answer, left vm advising her to seek help from the surgeon who performed the laminectomy or the neurosurgeon for f/u. Dr Nelda Marseille was most likely only present d/t the pregnancy at the time. Left msg to call back if she had any questions or concerns.

## 2021-04-30 NOTE — Telephone Encounter (Signed)
Patient called and said she is worried her back infection is recurring. She is having symptoms of back spasms, increased pain, some loss of bladder control, balance issues, and fatigue. Denies fever and chills except for when she had the flu recently. RN offered her appointment with a provider for tomorrow. She would like to know if Dr. Juleen China can go ahead and order an MRI or if she needs to be seen first. Will route to provider.    Beryle Flock, RN

## 2021-05-01 ENCOUNTER — Ambulatory Visit: Payer: Medicaid Other | Admitting: Nurse Practitioner

## 2021-05-02 ENCOUNTER — Encounter (HOSPITAL_COMMUNITY): Payer: Self-pay

## 2021-05-02 ENCOUNTER — Emergency Department (HOSPITAL_COMMUNITY)
Admission: EM | Admit: 2021-05-02 | Discharge: 2021-05-02 | Discharge status: Absent without leave | Payer: Medicaid Other | Source: Home / Self Care

## 2021-05-02 ENCOUNTER — Emergency Department (HOSPITAL_COMMUNITY): Payer: Medicaid Other

## 2021-05-02 ENCOUNTER — Emergency Department (HOSPITAL_COMMUNITY)
Admission: EM | Admit: 2021-05-02 | Discharge: 2021-05-03 | Disposition: A | Discharge status: Home / Self Care | Payer: Medicaid Other | Attending: Emergency Medicine | Admitting: Emergency Medicine

## 2021-05-02 DIAGNOSIS — R350 Frequency of micturition: Secondary | ICD-10-CM | POA: Insufficient documentation

## 2021-05-02 DIAGNOSIS — M549 Dorsalgia, unspecified: Secondary | ICD-10-CM | POA: Diagnosis not present

## 2021-05-02 DIAGNOSIS — R000 Tachycardia, unspecified: Secondary | ICD-10-CM | POA: Diagnosis not present

## 2021-05-02 DIAGNOSIS — M79605 Pain in left leg: Secondary | ICD-10-CM | POA: Diagnosis not present

## 2021-05-02 DIAGNOSIS — M4317 Spondylolisthesis, lumbosacral region: Secondary | ICD-10-CM | POA: Diagnosis not present

## 2021-05-02 DIAGNOSIS — M4316 Spondylolisthesis, lumbar region: Secondary | ICD-10-CM | POA: Diagnosis not present

## 2021-05-02 DIAGNOSIS — R531 Weakness: Secondary | ICD-10-CM | POA: Diagnosis not present

## 2021-05-02 DIAGNOSIS — Z20822 Contact with and (suspected) exposure to covid-19: Secondary | ICD-10-CM | POA: Insufficient documentation

## 2021-05-02 DIAGNOSIS — M545 Low back pain, unspecified: Secondary | ICD-10-CM | POA: Diagnosis not present

## 2021-05-02 DIAGNOSIS — N9489 Other specified conditions associated with female genital organs and menstrual cycle: Secondary | ICD-10-CM | POA: Diagnosis not present

## 2021-05-02 DIAGNOSIS — Z79899 Other long term (current) drug therapy: Secondary | ICD-10-CM | POA: Insufficient documentation

## 2021-05-02 DIAGNOSIS — R29898 Other symptoms and signs involving the musculoskeletal system: Secondary | ICD-10-CM | POA: Diagnosis not present

## 2021-05-02 DIAGNOSIS — Z87891 Personal history of nicotine dependence: Secondary | ICD-10-CM | POA: Diagnosis not present

## 2021-05-02 DIAGNOSIS — R52 Pain, unspecified: Secondary | ICD-10-CM

## 2021-05-02 LAB — COMPREHENSIVE METABOLIC PANEL
ALT: 20 U/L (ref 0–44)
AST: 22 U/L (ref 15–41)
Albumin: 3.5 g/dL (ref 3.5–5.0)
Alkaline Phosphatase: 73 U/L (ref 38–126)
Anion gap: 11 (ref 5–15)
BUN: 9 mg/dL (ref 6–20)
CO2: 22 mmol/L (ref 22–32)
Calcium: 8.9 mg/dL (ref 8.9–10.3)
Chloride: 102 mmol/L (ref 98–111)
Creatinine, Ser: 0.71 mg/dL (ref 0.44–1.00)
GFR, Estimated: >60 mL/min (ref >60)
Glucose, Bld: 101 mg/dL — ABNORMAL HIGH (ref 70–99)
Potassium: 3.6 mmol/L (ref 3.5–5.1)
Sodium: 135 mmol/L (ref 135–145)
Total Bilirubin: 1 mg/dL (ref 0.3–1.2)
Total Protein: 7.2 g/dL (ref 6.5–8.1)

## 2021-05-02 LAB — PROTIME-INR
INR: 0.9 (ref 0.8–1.2)
Prothrombin Time: 12.4 seconds (ref 11.4–15.2)

## 2021-05-02 LAB — URINALYSIS, ROUTINE W REFLEX MICROSCOPIC
Bilirubin Urine: NEGATIVE
Glucose, UA: NEGATIVE mg/dL
Hgb urine dipstick: NEGATIVE
Ketones, ur: NEGATIVE mg/dL
Leukocytes,Ua: NEGATIVE
Nitrite: NEGATIVE
Protein, ur: NEGATIVE mg/dL
Specific Gravity, Urine: 1.02 (ref 1.005–1.030)
pH: 7 (ref 5.0–8.0)

## 2021-05-02 LAB — CBC WITH DIFFERENTIAL/PLATELET
Abs Immature Granulocytes: 0.03 10*3/uL (ref 0.00–0.07)
Basophils Absolute: 0 10*3/uL (ref 0.0–0.1)
Basophils Relative: 0 %
Eosinophils Absolute: 0.1 10*3/uL (ref 0.0–0.5)
Eosinophils Relative: 1 %
HCT: 39.4 % (ref 36.0–46.0)
Hemoglobin: 12.7 g/dL (ref 12.0–15.0)
Immature Granulocytes: 0 %
Lymphocytes Relative: 15 %
Lymphs Abs: 1 10*3/uL (ref 0.7–4.0)
MCH: 28.7 pg (ref 26.0–34.0)
MCHC: 32.2 g/dL (ref 30.0–36.0)
MCV: 89.1 fL (ref 80.0–100.0)
Monocytes Absolute: 0.2 10*3/uL (ref 0.1–1.0)
Monocytes Relative: 3 %
Neutro Abs: 5.7 10*3/uL (ref 1.7–7.7)
Neutrophils Relative %: 81 %
Platelets: 257 10*3/uL (ref 150–400)
RBC: 4.42 MIL/uL (ref 3.87–5.11)
RDW: 14.3 % (ref 11.5–15.5)
WBC: 7.1 10*3/uL (ref 4.0–10.5)
nRBC: 0 % (ref 0.0–0.2)

## 2021-05-02 LAB — RESP PANEL BY RT-PCR (FLU A&B, COVID) ARPGX2
Influenza A by PCR: NEGATIVE
Influenza B by PCR: NEGATIVE
SARS Coronavirus 2 by RT PCR: NEGATIVE

## 2021-05-02 LAB — LACTIC ACID, PLASMA: Lactic Acid, Venous: 0.7 mmol/L (ref 0.5–1.9)

## 2021-05-02 LAB — APTT: aPTT: 30 seconds (ref 24–36)

## 2021-05-02 LAB — I-STAT BETA HCG BLOOD, ED (MC, WL, AP ONLY): I-stat hCG, quantitative: 6.9 m[IU]/mL — ABNORMAL HIGH (ref <5)

## 2021-05-02 NOTE — ED Provider Notes (Signed)
Emergency Medicine Provider Triage Evaluation Note  Colleen Valentine , a 25 y.o. female  was evaluated in triage.  Pt complains of gradual onset, constant, diffuse midline cervical, thoracic, lumbar pain for the past 3 to 4 days.  Patient also complains of weakness in her legs as well as a tingling sensation to her lower legs.  She states that she is having urinary and bowel incontinence.  She reports history of epidural abscess last year while she was 8 months pregnant.  She denies any IV drug use.  She states that she was not using IV drugs when she was diagnosed with epidural abscess and is unsure what the causes.  She attempted to call her infectious disease doctor however they advised she come to the ED for further evaluation.  He does mention she had the flu about 2 months ago.  She denies any cough, congestion.  She does report she is having a headache as well today.  Review of Systems  Positive: + fevers, low back pain, urinary/bowel incontinence, leg weakness Negative: - arm weakness, vision changes, saddle anesthesia  Physical Exam  BP 116/73 (BP Location: Left Arm)   Pulse (!) 109   Temp 99.1 F (37.3 C) (Oral)   Resp 18   Ht 5\' 5"  (1.651 m)   Wt 105.2 kg   LMP 04/26/2021   SpO2 97%   BMI 38.61 kg/m  Gen:   Awake, no distress   Resp:  Normal effort  MSK:   Moves extremities without difficulty  Other:  + diffuse C, T, and L midline spinal TTP. ROM intact to neck and back. Equal strength to BUEs. Strength equal to BLEs however subjective decreased sensation to lower legs. Pateller reflexes intact.   Medical Decision Making  Medically screening exam initiated at 9:09 PM.  Appropriate orders placed.  Colleen Valentine was informed that the remainder of the evaluation will be completed by another provider, this initial triage assessment does not replace that evaluation, and the importance of remaining in the ED until their evaluation is complete.     Colleen Maize, PA-C 05/02/21  2113    Colleen Muskrat, MD 05/02/21 2139

## 2021-05-02 NOTE — ED Triage Notes (Signed)
Pt arrives POV for eval of back pain, fever, chills, malaise. Pt reports blt leg weakness, equal in nature. Had surgery in March for an epidural abscess, states she feels similar to that time. Reports her ID MD sent her here for further evaluation.

## 2021-05-03 ENCOUNTER — Emergency Department (HOSPITAL_COMMUNITY): Payer: Medicaid Other

## 2021-05-03 DIAGNOSIS — M549 Dorsalgia, unspecified: Secondary | ICD-10-CM | POA: Diagnosis not present

## 2021-05-03 DIAGNOSIS — M4316 Spondylolisthesis, lumbar region: Secondary | ICD-10-CM | POA: Diagnosis not present

## 2021-05-03 DIAGNOSIS — M4317 Spondylolisthesis, lumbosacral region: Secondary | ICD-10-CM | POA: Diagnosis not present

## 2021-05-03 DIAGNOSIS — R29898 Other symptoms and signs involving the musculoskeletal system: Secondary | ICD-10-CM | POA: Diagnosis not present

## 2021-05-03 DIAGNOSIS — M545 Low back pain, unspecified: Secondary | ICD-10-CM | POA: Diagnosis not present

## 2021-05-03 LAB — RAPID URINE DRUG SCREEN, HOSP PERFORMED
Amphetamines: NOT DETECTED
Barbiturates: NOT DETECTED
Benzodiazepines: NOT DETECTED
Cocaine: NOT DETECTED
Opiates: NOT DETECTED
Tetrahydrocannabinol: POSITIVE — AB

## 2021-05-03 LAB — HCG, QUANTITATIVE, PREGNANCY: hCG, Beta Chain, Quant, S: <1 m[IU]/mL (ref <5)

## 2021-05-03 MED ORDER — HYDROMORPHONE HCL 1 MG/ML IJ SOLN
1.0000 mg | Freq: Once | INTRAMUSCULAR | Status: AC
  Administered 2021-05-03: 1 mg via INTRAVENOUS
  Filled 2021-05-03: qty 1

## 2021-05-03 MED ORDER — ALUM & MAG HYDROXIDE-SIMETH 200-200-20 MG/5ML PO SUSP
30.0000 mL | Freq: Once | ORAL | Status: AC
  Administered 2021-05-03: 30 mL via ORAL
  Filled 2021-05-03: qty 30

## 2021-05-03 MED ORDER — GADOBUTROL 1 MMOL/ML IV SOLN
10.0000 mL | Freq: Once | INTRAVENOUS | Status: AC | PRN
  Administered 2021-05-03: 10 mL via INTRAVENOUS

## 2021-05-03 MED ORDER — HYDROMORPHONE HCL 1 MG/ML IJ SOLN
1.0000 mg | Freq: Once | INTRAMUSCULAR | Status: AC | PRN
  Administered 2021-05-03: 1 mg via INTRAVENOUS
  Filled 2021-05-03: qty 1

## 2021-05-03 MED ORDER — LACTATED RINGERS IV BOLUS
1000.0000 mL | Freq: Once | INTRAVENOUS | Status: AC
  Administered 2021-05-03: 1000 mL via INTRAVENOUS

## 2021-05-03 MED ORDER — METOCLOPRAMIDE HCL 5 MG/ML IJ SOLN
10.0000 mg | Freq: Once | INTRAMUSCULAR | Status: AC
  Administered 2021-05-03: 10 mg via INTRAVENOUS
  Filled 2021-05-03: qty 2

## 2021-05-03 NOTE — Discharge Instructions (Signed)
As discussed, your evaluation today has been largely reassuring.  But, it is important that you monitor your condition carefully, and do not hesitate to return to the ED if you develop new, or concerning changes in your condition. ? ?Otherwise, please follow-up with your physician for appropriate ongoing care. ? ?

## 2021-05-03 NOTE — ED Provider Notes (Signed)
Colleen Valentine   CSN: [id number] Arrival date & time: 05/02/21  2052     History Chief Complaint  Patient presents with   Back Pain    Colleen Valentine is a 25 y.o. female.  HPI 25 year old female presents with back pain and leg weakness.  She is concerned about recurrent epidural abscess.  In March of this year while she was 8 months pregnant she developed an abscess and ultimately had to go to the operating room.  She denies any illicit drug use besides marijuana or what she has been prescribed.  She states that starting 3 days ago she has developed severe low back pain that is now up into her neck.  Started at her prior incision site.  She has felt hot but no fevers besides a low-grade temperature.  She is also having a headache that started today.  All of these are very similar symptoms to before.  Since onset she is also been having lower extremity weakness.  She feels like the left leg is a little worse than the right.  She endorses primarily in her lower legs.  Her lower legs also feel "fuzzy".  She is able to ambulate.  She has had some urinary and bowel incontinence though denies saddle anesthesia. She denies ascending weakness, it seemed to come on all of a sudden and stay.  Past Medical History:  Diagnosis Date   Acute pyelonephritis 05/17/2018   Anemia    Anxiety    Complication of anesthesia    Depression    GERD (gastroesophageal reflux disease)    Herpes genitalia    Insomnia    Obesity    Polysubstance abuse ([place]) 04/01/2011   PONV (postoperative nausea and vomiting)    Seizures ([place])    once in 2016 due to alcohol poisioning   Suicide [facility])     Patient Active Problem List   Diagnosis Date Noted   PTSD (post-traumatic stress disorder) 10/05/2020   Irregular menses 09/05/2020   Epidural abscess    Vertebral osteomyelitis ([place])    MRSA bacteremia    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[place]) 08/22/2020   Spinal cord compression ([place])  08/21/2020   Splenomegaly 08/16/2020   Marijuana abuse 07/29/2020   Anemia in pregnancy 07/29/2020   Gestational diabetes mellitus in third trimester 07/29/2020   Gram-positive cocci bacteremia 07/20/2020   Abscess of axilla, left 07/19/2020   Marijuana user 03/28/2020   Gonorrhea 02/04/2020   Chlamydia infection 01/25/2020   Trichomonas infection 11/28/2019   Esophageal dysphagia 02/27/2017   Current smoker 08/12/2016   Genital herpes simplex 04/08/2016   GERD (gastroesophageal reflux disease) 10/17/2015   Scoliosis 05/04/2013   Spondylolysis 05/04/2013   Anxiety state 10/31/2012   ADHD (attention deficit hyperactivity disorder), combined type 07/21/2012   MDD (major depressive disorder), recurrent episode, moderate ([place]) 04/01/2011   Oppositional defiant disorder 04/01/2011    Past Surgical History:  Procedure Laterality Date   CESAREAN SECTION N/A 08/30/2020   Procedure: CESAREAN SECTION;  Surgeon: Gwynne Edinger, MD;  Location: [facility];  Service: Obstetrics;  Laterality: N/A;   CHOLECYSTECTOMY  2015   COSMETIC SURGERY  Age 47   dog bite to face   IR FLUORO GUIDED NEEDLE PLC ASPIRATION/INJECTION LOC  08/22/2020   IR US GUIDE BX ASP/DRAIN  08/22/2020   LAMINECTOMY N/A 08/22/2020   Procedure: THORACIC TWELVE TO LUMBAR ONELAMINECTOMY FOR DRAINAGE OF EPIDURAL ABSCESS;  Surgeon: Kristeen Miss, MD;  Location: [place];  Service:  Neurosurgery;  Laterality: N/A;     OB History     Gravida  5   Para  2   Term  1   Preterm  1   AB  3   Living  2      SAB  2   IAB  1   Ectopic      Multiple  0   Live Births  2           Family History  Problem Relation Age of Onset   Alcohol abuse Mother    Stroke Mother    Bipolar disorder Mother    Drug abuse Mother    Alcohol abuse Father    Bipolar disorder Father    Drug abuse Father    Colon cancer Maternal Grandfather    Crohn's disease Paternal Grandmother    Crohn's disease Paternal Aunt    Crohn's disease  Paternal Aunt    Crohn's disease Paternal Uncle    Colon cancer Paternal Uncle    Cancer Maternal Aunt        "stomach cancer"   Celiac disease Neg Hx     Social History   Tobacco Use   Smoking status: Former    Packs/day: 0.50    Years: 5.00    Pack years: 2.50    Types: Cigarettes    Quit date: 11/24/2019    Years since quitting: 1.4   Smokeless tobacco: Never   Tobacco comments:    one pack cigarettes daily  Vaping Use   Vaping Use: Some days  Substance Use Topics   Alcohol use: Yes    Comment: social   Drug use: Yes    Types: Marijuana    Home Medications Prior to Admission medications   Medication Sig Start Date End Date Taking? Authorizing Provider  acetaminophen (TYLENOL) 500 MG tablet Take 1,000 mg by mouth every 6 (six) hours as needed for mild pain, headache, moderate pain or fever.   Yes [provider]  fluticasone (FLONASE) 50 MCG/ACT nasal spray Place 2 sprays into both nostrils daily. Patient taking differently: Place 2 sprays into both nostrils daily as needed for allergies. 04/27/21  Yes Mar Daring, PA-C  ipratropium (ATROVENT) 0.03 % nasal spray Place 2 sprays into both nostrils every 12 (twelve) hours. Patient taking differently: Place 2 sprays into both nostrils 2 (two) times daily as needed (allergies). 04/27/21  Yes Mar Daring, PA-C  valACYclovir (VALTREX) 1000 MG tablet Take 1 tablet (1,000 mg total) by mouth daily. Patient taking differently: Take 1,000 mg by mouth daily as needed (flare ups). 04/14/21 05/14/21 Yes Gildardo Pounds, NP  pantoprazole (PROTONIX) 40 MG tablet Take 1 tablet (40 mg total) by mouth daily before breakfast. 06/25/18 01/14/19  Baruch Gouty, FNP    Allergies    Patient has no known allergies.  Review of Systems   Review of Systems  Constitutional:  Negative for fever.  Gastrointestinal:  Positive for nausea. Negative for abdominal pain and vomiting.  Musculoskeletal:  Positive for back pain and  neck pain. Negative for neck stiffness.  Neurological:  Positive for weakness, numbness and headaches.  All other systems reviewed and are negative.  Physical Exam Updated Vital Signs BP 115/78 (BP Location: Right Arm)   Pulse 71   Temp 98 F (36.7 C) (Oral)   Resp 16   Ht 5\' 5"  (1.651 m)   Wt 105.2 kg   LMP 04/26/2021   SpO2 100%   BMI 38.61  kg/m   Physical Exam Vitals and nursing Valentine reviewed.  Constitutional:      General: She is not in acute distress.    Appearance: She is well-developed. She is obese. She is not ill-appearing or diaphoretic.  HENT:     Head: Normocephalic and atraumatic.     Right Ear: External ear normal.     Left Ear: External ear normal.     Nose: Nose normal.  Eyes:     General:        Right eye: No discharge.        Left eye: No discharge.     Extraocular Movements: Extraocular movements intact.     Pupils: Pupils are equal, round, and reactive to light.  Cardiovascular:     Rate and Rhythm: Normal rate and regular rhythm.     Heart sounds: Normal heart sounds.  Pulmonary:     Effort: Pulmonary effort is normal.     Breath sounds: Normal breath sounds.  Abdominal:     Palpations: Abdomen is soft.     Tenderness: There is no abdominal tenderness.  Musculoskeletal:     Cervical back: Normal range of motion. No rigidity.       Back:     Comments: Patient has diffuse lumbar, thoracic, cervical midline tenderness to palpation.  Skin:    General: Skin is warm and dry.  Neurological:     Mental Status: She is alert.     Deep Tendon Reflexes:     Reflex Scores:      Patellar reflexes are 2+ on the right side and 2+ on the left side.      Achilles reflexes are 2+ on the right side and 2+ on the left side.    Comments: CN 3-12 grossly intact. 5/5 strength in both upper extremities. 5/5 strength in RLE. LLE seems to be slightly weak compared to right, seems to be ok in lower leg.  Both lower legs have subjective decreased sensation to light  touch.  Normal finger to nose.   Psychiatric:        Mood and Affect: Mood is not anxious.    ED Results / Procedures / Treatments   Labs (all labs ordered are listed, but only abnormal results are displayed) Labs Reviewed  COMPREHENSIVE METABOLIC PANEL - Abnormal; Notable for the following components:      Result Value   Glucose, Bld 101 (*)    All other components within normal limits  URINALYSIS, ROUTINE W REFLEX MICROSCOPIC - Abnormal; Notable for the following components:   APPearance CLOUDY (*)    All other components within normal limits  RAPID URINE DRUG SCREEN, HOSP PERFORMED - Abnormal; Notable for the following components:   Tetrahydrocannabinol POSITIVE (*)    All other components within normal limits  I-STAT BETA HCG BLOOD, ED ([facility], [facility], [facility] ONLY) - Abnormal; Notable for the following components:   I-stat hCG, quantitative 6.9 (*)    All other components within normal limits  RESP PANEL BY RT-PCR (FLU A&B, COVID) ARPGX2  CULTURE, BLOOD (ROUTINE X 2)  CULTURE, BLOOD (ROUTINE X 2)  URINE CULTURE  RESP PANEL BY RT-PCR (FLU A&B, COVID) ARPGX2  LACTIC ACID, PLASMA  CBC WITH DIFFERENTIAL/PLATELET  PROTIME-INR  APTT  HCG, QUANTITATIVE, PREGNANCY    EKG EKG Interpretation  Date/Time:  Wednesday May 02 2021 21:12:56 EST Ventricular Rate:  102 PR Interval:  154 QRS Duration: 86 QT Interval:  326 QTC Calculation: 424 R Axis:   50 Text  Interpretation: Sinus tachycardia Nonspecific T wave abnormality Abnormal ECG Confirmed by Sherwood Gambler [phone]) on 05/03/2021 7:40:29 AM  Radiology DG Chest 1 View  Result Date: 05/02/2021 CLINICAL DATA:  Back pain. EXAM: CHEST  1 VIEW COMPARISON:  Chest x-ray with ribs 01/22/2019 FINDINGS: The heart size and mediastinal contours are within normal limits. Both lungs are clear. The visualized skeletal structures are unremarkable. IMPRESSION: No active disease. Electronically Signed   By: Ronney Asters M.D.   On: 05/02/2021 22:22     Procedures Procedures   Medications Ordered in ED Medications  HYDROmorphone (DILAUDID) injection 1 mg (1 mg Intravenous Given 05/03/21 0810)  metoCLOPramide (REGLAN) injection 10 mg (10 mg Intravenous Given 05/03/21 0810)  alum & mag hydroxide-simeth (MAALOX/MYLANTA) 200-200-20 MG/5ML suspension 30 mL (30 mLs Oral Given 05/03/21 0854)  HYDROmorphone (DILAUDID) injection 1 mg (1 mg Intravenous Given 05/03/21 1359)  lactated ringers bolus 1,000 mL (1,000 mLs Intravenous New Bag/Given 05/03/21 1238)    ED Course  I have reviewed the triage vital signs and the nursing notes.  Pertinent labs & imaging results that were available during my care of the patient were reviewed by me and considered in my medical decision making (see chart for details).    MDM Rules/Calculators/A&P                           Patient is hemodynamically stable.  She was given IV Dilaudid for pain.  She has not been febrile or ill-appearing.  Her work-up so far is benign as far as lab work is concerned but based on her history and reported symptoms she will need MRIs.  Unfortunately she is describing pain all the way up into her neck and so she will get MRI of C/T/L-spine.  She is describing a headache but states she had a headache with the symptoms last time.  Highest concern is for spinal infection such as epidural abscess.  Her presentation is not consistent with something such as Guillain-Barr.  No ascending symptoms.  MRI pending. Care to Dr. Vanita Panda.  Final Clinical Impression(s) / ED Diagnoses Final diagnoses:  Back pain    Rx / DC Orders ED Discharge Orders     None        Sherwood Gambler, MD 05/03/21 1553

## 2021-05-03 NOTE — ED Provider Notes (Addendum)
Care of the patient assumed at signout.  Events of the patient's MRI results are available.  I reviewed the images, discussed them with our neurosurgery team.  No evidence for acute new phenomena, no evidence for recurrence of abscess.  Findings are consistent with healing process.  Patient in no distress, and per earlier neuro exam as appropriate functionality.  Patient appropriate for discharge with outpatient follow-up.  6:51 PM Prior to discharge on repeat evaluation the patient is awake, alert, in no distress, speaking clearly, using her cellular telephone.  I reviewed the images, discussed them with our neurosurgery colleagues as well.  Patient appropriate for outpatient follow-up.   Carmin Muskrat, MD 05/03/21 Rubin Payor, MD 05/03/21 [phone]

## 2021-05-03 NOTE — ED Notes (Signed)
Patient transported to MRI 

## 2021-05-04 ENCOUNTER — Telehealth: Payer: Self-pay

## 2021-05-04 NOTE — Telephone Encounter (Signed)
Transition Care Management Unsuccessful Follow-up Telephone Call  Date of discharge and from where:  05/03/2021-[facility]   Attempts:  1st Attempt  Reason for unsuccessful TCM follow-up call:  Left voice message

## 2021-05-05 ENCOUNTER — Telehealth: Payer: Medicaid Other | Admitting: Nurse Practitioner

## 2021-05-05 DIAGNOSIS — N760 Acute vaginitis: Secondary | ICD-10-CM

## 2021-05-05 DIAGNOSIS — B9689 Other specified bacterial agents as the cause of diseases classified elsewhere: Secondary | ICD-10-CM

## 2021-05-05 LAB — URINE CULTURE: Culture: 10000 — AB

## 2021-05-05 MED ORDER — METRONIDAZOLE 500 MG PO TABS
500.0000 mg | ORAL_TABLET | Freq: Two times a day (BID) | ORAL | 0 refills | Status: AC
Start: 2021-05-05 — End: 2021-05-12

## 2021-05-05 NOTE — Progress Notes (Signed)
I have spent 5 minutes in review of e-visit questionnaire, review and updating patient chart, medical decision making and response to patient.  ° °Delwyn Scoggin W Flossie Wexler, NP ° °  °

## 2021-05-05 NOTE — Progress Notes (Signed)

## 2021-05-06 ENCOUNTER — Encounter: Payer: Self-pay | Admitting: Internal Medicine

## 2021-05-06 ENCOUNTER — Telehealth: Payer: Self-pay | Admitting: Emergency Medicine

## 2021-05-06 NOTE — Telephone Encounter (Signed)
Post ED Visit - Positive Culture Follow-up  Culture report reviewed by antimicrobial stewardship pharmacist: [place] [facility] []  [address] Pharm.D. []  Heide Guile, Pharm.D., BCPS AQ-ID []  Parks Neptune, Pharm.D., BCPS []  Alycia Rossetti, Pharm.D., BCPS []  [place], Pharm.D., BCPS, AAHIVP []  Legrand Como, Pharm.D., BCPS, AAHIVP []  Salome Arnt, PharmD, BCPS []  Johnnette Gourd, PharmD, BCPS []  Hughes Better, PharmD, BCPS []  Leeroy Cha, PharmD []  Laqueta Linden, PharmD, BCPS [x]  Albertina Parr, PharmD  [place] Team []  Leodis Sias, PharmD []  Lindell Spar, PharmD []  Royetta Asal, PharmD []  Graylin Shiver, Rph []  Rema Fendt) Glennon Mac, PharmD []  Arlyn Dunning, PharmD []  Netta Cedars, PharmD []  Dia Sitter, PharmD []  Leone Haven, PharmD []  Gretta Arab, PharmD []  Theodis Shove, PharmD []  Peggyann Juba, PharmD []  Reuel Boom, PharmD   Positive urine culture No further patient follow-up is required at this time. Calvert Cantor MD  [facility] Mallick 05/06/2021, 1:45 PM

## 2021-05-07 LAB — CULTURE, BLOOD (ROUTINE X 2)
Culture: NO GROWTH
Culture: NO GROWTH
Special Requests: ADEQUATE
Special Requests: ADEQUATE

## 2021-05-07 NOTE — Telephone Encounter (Signed)
Transition Care Management Unsuccessful Follow-up Telephone Call  Date of discharge and from where:  05/03/2021 from [facility]  Attempts:  2nd Attempt  Reason for unsuccessful TCM follow-up call:  Left voice message

## 2021-05-08 NOTE — Telephone Encounter (Signed)
Transition Care Management Unsuccessful Follow-up Telephone Call  Date of discharge and from where:  05/03/2021 from [facility]  Attempts:  3rd Attempt  Reason for unsuccessful TCM follow-up call:  Unable to reach patient

## 2021-05-17 ENCOUNTER — Ambulatory Visit: Payer: Medicaid Other | Admitting: Nurse Practitioner

## 2021-05-17 ENCOUNTER — Other Ambulatory Visit (HOSPITAL_COMMUNITY)
Admission: RE | Admit: 2021-05-17 | Discharge: 2021-05-17 | Disposition: A | Discharge status: Home / Self Care | Payer: Medicaid Other | Source: Ambulatory Visit | Attending: Nurse Practitioner | Admitting: Nurse Practitioner

## 2021-05-17 ENCOUNTER — Encounter: Payer: Self-pay | Admitting: Nurse Practitioner

## 2021-05-17 ENCOUNTER — Other Ambulatory Visit: Payer: Self-pay

## 2021-05-17 VITALS — BP 108/71 | HR 86 | Ht 65.0 in | Wt 237.0 lb

## 2021-05-17 DIAGNOSIS — K219 Gastro-esophageal reflux disease without esophagitis: Secondary | ICD-10-CM

## 2021-05-17 DIAGNOSIS — F411 Generalized anxiety disorder: Secondary | ICD-10-CM

## 2021-05-17 DIAGNOSIS — A6000 Herpesviral infection of urogenital system, unspecified: Secondary | ICD-10-CM

## 2021-05-17 DIAGNOSIS — F1210 Cannabis abuse, uncomplicated: Secondary | ICD-10-CM

## 2021-05-17 DIAGNOSIS — N760 Acute vaginitis: Secondary | ICD-10-CM | POA: Diagnosis present

## 2021-05-17 DIAGNOSIS — Z3200 Encounter for pregnancy test, result unknown: Secondary | ICD-10-CM | POA: Diagnosis not present

## 2021-05-17 DIAGNOSIS — K921 Melena: Secondary | ICD-10-CM

## 2021-05-17 DIAGNOSIS — Z23 Encounter for immunization: Secondary | ICD-10-CM

## 2021-05-17 DIAGNOSIS — F331 Major depressive disorder, recurrent, moderate: Secondary | ICD-10-CM

## 2021-05-17 DIAGNOSIS — F4310 Post-traumatic stress disorder, unspecified: Secondary | ICD-10-CM

## 2021-05-17 DIAGNOSIS — F902 Attention-deficit hyperactivity disorder, combined type: Secondary | ICD-10-CM

## 2021-05-17 LAB — POCT URINALYSIS DIP (CLINITEK)
Blood, UA: NEGATIVE
Glucose, UA: NEGATIVE mg/dL
Leukocytes, UA: NEGATIVE
Nitrite, UA: NEGATIVE
POC PROTEIN,UA: NEGATIVE
Spec Grav, UA: >=1.03 — AB (ref 1.010–1.025)
Urobilinogen, UA: 1 E.U./dL
pH, UA: 6 (ref 5.0–8.0)

## 2021-05-17 LAB — POCT URINE PREGNANCY: Preg Test, Ur: NEGATIVE

## 2021-05-17 MED ORDER — METRONIDAZOLE 500 MG PO TABS: 500.0000 mg | ORAL_TABLET | Freq: Two times a day (BID) | ORAL | 0 refills | Status: AC

## 2021-05-17 MED ORDER — BUPROPION HCL ER (SR) 100 MG PO TB12: 100.0000 mg | ORAL_TABLET | Freq: Two times a day (BID) | ORAL | Status: DC

## 2021-05-17 MED ORDER — PANTOPRAZOLE SODIUM 40 MG PO TBEC: 40.0000 mg | DELAYED_RELEASE_TABLET | Freq: Every day | ORAL | 3 refills | Status: AC

## 2021-05-17 MED ORDER — FLUCONAZOLE 150 MG PO TABS: 150.0000 mg | ORAL_TABLET | Freq: Once | ORAL | 0 refills | Status: AC

## 2021-05-17 NOTE — Progress Notes (Signed)
° °  Colleen Valentine     MRN: [id number]      DOB: 03-19-1996   HPI Colleen Valentine is here to establish care.previous PCP was Evelina Dun at [facility].   She is having vaginal pain, white colored, discharge, itching , pelvic pain,all started 3 days ago she has started using tampons. Her female sexual partner is new to her, they have had sex twice, her partner has other sexual partner, they did not use condom.LPM was the 12/2. She would like a pregnancy test done. She had an e visit for the symptoms on 12/10 , they diagnosed her with  BV, she did not pick up the flagyl sent to her pharmacy.   PTSD, Major depressive disorder, recurrent episode , moderate, ADHD, combined type, Anxiety PT stopped taking seroquel, Wellbutrin, lexapro, has used trazodone for insomnia meds made her feel like she was on a strong pills, was going to [facility], she does not like them and did not go back to them.  GERD Pt c/o GERD, has burning sensation in her chest, has tried omeprazole  20mg  while pregnant but it did not help, she has been avoiding spicy greasy foods, tried to stick to a bland diet. pt stated that she has hemorrhoid and has noticed blood in her stool.   Epidural abscess: She was followed by neurosurgery , she received Daptomycin for this condition.   History of STI's Genital Herpes. Takes valtrex daily.   Polysubstance abuse : uses marijuana, and vapes, quit smoking cigarettes in 2021.     ROS Denies recent fever or chills. Denies sinus pressure, nasal congestion, ear pain or sore throat. Denies chest congestion, productive cough or wheezing. Denies chest pains, palpitations and leg swelling Denies abdominal pain, nausea, vomiting,diarrhea or constipation, sometimes has bloody stool, acid reflux Has dysuria,vaginal itching, pelvic pain,  no frequency, hesitancy or incontinence. Denies joint pain, swelling and limitation in mobility. Denies headaches, seizures, numbness, or tingling. Has depression,  anxiety  insomnia. Denies skin break down or rash.    PE  BP 108/71 (BP Location: Right Arm, Patient Position: Sitting, Cuff Size: Large)    Pulse 86    Ht 5\' 5"  (1.651 m)    Wt 237 lb (107.5 kg)    LMP 04/26/2021 (Approximate)    SpO2 95%    BMI 39.44 kg/m  Glee Arvin Ma present as chaperone.  Patient alert and oriented and in no cardiopulmonary distress.  HEENT: No facial asymmetry, EOMI,     Neck supple .  Chest: Clear to auscultation bilaterally.  CVS: S1, S2 no murmurs, no S3.Regular rate.  ABD: Soft non tender.   Ext: No edema  MS: Adequate ROM spine, shoulders, hips and knees.  Skin: Intact, no ulcerations or rash noted.  Psych: Good eye contact, normal affect. Memory intact, she is anxious  not depressed appearing denies SI, HI  CNS: CN 2-12 intact, power,  normal throughout.no focal deficits noted.  Genitourinary: white milky vaginal  dc, cervico motion tenderness, cervix appear red, no bleeding, no ulcers, no masses external vaginal ares appears normal.    Assessment & Plan

## 2021-05-17 NOTE — Patient Instructions (Addendum)
Please get your COVID vaccine and pneumonia vaccine at your pharmacy.  We will get back to you about the tests done today.   Take bupropion 100 mg two times daily for your depression , you have been referred to psychiatrist.  Take Protonix 40mg  daily for your acid reflux. Please bring back the hemoccult card given to your today with three different stool sample.  It is important that you exercise regularly at least 30 minutes 5 times a week.  Think about what you will eat, plan ahead. Choose " clean, green, fresh or frozen" over canned, processed or packaged foods which are more sugary, salty and fatty. 70 to 75% of food eaten should be vegetables and fruit. Three meals at set times with snacks allowed between meals, but they must be fruit or vegetables. Aim to eat over a 12 hour period , example 7 am to 7 pm, and STOP after  your last meal of the day. Drink water,generally about 64 ounces per day, no other drink is as healthy. Fruit juice is best enjoyed in a healthy way, by EATING the fruit.  Thanks for choosing [facility], we consider it a privelige to serve you.

## 2021-05-18 ENCOUNTER — Encounter: Payer: Self-pay | Admitting: Nurse Practitioner

## 2021-05-18 DIAGNOSIS — Z23 Encounter for immunization: Secondary | ICD-10-CM | POA: Insufficient documentation

## 2021-05-18 DIAGNOSIS — K921 Melena: Secondary | ICD-10-CM | POA: Insufficient documentation

## 2021-05-18 DIAGNOSIS — N760 Acute vaginitis: Secondary | ICD-10-CM | POA: Insufficient documentation

## 2021-05-18 DIAGNOSIS — Z3200 Encounter for pregnancy test, result unknown: Secondary | ICD-10-CM | POA: Insufficient documentation

## 2021-05-18 NOTE — Assessment & Plan Note (Addendum)
Cervico vaginal swab today  for STI  Take metrodanizole 500mg  BID, never used med that was ordered during her virtual visit on 12/10 Diflucan given for yeast infection, stated that she always have yeast infection with antibiotics.  UA was normal. Pt educated on the need to use condom while having sex to reduce  the risk of getting STI, she verbalized understanding.

## 2021-05-18 NOTE — Assessment & Plan Note (Signed)
hemoccult card given to pt today wit instructions to bring card back with three different stool samples.

## 2021-05-18 NOTE — Assessment & Plan Note (Addendum)
PHQ 23, GAD 21. Denies suicidal ideation or thoughts of harming anyone. Start bupropion 100mg  BID. Refer to psych

## 2021-05-18 NOTE — Assessment & Plan Note (Signed)
continue valtrex

## 2021-05-18 NOTE — Assessment & Plan Note (Signed)
Start protonix 40mg  daily, avoid offending foods.

## 2021-05-18 NOTE — Assessment & Plan Note (Signed)
Negative pregnancy test today.

## 2021-05-18 NOTE — Assessment & Plan Note (Signed)
Pt counseled on the need to stop using Marijuana she verbalized understanding.

## 2021-05-18 NOTE — Assessment & Plan Note (Signed)
Start bupropion 100mg  BID. Refer to psych

## 2021-05-18 NOTE — Assessment & Plan Note (Signed)
refer to psych. start bupropion 100mg  BID

## 2021-05-22 ENCOUNTER — Ambulatory Visit (INDEPENDENT_AMBULATORY_CARE_PROVIDER_SITE_OTHER): Payer: Medicaid Other | Admitting: Licensed Clinical Social Worker

## 2021-05-22 ENCOUNTER — Other Ambulatory Visit: Payer: Self-pay

## 2021-05-22 ENCOUNTER — Telehealth: Payer: Self-pay

## 2021-05-22 DIAGNOSIS — F331 Major depressive disorder, recurrent, moderate: Secondary | ICD-10-CM

## 2021-05-22 LAB — CERVICOVAGINAL ANCILLARY ONLY
Bacterial Vaginitis (gardnerella): NEGATIVE
Candida Glabrata: NEGATIVE
Candida Vaginitis: NEGATIVE
Chlamydia: NEGATIVE
Comment: NEGATIVE
Comment: NEGATIVE
Comment: NEGATIVE
Comment: NEGATIVE
Comment: NEGATIVE
Comment: NORMAL
Neisseria Gonorrhea: NEGATIVE
Trichomonas: NEGATIVE

## 2021-05-22 LAB — URINE CULTURE: Organism ID, Bacteria: NO GROWTH

## 2021-05-22 MED ORDER — BUPROPION HCL ER (SR) 100 MG PO TB12: 100.0000 mg | ORAL_TABLET | Freq: Two times a day (BID) | ORAL | Status: DC

## 2021-05-22 NOTE — Telephone Encounter (Signed)
Medication sent - patient aware 

## 2021-05-22 NOTE — Telephone Encounter (Signed)
Katie please call pt regarding her rx called on it looks like 05/17/21. [facility] - she says she [facility] not get [facility] and [facility]  [phone]

## 2021-05-23 ENCOUNTER — Other Ambulatory Visit: Payer: Self-pay

## 2021-05-23 ENCOUNTER — Encounter: Payer: Self-pay | Admitting: Nurse Practitioner

## 2021-05-23 DIAGNOSIS — F331 Major depressive disorder, recurrent, moderate: Secondary | ICD-10-CM

## 2021-05-23 MED ORDER — BUPROPION HCL ER (SR) 100 MG PO TB12: 100.0000 mg | ORAL_TABLET | Freq: Two times a day (BID) | ORAL | Status: DC

## 2021-05-25 ENCOUNTER — Other Ambulatory Visit: Payer: Self-pay | Admitting: Nurse Practitioner

## 2021-05-25 ENCOUNTER — Telehealth: Payer: Self-pay | Admitting: Nurse Practitioner

## 2021-05-25 DIAGNOSIS — F331 Major depressive disorder, recurrent, moderate: Secondary | ICD-10-CM

## 2021-05-25 MED ORDER — BUPROPION HCL ER (SR) 100 MG PO TB12
100.0000 mg | ORAL_TABLET | Freq: Two times a day (BID) | ORAL | 3 refills | Status: DC
Start: 2021-05-25 — End: 2021-09-06

## 2021-05-25 NOTE — Telephone Encounter (Signed)
Rx resent.

## 2021-05-25 NOTE — Telephone Encounter (Signed)
Please call and give a verbal for the Welbutrin --they still do not have it

## 2021-05-25 NOTE — Telephone Encounter (Signed)
I tried it again... it was set to print and had 180 refills when I reordered it, so that may have been why they kicked it back

## 2021-05-29 NOTE — Progress Notes (Signed)
During the initial 5 minutes, Patient abruptly stated that she wanted to "order some food and call I will call you right back."  Writer did not receive call back

## 2021-06-05 ENCOUNTER — Telehealth: Payer: Self-pay

## 2021-06-05 NOTE — Telephone Encounter (Signed)
Patient called left a voice mail has a question on prescription meds currently taking, prefers a clinic nurse to return her call.  [phone].

## 2021-06-05 NOTE — Telephone Encounter (Signed)
LM for patient to return call.

## 2021-06-07 ENCOUNTER — Other Ambulatory Visit: Payer: Self-pay | Admitting: Nurse Practitioner

## 2021-06-07 DIAGNOSIS — F331 Major depressive disorder, recurrent, moderate: Secondary | ICD-10-CM

## 2021-06-07 DIAGNOSIS — F411 Generalized anxiety disorder: Secondary | ICD-10-CM

## 2021-06-07 NOTE — Telephone Encounter (Signed)
Called and notified patient of providers recommendations, patient verbalized understanding.

## 2021-06-07 NOTE — Telephone Encounter (Signed)
Called and spoke with patient, she is having adverse side effects on the Wellbutrin. Would like to come off medication and start on something else. Patient states her anxiety has worsened since being on the medicine. She is very irritable. At one point and time was having suicidal thoughts. Discussed with patient today and she advised me she is no longer having suicidal thoughts but would like to get this medication changed. Please advise.

## 2021-06-12 ENCOUNTER — Telehealth: Payer: Medicaid Other | Admitting: Physician Assistant

## 2021-06-12 DIAGNOSIS — N760 Acute vaginitis: Secondary | ICD-10-CM

## 2021-06-12 DIAGNOSIS — B3731 Acute candidiasis of vulva and vagina: Secondary | ICD-10-CM

## 2021-06-12 DIAGNOSIS — B9689 Other specified bacterial agents as the cause of diseases classified elsewhere: Secondary | ICD-10-CM

## 2021-06-12 MED ORDER — METRONIDAZOLE 500 MG PO TABS
500.0000 mg | ORAL_TABLET | Freq: Two times a day (BID) | ORAL | 0 refills | Status: AC
Start: 2021-06-12 — End: 2021-06-19

## 2021-06-12 MED ORDER — FLUCONAZOLE 150 MG PO TABS: 150.0000 mg | ORAL_TABLET | Freq: Once | ORAL | 0 refills | Status: AC

## 2021-06-12 NOTE — Progress Notes (Signed)
E-Visit for Vaginal Symptoms  We are sorry that you are not feeling well. Here is how we plan to help! Based on what you shared with me it looks like you: May have a vaginosis due to bacteria and yeast vaginosis.  Vaginosis is an inflammation of the vagina that can result in discharge, itching and pain. The cause is usually a change in the normal balance of vaginal bacteria or an infection. Vaginosis can also result from reduced estrogen levels after menopause.  The most common causes of vaginosis are:   Bacterial vaginosis which results from an overgrowth of one on several organisms that are normally present in your vagina.   Yeast infections which are caused by a naturally occurring fungus called candida.   Vaginal atrophy (atrophic vaginosis) which results from the thinning of the vagina from reduced estrogen levels after menopause.   Trichomoniasis which is caused by a parasite and is commonly transmitted by sexual intercourse.  Factors that increase your risk of developing vaginosis include: Medications, such as antibiotics and steroids Uncontrolled diabetes Use of hygiene products such as bubble bath, vaginal spray or vaginal deodorant Douching Wearing damp or tight-fitting clothing Using an intrauterine device (IUD) for birth control Hormonal changes, such as those associated with pregnancy, birth control pills or menopause Sexual activity Having a sexually transmitted infection  Your treatment plan is Metronidazole or Flagyl 500mg  twice a day for 7 days.  I have electronically sent this prescription into the pharmacy that you have chosen. I have also sent in Diflucan for yeast as well.   Be sure to take all of the medication as directed. Stop taking any medication if you develop a rash, tongue swelling or shortness of breath. Mothers who are breast feeding should consider pumping and discarding their breast milk while on these antibiotics. However, there is no consensus that  infant exposure at these doses would be harmful.  Remember that medication creams can weaken latex condoms. Marland Kitchen   HOME CARE:  Good hygiene may prevent some types of vaginosis from recurring and may relieve some symptoms:  Avoid baths, hot tubs and whirlpool spas. Rinse soap from your outer genital area after a shower, and dry the area well to prevent irritation. Don't use scented or harsh soaps, such as those with deodorant or antibacterial action. Avoid irritants. These include scented tampons and pads. Wipe from front to back after using the toilet. Doing so avoids spreading fecal bacteria to your vagina.  Other things that may help prevent vaginosis include:  Don't douche. Your vagina doesn't require cleansing other than normal bathing. Repetitive douching disrupts the normal organisms that reside in the vagina and can actually increase your risk of vaginal infection. Douching won't clear up a vaginal infection. Use a latex condom. Both female and female latex condoms may help you avoid infections spread by sexual contact. Wear cotton underwear. Also wear pantyhose with a cotton crotch. If you feel comfortable without it, skip wearing underwear to bed. Yeast thrives in [employer] Your symptoms should improve in the next day or two.  GET HELP RIGHT AWAY IF:  You have pain in your lower abdomen ( pelvic area or over your ovaries) You develop nausea or vomiting You develop a fever Your discharge changes or worsens You have persistent pain with intercourse You develop shortness of breath, a rapid pulse, or you faint.  These symptoms could be signs of problems or infections that need to be evaluated by a medical provider now.  MAKE SURE  YOU   Understand these instructions. Will watch your condition. Will get help right away if you are not doing well or get worse.  Thank you for choosing an e-visit.  Your e-visit answers were reviewed by a board certified advanced clinical  practitioner to complete your personal care plan. Depending upon the condition, your plan could have included both over the counter or prescription medications.  Please review your pharmacy choice. Make sure the pharmacy is open so you can pick up prescription now. If there is a problem, you may contact your provider through [employer] and have the prescription routed to another pharmacy.  Your safety is important to [place]. If you have drug allergies check your prescription carefully.   For the next 24 hours you can use MyChart to ask questions about today's visit, request a non-urgent call back, or ask for a work or school excuse. You will get an email in the next two days asking about your experience. I hope that your e-visit has been valuable and will speed your recovery.  I provided 5 minutes of non face-to-face time during this encounter for chart review and documentation.

## 2021-06-27 ENCOUNTER — Telehealth: Payer: Self-pay | Admitting: Nurse Practitioner

## 2021-06-27 NOTE — Telephone Encounter (Signed)
Pt called in regard to buPROPion ER (WELLBUTRIN SR) 100 MG 12 hr tablet  Pt states the med is not working for her.  Pt does not want to continue to take the medication.  Pt would like to try a different med.

## 2021-06-28 ENCOUNTER — Other Ambulatory Visit: Payer: Self-pay | Admitting: Nurse Practitioner

## 2021-06-28 ENCOUNTER — Encounter: Payer: Medicaid Other | Admitting: Nurse Practitioner

## 2021-06-28 DIAGNOSIS — F331 Major depressive disorder, recurrent, moderate: Secondary | ICD-10-CM

## 2021-06-28 MED ORDER — SERTRALINE HCL 50 MG PO TABS: 50.0000 mg | ORAL_TABLET | Freq: Every day | ORAL | 3 refills | Status: DC

## 2021-06-29 ENCOUNTER — Telehealth: Payer: Self-pay | Admitting: Nurse Practitioner

## 2021-06-29 NOTE — Telephone Encounter (Signed)
Spoke with pt and she states verbal understanding

## 2021-06-29 NOTE — Telephone Encounter (Signed)
Spoke with pt

## 2021-06-29 NOTE — Telephone Encounter (Signed)
Pt is returning your call

## 2021-06-29 NOTE — Telephone Encounter (Signed)
Called pt no answer left message to call office back

## 2021-06-30 ENCOUNTER — Telehealth: Payer: Medicaid Other | Admitting: Nurse Practitioner

## 2021-06-30 DIAGNOSIS — J0110 Acute frontal sinusitis, unspecified: Secondary | ICD-10-CM | POA: Diagnosis not present

## 2021-06-30 MED ORDER — AMOXICILLIN-POT CLAVULANATE 875-125 MG PO TABS: 1.0000 | ORAL_TABLET | Freq: Two times a day (BID) | ORAL | 0 refills | Status: DC

## 2021-06-30 NOTE — Progress Notes (Signed)
E-Visit for Sinus Problems ? ?We are sorry that you are not feeling well.  Here is how we plan to help! ? ?Based on what you have shared with me it looks like you have sinusitis.  Sinusitis is inflammation and infection in the sinus cavities of the head.  Based on your presentation I believe you most likely have Acute Bacterial Sinusitis.  This is an infection caused by bacteria and is treated with antibiotics. I have prescribed Augmentin 875mg/125mg one tablet twice daily with food, for 7 days. You may use an oral decongestant such as Mucinex D or if you have glaucoma or high blood pressure use plain Mucinex. Saline nasal spray help and can safely be used as often as needed for congestion.  If you develop worsening sinus pain, fever or notice severe headache and vision changes, or if symptoms are not better after completion of antibiotic, please schedule an appointment with a health care provider.   ? ?Sinus infections are not as easily transmitted as other respiratory infection, however we still recommend that you avoid close contact with loved ones, especially the very young and elderly.  Remember to wash your hands thoroughly throughout the day as this is the number one way to prevent the spread of infection! ? ?Home Care: ?Only take medications as instructed by your medical team. ?Complete the entire course of an antibiotic. ?Do not take these medications with alcohol. ?A steam or ultrasonic humidifier can help congestion.  You can place a towel over your head and breathe in the steam from hot water coming from a faucet. ?Avoid close contacts especially the very young and the elderly. ?Cover your mouth when you cough or sneeze. ?Always remember to wash your hands. ? ?Get Help Right Away If: ?You develop worsening fever or sinus pain. ?You develop a severe head ache or visual changes. ?Your symptoms persist after you have completed your treatment plan. ? ?Make sure you ?Understand these instructions. ?Will watch  your condition. ?Will get help right away if you are not doing well or get worse. ? ?Thank you for choosing an e-visit. ? ?Your e-visit answers were reviewed by a board certified advanced clinical practitioner to complete your personal care plan. Depending upon the condition, your plan could have included both over the counter or prescription medications. ? ?Please review your pharmacy choice. Make sure the pharmacy is open so you can pick up prescription now. If there is a problem, you may contact your provider through MyChart messaging and have the prescription routed to another pharmacy.  Your safety is important to us. If you have drug allergies check your prescription carefully.  ? ?For the next 24 hours you can use MyChart to ask questions about today's visit, request a non-urgent call back, or ask for a work or school excuse. ?You will get an email in the next two days asking about your experience. I hope that your e-visit has been valuable and will speed your recovery. ? ?5-10 minutes spent reviewing and documenting in chart. ? ?

## 2021-07-31 ENCOUNTER — Encounter: Payer: Self-pay | Admitting: Nurse Practitioner

## 2021-09-06 ENCOUNTER — Ambulatory Visit (INDEPENDENT_AMBULATORY_CARE_PROVIDER_SITE_OTHER): Payer: Medicaid Other | Admitting: Obstetrics & Gynecology

## 2021-09-06 ENCOUNTER — Other Ambulatory Visit (HOSPITAL_COMMUNITY)
Admission: RE | Admit: 2021-09-06 | Discharge: 2021-09-06 | Disposition: A | Discharge status: Home / Self Care | Payer: Medicaid Other | Source: Ambulatory Visit | Attending: Obstetrics & Gynecology | Admitting: Obstetrics & Gynecology

## 2021-09-06 ENCOUNTER — Encounter: Payer: Self-pay | Admitting: Obstetrics & Gynecology

## 2021-09-06 VITALS — BP 108/75 | HR 66 | Ht 65.0 in | Wt 253.8 lb

## 2021-09-06 DIAGNOSIS — Z01419 Encounter for gynecological examination (general) (routine) without abnormal findings: Secondary | ICD-10-CM

## 2021-09-06 DIAGNOSIS — R102 Pelvic and perineal pain unspecified side: Secondary | ICD-10-CM

## 2021-09-06 DIAGNOSIS — F32A Depression, unspecified: Secondary | ICD-10-CM

## 2021-09-06 DIAGNOSIS — F419 Anxiety disorder, unspecified: Secondary | ICD-10-CM

## 2021-09-06 DIAGNOSIS — Z0000 Encounter for general adult medical examination without abnormal findings: Secondary | ICD-10-CM | POA: Diagnosis not present

## 2021-09-06 MED ORDER — SERTRALINE HCL 100 MG PO TABS: 100.0000 mg | ORAL_TABLET | Freq: Every day | ORAL | 4 refills | Status: DC

## 2021-09-06 NOTE — Progress Notes (Signed)
? ?WELL-WOMAN EXAMINATION ?Patient name: Colleen Valentine MRN [id number]  Date of birth: 08-02-1995 ?Chief Complaint:   ?Gynecologic Exam ? ?History of Present Illness:   ?Colleen Valentine is a 26 y.o. [id number] female being seen today for a routine well-woman exam.  ?Today she notes the following concerns: ? ?Pelvic pain: This has been going on x last 49mo.  Pain lower abd/pelvic on both sides, occurs spontaneously.  Not related to certain activity.  At times will wake her up at night.  No improvement with OTC medication.  Described as sharp 8/10 pain.  No associated symptoms- no urinary or GI concerns.  Female partner- no discomfort during intimacy.  No other acute complaints ? ?Notes vomiting daily- seen by PCP GERD- related ? ?Anxiety: Lamictal wasn't working.  Self-increased zoloft to '100mg'$  daily and that has been working well. ? ?Patient's last menstrual period was 08/21/2021 (approximate). ?Denies issues with her menses ?The current method of family planning is  n/a .  ? ? ?Last pap never performed.  ?Last mammogram: n/a. ?Last colonoscopy: n/a ? ? ?  09/06/2021  ?  1:36 PM 05/17/2021  ?  3:20 PM 10/05/2020  ? 12:15 PM 07/09/2019  ?  4:13 PM 05/07/2019  ?  9:42 AM  ?Depression screen PHQ 2/9  ?Decreased Interest '2 3 2 3 '$ 0  ?Down, Depressed, Hopeless '2 3 2 3 '$ 0  ?PHQ - 2 Score '4 6 4 6 '$ 0  ?Altered sleeping '3 3 2 2   '$ ?Tired, decreased energy '3 3 2 3   '$ ?Change in appetite '3 3 2 3   '$ ?Feeling bad or failure about yourself  '1 3 2 3   '$ ?Trouble concentrating '3 3 2 3   '$ ?Moving slowly or fidgety/restless '3 1 1 3   '$ ?Suicidal thoughts 0 1 1 0   ?PHQ-9 Score '20 23 16 23   '$ ?Difficult doing work/chores  Extremely dIfficult Extremely dIfficult    ? ? ? ? ?Review of Systems:   ?Pertinent items are noted in HPI ?Denies any headaches, blurred vision, fatigue, shortness of breath, chest pain, bowel movements, urination, or intercourse unless otherwise stated above. ? ?Pertinent History Reviewed:  ?Reviewed past medical,surgical, social and  family history.  ?Reviewed problem list, medications and allergies. ?Physical Assessment:  ? ?Vitals:  ? 09/06/21 1341  ?BP: 108/75  ?Pulse: 66  ?Weight: 253 lb 12.8 oz (115.1 kg)  ?Height: '5\' 5"'$  (1.651 m)  ?Body mass index is 42.23 kg/m?. ?  ?     Physical Examination:  ? General appearance - well appearing, and in no distress ? Mental status - alert, oriented to person, place, and time ? Psych:  She has a normal mood and affect ? Skin - warm and dry, normal color, no suspicious lesions noted ? Chest - effort normal, all lung fields clear to auscultation bilaterally ? Heart - normal rate and regular rhythm ? Neck:  midline trachea, no thyromegaly or nodules ? Breasts - breasts appear normal, no suspicious masses, no skin or nipple changes or  axillary nodes ? Abdomen - obese, soft, bilateral lower pelvic pain noted, nondistended, no masses or organomegaly ? Pelvic - VULVA: normal appearing vulva with no masses, tenderness or lesions  VAGINA: normal appearing vagina with normal color and discharge, no lesions  CERVIX: normal appearing cervix without discharge or lesions, no CMT ? Thin prep pap is done with HR HPV cotesting ? UTERUS: uterus is felt to be normal size, shape, consistency and nontender  ? ADNEXA: No  adnexal masses or tenderness noted. ? Extremities:  No swelling or varicosities noted, no calf tenderness bilaterally ? ?Chaperone: Marcelino Scot   ? ? ?Assessment & Plan:  ?1) Well-Woman Exam ?-pap collected, reviewed ASCCP guidelines ? ?2) Pelvic pain ?-plan for pelvic US next available to r/o underlying etiology ?-next step pending results ? ?3) Anxiety ?-doing well with zoloft plan to continue ? ? ?Meds:  ?Meds ordered this encounter  ?Medications  ? sertraline (ZOLOFT) 100 MG tablet  ?  Sig: Take 1 tablet (100 mg total) by mouth daily.  ?  Dispense:  90 tablet  ?  Refill:  4  ? ? ?Follow-up: Return for Pelvic US- if more than 3-4wks for Amber, ok to schedule at [facility]. ? ? ?Janyth Pupa, DO ?Attending  [facility] [place] ? ? ?

## 2021-09-10 LAB — CYTOLOGY - PAP
Chlamydia: NEGATIVE
Comment: NEGATIVE
Comment: NEGATIVE
Comment: NORMAL
Diagnosis: NEGATIVE
High risk HPV: NEGATIVE
Neisseria Gonorrhea: NEGATIVE

## 2021-09-13 ENCOUNTER — Telehealth: Payer: Medicaid Other | Admitting: Physician Assistant

## 2021-09-13 DIAGNOSIS — R6889 Other general symptoms and signs: Secondary | ICD-10-CM

## 2021-09-13 DIAGNOSIS — Z20828 Contact with and (suspected) exposure to other viral communicable diseases: Secondary | ICD-10-CM | POA: Diagnosis not present

## 2021-09-13 MED ORDER — OSELTAMIVIR PHOSPHATE 75 MG PO CAPS: 75.0000 mg | ORAL_CAPSULE | Freq: Two times a day (BID) | ORAL | 0 refills | Status: DC

## 2021-09-13 NOTE — Progress Notes (Signed)

## 2021-09-13 NOTE — Progress Notes (Signed)
I have spent 5 minutes in review of e-visit questionnaire, review and updating patient chart, medical decision making and response to patient.   Littie Chiem Cody Kinleigh Nault, PA-C    

## 2021-09-24 ENCOUNTER — Other Ambulatory Visit: Payer: Self-pay | Admitting: Obstetrics & Gynecology

## 2021-09-24 DIAGNOSIS — R102 Pelvic and perineal pain: Secondary | ICD-10-CM

## 2021-09-25 ENCOUNTER — Other Ambulatory Visit: Payer: Medicaid Other

## 2021-10-29 ENCOUNTER — Telehealth: Payer: Medicaid Other | Admitting: Physician Assistant

## 2021-10-29 DIAGNOSIS — J069 Acute upper respiratory infection, unspecified: Secondary | ICD-10-CM

## 2021-10-29 MED ORDER — ALBUTEROL SULFATE HFA 108 (90 BASE) MCG/ACT IN AERS: 2.0000 | INHALATION_SPRAY | Freq: Four times a day (QID) | RESPIRATORY_TRACT | 0 refills | Status: DC | PRN

## 2021-10-29 MED ORDER — FLUTICASONE PROPIONATE 50 MCG/ACT NA SUSP: 2.0000 | Freq: Every day | NASAL | 0 refills | Status: DC

## 2021-10-29 MED ORDER — BENZONATATE 100 MG PO CAPS: 100.0000 mg | ORAL_CAPSULE | Freq: Three times a day (TID) | ORAL | 0 refills | Status: AC

## 2021-10-29 NOTE — Progress Notes (Signed)
E-Visit for Upper Respiratory Infection   We are sorry you are not feeling well.  Here is how we plan to help!  Based on what you have shared with me, it looks like you may have a viral upper respiratory infection.  Upper respiratory infections are caused by a large number of viruses; however, rhinovirus is the most common cause.   Symptoms vary from person to person, with common symptoms including sore throat, cough, fatigue or lack of energy and feeling of general discomfort.  A low-grade fever of up to 100.4 may present, but is often uncommon.  Symptoms vary however, and are closely related to a person's age or underlying illnesses.  The most common symptoms associated with an upper respiratory infection are nasal discharge or congestion, cough, sneezing, headache and pressure in the ears and face.  These symptoms usually persist for about 3 to 10 days, but can last up to 2 weeks.  It is important to know that upper respiratory infections do not cause serious illness or complications in most cases.    Upper respiratory infections can be transmitted from person to person, with the most common method of transmission being a person's hands.  The virus is able to live on the skin and can infect other persons for up to 2 hours after direct contact.  Also, these can be transmitted when someone coughs or sneezes; thus, it is important to cover the mouth to reduce this risk.  To keep the spread of the illness at [place], good hand hygiene is very important.  This is an infection that is most likely caused by a virus. There are no specific treatments other than to help you with the symptoms until the infection runs its course.  We are sorry you are not feeling well.  Here is how we plan to help!   For nasal congestion, you may use an oral decongestants such as Mucinex D or if you have glaucoma or high blood pressure use plain Mucinex.  Saline nasal spray or nasal drops can help and can safely be used as often as  needed for congestion.  For your congestion, I have prescribed Fluticasone nasal spray one spray in each nostril twice a day  If you do not have a history of heart disease, hypertension, diabetes or thyroid disease, prostate/bladder issues or glaucoma, you may also use Sudafed to treat nasal congestion.  It is highly recommended that you consult with a pharmacist or your primary care physician to ensure this medication is safe for you to take.     If you have a cough, you may use cough suppressants such as Delsym and Robitussin.  If you have glaucoma or high blood pressure, you can also use Coricidin HBP.   For cough I have prescribed for you A prescription cough medication called Tessalon Perles 100 mg. You may take 1-2 capsules every 8 hours as needed for cough. I have also prescribed an albuterol inhaler for your cough.  If you have a sore or scratchy throat, use a saltwater gargle-  to  teaspoon of salt dissolved in a 4-ounce to 8-ounce glass of warm water.  Gargle the solution for approximately 15-30 seconds and then spit.  It is important not to swallow the solution.  You can also use throat lozenges/cough drops and Chloraseptic spray to help with throat pain or discomfort.  Warm or cold liquids can also be helpful in relieving throat pain.  For headache, pain or general discomfort, you can use  Ibuprofen or Tylenol as directed.   Some authorities believe that zinc sprays or the use of Echinacea may shorten the course of your symptoms.   HOME CARE Only take medications as instructed by your medical team. Be sure to drink plenty of fluids. Water is fine as well as fruit juices, sodas and electrolyte beverages. You may want to stay away from caffeine or alcohol. If you are nauseated, try taking small sips of liquids. How do you know if you are getting enough fluid? Your urine should be a pale yellow or almost colorless. Get rest. Taking a steamy shower or using a humidifier may help nasal  congestion and ease sore throat pain. You can place a towel over your head and breathe in the steam from hot water coming from a faucet. Using a saline nasal spray works much the same way. Cough drops, hard candies and sore throat lozenges may ease your cough. Avoid close contacts especially the very young and the elderly Cover your mouth if you cough or sneeze Always remember to wash your hands.   GET HELP RIGHT AWAY IF: You develop worsening fever. If your symptoms do not improve within 10 days You develop yellow or green discharge from your nose over 3 days. You have coughing fits You develop a severe head ache or visual changes. You develop shortness of breath, difficulty breathing or start having chest pain Your symptoms persist after you have completed your treatment plan  MAKE SURE YOU  Understand these instructions. Will watch your condition. Will get help right away if you are not doing well or get worse.  Thank you for choosing an e-visit.  Your e-visit answers were reviewed by a board certified advanced clinical practitioner to complete your personal care plan. Depending upon the condition, your plan could have included both over the counter or prescription medications.  Please review your pharmacy choice. Make sure the pharmacy is open so you can pick up prescription now. If there is a problem, you may contact your provider through [employer] and have the prescription routed to another pharmacy.  Your safety is important to [place]. If you have drug allergies check your prescription carefully.   For the next 24 hours you can use MyChart to ask questions about today's visit, request a non-urgent call back, or ask for a work or school excuse. You will get an email in the next two days asking about your experience. I hope that your e-visit has been valuable and will speed your recovery.  Approximately 5 minutes was spent documenting and reviewing patient's chart.

## 2021-11-23 ENCOUNTER — Telehealth: Payer: Medicaid Other | Admitting: Nurse Practitioner

## 2021-11-23 DIAGNOSIS — B379 Candidiasis, unspecified: Secondary | ICD-10-CM

## 2021-11-24 MED ORDER — FLUCONAZOLE 150 MG PO TABS: 150.0000 mg | ORAL_TABLET | Freq: Once | ORAL | 0 refills | Status: AC

## 2021-11-24 NOTE — Progress Notes (Signed)
E-Visit for Vaginal Symptoms  We are sorry that you are not feeling well. Here is how we plan to help! Based on what you shared with me it looks like you: May have a yeast vaginosis  Vaginosis is an inflammation of the vagina that can result in discharge, itching and pain. The cause is usually a change in the normal balance of vaginal bacteria or an infection. Vaginosis can also result from reduced estrogen levels after menopause.  The most common causes of vaginosis are:   Bacterial vaginosis which results from an overgrowth of one on several organisms that are normally present in your vagina.   Yeast infections which are caused by a naturally occurring fungus called candida.   Vaginal atrophy (atrophic vaginosis) which results from the thinning of the vagina from reduced estrogen levels after menopause.   Trichomoniasis which is caused by a parasite and is commonly transmitted by sexual intercourse.  Factors that increase your risk of developing vaginosis include: Medications, such as antibiotics and steroids Uncontrolled diabetes Use of hygiene products such as bubble bath, vaginal spray or vaginal deodorant Douching Wearing damp or tight-fitting clothing Using an intrauterine device (IUD) for birth control Hormonal changes, such as those associated with pregnancy, birth control pills or menopause Sexual activity Having a sexually transmitted infection  Your treatment plan is A single Diflucan (fluconazole) 150mg tablet once.  I have electronically sent this prescription into the pharmacy that you have chosen.  Be sure to take all of the medication as directed. Stop taking any medication if you develop a rash, tongue swelling or shortness of breath. Mothers who are breast feeding should consider pumping and discarding their breast milk while on these antibiotics. However, there is no consensus that infant exposure at these doses would be harmful.  Remember that medication creams can  weaken latex condoms. .   HOME CARE:  Good hygiene may prevent some types of vaginosis from recurring and may relieve some symptoms:  Avoid baths, hot tubs and whirlpool spas. Rinse soap from your outer genital area after a shower, and dry the area well to prevent irritation. Don't use scented or harsh soaps, such as those with deodorant or antibacterial action. Avoid irritants. These include scented tampons and pads. Wipe from front to back after using the toilet. Doing so avoids spreading fecal bacteria to your vagina.  Other things that may help prevent vaginosis include:  Don't douche. Your vagina doesn't require cleansing other than normal bathing. Repetitive douching disrupts the normal organisms that reside in the vagina and can actually increase your risk of vaginal infection. Douching won't clear up a vaginal infection. Use a latex condom. Both female and female latex condoms may help you avoid infections spread by sexual contact. Wear cotton underwear. Also wear pantyhose with a cotton crotch. If you feel comfortable without it, skip wearing underwear to bed. Yeast thrives in moist environments Your symptoms should improve in the next day or two.  GET HELP RIGHT AWAY IF:  You have pain in your lower abdomen ( pelvic area or over your ovaries) You develop nausea or vomiting You develop a fever Your discharge changes or worsens You have persistent pain with intercourse You develop shortness of breath, a rapid pulse, or you faint.  These symptoms could be signs of problems or infections that need to be evaluated by a medical provider now.  MAKE SURE YOU   Understand these instructions. Will watch your condition. Will get help right away if you are not   doing well or get worse.  Thank you for choosing an e-visit.  Your e-visit answers were reviewed by a board certified advanced clinical practitioner to complete your personal care plan. Depending upon the condition, your plan  could have included both over the counter or prescription medications.  Please review your pharmacy choice. Make sure the pharmacy is open so you can pick up prescription now. If there is a problem, you may contact your provider through [employer] and have the prescription routed to another pharmacy.  Your safety is important to [place]. If you have drug allergies check your prescription carefully.   For the next 24 hours you can use MyChart to ask questions about today's visit, request a non-urgent call back, or ask for a work or school excuse. You will get an email in the next two days asking about your experience. I hope that your e-visit has been valuable and will speed your recovery.   I spent approximately 5 minutes reviewing the patient's history, current symptoms and coordinating their plan of care today.    Meds ordered this encounter  Medications   fluconazole (DIFLUCAN) 150 MG tablet    Sig: Take 1 tablet (150 mg total) by mouth once for 1 dose.    Dispense:  1 tablet    Refill:  0

## 2021-11-28 ENCOUNTER — Telehealth: Payer: Medicaid Other | Admitting: Physician Assistant

## 2021-11-28 DIAGNOSIS — B9689 Other specified bacterial agents as the cause of diseases classified elsewhere: Secondary | ICD-10-CM

## 2021-11-28 MED ORDER — METRONIDAZOLE 500 MG PO TABS
500.0000 mg | ORAL_TABLET | Freq: Two times a day (BID) | ORAL | 0 refills | Status: AC
Start: 2021-11-28 — End: 2021-12-05

## 2021-11-28 NOTE — Progress Notes (Signed)
E-Visit for Vaginal Symptoms  We are sorry that you are not feeling well. Here is how we plan to help! Based on what you shared with me it looks like you: May have a vaginosis due to bacteria  Vaginosis is an inflammation of the vagina that can result in discharge, itching and pain. The cause is usually a change in the normal balance of vaginal bacteria or an infection. Vaginosis can also result from reduced estrogen levels after menopause.  The most common causes of vaginosis are:   Bacterial vaginosis which results from an overgrowth of one on several organisms that are normally present in your vagina.   Yeast infections which are caused by a naturally occurring fungus called candida.   Vaginal atrophy (atrophic vaginosis) which results from the thinning of the vagina from reduced estrogen levels after menopause.   Trichomoniasis which is caused by a parasite and is commonly transmitted by sexual intercourse.  Factors that increase your risk of developing vaginosis include: Medications, such as antibiotics and steroids Uncontrolled diabetes Use of hygiene products such as bubble bath, vaginal spray or vaginal deodorant Douching Wearing damp or tight-fitting clothing Using an intrauterine device (IUD) for birth control Hormonal changes, such as those associated with pregnancy, birth control pills or menopause Sexual activity Having a sexually transmitted infection  Your treatment plan is Metronidazole or Flagyl 500mg twice a day for 7 days.  I have electronically sent this prescription into the pharmacy that you have chosen.  Be sure to take all of the medication as directed. Stop taking any medication if you develop a rash, tongue swelling or shortness of breath. Mothers who are breast feeding should consider pumping and discarding their breast milk while on these antibiotics. However, there is no consensus that infant exposure at these doses would be harmful.  Remember that  medication creams can weaken latex condoms. .   HOME CARE:  Good hygiene may prevent some types of vaginosis from recurring and may relieve some symptoms:  Avoid baths, hot tubs and whirlpool spas. Rinse soap from your outer genital area after a shower, and dry the area well to prevent irritation. Don't use scented or harsh soaps, such as those with deodorant or antibacterial action. Avoid irritants. These include scented tampons and pads. Wipe from front to back after using the toilet. Doing so avoids spreading fecal bacteria to your vagina.  Other things that may help prevent vaginosis include:  Don't douche. Your vagina doesn't require cleansing other than normal bathing. Repetitive douching disrupts the normal organisms that reside in the vagina and can actually increase your risk of vaginal infection. Douching won't clear up a vaginal infection. Use a latex condom. Both female and female latex condoms may help you avoid infections spread by sexual contact. Wear cotton underwear. Also wear pantyhose with a cotton crotch. If you feel comfortable without it, skip wearing underwear to bed. Yeast thrives in moist environments Your symptoms should improve in the next day or two.  GET HELP RIGHT AWAY IF:  You have pain in your lower abdomen ( pelvic area or over your ovaries) You develop nausea or vomiting You develop a fever Your discharge changes or worsens You have persistent pain with intercourse You develop shortness of breath, a rapid pulse, or you faint.  These symptoms could be signs of problems or infections that need to be evaluated by a medical provider now.  MAKE SURE YOU   Understand these instructions. Will watch your condition. Will get help right   away if you are not doing well or get worse.  Thank you for choosing an e-visit.  Your e-visit answers were reviewed by a board certified advanced clinical practitioner to complete your personal care plan. Depending upon the  condition, your plan could have included both over the counter or prescription medications.  Please review your pharmacy choice. Make sure the pharmacy is open so you can pick up prescription now. If there is a problem, you may contact your provider through MyChart messaging and have the prescription routed to another pharmacy.  Your safety is important to us. If you have drug allergies check your prescription carefully.   For the next 24 hours you can use MyChart to ask questions about today's visit, request a non-urgent call back, or ask for a work or school excuse. You will get an email in the next two days asking about your experience. I hope that your e-visit has been valuable and will speed your recovery.  I provided 5 minutes of non face-to-face time during this encounter for chart review and documentation.   

## 2021-12-09 ENCOUNTER — Telehealth: Payer: Medicaid Other | Admitting: Family

## 2021-12-09 DIAGNOSIS — J069 Acute upper respiratory infection, unspecified: Secondary | ICD-10-CM

## 2021-12-09 MED ORDER — FLUTICASONE PROPIONATE 50 MCG/ACT NA SUSP: 2.0000 | Freq: Every day | NASAL | 6 refills | Status: DC

## 2021-12-09 NOTE — Progress Notes (Signed)

## 2021-12-11 ENCOUNTER — Ambulatory Visit: Payer: Medicaid Other | Admitting: Podiatry

## 2022-01-03 ENCOUNTER — Encounter: Payer: Self-pay | Admitting: Family

## 2022-01-03 ENCOUNTER — Ambulatory Visit (INDEPENDENT_AMBULATORY_CARE_PROVIDER_SITE_OTHER): Payer: Medicaid Other | Admitting: Family

## 2022-01-03 VITALS — BP 112/80 | HR 86 | Temp 98.1°F | Ht 65.0 in | Wt 258.4 lb

## 2022-01-03 DIAGNOSIS — F321 Major depressive disorder, single episode, moderate: Secondary | ICD-10-CM | POA: Diagnosis not present

## 2022-01-03 DIAGNOSIS — K219 Gastro-esophageal reflux disease without esophagitis: Secondary | ICD-10-CM | POA: Diagnosis not present

## 2022-01-03 DIAGNOSIS — F411 Generalized anxiety disorder: Secondary | ICD-10-CM | POA: Diagnosis not present

## 2022-01-03 MED ORDER — DULOXETINE HCL 60 MG PO CPEP: 60.0000 mg | ORAL_CAPSULE | Freq: Every day | ORAL | 0 refills | Status: DC

## 2022-01-03 MED ORDER — DULOXETINE HCL 30 MG PO CPEP: ORAL_CAPSULE | ORAL | 0 refills | Status: DC

## 2022-01-03 MED ORDER — BUSPIRONE HCL 10 MG PO TABS: 10.0000 mg | ORAL_TABLET | Freq: Three times a day (TID) | ORAL | 2 refills | Status: DC

## 2022-01-03 NOTE — Patient Instructions (Signed)

## 2022-01-03 NOTE — Progress Notes (Signed)
Subjective:    Patient ID: Genevie Elman, female    DOB: 17-Nov-1995, 26 y.o.   MRN: [id number]  Chief Complaint  Patient presents with   Anxiety   Weight Loss    Wants to try something but has MCD. Patient also wants to check and see if she has Diabetes    Pt presents to the office today for chronic follow up. She reports her GAD and Depression have worsen. She has been off her zoloft for the last 3 weeks.   She found out last week her dad has cancer. She has had several close friends die recently over OD.   Reports she feels angry and having outburst.  Anxiety Presents for follow-up visit. Symptoms include decreased concentration, depressed mood, excessive worry, hyperventilation, insomnia, irritability, nervous/anxious behavior, obsessions, palpitations, panic and restlessness. Patient reports no suicidal ideas ("better off dead", but no plan). Symptoms occur constantly.    Depression        This is a chronic problem.  The current episode started more than 1 year ago.   The onset quality is gradual.   The problem occurs intermittently.  Associated symptoms include decreased concentration, fatigue, helplessness, insomnia, irritable, restlessness and sad.  Associated symptoms include no suicidal ideas ("better off dead", but no plan).  Past treatments include nothing.  Past medical history includes anxiety.   Gastroesophageal Reflux She complains of belching, heartburn and a hoarse voice. This is a chronic problem. The current episode started more than 1 year ago. The problem occurs occasionally. Associated symptoms include fatigue. The treatment provided moderate relief.      Review of Systems  Constitutional:  Positive for fatigue and irritability.  HENT:  Positive for hoarse voice.   Cardiovascular:  Positive for palpitations.  Gastrointestinal:  Positive for heartburn.  Psychiatric/Behavioral:  Positive for decreased concentration and depression. Negative for suicidal ideas  ("better off dead", but no plan). The patient is nervous/anxious and has insomnia.   All other systems reviewed and are negative.      Objective:   Physical Exam Vitals reviewed.  Constitutional:      General: She is irritable. She is not in acute distress.    Appearance: She is well-developed. She is obese.  HENT:     Head: Normocephalic and atraumatic.     Right Ear: Tympanic membrane normal.     Left Ear: Tympanic membrane normal.  Eyes:     Pupils: Pupils are equal, round, and reactive to light.  Neck:     Thyroid: No thyromegaly.  Cardiovascular:     Rate and Rhythm: Normal rate and regular rhythm.     Heart sounds: Normal heart sounds. No murmur heard. Pulmonary:     Effort: Pulmonary effort is normal. No respiratory distress.     Breath sounds: Normal breath sounds. No wheezing.  Abdominal:     General: Bowel sounds are normal. There is no distension.     Palpations: Abdomen is soft.     Tenderness: There is no abdominal tenderness.  Musculoskeletal:        General: No tenderness. Normal range of motion.     Cervical back: Normal range of motion and neck supple.  Skin:    General: Skin is warm and dry.  Neurological:     Mental Status: She is alert and oriented to person, place, and time.     Cranial Nerves: No cranial nerve deficit.     Deep Tendon Reflexes: Reflexes are normal and symmetric.  Psychiatric:  Mood and Affect: Mood is anxious and depressed. Affect is tearful. Affect is not flat.        Behavior: Behavior normal.        Thought Content: Thought content normal.        Judgment: Judgment normal.       BP 112/80   Pulse 86   Temp 98.1 F (36.7 C) (Temporal)   Ht '5\' 5"'$  (1.651 m)   Wt 258 lb 6.4 oz (117.2 kg)   BMI 43.00 kg/m      Assessment & Plan:  Mirtha Jain comes in today with chief complaint of Anxiety and Weight Loss (Wants to try something but has MCD. Patient also wants to check and see if she has Diabetes )   Diagnosis and  orders addressed:  1. GAD (generalized anxiety disorder) - Ambulatory referral to Psychiatry - busPIRone (BUSPAR) 10 MG tablet; Take 1 tablet (10 mg total) by mouth 3 (three) times daily.  Dispense: 90 tablet; Refill: 2 - DULoxetine (CYMBALTA) 30 MG capsule; Take 1 capsule (30 mg total) by mouth daily for 14 days, THEN 2 capsules (60 mg total) daily for 14 days.  Dispense: 42 capsule; Refill: 0 - DULoxetine (CYMBALTA) 60 MG capsule; Take 1 capsule (60 mg total) by mouth daily.  Dispense: 90 capsule; Refill: 0  2. Depression, major, single episode, moderate ([place]) - Ambulatory referral to Psychiatry - busPIRone (BUSPAR) 10 MG tablet; Take 1 tablet (10 mg total) by mouth 3 (three) times daily.  Dispense: 90 tablet; Refill: 2 - DULoxetine (CYMBALTA) 30 MG capsule; Take 1 capsule (30 mg total) by mouth daily for 14 days, THEN 2 capsules (60 mg total) daily for 14 days.  Dispense: 42 capsule; Refill: 0 - DULoxetine (CYMBALTA) 60 MG capsule; Take 1 capsule (60 mg total) by mouth daily.  Dispense: 90 capsule; Refill: 0  3. Gastroesophageal reflux disease without esophagitis   Will start Cymbalta 30 mg for 1-2 weeks then increase to 60 mg  Buspar 10 mg TID prn  She will scheduled [facility] screening  Referral to [facility] pending  Stress management  RTO in 1 month   Evelina Dun, FNP

## 2022-01-09 ENCOUNTER — Ambulatory Visit: Payer: Medicaid Other | Admitting: Family Medicine

## 2022-01-10 ENCOUNTER — Encounter: Payer: Self-pay | Admitting: Family

## 2022-01-10 ENCOUNTER — Ambulatory Visit: Payer: Medicaid Other | Admitting: Family Medicine

## 2022-01-11 ENCOUNTER — Telehealth: Payer: Self-pay

## 2022-01-11 NOTE — Telephone Encounter (Signed)
Patient called stating that she is having a lot of chest pain, mid and upper back pain, lower back feels numb at times. also having headache and vomiting x 3 days. I advised patient based off her symptoms that Dr.Walalce would probably want her to go to the ED but I would send a message back to him. Secure chat sent.   Per Dr.Wallace:  Yeah I agree or she can see her PCP since her infection treatment was about 15 months ago so most likely not related but I agree based on the symptoms she is describing sooner eval better than later   Patient doesn't have a working phone. Patient requested that a MyChart message be sent to her.    [place], CMA

## 2022-01-17 ENCOUNTER — Emergency Department (HOSPITAL_COMMUNITY): Payer: Medicaid Other

## 2022-01-17 ENCOUNTER — Encounter (HOSPITAL_COMMUNITY): Payer: Self-pay

## 2022-01-17 ENCOUNTER — Emergency Department (HOSPITAL_COMMUNITY)
Admission: EM | Admit: 2022-01-17 | Discharge: 2022-01-17 | Disposition: A | Discharge status: Home / Self Care | Payer: Medicaid Other | Attending: Emergency Medicine | Admitting: Emergency Medicine

## 2022-01-17 ENCOUNTER — Other Ambulatory Visit: Payer: Self-pay

## 2022-01-17 DIAGNOSIS — R079 Chest pain, unspecified: Secondary | ICD-10-CM | POA: Diagnosis present

## 2022-01-17 DIAGNOSIS — R0789 Other chest pain: Secondary | ICD-10-CM | POA: Diagnosis not present

## 2022-01-17 MED ORDER — IBUPROFEN 800 MG PO TABS: 800.0000 mg | ORAL_TABLET | Freq: Three times a day (TID) | ORAL | 0 refills | Status: DC | PRN

## 2022-01-17 NOTE — ED Provider Notes (Signed)
[facility] EMERGENCY DEPARTMENT Provider Note   CSN: [id number] Arrival date & time: 01/17/22  0720     History  Chief Complaint  Patient presents with   Chest Pain    Colleen Valentine is a 26 y.o. female.  Patient has a history of seizures and depressions.  She complains of left-sided chest discomfort with movement and inspiration.  The history is provided by the patient and medical records. No language interpreter was used.  Chest Pain Pain location:  L chest Pain quality: aching   Pain radiates to:  Does not radiate Pain severity:  Mild Onset quality:  Sudden Timing:  Constant Chronicity:  New Context: movement   Context: not breathing   Relieved by:  Nothing Associated symptoms: no abdominal pain, no back pain, no cough, no fatigue and no headache        Home Medications Prior to Admission medications   Medication Sig Start Date End Date Taking? Authorizing Provider  ibuprofen (ADVIL) 800 MG tablet Take 1 tablet (800 mg total) by mouth every 8 (eight) hours as needed for moderate pain. 01/17/22  Yes Milton Ferguson, MD  busPIRone (BUSPAR) 10 MG tablet Take 1 tablet (10 mg total) by mouth 3 (three) times daily. 01/03/22   Sharion Balloon, FNP  DULoxetine (CYMBALTA) 30 MG capsule Take 1 capsule (30 mg total) by mouth daily for 14 days, THEN 2 capsules (60 mg total) daily for 14 days. 01/03/22 01/31/22  Sharion Balloon, FNP  DULoxetine (CYMBALTA) 60 MG capsule Take 1 capsule (60 mg total) by mouth daily. 01/30/22   Evelina Dun A, FNP  pantoprazole (PROTONIX) 40 MG tablet Take 1 tablet (40 mg total) by mouth daily. 05/17/21   Renee Rival, FNP  valACYclovir (VALTREX) 500 MG tablet Take 500 mg by mouth 2 (two) times daily.    [provider]      Allergies    Patient has no known allergies.    Review of Systems   Review of Systems  Constitutional:  Negative for appetite change and fatigue.  HENT:  Negative for congestion, ear discharge and sinus pressure.    Eyes:  Negative for discharge.  Respiratory:  Negative for cough.   Cardiovascular:  Positive for chest pain.  Gastrointestinal:  Negative for abdominal pain and diarrhea.  Genitourinary:  Negative for frequency and hematuria.  Musculoskeletal:  Negative for back pain.  Skin:  Negative for rash.  Neurological:  Negative for seizures and headaches.  Psychiatric/Behavioral:  Negative for hallucinations.     Physical Exam Updated Vital Signs BP 114/76 (BP Location: Left Arm)   Pulse 78   Temp 98.1 F (36.7 C) (Oral)   Resp 16   Ht '5\' 5"'$  (1.651 m)   Wt 121.1 kg   LMP 01/08/2022   SpO2 100%   BMI 44.43 kg/m  Physical Exam Vitals and nursing note reviewed.  Constitutional:      Appearance: She is well-developed.  HENT:     Head: Normocephalic.     Nose: Nose normal.  Eyes:     General: No scleral icterus.    Conjunctiva/sclera: Conjunctivae normal.  Neck:     Thyroid: No thyromegaly.  Cardiovascular:     Rate and Rhythm: Normal rate and regular rhythm.     Heart sounds: No murmur heard.    No friction rub. No gallop.  Pulmonary:     Breath sounds: No stridor. No wheezing or rales.  Chest:     Chest wall: Tenderness present.  Abdominal:     General: There is no distension.     Tenderness: There is no abdominal tenderness. There is no rebound.  Musculoskeletal:        General: Normal range of motion.     Cervical back: Neck supple.  Lymphadenopathy:     Cervical: No cervical adenopathy.  Skin:    Findings: No erythema or rash.  Neurological:     Mental Status: She is alert and oriented to person, place, and time.     Motor: No abnormal muscle tone.     Coordination: Coordination normal.  Psychiatric:        Behavior: Behavior normal.     ED Results / Procedures / Treatments   Labs (all labs ordered are listed, but only abnormal results are displayed) Labs Reviewed - No data to display  EKG None  Radiology DG Chest 2 View  Result Date:  01/17/2022 CLINICAL DATA:  Chest pain, cough EXAM: CHEST - 2 VIEW COMPARISON:  05/02/2021 FINDINGS: Normal heart size, mediastinal contours, and pulmonary vascularity. Minimal peribronchial thickening. No pulmonary infiltrate, pleural effusion, or pneumothorax. Bones unremarkable. IMPRESSION: Minimal peribronchial thickening which could reflect bronchitis, asthma, or smoking. No acute infiltrate. Electronically Signed   By: Lavonia Dana M.D.   On: 01/17/2022 08:56    Procedures Procedures    Medications Ordered in ED Medications - No data to display  ED Course/ Medical Decision Making/ A&P                           Medical Decision Making Amount and/or Complexity of Data Reviewed Radiology: ordered. ECG/medicine tests: ordered.  Risk Prescription drug management.  This patient presents to the ED for concern of chest pain, this involves an extensive number of treatment options, and is a complaint that carries with it a high risk of complications and morbidity.  The differential diagnosis includes this wall pain, anxiety, pneumonia   Co morbidities that complicate the patient evaluation  Seizures and depression   Additional history obtained:  Additional history obtained from patient External records from outside source obtained and reviewed including hospital records   Lab Tests:  No labs done  Imaging Studies ordered:  I ordered imaging studies including chest x-ray I independently visualized and interpreted imaging which showed mild bronchitis I agree with the radiologist interpretation   Cardiac Monitoring: / EKG:  The patient was maintained on a cardiac monitor.  I personally viewed and interpreted the cardiac monitored which showed an underlying rhythm of: Normal sinus rhythm   Consultations Obtained: No consultants Problem List / ED Course / Critical interventions / Medication management  Seizures and chest pain I ordered medication including Motrin for  pain Reevaluation of the patient after these medicines showed that the patient improved I have reviewed the patients home medicines and have made adjustments as needed   Social Determinants of Health:  None   Test / Admission - Considered:  None  Patient with atypical chest pain.  She is put on Motrin and will follow-up with PCP        Final Clinical Impression(s) / ED Diagnoses Final diagnoses:  Atypical chest pain    Rx / DC Orders ED Discharge Orders          Ordered    ibuprofen (ADVIL) 800 MG tablet  Every 8 hours PRN        01/17/22 1052  Milton Ferguson, MD 01/17/22 [phone]

## 2022-01-17 NOTE — Discharge Instructions (Signed)
Follow-up with your family doctor in a couple weeks for recheck. 

## 2022-01-17 NOTE — ED Triage Notes (Signed)
Pt presents to ED with complaints of left sided chest pain and nausea x 1 week, states it wakes her up out of her sleep every night since starting Cymbalta and Buspar 1 week ago. Pt quit Cymbalta 4 days ago. Pt states laying on her side or smoking marijuana will help relieve the symptoms.

## 2022-02-12 ENCOUNTER — Encounter: Payer: Self-pay | Admitting: Family

## 2022-02-12 ENCOUNTER — Ambulatory Visit (INDEPENDENT_AMBULATORY_CARE_PROVIDER_SITE_OTHER): Payer: Medicaid Other | Admitting: Family

## 2022-02-12 VITALS — Temp 97.6°F | Ht 65.0 in | Wt 258.0 lb

## 2022-02-12 DIAGNOSIS — F321 Major depressive disorder, single episode, moderate: Secondary | ICD-10-CM | POA: Diagnosis not present

## 2022-02-12 DIAGNOSIS — Z23 Encounter for immunization: Secondary | ICD-10-CM | POA: Diagnosis not present

## 2022-02-12 DIAGNOSIS — R55 Syncope and collapse: Secondary | ICD-10-CM | POA: Diagnosis not present

## 2022-02-12 DIAGNOSIS — F411 Generalized anxiety disorder: Secondary | ICD-10-CM | POA: Diagnosis not present

## 2022-02-12 MED ORDER — DESVENLAFAXINE SUCCINATE ER 100 MG PO TB24: 100.0000 mg | ORAL_TABLET | Freq: Every day | ORAL | 1 refills | Status: DC

## 2022-02-12 MED ORDER — DESVENLAFAXINE SUCCINATE ER 50 MG PO TB24: 50.0000 mg | ORAL_TABLET | Freq: Every day | ORAL | 0 refills | Status: DC

## 2022-02-12 MED ORDER — BUSPIRONE HCL 15 MG PO TABS: 15.0000 mg | ORAL_TABLET | Freq: Three times a day (TID) | ORAL | 2 refills | Status: DC | PRN

## 2022-02-12 NOTE — Progress Notes (Signed)
Subjective:    Patient ID: Colleen Valentine, female    DOB: [birth year], 26 y.o.   MRN: [id number]  Chief Complaint  Patient presents with   Follow-up    1 mth on GAD has not been taking meds. Cycle is late. Patient states she know she is not pregnant because she has not had sex with men,. Flu shot today    Pt presents to the office today to recheck GAD and depression. She was started on Cymbalta 30 mg then increased to 60 mg and Buspar 10 mg TID prn.   States the Cymbalta caused palpitations and stopped. She reports she took the Buspar, but lost the medication a few days ago.  Anxiety Presents for follow-up visit. Symptoms include depressed mood, excessive worry, irritability, nervous/anxious behavior and restlessness. Symptoms occur occasionally. The severity of symptoms is moderate.    Depression        This is a chronic problem.  The current episode started more than 1 year ago.   The onset quality is gradual.   Associated symptoms include fatigue, helplessness, hopelessness, irritable, restlessness and sad.  Past treatments include nothing.  Past medical history includes anxiety.   Loss of Consciousness This is a new problem. The current episode started 1 to 4 weeks ago. She lost consciousness for a period of less than 1 minute. The symptoms are aggravated by standing.      Review of Systems  Constitutional:  Positive for fatigue and irritability.  Cardiovascular:  Positive for syncope.  Psychiatric/Behavioral:  Positive for depression. The patient is nervous/anxious.   All other systems reviewed and are negative.      Objective:   Physical Exam Vitals reviewed.  Constitutional:      General: She is irritable. She is not in acute distress.    Appearance: She is well-developed. She is obese.  HENT:     Head: Normocephalic and atraumatic.  Eyes:     Pupils: Pupils are equal, round, and reactive to light.  Neck:     Thyroid: No thyromegaly.  Cardiovascular:     Rate and  Rhythm: Normal rate and regular rhythm.     Heart sounds: Normal heart sounds. No murmur heard. Pulmonary:     Effort: Pulmonary effort is normal. No respiratory distress.     Breath sounds: Normal breath sounds. No wheezing.  Abdominal:     General: Bowel sounds are normal. There is no distension.     Palpations: Abdomen is soft.     Tenderness: There is no abdominal tenderness.  Musculoskeletal:        General: No tenderness. Normal range of motion.     Cervical back: Normal range of motion and neck supple.  Skin:    General: Skin is warm and dry.  Neurological:     Mental Status: She is alert and oriented to person, place, and time.     Cranial Nerves: No cranial nerve deficit.     Deep Tendon Reflexes: Reflexes are normal and symmetric.  Psychiatric:        Behavior: Behavior normal.        Thought Content: Thought content normal.        Judgment: Judgment normal.   Temp 97.6 F (36.4 C) (Temporal)   Ht '5\' 5"'  (1.651 m)   Wt 258 lb (117 kg)   LMP 01/08/2022   BMI 42.93 kg/m      Assessment & Plan:   Rayshell Goecke comes in today with chief complaint of  Follow-up (1 mth on GAD has not been taking meds. Cycle is late. Patient states she know she is not pregnant because she has not had sex with men,. Flu shot today )   Diagnosis and orders addressed:  1. Need for immunization against influenza - Flu Vaccine QUAD 63moIM (Fluarix, Fluzone & Alfiuria Quad PF)  2. Anxiety state Will increase Buspar to 15 mg from 10 mg Will add Pristiq 50 mg then next month increase to 100 mg Stress management  - busPIRone (BUSPAR) 15 MG tablet; Take 1 tablet (15 mg total) by mouth 3 (three) times daily as needed.  Dispense: 90 tablet; Refill: 2 - desvenlafaxine (PRISTIQ) 50 MG 24 hr tablet; Take 1 tablet (50 mg total) by mouth daily.  Dispense: 30 tablet; Refill: 0 - desvenlafaxine (PRISTIQ) 100 MG 24 hr tablet; Take 1 tablet (100 mg total) by mouth daily.  Dispense: 90 tablet; Refill: 1 -  EKG 12-Lead  3. Depression, major, single episode, moderate ([facility]) - busPIRone (BUSPAR) 15 MG tablet; Take 1 tablet (15 mg total) by mouth 3 (three) times daily as needed.  Dispense: 90 tablet; Refill: 2 - desvenlafaxine (PRISTIQ) 50 MG 24 hr tablet; Take 1 tablet (50 mg total) by mouth daily.  Dispense: 30 tablet; Refill: 0 - desvenlafaxine (PRISTIQ) 100 MG 24 hr tablet; Take 1 tablet (100 mg total) by mouth daily.  Dispense: 90 tablet; Refill: 1 - EKG 12-Lead  4. Syncope, unspecified syncope type -Labs pending  - CMP14+EGFR - Anemia Profile B - TSH - EKG 12-Lead   Labs pending Health Maintenance reviewed Diet and exercise encouraged  Follow up plan: 1 month   CEvelina Dun FNP

## 2022-02-12 NOTE — Patient Instructions (Signed)

## 2022-02-13 LAB — ANEMIA PROFILE B
Basophils Absolute: 0 10*3/uL (ref 0.0–0.2)
Basos: 1 %
EOS (ABSOLUTE): 0.2 10*3/uL (ref 0.0–0.4)
Eos: 3 %
Ferritin: 43 ng/mL (ref 15–150)
Folate: 4 ng/mL (ref >3.0)
Hematocrit: 36.8 % (ref 34.0–46.6)
Hemoglobin: 12.8 g/dL (ref 11.1–15.9)
Immature Grans (Abs): 0 10*3/uL (ref 0.0–0.1)
Immature Granulocytes: 0 %
Iron Saturation: 25 % (ref 15–55)
Iron: 79 ug/dL (ref 27–159)
Lymphocytes Absolute: 1.9 10*3/uL (ref 0.7–3.1)
Lymphs: 31 %
MCH: 29.9 pg (ref 26.6–33.0)
MCHC: 34.8 g/dL (ref 31.5–35.7)
MCV: 86 fL (ref 79–97)
Monocytes Absolute: 0.3 10*3/uL (ref 0.1–0.9)
Monocytes: 5 %
Neutrophils Absolute: 3.5 10*3/uL (ref 1.4–7.0)
Neutrophils: 60 %
Platelets: 216 10*3/uL (ref 150–450)
RBC: 4.28 x10E6/uL (ref 3.77–5.28)
RDW: 14.4 % (ref 11.7–15.4)
Retic Ct Pct: 3.6 % — ABNORMAL HIGH (ref 0.6–2.6)
Total Iron Binding Capacity: 321 ug/dL (ref 250–450)
UIBC: 242 ug/dL (ref 131–425)
Vitamin B-12: 363 pg/mL (ref 232–1245)
WBC: 5.9 10*3/uL (ref 3.4–10.8)

## 2022-02-13 LAB — CMP14+EGFR
ALT: 20 IU/L (ref 0–32)
AST: 17 IU/L (ref 0–40)
Albumin/Globulin Ratio: 1.9 (ref 1.2–2.2)
Albumin: 4.5 g/dL (ref 4.0–5.0)
Alkaline Phosphatase: 87 IU/L (ref 44–121)
BUN/Creatinine Ratio: 18 (ref 9–23)
BUN: 13 mg/dL (ref 6–20)
Bilirubin Total: 0.6 mg/dL (ref 0.0–1.2)
CO2: 22 mmol/L (ref 20–29)
Calcium: 9.4 mg/dL (ref 8.7–10.2)
Chloride: 101 mmol/L (ref 96–106)
Creatinine, Ser: 0.71 mg/dL (ref 0.57–1.00)
Globulin, Total: 2.4 g/dL (ref 1.5–4.5)
Glucose: 106 mg/dL — ABNORMAL HIGH (ref 70–99)
Potassium: 4.1 mmol/L (ref 3.5–5.2)
Sodium: 138 mmol/L (ref 134–144)
Total Protein: 6.9 g/dL (ref 6.0–8.5)
eGFR: 122 mL/min/{1.73_m2} (ref >59)

## 2022-02-13 LAB — TSH: TSH: 1.7 u[IU]/mL (ref 0.450–4.500)

## 2022-02-14 LAB — HGB A1C W/O EAG: Hgb A1c MFr Bld: 5.1 % (ref 4.8–5.6)

## 2022-02-14 LAB — SPECIMEN STATUS REPORT

## 2022-02-22 MED ORDER — TRAZODONE HCL 100 MG PO TABS: 100.0000 mg | ORAL_TABLET | Freq: Every day | ORAL | 1 refills | Status: DC

## 2022-03-06 DIAGNOSIS — G4700 Insomnia, unspecified: Secondary | ICD-10-CM

## 2022-03-06 MED ORDER — TRAZODONE HCL 100 MG PO TABS: 100.0000 mg | ORAL_TABLET | Freq: Every day | ORAL | 0 refills | Status: DC

## 2022-03-25 ENCOUNTER — Ambulatory Visit (HOSPITAL_COMMUNITY): Payer: Self-pay | Admitting: Psychiatry

## 2022-04-04 ENCOUNTER — Telehealth: Payer: Self-pay | Admitting: Physician Assistant

## 2022-04-04 DIAGNOSIS — J069 Acute upper respiratory infection, unspecified: Secondary | ICD-10-CM

## 2022-04-04 MED ORDER — BENZONATATE 100 MG PO CAPS: 100.0000 mg | ORAL_CAPSULE | Freq: Three times a day (TID) | ORAL | 0 refills | Status: DC | PRN

## 2022-04-04 NOTE — Progress Notes (Signed)

## 2022-04-04 NOTE — Progress Notes (Signed)
I have spent 5 minutes in review of e-visit questionnaire, review and updating patient chart, medical decision making and response to patient.   Estus Krakowski Cody Caitlynn Ju, PA-C    

## 2022-04-16 ENCOUNTER — Telehealth: Payer: Self-pay | Admitting: Physician Assistant

## 2022-04-16 DIAGNOSIS — L0231 Cutaneous abscess of buttock: Secondary | ICD-10-CM

## 2022-04-16 DIAGNOSIS — R3989 Other symptoms and signs involving the genitourinary system: Secondary | ICD-10-CM

## 2022-04-16 MED ORDER — CEPHALEXIN 500 MG PO CAPS: 500.0000 mg | ORAL_CAPSULE | Freq: Two times a day (BID) | ORAL | 0 refills | Status: AC

## 2022-04-16 NOTE — Progress Notes (Signed)
I have spent 5 minutes in review of e-visit questionnaire, review and updating patient chart, medical decision making and response to patient.   Kathlen Sakurai Cody Sheilia Reznick, PA-C    

## 2022-04-16 NOTE — Progress Notes (Signed)

## 2022-04-16 NOTE — [facility] Notes (Signed)
Because of your history of more substantial/complicated abscess, I feel your condition warrants further evaluation and I recommend that you be seen in a face to face visit. The Keflex given at time of your UTI EV will cover for a lot of the causes of cellulitis, so take as directed, but I want you to have an in-person follow-up.   NOTE: There will be NO CHARGE for this eVisit   If you are having a true medical emergency please call 911.      For an urgent face to face visit, [facility] has seven urgent care centers for your convenience:     [facility] [place] [facility] [place] Get Driving Directions [phone] [place], [place] [address]    [facility]) Get Driving Directions [phone] [place], [place] [address]  [facility] [place] [facility]) Get Driving Directions [phone] [address] [place],  [place]  [address]  [facility] at [address] Get Driving Directions  [phone] [address] [place],  [place] [address]   [facility] Get Driving Directions [phone] [address], [place], [place] [address]   [facility] Get Driving Directions  [phone] [address], [place] [address]   [facility] at  Get Driving Directions [phone] [address], [place], [place] [address]  Your MyChart E-visit questionnaire answers were reviewed by a board certified advanced clinical practitioner to complete your personal care plan based on your specific symptoms.  Thank you for using e-Visits.

## 2022-06-03 ENCOUNTER — Encounter (HOSPITAL_COMMUNITY): Payer: Self-pay | Admitting: Psychiatry

## 2022-06-03 ENCOUNTER — Ambulatory Visit (HOSPITAL_BASED_OUTPATIENT_CLINIC_OR_DEPARTMENT_OTHER): Payer: Medicaid Other | Admitting: Psychiatry

## 2022-06-03 VITALS — BP 117/80 | HR 60 | Temp 97.8°F | Ht 65.0 in | Wt 256.0 lb

## 2022-06-03 DIAGNOSIS — F603 Borderline personality disorder: Secondary | ICD-10-CM | POA: Diagnosis not present

## 2022-06-03 MED ORDER — OXCARBAZEPINE 150 MG PO TABS: ORAL_TABLET | ORAL | 0 refills | Status: DC

## 2022-06-03 MED ORDER — PROPRANOLOL HCL 10 MG PO TABS: 10.0000 mg | ORAL_TABLET | Freq: Two times a day (BID) | ORAL | 1 refills | Status: DC | PRN
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
## 2022-06-04 ENCOUNTER — Encounter (HOSPITAL_COMMUNITY): Payer: Self-pay | Admitting: Psychiatry

## 2022-06-13 ENCOUNTER — Ambulatory Visit (HOSPITAL_COMMUNITY): Payer: Medicaid Other | Admitting: Clinical

## 2022-06-13 DIAGNOSIS — Z91199 Patient's noncompliance with other medical treatment and regimen due to unspecified reason: Secondary | ICD-10-CM

## 2022-06-13 NOTE — [facility] Notes (Signed)
Patient was scheduled to have a Comprehensive Clinical Assessment today at 8:00am.  She did not show up and was listed as a no-show by the front office.  Encounter Diagnosis  Name Primary?   No-show for appointment Yes     Selmer Dominion, LCSW 06/13/2022, 8:49 AM

## 2022-06-27 ENCOUNTER — Telehealth (INDEPENDENT_AMBULATORY_CARE_PROVIDER_SITE_OTHER): Payer: Self-pay | Admitting: Family Medicine

## 2022-06-27 NOTE — [facility] Notes (Signed)
Pt does not have insurance and does not want to complete visit.

## 2022-07-01 ENCOUNTER — Encounter (HOSPITAL_COMMUNITY): Payer: Self-pay | Admitting: Psychiatry

## 2022-07-01 ENCOUNTER — Ambulatory Visit (HOSPITAL_BASED_OUTPATIENT_CLINIC_OR_DEPARTMENT_OTHER): Payer: No Typology Code available for payment source | Admitting: Psychiatry

## 2022-07-01 DIAGNOSIS — F603 Borderline personality disorder: Secondary | ICD-10-CM | POA: Diagnosis not present

## 2022-07-01 MED ORDER — TRAZODONE HCL 100 MG PO TABS: 100.0000 mg | ORAL_TABLET | Freq: Every evening | ORAL | 1 refills | Status: DC | PRN

## 2022-07-01 MED ORDER — OXCARBAZEPINE 300 MG PO TABS: 300.0000 mg | ORAL_TABLET | Freq: Two times a day (BID) | ORAL | 2 refills | Status: DC

## 2022-07-01 NOTE — Progress Notes (Signed)
[place] MD/PA/NP OP Progress Note  07/01/2022 12:03 PM Colleen Valentine  MRN:  [id number]  Visit Diagnosis:    ICD-10-CM   1. Borderline personality disorder ([place])  F60.3 Oxcarbazepine (TRILEPTAL) 300 MG tablet    traZODone (DESYREL) 100 MG tablet      Assessment: Colleen Valentine is a 27 y.o. female with a history of oppositional defiant disorder, ADHD, anxiety, depression, borderline personality disorder, PTSD, and marijuana use disorder. who presented to [facility] for initial evaluation on 06/03/2022.    During initial evaluation patient reported symptoms of mood lability including depressed mood, irritability, anxiety, and elevated mood.  She also endorsed symptoms of impulsive behaviors, chronic feelings of emptiness, emotional instability, real or perceived fear of abandonment, dissociative symptoms, and a past history of trauma.  Of note patient did endorse a history of nonsuicidal self-injury and poor sense of self that had improved after connecting with CBT/DBT groups.  Based on patient's symptoms and her past history of borderline personality disorder it does appear she is having reoccurrence of the symptoms and would benefit from restarting on medications and reconnecting with therapy/groups.  Colleen Valentine presents for follow-up evaluation. Today, 07/01/22, patient reports an improvement in her irritability, mood lability, and anxiety over the past month.  He attributes the improvement to the Trileptal and reports nausea being the only adverse side effect.  Discussed options for the nausea and patient felt like it was manageable.  It was suggested she try taking medication food to see if that improved her symptoms.  She used the propranolol a couple times with good effect for intermittent anxiety and denies any adverse effects.  Sleep does remain an issue and we will start trazodone 50 to 100 mg at bedtime as needed for insomnia.  Risk and benefits were discussed.   Patient will connect with a therapist next week and has ordered a DBT workbook that she plans to go through.  Plan: - Continue Trileptal 300 mg BID - Continue Propranolol 10 mg BID prn anxiety - Start trazodone 50-100 QHS prn for insomnia - Therapy appointment on 2/12 - [name] information provided - CMP, CBC, iron panel, Vit B12, TSH, A1c reviewed - EKG from 02/12/22 reviewed QTC 398 - Crisis resources reviewed - Follow up in 6 weeks   Chief Complaint:  Chief Complaint  Patient presents with   Follow-up   HPI: Colleen Valentine presents reporting that things have been going better for her over the past month. She notes that since starting the trileptal she has felt less anxious and irritable.  She reports only having 3 episodes of anger this past month which have been better controlled than they had been previously.  Colleen Valentine also notes using propranolol a few times with good benefit however has not needed to take it too often.  The only adverse side effect she has noticed in the medication is some nausea with the Trileptal however she feels like this is manageable.  We did discuss the possibility of taking food to help with this.  Colleen Valentine does still report struggling with insomnia as her mind races when she tries to go to bed.  She believes she is sleeping around 4 to 6 hours a night after lying awake for 3 hours.  We discussed sleep hygiene and the possibility of starting trazodone as needed for sleep.  Patient notes she did try trazodone before and it was unhelpful at 25 mg dose however was open to trying higher dosages.  Also reports  that she has cut back on her marijuana use and is now smoking closer to 1-3 times a day.  She was commended for her good work and encouraged to keep working on them.  She has an upcoming therapy appointment next week and reports ordering a DBT workbook which Jayce plans to go over on her own.  Past Psychiatric History: She has a past psychiatric history of  oppositional defiant disorder, ADHD, anxiety, depression, borderline personality disorder, PTSD, Schizoaffective disorder, and marijuana use disorder.  At 16 she was in psychiatric residential facility following a suicide attempt after her mom passed. Learned CBT and DBT there that she carried with her and had helped since.  Most notably the cognitive re framing.  She has seen a therapist intermittently in the past  Medications- Pristiq, Zoloft, Prozac, Lexapro, Wellbutrin, Cymbalta (palpitations), BuSpar, trazodone, lamictal, seroquel, hydroxyzine, ativan, and gabapentin.  She endorses daily marijuana want to use to help with anxiety symptoms.  She does note that it negatively impacts her concentration and increases her appetite.  Risk of marijuana use were discussed and adverse effects on anxiety were explained. Alcohol social use of one drink every couple weeks Cocaine use as a teenager   Past Medical History:  Past Medical History:  Diagnosis Date   Acute pyelonephritis 05/17/2018   Anemia    Anxiety    Complication of anesthesia    Depression    GERD (gastroesophageal reflux disease)    Herpes genitalia    Insomnia    Obesity    Polysubstance abuse ([place]) 04/01/2011   PONV (postoperative nausea and vomiting)    Seizures ([place])    once in 2016 due to alcohol poisioning   Suicide [facility])     Past Surgical History:  Procedure Laterality Date   CESAREAN SECTION N/A 08/30/2020   Procedure: CESAREAN SECTION;  Surgeon: Gwynne Edinger, MD;  Location: [facility] LD [facility]S;  Service: Obstetrics;  Laterality: N/A;   CHOLECYSTECTOMY  2015   COSMETIC SURGERY  Age 48   dog bite to face   IR FLUORO GUIDED NEEDLE PLC ASPIRATION/INJECTION LOC  08/22/2020   IR US GUIDE BX ASP/DRAIN  08/22/2020   LAMINECTOMY N/A 08/22/2020   Procedure: THORACIC TWELVE TO LUMBAR ONELAMINECTOMY FOR DRAINAGE OF EPIDURAL ABSCESS;  Surgeon: Kristeen Miss, MD;  Location: [place];  Service: Neurosurgery;  Laterality: N/A;   Family  History:  Family History  Problem Relation Age of Onset   Alcohol abuse Mother    Stroke Mother    Bipolar disorder Mother    Drug abuse Mother    Alcohol abuse Father    Bipolar disorder Father    Drug abuse Father    Diabetes Father    Heart failure Father    Cancer Maternal Aunt        "stomach cancer"   Diabetes Maternal Aunt    Crohn's disease Paternal Aunt    Crohn's disease Paternal Aunt    Crohn's disease Paternal Uncle    Colon cancer Paternal Uncle    Breast cancer Maternal Grandmother    Colon cancer Maternal Grandfather    Diabetes type II Paternal Grandfather    Heart failure Paternal Grandfather    Celiac disease Neg Hx    Cervical cancer Neg Hx    Lung cancer Neg Hx     Social History:  Social History   Socioeconomic History   Marital status: Single    Spouse name: Not on file   Number of children: 2  Years of education: Not on file   Highest education level: GED or equivalent  Occupational History   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   Sexual activity: Yes    Partners: Male    Birth control/protection: Other-see comments    Comment: does not use condom  Other Topics Concern   Not on file  Social History Narrative   Lives with her dad and her two children, will be going to [employer] in the spring. Works ar [employer].    Social Determinants of Health   Financial  Resource Strain: Low Risk  (09/06/2021)   Overall Financial Resource Strain (CARDIA)    Difficulty of Paying Living Expenses: Not hard at all  Food Insecurity: No Food Insecurity (09/06/2021)   Hunger Vital Sign    Worried About Running Out of Food in the Last Year: Never true    Ran Out of Food in the Last Year: Never true  Transportation Needs: No Transportation Needs (09/06/2021)   PRAPARE - Hydrologist (Medical): No    Lack of Transportation (Non-Medical): No  Physical Activity: Insufficiently Active (09/06/2021)   Exercise Vital Sign    Days of Exercise per Week: 3 days    Minutes of Exercise per Session: 40 min  Stress: Stress Concern Present (09/06/2021)   [place]    Feeling of Stress : Very much  Social Connections: Socially Isolated (09/06/2021)   Social Connection and Isolation Panel [NHANES]    Frequency of Communication with Friends and Family: Three times a week    Frequency of Social Gatherings with Friends and Family: Twice a week    Attends Religious Services: Never    Marine scientist or Organizations: No    Attends Music therapist: Never    Marital Status: Never married    Allergies: No Known Allergies  Current Medications: Current Outpatient Medications  Medication Sig Dispense Refill   traZODone (DESYREL) 100 MG tablet Take 1 tablet (100 mg total) by mouth at bedtime as needed for sleep (insomnia). 30 tablet 1   benzonatate (TESSALON) 100 MG capsule Take 1 capsule (100 mg total) by mouth 3 (three) times daily as needed for cough. 30 capsule 0   ibuprofen (ADVIL) 800 MG tablet Take 1 tablet (800 mg total) by mouth every 8 (eight) hours as needed for moderate pain. 21 tablet 0   Oxcarbazepine (TRILEPTAL) 300 MG tablet Take 1 tablet (300 mg total) by mouth 2 (two) times daily. 60 tablet 2   pantoprazole (PROTONIX) 40 MG tablet Take 1 tablet (40 mg  total) by mouth daily. 30 tablet 3   propranolol (INDERAL) 10 MG tablet Take 1 tablet (10 mg  total) by mouth 2 (two) times daily as needed (anxiety). 60 tablet 1   valACYclovir (VALTREX) 500 MG tablet Take 500 mg by mouth 2 (two) times daily.     No current facility-administered medications for this visit.     Musculoskeletal: Strength & Muscle Tone: within normal limits Gait & Station: normal Patient leans: N/A  Psychiatric Specialty Exam: Review of Systems  not currently breastfeeding.There is no height or weight on file to calculate BMI.  General Appearance: Fairly Groomed  Eye Contact:  Good  Speech:  Clear and Coherent and Normal Rate  Volume:  Normal  Mood:  Euthymic  Affect:  Congruent  Thought Process:  Coherent, Goal Directed, and Linear  Orientation:  Full (Time, Place, and Person)  Thought Content: Logical   Suicidal Thoughts:  No  Homicidal Thoughts:  No  Memory:  NA  Judgement:  Good  Insight:  Good  Psychomotor Activity:  Normal  Concentration:  Concentration: Good  Recall:  Good  Fund of Knowledge: Good  Language: Good  Akathisia:  NA    AIMS (if indicated): not done  Assets:  Communication Skills Desire for Improvement Housing Talents/Skills Transportation Vocational/Educational  ADL's:  Intact  Cognition: WNL  Sleep:  Good   Metabolic Disorder Labs: Lab Results  Component Value Date   HGBA1C 5.1 02/12/2022   MPG 97 07/21/2012   MPG 94 04/01/2011   No results found for: "PROLACTIN" Lab Results  Component Value Date   CHOL 164 07/09/2019   TRIG 199 (H) 07/09/2019   HDL 38 (L) 07/09/2019   CHOLHDL 4.3 07/09/2019   VLDL 12 07/21/2012   LDLCALC 92 07/09/2019   LDLCALC 68 12/16/2015   Lab Results  Component Value Date   TSH 1.700 02/12/2022   TSH 1.030 07/09/2019    Therapeutic Level Labs: No results found for: "LITHIUM" No results found for: "VALPROATE" No results found for: "CBMZ"   Screenings: AUDIT    Flowsheet Row ED to  Hosp-Admission (Discharged) from 07/20/2012 in [place] [facility]  Alcohol Use Disorder Identification Test Final Score (AUDIT) 0      GAD-7    Flowsheet Row Office Visit from 06/03/2022 in [place] [facility] Office Visit from 02/12/2022 in [place] Office Visit from 01/03/2022 in [place] Office Visit from 09/06/2021 in [facility] [place] [facility] [place] Visit from 05/17/2021 in [facility]  Total GAD-7 Score '19 21 21 19 21      '$ PHQ2-9    [place] Office Visit from 06/03/2022 in [place] [facility] Office Visit from 02/12/2022 in [place] Office Visit from 01/03/2022 in [place] Office Visit from 09/06/2021 in [facility] for [place] at [place] Visit from 05/17/2021 in [place] [facility]  PHQ-2 Total Score '3 2 5 4 6  '$ PHQ-9 Total Score '13 11 24 20 23      '$ [place] Office Visit from 06/03/2022 in [place] [facility] from 01/17/2022 in [facility] Emergency Department at [facility] Office Visit from 05/17/2021 in [facility]  C-SSRS RISK CATEGORY No Risk No Risk Error: Q3, 4, or 5 should not be populated when Q2 is No       Collaboration of Care: Collaboration of Care: Medication Management AEB medication prescription  Patient/Guardian  was advised Release of Information must be obtained prior to any record release in order to collaborate their care with an outside provider. Patient/Guardian was advised if they have not already done so to contact the registration department to sign all necessary forms in order for [place] to release information regarding their care.   Consent: Patient/Guardian  gives verbal consent for treatment and assignment of benefits for services provided during this visit. Patient/Guardian expressed understanding and agreed to proceed.    Vista Mink, MD 07/01/2022, 12:03 PM

## 2022-07-08 ENCOUNTER — Encounter (HOSPITAL_COMMUNITY): Payer: Self-pay

## 2022-07-08 ENCOUNTER — Ambulatory Visit (INDEPENDENT_AMBULATORY_CARE_PROVIDER_SITE_OTHER): Payer: No Typology Code available for payment source | Admitting: Clinical

## 2022-07-08 ENCOUNTER — Encounter (HOSPITAL_COMMUNITY): Payer: Self-pay | Admitting: Clinical

## 2022-07-08 DIAGNOSIS — Z8659 Personal history of other mental and behavioral disorders: Secondary | ICD-10-CM

## 2022-07-08 DIAGNOSIS — F603 Borderline personality disorder: Secondary | ICD-10-CM | POA: Diagnosis not present

## 2022-07-08 DIAGNOSIS — F1210 Cannabis abuse, uncomplicated: Secondary | ICD-10-CM | POA: Diagnosis not present

## 2022-07-08 DIAGNOSIS — F330 Major depressive disorder, recurrent, mild: Secondary | ICD-10-CM

## 2022-07-08 NOTE — Addendum Note (Signed)
Addended by: Maretta Los on: 07/08/2022 05:39 PM   Modules accepted: Level of Service
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
## 2022-07-19 ENCOUNTER — Telehealth: Payer: Self-pay

## 2022-07-19 ENCOUNTER — Telehealth (INDEPENDENT_AMBULATORY_CARE_PROVIDER_SITE_OTHER): Payer: Medicaid Other | Admitting: Family

## 2022-07-19 ENCOUNTER — Encounter: Payer: Self-pay | Admitting: Family

## 2022-07-19 DIAGNOSIS — Z09 Encounter for follow-up examination after completed treatment for conditions other than malignant neoplasm: Secondary | ICD-10-CM

## 2022-07-19 DIAGNOSIS — M545 Low back pain, unspecified: Secondary | ICD-10-CM

## 2022-07-19 MED ORDER — DICLOFENAC SODIUM 75 MG PO TBEC: 75.0000 mg | DELAYED_RELEASE_TABLET | Freq: Two times a day (BID) | ORAL | 0 refills | Status: DC

## 2022-07-19 NOTE — Progress Notes (Signed)
Virtual Visit Consent   Colleen Valentine, you are scheduled for a virtual visit with a [facility] provider today. Just as with appointments in the office, your consent must be obtained to participate. Your consent will be active for this visit and any virtual visit you may have with one of our providers in the next 365 days. If you have a MyChart account, a copy of this consent can be sent to you electronically.  As this is a virtual visit, video technology does not allow for your provider to perform a traditional examination. This may limit your provider's ability to fully assess your condition. If your provider identifies any concerns that need to be evaluated in person or the need to arrange testing (such as labs, EKG, etc.), we will make arrangements to do so. Although advances in technology are sophisticated, we cannot ensure that it will always work on either your end or our end. If the connection with a video visit is poor, the visit may have to be switched to a telephone visit. With either a video or telephone visit, we are not always able to ensure that we have a secure connection.  By engaging in this virtual visit, you consent to the provision of healthcare and authorize for your insurance to be billed (if applicable) for the services provided during this visit. Depending on your insurance coverage, you may receive a charge related to this service.  I need to obtain your verbal consent now. Are you willing to proceed with your visit today? Colleen Valentine has provided verbal consent on 07/19/2022 for a virtual visit (video or telephone). Evelina Dun, FNP  Date: 07/19/2022 11:28 AM  Virtual Visit via Video Note   I, Evelina Dun, connected with  Colleen Valentine  ([id number], 04-15-1996) on 07/19/22 at  5:45 PM EST by a video-enabled telemedicine application and verified that I am speaking with the correct person using two identifiers.  Location: Patient: Virtual Visit Location Patient:  Home Provider: Virtual Visit Location Provider: Office/Clinic   I discussed the limitations of evaluation and management by telemedicine and the availability of in person appointments. The patient expressed understanding and agreed to proceed.    History of Present Illness: Colleen Valentine is a 27 y.o. who identifies as a female who was assigned female at birth, and is being seen today for back pain. She went to the ED yesterday and had a MRI that showed, "1. No evidence of recurrent epidural abscess.  2. Unchanged mild degenerative disc disease at L3-L4 and L4-L5. No  stenosis or impingement.  3. 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 "   HPI: Back Pain This is a recurrent problem. The current episode started more than 1 month ago. The problem occurs intermittently. The problem has been gradually worsening since onset. The pain is present in the lumbar spine and gluteal. The quality of the pain is described as aching. The pain is at a severity of 9/10. The pain is moderate. The symptoms are aggravated by bending and twisting. Associated symptoms include tingling and weakness. Pertinent negatives include no leg pain. Risk factors include obesity. She has tried analgesics and muscle relaxant for the symptoms. The treatment provided mild relief.    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[place]) 08/22/2020   Spinal cord compression ([place]) 08/21/2020   Splenomegaly 08/16/2020   Marijuana abuse 07/29/2020   Anemia in pregnancy 07/29/2020   Gestational diabetes mellitus in third trimester 07/29/2020    Gram-positive cocci bacteremia 07/20/2020   Abscess of axilla, left 07/19/2020   Marijuana user 03/28/2020   Gonorrhea 02/04/2020   Chlamydia infection 01/25/2020   Trichomonas infection 11/28/2019   Esophageal dysphagia 02/27/2017   Current smoker 08/12/2016   Genital herpes simplex 04/08/2016   GERD (gastroesophageal reflux disease) 10/17/2015   Scoliosis 05/04/2013   Spondylolysis 05/04/2013   Anxiety state 10/31/2012   ADHD (attention deficit hyperactivity disorder), combined type 07/21/2012   Oppositional defiant disorder 04/01/2011    Allergies: No Known Allergies Medications:  Current Outpatient Medications:    diclofenac (VOLTAREN) 75 MG EC tablet, Take 1 tablet (75 mg total) by mouth 2 (two) times daily., Disp: 30 tablet, Rfl: 0   methocarbamol (ROBAXIN) 500 MG tablet, Take by mouth., Disp: , Rfl:    oxyCODONE-acetaminophen (PERCOCET/ROXICET) 5-325 MG tablet, Take by mouth., Disp: , Rfl:    Oxcarbazepine (TRILEPTAL) 300 MG tablet, Take 1 tablet (300 mg total) by mouth 2 (two) times daily., Disp: 60 tablet, Rfl: 2   pantoprazole (PROTONIX) 40 MG tablet, Take 1 tablet (40 mg total) by mouth daily., Disp: 30 tablet, Rfl: 3   propranolol (INDERAL) 10 MG tablet, Take 1 tablet (10 mg total) by mouth 2 (two) times daily as needed (anxiety)., Disp: 60 tablet, Rfl: 1   traZODone (DESYREL) 100 MG tablet, Take 1 tablet (100 mg total) by mouth at bedtime as needed for sleep (insomnia)., Disp: 30 tablet, Rfl: 1   valACYclovir (VALTREX) 500 MG tablet, Take 500 mg by mouth 2 (two) times daily., Disp: , Rfl:   Observations/Objective: Patient is well-developed, well-nourished in no acute distress.  Resting comfortably  at home.  Head is normocephalic, atraumatic.  No labored breathing.  Speech is clear and coherent with logical content.  Patient is alert and oriented at baseline.  Pain in lumbar with flexion   Assessment and Plan: 1. Acute bilateral low back pain without sciatica -  Ambulatory referral to Physical Therapy - Ambulatory referral to Orthopedic Surgery - diclofenac (VOLTAREN) 75 MG EC tablet; Take 1 tablet (75 mg total) by mouth 2 (two) times daily.  Dispense: 30 tablet; Refill: 0  2. Hospital discharge follow-up  Hospital notes reviewed  Referral to PT and Ortho Start diclofenac BID- no other NSAID's  Follow up if symptoms worsen or do not improve   Follow Up Instructions: I discussed the assessment and treatment plan with the patient. The patient was provided an opportunity to ask questions and all were answered. The patient agreed with the plan and demonstrated an understanding of the instructions.  A copy of instructions were sent to the patient via MyChart unless otherwise noted below.    The patient was advised to call back or seek an in-person evaluation if the symptoms worsen or if the condition fails to improve as anticipated.  Time:  I spent 9 minutes with the patient via telehealth technology discussing the above problems/concerns.    Evelina Dun, FNP

## 2022-07-19 NOTE — Transitions of Care (Post Inpatient/ED Visit) (Signed)
   07/19/2022  Name: Colleen Valentine MRN: [id number] DOB: August 03, 1995  Today's TOC FU Call Status: Today's TOC FU Call Status:: Successful TOC FU Call Competed TOC FU Call Complete Date: 07/19/22  Transition Care Management Follow-up Telephone Call Date of Discharge: 07/18/22 Discharge Facility: Other ([place]) Name of Other (Non[facility]) Discharge Facility: [facility] ER Type of Discharge: Emergency Department Reason for ED Visit:  (low back pain) How have you been since you were released from the hospital?: Better Any questions or concerns?: No  Items Reviewed: Did you receive and understand the discharge instructions provided?: Yes Medications obtained and verified?: Yes (Medications Reviewed) Any new allergies since your discharge?: No Dietary orders reviewed?: NA Do you have support at home?: Yes  Home Care and Equipment/Supplies: [place] Ordered?: NA Any new equipment or medical supplies ordered?: NA  Functional Questionnaire: Do you need assistance with bathing/showering or dressing?: No Do you need assistance with meal preparation?: No Do you need assistance with eating?: No Do you have difficulty maintaining continence: No Do you need assistance with getting out of bed/getting out of a chair/moving?: No Do you have difficulty managing or taking your medications?: No  Folllow up appointments reviewed: PCP Follow-up appointment confirmed?: Yes Date of PCP follow-up appointment?: 07/19/22 Follow-up Provider: [facility] Follow-up appointment confirmed?: NA Do you need transportation to your follow-up appointment?: No Do you understand care options if your condition(s) worsen?: Yes-patient verbalized understanding    [place], [place] Direct Dial [phone]

## 2022-07-23 ENCOUNTER — Telehealth: Payer: Self-pay

## 2022-07-23 MED ORDER — CELECOXIB 200 MG PO CAPS: 200.0000 mg | ORAL_CAPSULE | Freq: Two times a day (BID) | ORAL | 1 refills | Status: DC

## 2022-07-23 NOTE — Telephone Encounter (Signed)
Diclofenac changed to Celebrex per insurance.

## 2022-07-23 NOTE — Telephone Encounter (Signed)
Patient aware and verbalized understanding. °

## 2022-07-23 NOTE — Telephone Encounter (Signed)
Diclofenac is not covered by patient's Medicaid.  Preferred alternatives are:  Celecoxib capsule Ibuprofen tablet Indomethacin capsule Ketorolac tablet Meloxicam tablet Naproxen EC/DR tablet Naproxen tablet Sulindac tablet

## 2022-07-24 ENCOUNTER — Telehealth (INDEPENDENT_AMBULATORY_CARE_PROVIDER_SITE_OTHER): Payer: No Typology Code available for payment source | Admitting: Clinical

## 2022-07-24 ENCOUNTER — Telehealth: Payer: Medicaid Other | Admitting: Family Medicine

## 2022-07-24 ENCOUNTER — Ambulatory Visit
Admission: EM | Admit: 2022-07-24 | Discharge: 2022-07-24 | Disposition: A | Discharge status: Home / Self Care | Payer: Medicaid Other | Attending: Family Medicine | Admitting: Family Medicine

## 2022-07-24 DIAGNOSIS — J0300 Acute streptococcal tonsillitis, unspecified: Secondary | ICD-10-CM | POA: Diagnosis not present

## 2022-07-24 DIAGNOSIS — F603 Borderline personality disorder: Secondary | ICD-10-CM

## 2022-07-24 DIAGNOSIS — F1210 Cannabis abuse, uncomplicated: Secondary | ICD-10-CM

## 2022-07-24 DIAGNOSIS — F330 Major depressive disorder, recurrent, mild: Secondary | ICD-10-CM

## 2022-07-24 DIAGNOSIS — R0989 Other specified symptoms and signs involving the circulatory and respiratory systems: Secondary | ICD-10-CM

## 2022-07-24 DIAGNOSIS — Z8659 Personal history of other mental and behavioral disorders: Secondary | ICD-10-CM

## 2022-07-24 LAB — POCT INFLUENZA A/B
Influenza A, POC: NEGATIVE
Influenza B, POC: NEGATIVE

## 2022-07-24 LAB — POCT RAPID STREP A (OFFICE): Rapid Strep A Screen: POSITIVE — AB

## 2022-07-24 MED ORDER — AMOXICILLIN 875 MG PO TABS: 875.0000 mg | ORAL_TABLET | Freq: Two times a day (BID) | ORAL | 0 refills | Status: DC

## 2022-07-24 MED ORDER — DEXAMETHASONE SODIUM PHOSPHATE 10 MG/ML IJ SOLN
10.0000 mg | Freq: Once | INTRAMUSCULAR | Status: AC
  Administered 2022-07-24: 10 mg via INTRAMUSCULAR

## 2022-07-24 MED ORDER — LIDOCAINE VISCOUS HCL 2 % MT SOLN: 10.0000 mL | OROMUCOSAL | 0 refills | Status: DC | PRN

## 2022-07-24 NOTE — Progress Notes (Signed)
Because reports of bleeding tonsils with strep concern, I feel your condition warrants further evaluation and I recommend that you be seen in a face to face visit.   NOTE: There will be NO CHARGE for this eVisit

## 2022-07-24 NOTE — Discharge Instructions (Signed)
Take the full course of antibiotics even once you start feeling better.  Make sure to change her toothbrush after 2 days on the medication so that you do not reinfect yourself.  You are considered not contagious after 24 hours on the antibiotics.

## 2022-07-24 NOTE — ED Triage Notes (Signed)
Pt reports chills, fatigue, and throat pain x 1 day.  States tonsil stones came out as well and a lot of bleeding. Pt says she taste puss and reports bilateral ear pain.

## 2022-07-24 NOTE — ED Provider Notes (Signed)
[facility]    CSN: [id number] Arrival date & time: 07/24/22  1357      History   Chief Complaint Chief Complaint  Patient presents with   Sore Throat    HPI Colleen Valentine is a 27 y.o. female.   Patient presenting today with 1 day history of fatigue, chills, sore swollen feeling throat.  States that she has been able to get a lot of tonsil stones out since symptoms started and has had bleeding from the tonsils.  States she can taste purulent drainage in her mouth.  Denies cough, congestion, chest pain, shortness of breath, abdominal pain, nausea vomiting or diarrhea.  So far trying over-the-counter pain relievers with minimal relief as well as throat sprays.    Past Medical History:  Diagnosis Date   Acute pyelonephritis 05/17/2018   Anemia    Anxiety    Complication of anesthesia    Depression    GERD (gastroesophageal reflux disease)    Herpes genitalia    Insomnia    Obesity    Polysubstance abuse ([place]) 04/01/2011   PONV (postoperative nausea and vomiting)    Seizures ([place])    once in 2016 due to alcohol poisioning   Suicide [facility])     Patient Active Problem List   Diagnosis Date Noted   Depression, major, single episode, moderate ([place]) 02/12/2022   Syncope 02/12/2022   Acute vaginitis 05/18/2021   Encounter for pregnancy test, result unknown 05/18/2021   Bloody stool 05/18/2021   PTSD (post-traumatic stress disorder) 10/05/2020   Irregular menses 09/05/2020   Epidural abscess    Vertebral osteomyelitis ([place])    MRSA bacteremia    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[place]) 08/22/2020   Spinal cord compression ([place]) 08/21/2020   Splenomegaly 08/16/2020   Marijuana abuse 07/29/2020   Anemia in pregnancy 07/29/2020   Gestational diabetes mellitus in third trimester 07/29/2020   Gram-positive cocci bacteremia 07/20/2020   Abscess of axilla, left 07/19/2020   Marijuana user 03/28/2020   Gonorrhea 02/04/2020   Chlamydia infection 01/25/2020   Trichomonas infection  11/28/2019   Esophageal dysphagia 02/27/2017   Current smoker 08/12/2016   Genital herpes simplex 04/08/2016   GERD (gastroesophageal reflux disease) 10/17/2015   Scoliosis 05/04/2013   Spondylolysis 05/04/2013   Anxiety state 10/31/2012   ADHD (attention deficit hyperactivity disorder), combined type 07/21/2012   Oppositional defiant disorder 04/01/2011    Past Surgical History:  Procedure Laterality Date   CESAREAN SECTION N/A 08/30/2020   Procedure: CESAREAN SECTION;  Surgeon: Gwynne Edinger, MD;  Location: [facility] LD [facility]S;  Service: Obstetrics;  Laterality: N/A;   CHOLECYSTECTOMY  2015   COSMETIC SURGERY  Age 27   dog bite to face   IR FLUORO GUIDED NEEDLE PLC ASPIRATION/INJECTION LOC  08/22/2020   IR US GUIDE BX ASP/DRAIN  08/22/2020   LAMINECTOMY N/A 08/22/2020   Procedure: THORACIC TWELVE TO LUMBAR ONELAMINECTOMY FOR DRAINAGE OF EPIDURAL ABSCESS;  Surgeon: Kristeen Miss, MD;  Location: [facility];  Service: Neurosurgery;  Laterality: N/A;    OB History     Gravida  5   Para  2   Term  1   Preterm  1   AB  3   Living  2      SAB  2   IAB  1   Ectopic      Multiple  0   Live Births  2            Home Medications    Prior  to Admission medications   Medication Sig Start Date End Date Taking? Authorizing Provider  amoxicillin (AMOXIL) 875 MG tablet Take 1 tablet (875 mg total) by mouth 2 (two) times daily. 07/24/22  Yes Volney American, PA-C  lidocaine (XYLOCAINE) 2 % solution Use as directed 10 mLs in the mouth or throat every 3 (three) hours as needed for mouth pain. 07/24/22  Yes Volney American, PA-C  celecoxib (CELEBREX) 200 MG capsule Take 1 capsule (200 mg total) by mouth 2 (two) times daily. 07/23/22   Sharion Balloon, FNP  methocarbamol (ROBAXIN) 500 MG tablet Take by mouth. 07/18/22 07/28/22  [provider]  Oxcarbazepine (TRILEPTAL) 300 MG tablet Take 1 tablet (300 mg total) by mouth 2 (two) times daily. 07/01/22 09/29/22  Vista Mink, MD  pantoprazole (PROTONIX) 40 MG tablet Take 1 tablet (40 mg total) by mouth daily. 05/17/21   Renee Rival, FNP  propranolol (INDERAL) 10 MG tablet Take 1 tablet (10 mg total) by mouth 2 (two) times daily as needed (anxiety). 06/03/22   Vista Mink, MD  traZODone (DESYREL) 100 MG tablet Take 1 tablet (100 mg total) by mouth at bedtime as needed for sleep (insomnia). 07/01/22   Vista Mink, MD  valACYclovir (VALTREX) 500 MG tablet Take 500 mg by mouth 2 (two) times daily.    [provider]    Family History Family History  Problem Relation Age of Onset   Alcohol abuse Mother    Stroke Mother    Bipolar disorder Mother    Drug abuse Mother    Alcohol abuse Father    Bipolar disorder Father    Drug abuse Father    Diabetes Father    Heart failure Father    Cancer Maternal Aunt        "stomach cancer"   Diabetes Maternal Aunt    Crohn's disease Paternal Aunt    Crohn's disease Paternal Aunt    Crohn's disease Paternal Uncle    Colon cancer Paternal Uncle    Breast cancer Maternal Grandmother    Colon cancer Maternal Grandfather    Diabetes type II Paternal Grandfather    Heart failure Paternal Grandfather    Celiac disease Neg Hx    Cervical cancer Neg Hx    Lung cancer Neg Hx     Social History Social History   Tobacco Use   Smoking status: Former    Packs/day: 0.50    Years: 5.00    Total pack years: 2.50    Types: Cigarettes    Quit date: 11/24/2019    Years since quitting: 2.6   Smokeless tobacco: Never   Tobacco comments:    one pack cigarettes daily  Vaping Use   Vaping Use: Every day   Start date: 08/26/2019  Substance Use Topics   Alcohol use: Yes    Comment: social   Drug use: Yes    Types: Marijuana    Comment: 3grams  a week, plans to quit, she is detoxing now using an herbal drink and staying away from smoking.     Allergies   Patient has no known allergies.   Review of Systems Review of Systems Per HPI  Physical  Exam Triage Vital Signs ED Triage Vitals  Enc Vitals Group     BP 07/24/22 1438 106/77     Pulse Rate 07/24/22 1438 74     Resp 07/24/22 1438 20     Temp 07/24/22 1438 98.5 F (36.9 C)  Temp Source 07/24/22 1438 Oral     SpO2 07/24/22 1438 98 %     Weight --      Height --      Head Circumference --      Peak Flow --      Pain Score 07/24/22 1440 10     Pain Loc --      Pain Edu? --      Excl. in [place]? --    No data found.  Updated Vital Signs BP 106/77 (BP Location: Right Arm)   Pulse 74   Temp 98.5 F (36.9 C) (Oral)   Resp 20   LMP 07/17/2022   SpO2 98%   Breastfeeding No   Visual Acuity Right Eye Distance:   Left Eye Distance:   Bilateral Distance:    Right Eye Near:   Left Eye Near:    Bilateral Near:     Physical Exam Vitals and nursing note reviewed.  Constitutional:      Appearance: Normal appearance. She is not ill-appearing.  HENT:     Head: Atraumatic.     Right Ear: Tympanic membrane normal.     Left Ear: Tympanic membrane normal.     Nose: Nose normal.     Mouth/Throat:     Mouth: Mucous membranes are moist.     Pharynx: Oropharyngeal exudate and posterior oropharyngeal erythema present.     Comments: Oral airway patent despite edematous and erythematous tonsils.  Uvula midline Eyes:     Extraocular Movements: Extraocular movements intact.     Conjunctiva/sclera: Conjunctivae normal.  Cardiovascular:     Rate and Rhythm: Normal rate and regular rhythm.     Heart sounds: Normal heart sounds.  Pulmonary:     Effort: Pulmonary effort is normal.     Breath sounds: Normal breath sounds.  Musculoskeletal:        General: Normal range of motion.     Cervical back: Normal range of motion and neck supple.  Lymphadenopathy:     Cervical: Cervical adenopathy present.  Skin:    General: Skin is warm and dry.  Neurological:     Mental Status: She is alert and oriented to person, place, and time.  Psychiatric:        Mood and Affect: Mood  normal.        Thought Content: Thought content normal.        Judgment: Judgment normal.      UC Treatments / Results  Labs (all labs ordered are listed, but only abnormal results are displayed) Labs Reviewed  POCT RAPID STREP A (OFFICE) - Abnormal; Notable for the following components:      Result Value   Rapid Strep A Screen Positive (*)    All other components within normal limits  POCT INFLUENZA A/B    EKG   Radiology No results found.  Procedures Procedures (including critical care time)  Medications Ordered in UC Medications  dexamethasone (DECADRON) injection 10 mg (10 mg Intramuscular Given 07/24/22 1511)    Initial Impression / Assessment and Plan / UC Course  I have reviewed the triage vital signs and the nursing notes.  Pertinent labs & imaging results that were available during my care of the patient were reviewed by me and considered in my medical decision making (see chart for details).     Vitals within normal limits today, rapid strep positive.  Given the extent of her symptoms will treat with IM Decadron, amoxicillin, viscous lidocaine, supportive over-the-counter medications  and home care.  Return for worsening symptoms.  Final Clinical Impressions(s) / UC Diagnoses   Final diagnoses:  Strep tonsillitis     Discharge Instructions      Take the full course of antibiotics even once you start feeling better.  Make sure to change her toothbrush after 2 days on the medication so that you do not reinfect yourself.  You are considered not contagious after 24 hours on the antibiotics.    ED Prescriptions     Medication Sig Dispense Auth. Provider   amoxicillin (AMOXIL) 875 MG tablet Take 1 tablet (875 mg total) by mouth 2 (two) times daily. 20 tablet Volney American, PA-C   lidocaine (XYLOCAINE) 2 % solution Use as directed 10 mLs in the mouth or throat every 3 (three) hours as needed for mouth pain. 100 mL Volney American, [place]       PDMP not reviewed this encounter.   Volney American, [place] 07/24/22 1544

## 2022-07-24 NOTE — Progress Notes (Signed)
Clinical Social Work Note  Virtual Visit via Video Note  I connected with Colleen Valentine on 07/24/22 at  8:00 AM EST by a video enabled telemedicine application and verified that I am speaking with the correct person using two identifiers.  Location: Patient: home  Provider: [facility] Office    I discussed the assessment and treatment plan with the patient. The patient was provided an opportunity to ask questions and all were answered. The patient agreed with the plan and demonstrated an understanding of the instructions.   The patient was advised to call back or seek an in-person evaluation if the symptoms worsen or if the condition fails to improve as anticipated.  I provided 3 minutes of non-face-to-face time during this encounter.   Patient was sick and waiting for call back from doctor, anticipated being sent to the hospital.  Next appointment with CSW and with Dr. were both confirmed.  Patient will call in to office is she needs to talk before then.  Maretta Los, LCSW   Selmer Dominion, LCSW 07/24/2022, 8:06 AM

## 2022-07-27 ENCOUNTER — Telehealth: Payer: Self-pay

## 2022-07-27 MED ORDER — FLUCONAZOLE 150 MG PO TABS: 150.0000 mg | ORAL_TABLET | Freq: Every day | ORAL | 1 refills | Status: DC

## 2022-07-29 ENCOUNTER — Telehealth: Payer: Medicaid Other | Admitting: Physician Assistant

## 2022-07-29 DIAGNOSIS — B379 Candidiasis, unspecified: Secondary | ICD-10-CM | POA: Diagnosis not present

## 2022-07-29 DIAGNOSIS — T3695XA Adverse effect of unspecified systemic antibiotic, initial encounter: Secondary | ICD-10-CM | POA: Diagnosis not present

## 2022-07-29 MED ORDER — FLUCONAZOLE 150 MG PO TABS: 150.0000 mg | ORAL_TABLET | ORAL | 0 refills | Status: DC | PRN

## 2022-07-29 NOTE — Progress Notes (Signed)
E-Visit for Vaginal Symptoms  We are sorry that you are not feeling well. Here is how we plan to help! Based on what you shared with me it looks like you: May have a yeast vaginosis  Vaginosis is an inflammation of the vagina that can result in discharge, itching and pain. The cause is usually a change in the normal balance of vaginal bacteria or an infection. Vaginosis can also result from reduced estrogen levels after menopause.  The most common causes of vaginosis are:   Bacterial vaginosis which results from an overgrowth of one on several organisms that are normally present in your vagina.   Yeast infections which are caused by a naturally occurring fungus called candida.   Vaginal atrophy (atrophic vaginosis) which results from the thinning of the vagina from reduced estrogen levels after menopause.   Trichomoniasis which is caused by a parasite and is commonly transmitted by sexual intercourse.  Factors that increase your risk of developing vaginosis include: Medications, such as antibiotics and steroids Uncontrolled diabetes Use of hygiene products such as bubble bath, vaginal spray or vaginal deodorant Douching Wearing damp or tight-fitting clothing Using an intrauterine device (IUD) for birth control Hormonal changes, such as those associated with pregnancy, birth control pills or menopause Sexual activity Having a sexually transmitted infection  Your treatment plan is Diflucan (fluconazole) '150mg'$  tablet once, repeat every 72 hours as needed.  I have electronically sent this prescription into the pharmacy that you have chosen. Can also use topical Monistat (Miconazole) cream for external irritation.  Be sure to take all of the medication as directed. Stop taking any medication if you develop a rash, tongue swelling or shortness of breath. Mothers who are breast feeding should consider pumping and discarding their breast milk while on these antibiotics. However, there is no  consensus that infant exposure at these doses would be harmful.  Remember that medication creams can weaken latex condoms. Marland Kitchen   HOME CARE:  Good hygiene may prevent some types of vaginosis from recurring and may relieve some symptoms:  Avoid baths, hot tubs and whirlpool spas. Rinse soap from your outer genital area after a shower, and dry the area well to prevent irritation. Don't use scented or harsh soaps, such as those with deodorant or antibacterial action. Avoid irritants. These include scented tampons and pads. Wipe from front to back after using the toilet. Doing so avoids spreading fecal bacteria to your vagina.  Other things that may help prevent vaginosis include:  Don't douche. Your vagina doesn't require cleansing other than normal bathing. Repetitive douching disrupts the normal organisms that reside in the vagina and can actually increase your risk of vaginal infection. Douching won't clear up a vaginal infection. Use a latex condom. Both female and female latex condoms may help you avoid infections spread by sexual contact. Wear cotton underwear. Also wear pantyhose with a cotton crotch. If you feel comfortable without it, skip wearing underwear to bed. Yeast thrives in [employer] Your symptoms should improve in the next day or two.  GET HELP RIGHT AWAY IF:  You have pain in your lower abdomen ( pelvic area or over your ovaries) You develop nausea or vomiting You develop a fever Your discharge changes or worsens You have persistent pain with intercourse You develop shortness of breath, a rapid pulse, or you faint.  These symptoms could be signs of problems or infections that need to be evaluated by a medical provider now.  MAKE SURE YOU   Understand these  instructions. Will watch your condition. Will get help right away if you are not doing well or get worse.  Thank you for choosing an e-visit.  Your e-visit answers were reviewed by a board certified  advanced clinical practitioner to complete your personal care plan. Depending upon the condition, your plan could have included both over the counter or prescription medications.  Please review your pharmacy choice. Make sure the pharmacy is open so you can pick up prescription now. If there is a problem, you may contact your provider through [employer] and have the prescription routed to another pharmacy.  Your safety is important to [place]. If you have drug allergies check your prescription carefully.   For the next 24 hours you can use MyChart to ask questions about today's visit, request a non-urgent call back, or ask for a work or school excuse. You will get an email in the next two days asking about your experience. I hope that your e-visit has been valuable and will speed your recovery.  I have spent 5 minutes in review of e-visit questionnaire, review and updating patient chart, medical decision making and response to patient.   Mar Daring, PA-C

## 2022-07-31 ENCOUNTER — Telehealth (INDEPENDENT_AMBULATORY_CARE_PROVIDER_SITE_OTHER): Payer: No Typology Code available for payment source | Admitting: Clinical

## 2022-07-31 ENCOUNTER — Encounter (HOSPITAL_COMMUNITY): Payer: Self-pay | Admitting: Clinical

## 2022-07-31 DIAGNOSIS — F330 Major depressive disorder, recurrent, mild: Secondary | ICD-10-CM

## 2022-07-31 DIAGNOSIS — Z8659 Personal history of other mental and behavioral disorders: Secondary | ICD-10-CM | POA: Diagnosis not present

## 2022-07-31 DIAGNOSIS — F1210 Cannabis abuse, uncomplicated: Secondary | ICD-10-CM

## 2022-07-31 DIAGNOSIS — F603 Borderline personality disorder: Secondary | ICD-10-CM | POA: Diagnosis not present
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
## 2022-08-02 ENCOUNTER — Other Ambulatory Visit (HOSPITAL_COMMUNITY): Payer: Self-pay | Admitting: Psychiatry

## 2022-08-02 DIAGNOSIS — F603 Borderline personality disorder: Secondary | ICD-10-CM

## 2022-08-07 ENCOUNTER — Ambulatory Visit (INDEPENDENT_AMBULATORY_CARE_PROVIDER_SITE_OTHER): Payer: Medicaid Other | Admitting: Adult Health

## 2022-08-07 ENCOUNTER — Other Ambulatory Visit (HOSPITAL_COMMUNITY)
Admission: RE | Admit: 2022-08-07 | Discharge: 2022-08-07 | Disposition: A | Discharge status: Home / Self Care | Payer: Medicaid Other | Source: Ambulatory Visit | Attending: Adult Health | Admitting: Adult Health

## 2022-08-07 ENCOUNTER — Encounter: Payer: Self-pay | Admitting: Adult Health

## 2022-08-07 VITALS — BP 118/85 | HR 96 | Ht 65.0 in | Wt 256.0 lb

## 2022-08-07 DIAGNOSIS — K649 Unspecified hemorrhoids: Secondary | ICD-10-CM | POA: Insufficient documentation

## 2022-08-07 DIAGNOSIS — B379 Candidiasis, unspecified: Secondary | ICD-10-CM | POA: Insufficient documentation

## 2022-08-07 MED ORDER — FLUCONAZOLE 150 MG PO TABS: ORAL_TABLET | ORAL | 1 refills | Status: DC

## 2022-08-07 NOTE — Progress Notes (Signed)
  Subjective:     Patient ID: Lilyanah Celestin, female   DOB: 07/21/1995, 27 y.o.   MRN: [id number]  HPI Shaana is a 27 year old white female, single, [id number] in complaining of recurrent yeast, and hemorrhoids. Has heavy periods, but may want to carry baby for her partner, they will talk more, may want IUD or tubal in future.   Last pap was negative HPV, NILM 09/06/21.  PCP is Kayren Eaves NP  Review of Systems Recurrent yeast +hemorrhoids  Periods heavy   Reviewed past medical,surgical, social and family history. Reviewed medications and allergies.  Objective:   Physical Exam BP 118/85 (BP Location: Right Arm, Patient Position: Sitting, Cuff Size: Normal)   Pulse 96   Ht 5\' 5"  (1.651 m)   Wt 256 lb (116.1 kg)   LMP 08/05/2022   Breastfeeding No   BMI 42.60 kg/m     Skin warm and dry.Pelvic: external genitalia is normal in appearance no lesions, vagina: white discharge without odor,urethra has no lesions or masses noted, cervix:smooth and bulbous, uterus: normal size, shape and contour, non tender, no masses felt, adnexa: no masses or tenderness noted. Bladder is non tender and no masses felt.CV swab obtained. On rectal exam has external hemorrhoid and internal hemorrhoid    Upstream - 08/07/22 1505       Pregnancy Intention Screening   Does the patient want to become pregnant in the next year? No    Does the patient's partner want to become pregnant in the next year? No    Would the patient like to discuss contraceptive options today? No      Contraception Wrap Up   Current Method No Method - Other Reason   female partner   End Method No Method - Other Reason    Contraception Counseling Provided No    How was the end contraceptive method provided? N/A             Examination chaperoned by Levy Pupa LPN  Assessment:     1. Hemorrhoids, unspecified hemorrhoid type Has external and internal hemorrhoids Try preparation H Discussed surgical consult, declines for now   2.  Yeast infection CV swab sent for BV and yeast Will rx diflucan Meds ordered this encounter  Medications   fluconazole (DIFLUCAN) 150 MG tablet    Sig: Take 1 now and 1 in 3 days if needed    Dispense:  2 tablet    Refill:  1    Order Specific Question:   Supervising Provider    Answer:   Florian Buff [[id number]]       Plan:     Follow up prn

## 2022-08-09 ENCOUNTER — Other Ambulatory Visit: Payer: Self-pay | Admitting: Adult Health

## 2022-08-09 ENCOUNTER — Ambulatory Visit (HOSPITAL_COMMUNITY): Payer: Medicaid Other

## 2022-08-09 LAB — CERVICOVAGINAL ANCILLARY ONLY
Bacterial Vaginitis (gardnerella): POSITIVE — AB
Candida Glabrata: NEGATIVE
Candida Vaginitis: NEGATIVE
Comment: NEGATIVE
Comment: NEGATIVE
Comment: NEGATIVE

## 2022-08-09 MED ORDER — METRONIDAZOLE 500 MG PO TABS: 500.0000 mg | ORAL_TABLET | Freq: Two times a day (BID) | ORAL | 0 refills | Status: DC

## 2022-08-12 ENCOUNTER — Encounter (HOSPITAL_COMMUNITY): Payer: No Typology Code available for payment source | Admitting: Psychiatry

## 2022-08-12 ENCOUNTER — Encounter (HOSPITAL_COMMUNITY): Payer: Self-pay

## 2022-08-12 NOTE — Progress Notes (Signed)
This encounter was created in error - please disregard.

## 2022-08-15 ENCOUNTER — Other Ambulatory Visit: Payer: Self-pay | Admitting: Adult Health

## 2022-08-15 ENCOUNTER — Ambulatory Visit: Payer: Self-pay | Admitting: Orthopaedic Surgery

## 2022-08-16 ENCOUNTER — Ambulatory Visit (HOSPITAL_COMMUNITY): Payer: Medicaid Other

## 2022-08-19 ENCOUNTER — Ambulatory Visit: Payer: Medicaid Other | Admitting: Obstetrics & Gynecology

## 2022-08-28 ENCOUNTER — Ambulatory Visit: Payer: Medicaid Other | Admitting: Obstetrics & Gynecology

## 2022-08-29 ENCOUNTER — Ambulatory Visit: Payer: Medicaid Other | Admitting: Orthopaedic Surgery

## 2022-09-02 ENCOUNTER — Encounter (HOSPITAL_COMMUNITY): Payer: Self-pay | Admitting: Clinical

## 2022-09-02 ENCOUNTER — Ambulatory Visit (INDEPENDENT_AMBULATORY_CARE_PROVIDER_SITE_OTHER): Payer: No Typology Code available for payment source | Admitting: Clinical

## 2022-09-02 DIAGNOSIS — F1210 Cannabis abuse, uncomplicated: Secondary | ICD-10-CM | POA: Diagnosis not present

## 2022-09-02 DIAGNOSIS — F603 Borderline personality disorder: Secondary | ICD-10-CM

## 2022-09-02 DIAGNOSIS — Z8659 Personal history of other mental and behavioral disorders: Secondary | ICD-10-CM | POA: Diagnosis not present

## 2022-09-02 DIAGNOSIS — F330 Major depressive disorder, recurrent, mild: Secondary | ICD-10-CM
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
## 2022-09-04 ENCOUNTER — Other Ambulatory Visit (HOSPITAL_COMMUNITY): Payer: Self-pay | Admitting: Psychiatry

## 2022-09-04 DIAGNOSIS — F603 Borderline personality disorder: Secondary | ICD-10-CM

## 2022-09-05 ENCOUNTER — Ambulatory Visit: Payer: Medicaid Other | Admitting: Orthopaedic Surgery

## 2022-09-09 ENCOUNTER — Ambulatory Visit (INDEPENDENT_AMBULATORY_CARE_PROVIDER_SITE_OTHER): Payer: Self-pay | Admitting: Clinical

## 2022-09-09 DIAGNOSIS — Z91199 Patient's noncompliance with other medical treatment and regimen due to unspecified reason: Secondary | ICD-10-CM

## 2022-09-09 NOTE — Progress Notes (Signed)
Entered by mistake.  Ambrose Mantle, LCSW 09/09/2022, 1:31 PM

## 2022-09-09 NOTE — Progress Notes (Addendum)
Therapy Progress Note  Patient had an appointment scheduled with therapist on 09/09/2022  at 1:00pm.  When she did not arrive into the virtual session, CSW sent her a text to [phone]  at 1:03pm, an email to [email or web address] at 1:07pm, and a second text at 1:09pm.  She still did not arrive for the session by 1:29pm, so was a no show.  As per [facility] policy, she will be be charged for this no-show appointment.    Encounter Diagnosis  Name Primary?   No-show for appointment Yes     Ambrose Mantle, LCSW 09/09/2022, 1:28 PM

## 2022-09-09 NOTE — Progress Notes (Signed)
Entered by mistake.  Tamyka Bezio Grossman-Orr, LCSW 09/09/2022, 1:31 PM  

## 2022-09-11 ENCOUNTER — Ambulatory Visit: Payer: Medicaid Other | Admitting: Obstetrics & Gynecology

## 2022-09-12 ENCOUNTER — Ambulatory Visit (INDEPENDENT_AMBULATORY_CARE_PROVIDER_SITE_OTHER): Payer: Medicaid Other | Admitting: Obstetrics & Gynecology

## 2022-09-12 ENCOUNTER — Encounter: Payer: Self-pay | Admitting: Obstetrics & Gynecology

## 2022-09-12 VITALS — BP 103/79 | HR 67 | Ht 65.0 in | Wt 254.6 lb

## 2022-09-12 DIAGNOSIS — Z30430 Encounter for insertion of intrauterine contraceptive device: Secondary | ICD-10-CM | POA: Diagnosis not present

## 2022-09-12 DIAGNOSIS — Z3202 Encounter for pregnancy test, result negative: Secondary | ICD-10-CM | POA: Diagnosis not present

## 2022-09-12 DIAGNOSIS — Z3009 Encounter for other general counseling and advice on contraception: Secondary | ICD-10-CM

## 2022-09-12 LAB — POCT URINE PREGNANCY: Preg Test, Ur: NEGATIVE

## 2022-09-12 MED ORDER — LEVONORGESTREL 20 MCG/DAY IU IUD
1.0000 | INTRAUTERINE_SYSTEM | Freq: Once | INTRAUTERINE | Status: AC
  Administered 2022-09-12: 1 via INTRAUTERINE

## 2022-09-12 NOTE — Progress Notes (Signed)
GYN VISIT Patient name: Caren Garske MRN [id number]  Date of birth: [birth year] Chief Complaint:   Pre-op Exam (Discuss BTL)  History of Present Illness:   Narcissus Detwiler is a 27 y.o. [phone] female being seen today for contraceptive management.  With her last pregnancy she had an epidural abscess complicated by PPROM and early delivery at 34 weeks via C-section due to fetal malpresentation.  She does not want to experience that ever again and at this time does not want a future pregnancy.  Menses are every month- around the same time.  Last for 7 days- Typically 6-7 days of heavy bleeding.  Dysmenorrhea taking OTC medication with only minimal improvement.  She is not currently on anything to regulate her period but is interested in her options.  In the past she has tried pills, Nexplanon and patient reports IUD placed when she was 16.  She reports no acute GYN concerns.  Denies vaginal discharge itching or irritation.  Denies pelvic or abdominal pain.  Currently sexually active with same female partner  No LMP recorded.     06/03/2022    5:09 PM 02/12/2022    9:36 AM 01/03/2022    2:16 PM 09/06/2021    1:36 PM 05/17/2021    3:20 PM  Depression screen PHQ 2/9  Decreased Interest  Down, Depressed, Hopeless  PHQ - 2 Score  Altered sleeping  Tired, decreased energy  Change in appetite  Feeling bad or failure about yourself   0 Trouble concentrating  Moving slowly or fidgety/restless  Suicidal thoughts  0 1 0 1  PHQ-9 Score  Difficult doing work/chores  Very difficult Extremely dIfficult  Extremely dIfficult     Information is confidential and restricted. Go to Review Flowsheets to unlock data.     Review of Systems:   Pertinent items are noted in HPI Denies fever/chills, dizziness, headaches, visual disturbances, fatigue, shortness of breath, chest pain, abdominal pain, vomiting, no  problems with bowel movements, urination, or intercourse unless otherwise stated above.  Pertinent History Reviewed:  Reviewed past medical,surgical, social, obstetrical and family history.  Reviewed problem list, medications and allergies. Physical Assessment:   Vitals:   09/12/22 0843  BP: 103/79  Pulse: 67  Weight: 254 lb 9.6 oz (115.5 kg)  Height:  (1.651 m)  Body mass index is 42.37 kg/m.       Physical Examination:   General appearance: alert, well appearing, and in no distress  Psych: mood appropriate, normal affect  Skin: warm & dry   Cardiovascular: normal heart rate noted  Respiratory: normal respiratory effort, no distress  Abdomen: soft, non-tender   Pelvic: VULVA: normal appearing vulva with no masses, tenderness or lesions, VAGINA: normal appearing vagina with normal color and discharge, no lesions, CERVIX: normal appearing cervix without discharge or lesions, UTERUS: uterus is normal size, shape, consistency and nontender  Extremities: no edema   Chaperone: Faith Rogue    IUD INSERTION   The risks and benefits of the method and placement have been thouroughly reviewed with the patient and all questions were answered.  Specifically the patient is aware of failure rate of 05/998, expulsion of the IUD and of possible perforation.  The patient is aware  of irregular bleeding due to the method and understands the incidence of irregular bleeding diminishes with time.  Signed copy of informed consent in chart.    A sterile speculum was placed in the vagina.  The cervix was visualized, prepped using Betadine, and grasped with a single tooth tenaculum. The uterus was sounded to 8 cm.  Mirena  IUD placed per manufacturer's recommendations. The strings were trimmed to approximately 3 cm. The patient tolerated the procedure well.    Chaperone: Faith Rogue    Assessment & Plan:   -Mirena IUD insertion      Assessment & Plan:  1) Contraceptive  management -reviewed all contraceptive options including pills, patch, ring, Depot, LARCs -Discussed sterilization.  Reviewed that most common "side effect "for women under the age of 39 is regret.  Discussed the permanence of the procedure. -risk/benefit and potential side effects of each were reviewed and after much discussion patient desires Mirena  The patient was given post procedure instructions, including signs and symptoms of infection and to check for the strings after each menses or each month, and refraining from intercourse or anything in the vagina for 3 days. She was given a care card with date IUD placed, and date IUD to be removed. She is scheduled for a f/u appointment in  6- 8 weeks.   Orders Placed This Encounter  Procedures   POCT urine pregnancy    Return for 8wk IUD check.   Myna Hidalgo, DO Attending Obstetrician & Gynecologist, [facility] for [employer], [facility]

## 2022-09-16 ENCOUNTER — Encounter (HOSPITAL_COMMUNITY): Payer: Self-pay | Admitting: Clinical

## 2022-09-16 ENCOUNTER — Ambulatory Visit (HOSPITAL_COMMUNITY): Payer: No Typology Code available for payment source | Admitting: Clinical

## 2022-09-16 DIAGNOSIS — F603 Borderline personality disorder: Secondary | ICD-10-CM | POA: Diagnosis not present

## 2022-09-16 DIAGNOSIS — F1210 Cannabis abuse, uncomplicated: Secondary | ICD-10-CM

## 2022-09-16 DIAGNOSIS — Z8659 Personal history of other mental and behavioral disorders: Secondary | ICD-10-CM | POA: Diagnosis not present

## 2022-09-16 DIAGNOSIS — F330 Major depressive disorder, recurrent, mild: Secondary | ICD-10-CM
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
## 2022-09-23 ENCOUNTER — Ambulatory Visit (INDEPENDENT_AMBULATORY_CARE_PROVIDER_SITE_OTHER): Payer: Self-pay | Admitting: Clinical

## 2022-09-23 DIAGNOSIS — F1210 Cannabis abuse, uncomplicated: Secondary | ICD-10-CM

## 2022-09-23 DIAGNOSIS — Z8659 Personal history of other mental and behavioral disorders: Secondary | ICD-10-CM

## 2022-09-23 DIAGNOSIS — F330 Major depressive disorder, recurrent, mild: Secondary | ICD-10-CM

## 2022-09-23 DIAGNOSIS — Z91199 Patient's noncompliance with other medical treatment and regimen due to unspecified reason: Secondary | ICD-10-CM

## 2022-09-23 DIAGNOSIS — F603 Borderline personality disorder: Secondary | ICD-10-CM

## 2022-09-23 NOTE — Progress Notes (Signed)
Therapy Progress Note  Patient had an appointment scheduled with therapist on 09/23/2022  at 1:00pm.  When she did not arrive into the virtual session, CSW sent her a text to [phone] at 1:07pm, an email to [email or web address]  at 1:10pm, and a second text at 1:12pm.  She still did not arrive for the session at 1:15pm and called the front office to inform she was driving to the beach, so was considered a no show.   Encounter Diagnoses  Name Primary?   Borderline personality disorder (HCC)    Mild episode of recurrent major depressive disorder (HCC)    Cannabis use disorder, mild, abuse    History of posttraumatic stress disorder (PTSD)    No-show for appointment Yes     Ambrose Mantle, LCSW 09/23/2022, 1:15 PM

## 2022-10-13 ENCOUNTER — Other Ambulatory Visit (HOSPITAL_COMMUNITY): Payer: Self-pay | Admitting: Psychiatry

## 2022-10-13 DIAGNOSIS — F603 Borderline personality disorder: Secondary | ICD-10-CM

## 2022-10-16 ENCOUNTER — Telehealth (INDEPENDENT_AMBULATORY_CARE_PROVIDER_SITE_OTHER): Payer: No Typology Code available for payment source | Admitting: Clinical

## 2022-10-16 ENCOUNTER — Encounter (HOSPITAL_COMMUNITY): Payer: Self-pay | Admitting: Clinical

## 2022-10-16 DIAGNOSIS — F603 Borderline personality disorder: Secondary | ICD-10-CM

## 2022-10-16 DIAGNOSIS — F1290 Cannabis use, unspecified, uncomplicated: Secondary | ICD-10-CM | POA: Diagnosis not present

## 2022-10-16 DIAGNOSIS — Z8659 Personal history of other mental and behavioral disorders: Secondary | ICD-10-CM

## 2022-10-16 DIAGNOSIS — F330 Major depressive disorder, recurrent, mild: Secondary | ICD-10-CM | POA: Diagnosis not present

## 2022-10-16 DIAGNOSIS — F1210 Cannabis abuse, uncomplicated: Secondary | ICD-10-CM
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
## 2022-10-23 ENCOUNTER — Encounter: Payer: Self-pay | Admitting: Nurse Practitioner

## 2022-10-23 ENCOUNTER — Ambulatory Visit (INDEPENDENT_AMBULATORY_CARE_PROVIDER_SITE_OTHER): Payer: Medicaid Other | Admitting: Nurse Practitioner

## 2022-10-23 ENCOUNTER — Telehealth (HOSPITAL_COMMUNITY): Payer: Self-pay | Admitting: *Deleted

## 2022-10-23 VITALS — BP 124/88 | HR 80 | Temp 97.6°F | Ht 65.0 in | Wt 253.0 lb

## 2022-10-23 DIAGNOSIS — F411 Generalized anxiety disorder: Secondary | ICD-10-CM | POA: Diagnosis not present

## 2022-10-23 DIAGNOSIS — F603 Borderline personality disorder: Secondary | ICD-10-CM | POA: Insufficient documentation

## 2022-10-23 DIAGNOSIS — R112 Nausea with vomiting, unspecified: Secondary | ICD-10-CM | POA: Diagnosis not present

## 2022-10-23 DIAGNOSIS — S31135A Puncture wound of abdominal wall without foreign body, periumbilic region without penetration into peritoneal cavity, initial encounter: Secondary | ICD-10-CM

## 2022-10-23 DIAGNOSIS — F1210 Cannabis abuse, uncomplicated: Secondary | ICD-10-CM | POA: Diagnosis not present

## 2022-10-23 DIAGNOSIS — L089 Local infection of the skin and subcutaneous tissue, unspecified: Secondary | ICD-10-CM | POA: Insufficient documentation

## 2022-10-23 MED ORDER — ONDANSETRON 4 MG PO TBDP
4.0000 mg | ORAL_TABLET | Freq: Three times a day (TID) | ORAL | 1 refills | Status: AC | PRN
Start: 2022-10-23 — End: ?

## 2022-10-23 NOTE — Patient Instructions (Signed)
Restart Propranolol 10 mg 1-tab BID okay to take a third dose if needed

## 2022-10-23 NOTE — Telephone Encounter (Signed)
Writer spoke with pt who called with request to speak with you. Pt has c/o severely increased anxiety and irritability. Pt also c/o "memory loss; short term" and decreased concentration stating that she is a very intellectual and articulate person but isn't feeling like that recently. Pt denied any SI or thoughts of self harm. Pt says that the Inderal is not working for her. Pt now has an appointment in place for 10/29/22. Pt stated that she does not want to go to [facility] or [facility]. Please review and advise.

## 2022-10-23 NOTE — Telephone Encounter (Signed)
Writer LVM advising pt of provider response. Pt encouraged to go to [facility], address and phone # provided, if she goes into crisis prior to her appointment on 10/29/22.

## 2022-10-23 NOTE — Progress Notes (Signed)
Acute Office Visit  Subjective:     Patient ID: Colleen Valentine, female    DOB: Feb 09, 1996, 27 y.o.   MRN: [id number]  Chief Complaint  Patient presents with   Anxiety    Has been worse for the past 3 weeks. Has physiatrist appt June 4 but felt like she needed to be seen sooner.    Emesis    For past 2 weeks anytime pt anytime she eats vomits. Thinks it is related to nerves.    belly button ring infection    Possible piercing infection?    Memory Loss    Hard time remembering things.     HPI  Colleen Valentine, female 28 year old female with PMH of Cannibalis use disorder, here today for an acute for persistent emesis for the last 3-weeks. Increased anxiety, and concerns for infection,  Current report that she had a bellybutton piece 3 days ago at a local tattoo shop and has been experiencing pain and purulent discharge and redness at the site.  Report of 3 out of 10 pain described the pain as dull.  Piercing site is warm to touch, erythematous with purulent drainage.  Will order culture and await for culture results for possible treatment.  Since client has been on multiple antibiotics with in the last 2 month.  She reports dealing with nausea and vomiting for 3 weeks on and off.THC 1-3-joint  at bedtime, been smoking since she was 27 yrs old " I used to smoke 6-7 joints daily trying to cut down".  Explained to client that as she is sick and because cyclic vomiting "my therapist told me about that that is why I am trying to stop smoking".  Client reported that she has been dealing with increased anxiety and felt like her mental health medication is not controlling her symptoms.  She she is prescribed propranolol for anxiety 10 mg 3 times a day as needed.  She reports that she has stopped taking it for 6 weeks now because she does not feel like it is helping her.  When asked client how she has been taking the medication, she reports that maybe once or twice a day if I feel anxious.  She has an  upcoming appointment with psychiatry next week on the fourth.  Advised her to resume propranolol twice daily as needed as it was prescribed and it is okay to take a third dose if needed.  She denies suicidal homicidal ideation also reported that mood has improved with trazodone and Trileptal Vape 1-cartridge week   ROS Negative unless indicated in HPI    Objective:    BP 124/88 (Patient Position: Lying left side, Cuff Size: Large) Comment: mannual  Pulse 80   Temp 97.6 F (36.4 C) (Temporal)   Ht 5\' 5"  (1.651 m)   Wt 253 lb (114.8 kg)   LMP 09/25/2022 (Approximate) Comment: "has IUD"  SpO2 98%   BMI 42.10 kg/m  Wt Readings from Last 3 Encounters:  10/23/22 253 lb (114.8 kg)  09/12/22 254 lb 9.6 oz (115.5 kg)  08/07/22 256 lb (116.1 kg)      Physical Exam Vitals and nursing note reviewed.  Constitutional:      General: She is not in acute distress.    Appearance: Normal appearance. She is overweight.  HENT:     Head: Normocephalic and atraumatic.  Eyes:     General: No scleral icterus.    Extraocular Movements: Extraocular movements intact.     Pupils: Pupils are equal,  round, and reactive to light.  Cardiovascular:     Rate and Rhythm: Normal rate.     Pulses: Normal pulses.  Pulmonary:     Effort: Pulmonary effort is normal.     Breath sounds: Normal breath sounds.  Abdominal:     General: Bowel sounds are normal.     Palpations: Abdomen is soft. There is no mass.  Skin:    General: Skin is warm and dry.     Capillary Refill: Capillary refill takes less than 2 seconds.     Findings: No rash.  Neurological:     General: No focal deficit present.     Mental Status: She is alert and oriented to person, place, and time. Mental status is at baseline.  Psychiatric:        Attention and Perception: Attention and perception normal.        Mood and Affect: Affect normal. Mood is anxious.        Speech: Speech normal.        Behavior: Behavior normal. Behavior is  cooperative.        Thought Content: Thought content normal. Thought content does not include homicidal or suicidal ideation. Thought content does not include homicidal or suicidal plan.        Cognition and Memory: Cognition and memory normal.        Judgment: Judgment normal.     No results found for any visits on 10/23/22.      Assessment & Plan:  Pierced belly button infection -     WOUND CULTURE -     Anaerobic and Aerobic Culture  Anxiety state  Nausea and vomiting, unspecified vomiting type -     Ondansetron; Take 1 tablet (4 mg total) by mouth every 8 (eight) hours as needed for nausea or vomiting.  Dispense: 30 tablet; Refill: 1  Marijuana abuse  Borderline personality disorder (HCC)  Assessment/plan Nausea and vomiting due to [facility] use Take Zofran 4 mg every 3 hours as needed  Borderline personality disorder Continue trazodone 100 mg 1 to 2 tablets as needed as needed at bedtime Trileptal 300 mg 1 tablet twice daily  Anxiety state Restart propranolol 10 mg twice daily as needed okay to take a third dose if still feeling anxious Follow-up with psychiatry as already scheduled on June 4  Patient bellybutton infection Culture collected and sent to the lab will wait for culture results to come back before treated Use septic soap dry totally and apply Neosporin  Plan of care discussed with the client Follow-up as scheduled or sooner if needed Return for follow-up as already schedule with PCP.  [address] [place], [place]

## 2022-10-24 ENCOUNTER — Ambulatory Visit: Payer: Medicaid Other

## 2022-10-24 LAB — WOUND CULTURE

## 2022-10-25 LAB — WOUND CULTURE

## 2022-10-27 LAB — WOUND CULTURE: Organism ID, Bacteria: NONE SEEN

## 2022-10-28 ENCOUNTER — Encounter (HOSPITAL_COMMUNITY): Payer: Self-pay | Admitting: Clinical

## 2022-10-28 ENCOUNTER — Telehealth (HOSPITAL_COMMUNITY): Payer: No Typology Code available for payment source | Admitting: Clinical

## 2022-10-28 DIAGNOSIS — F1210 Cannabis abuse, uncomplicated: Secondary | ICD-10-CM | POA: Diagnosis not present

## 2022-10-28 DIAGNOSIS — F330 Major depressive disorder, recurrent, mild: Secondary | ICD-10-CM

## 2022-10-28 DIAGNOSIS — Z8659 Personal history of other mental and behavioral disorders: Secondary | ICD-10-CM | POA: Diagnosis not present

## 2022-10-28 DIAGNOSIS — F603 Borderline personality disorder: Secondary | ICD-10-CM
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
## 2022-10-29 ENCOUNTER — Telehealth (HOSPITAL_COMMUNITY): Payer: No Typology Code available for payment source | Admitting: Psychiatry

## 2022-10-29 ENCOUNTER — Encounter (HOSPITAL_COMMUNITY): Payer: Self-pay | Admitting: Psychiatry

## 2022-10-29 DIAGNOSIS — F603 Borderline personality disorder: Secondary | ICD-10-CM | POA: Diagnosis not present

## 2022-10-29 DIAGNOSIS — F330 Major depressive disorder, recurrent, mild: Secondary | ICD-10-CM

## 2022-10-29 MED ORDER — VORTIOXETINE HBR 5 MG PO TABS
5.00 mg | ORAL_TABLET | Freq: Every day | ORAL | 1 refills | Status: DC
Start: 2022-10-29 — End: 2022-11-29

## 2022-10-29 MED ORDER — OXCARBAZEPINE 300 MG PO TABS
300.0000 mg | ORAL_TABLET | Freq: Two times a day (BID) | ORAL | 1 refills | Status: DC
Start: 2022-10-29 — End: 2022-11-29

## 2022-10-29 MED ORDER — TRAZODONE HCL 100 MG PO TABS
100.0000 mg | ORAL_TABLET | Freq: Every evening | ORAL | 1 refills | Status: DC | PRN
Start: 2022-10-29 — End: 2023-01-03

## 2022-10-29 MED ORDER — HYDROXYZINE HCL 10 MG PO TABS
10.0000 mg | ORAL_TABLET | Freq: Three times a day (TID) | ORAL | 1 refills | Status: DC | PRN
Start: 2022-10-29 — End: 2022-11-29

## 2022-10-29 NOTE — Progress Notes (Signed)
BH MD/PA/NP OP Progress Note  10/29/2022 5:26 PM Colleen Valentine  MRN:  [id number]  Visit Diagnosis:    ICD-10-CM   1. Mild episode of recurrent major depressive disorder (HCC)  F33.0 vortioxetine HBr (TRINTELLIX) 5 MG TABS tablet    traZODone (DESYREL) 100 MG tablet    2. Borderline personality disorder (HCC)  F60.3 vortioxetine HBr (TRINTELLIX) 5 MG TABS tablet    traZODone (DESYREL) 100 MG tablet    Oxcarbazepine (TRILEPTAL) 300 MG tablet    hydrOXYzine (ATARAX) 10 MG tablet       Assessment: Colleen Valentine is a 27 y.o. female with a history of oppositional defiant disorder, ADHD, anxiety, depression, borderline personality disorder, PTSD, and marijuana use disorder. who presented to [facility] for initial evaluation on 06/03/2022.    During initial evaluation patient reported symptoms of mood lability including depressed mood, irritability, anxiety, and elevated mood.  She also endorsed symptoms of impulsive behaviors, chronic feelings of emptiness, emotional instability, real or perceived fear of abandonment, dissociative symptoms, and a past history of trauma.  Of note patient did endorse a history of nonsuicidal self-injury and poor sense of self that had improved after connecting with CBT/DBT groups.  Based on patient's symptoms and her past history of borderline personality disorder it does appear she is having reoccurrence of the symptoms and would benefit from restarting on medications and reconnecting with therapy/groups.  Colleen Valentine presents for follow-up evaluation. Today, 10/29/22, patient reports an improvement in mood lability and insomnia over the past several months.  She has had increased anxiety with excessive worry and racing thoughts for the past month or 2.  Patient has been attending therapy which has helped helped her to develop coping skills and structure into her day.  In regards to medications patient reports benefit from Trileptal and  denies adverse side effects.  She has not noticed much benefit from the propranolol however despite increasing it to 3 times a day as needed.  We will discontinue propranolol today and start hydroxyzine 25 mg 3 times a day as needed in its place.  We will also start patient on Trintellix 5 mg daily.  Risk and benefits of both were discussed.  Patient will follow-up in a month and continue with therapy.  Plan: - Continue Trileptal 300 mg BID - Start Trintellix 5 mg QD - Start hydroxyzine 25 mg TID prn for anxiety - Continue trazodone 100 QHS prn for insomnia - Discontinue Propranolol 10 mg BID prn anxiety - Continue therapy with [name] for therapy.  [facility] information provided - CMP, CBC, iron panel, Vit B12, TSH, A1c reviewed - EKG from 02/12/22 reviewed QTC 398 - Crisis resources reviewed - Follow up in 4-6 weeks   Chief Complaint:  Chief Complaint  Patient presents with   Follow-up   HPI: In the interim patient reached out c/o severely increased anxiety and irritability. Pt also c/o "memory loss; short term" and decreased concentration. Pt denied any SI or thoughts of self harm. Pt stated that the Inderal is not working for her.    On presentation today patient reports that there is been a number of stressors that have come up over the past few months however connect with therapy has been particularly helpful.  She has been able to identify a number of cognitive distortions through this and has worked towards managing them. 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 Patient also notes that she continues to take the Trileptal with good benefit noting that that her mood has remained stable without any adverse side effects.  Recently patient has felt that her anxiety has started to get worse.  She describes feelings of constant worry about multiple things that can progressed to  the point of nausea and diarrhea.  One of the triggers could be when her routines messed up and things get out of control which can lead to her feeling anxious the rest of the day.  She had been taking propranolol without much benefit and connected with her PCP in the interim recommend increasing to 3 times a day.  She has did this however still reports no improvement in her anxiety symptoms.  We discussed treatment for anxiety including long-term coverage and as needed medications.  Patient was open to both options.  We agreed to start him pelvics and reviewed the risk and benefits.  We also will start her on hydroxyzine which patient has tried in the past.  She was unable to remember the effects of the medication in the past and was open to retrying.  Past Psychiatric History: She has a past psychiatric history of oppositional defiant disorder, ADHD, anxiety, depression, borderline personality disorder, PTSD, Schizoaffective disorder, and marijuana use disorder.  At 16 she was in psychiatric residential facility following a suicide attempt after her mom passed. Learned CBT and DBT there that she carried with her and had helped since.  Most notably the cognitive re framing.  She has seen a therapist intermittently in the past  Medications- Pristiq, Zoloft, Paxil, Prozac, Lexapro, Wellbutrin, Cymbalta (palpitations), BuSpar, trazodone, lamictal, seroquel, hydroxyzine, ativan, and gabapentin.  She endorses daily marijuana want to use to help with anxiety symptoms.  She does note that it negatively impacts her concentration and increases her appetite.  Risk of marijuana use were discussed and adverse effects on anxiety were explained. Alcohol social use of one drink every couple weeks Cocaine use as a teenager   Past Medical History:  Past Medical History:  Diagnosis Date   Acute pyelonephritis 05/17/2018   Anemia    Anxiety    Complication of anesthesia    Depression    GERD (gastroesophageal  reflux disease)    Herpes genitalia    Insomnia    Obesity    Polysubstance abuse (HCC) 04/01/2011   PONV (postoperative nausea and vomiting)    Seizures (HCC)    once in 2016 due to alcohol poisioning   Suicide [facility])     Past Surgical History:  Procedure Laterality Date   CESAREAN SECTION N/A 08/30/2020   Procedure: CESAREAN SECTION;  Surgeon: Kathrynn Running, MD;  Location: [facility] LD ORS;  Service: Obstetrics;  Laterality: N/A;   CHOLECYSTECTOMY  2015   COSMETIC SURGERY  Age 13   dog bite to face   IR FLUORO GUIDED NEEDLE PLC ASPIRATION/INJECTION LOC  08/22/2020   IR US GUIDE BX ASP/DRAIN  08/22/2020   LAMINECTOMY N/A 08/22/2020   Procedure: THORACIC TWELVE TO LUMBAR ONELAMINECTOMY FOR DRAINAGE OF EPIDURAL ABSCESS;  Surgeon: Barnett Abu, MD;  Location: [facility] OR;  Service: Neurosurgery;  Laterality: N/A;   Family History:  Family History  Problem Relation Age of Onset   Alcohol abuse Mother    Stroke Mother    Bipolar disorder Mother    Drug abuse Mother    Alcohol abuse Father    Bipolar disorder Father    Drug abuse Father  Diabetes Father    Heart failure Father    Cancer Maternal Aunt        "stomach cancer"   Diabetes Maternal Aunt    Crohn's disease Paternal Aunt    Crohn's disease Paternal Aunt    Crohn's disease Paternal Uncle    Colon cancer Paternal Uncle    Breast cancer Maternal Grandmother    Colon cancer Maternal Grandfather    Diabetes type II Paternal Grandfather    Heart failure Paternal Grandfather    Celiac disease Neg Hx    Cervical cancer Neg Hx    Lung cancer Neg Hx     Social History:  Social History   Socioeconomic History   Marital status: Single    Spouse name: Not on file   Number of children: 2   Years of education: Not on file   Highest education level: GED or equivalent  Occupational History   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    Comment:    Tobacco Use   Smoking status: Former    Packs/day: 0.50    Years: 5.00     Additional pack years: 0.00    Total pack years: 2.50    Types: Cigarettes    Quit date: 11/24/2019    Years since quitting: 2.9   Smokeless tobacco: Never   Tobacco comments:    one pack cigarettes daily  Vaping Use   Vaping Use: Every day   Start date: 08/26/2019  Substance and Sexual Activity   Alcohol use: Yes    Comment: social   Drug use: Yes    Types: Marijuana    Comment: every other day   Sexual activity: Yes    Partners: Male    Birth control/protection: Other-see comments    Comment: female partner  Other Topics Concern   Not on file  Social History Narrative   Lives with her dad and her two children, will be going to [employer] in the spring. Works ar [employer].    Social Determinants of Health   Financial Resource Strain: Low Risk  (09/06/2021)   Overall Financial Resource Strain (CARDIA)    Difficulty of Paying Living Expenses: Not hard at all  Food Insecurity: No Food Insecurity (09/06/2021)   Hunger Vital Sign    Worried About Running Out of Food in the Last Year: Never true    Ran Out of Food in the Last Year: Never true  Transportation Needs: No Transportation Needs (09/06/2021)   PRAPARE - Administrator, Civil Service (Medical): No    Lack of Transportation (Non-Medical): No  Physical Activity: Insufficiently Active (09/06/2021)   Exercise Vital Sign    Days of Exercise per Week: 3 days    Minutes of Exercise per Session: 40 min  Stress: Stress Concern Present (09/06/2021)   [employer] of Occupational Health - Occupational Stress Questionnaire    Feeling of Stress : Very much  Social Connections: Socially Isolated (09/06/2021)   Social Connection and Isolation Panel [NHANES]    Frequency of Communication with Friends and Family: Three times a week    Frequency of Social Gatherings with Friends and Family: Twice a week    Attends Religious Services: Never    Database administrator or Organizations: No    Attends Museum/gallery exhibitions officer: Never    Marital Status: Never married    Allergies: No Known Allergies  Current Medications: Current Outpatient Medications  Medication Sig Dispense Refill   hydrOXYzine (  ATARAX) 10 MG tablet Take 1 tablet (10 mg total) by mouth 3 (three) times daily as needed. 90 tablet 1   vortioxetine HBr (TRINTELLIX) 5 MG TABS tablet Take 1 tablet (5 mg total) by mouth daily. 30 tablet 1   ibuprofen (ADVIL) 800 MG tablet Take 800 mg by mouth every 8 (eight) hours as needed.     ondansetron (ZOFRAN-ODT) 4 MG disintegrating tablet Take 1 tablet (4 mg total) by mouth every 8 (eight) hours as needed for nausea or vomiting. 30 tablet 1   Oxcarbazepine (TRILEPTAL) 300 MG tablet Take 1 tablet (300 mg total) by mouth 2 (two) times daily. 60 tablet 1   pantoprazole (PROTONIX) 40 MG tablet Take 1 tablet (40 mg total) by mouth daily. (Patient not taking: Reported on 10/23/2022) 30 tablet 3   traZODone (DESYREL) 100 MG tablet Take 1 tablet (100 mg total) by mouth at bedtime as needed for sleep (insomnia). 30 tablet 1   valACYclovir (VALTREX) 500 MG tablet Take 500 mg by mouth 2 (two) times daily.     No current facility-administered medications for this visit.     Musculoskeletal: Strength & Muscle Tone: within normal limits Gait & Station: normal Patient leans: N/A  Psychiatric Specialty Exam: Review of Systems  Last menstrual period 09/25/2022.There is no height or weight on file to calculate BMI.  General Appearance: Fairly Groomed  Eye Contact:  Good  Speech:  Clear and Coherent and Normal Rate  Volume:  Normal  Mood:  Anxious and Irritable  Affect:  Congruent  Thought Process:  Coherent, Goal Directed, and Linear  Orientation:  Full (Time, Place, and Person)  Thought Content: Logical   Suicidal Thoughts:  No  Homicidal Thoughts:  No  Memory:  NA  Judgement:  Good  Insight:  Fair  Psychomotor Activity:  Normal  Concentration:  Concentration: Good  Recall:  Good  Fund  of Knowledge: Good  Language: Good  Akathisia:  NA    AIMS (if indicated): not done  Assets:  Communication Skills Desire for Improvement Housing Talents/Skills Transportation Vocational/Educational  ADL's:  Intact  Cognition: WNL  Sleep:  Good   Metabolic Disorder Labs: Lab Results  Component Value Date   HGBA1C 5.1 02/12/2022   MPG 97 07/21/2012   MPG 94 04/01/2011   No results found for: "PROLACTIN" Lab Results  Component Value Date   CHOL 164 07/09/2019   TRIG 199 (H) 07/09/2019   HDL 38 (L) 07/09/2019   CHOLHDL 4.3 07/09/2019   VLDL 12 07/21/2012   LDLCALC 92 07/09/2019   LDLCALC 68 12/16/2015   Lab Results  Component Value Date   TSH 1.700 02/12/2022   TSH 1.030 07/09/2019    Therapeutic Level Labs: No results found for: "LITHIUM" No results found for: "VALPROATE" No results found for: "CBMZ"   Screenings: AUDIT    Flowsheet Row ED to [facility] ([facility]  Alcohol Use Disorder Identification Test Final Score (AUDIT) 0      GAD-7    Flowsheet Row Office Visit from 10/23/2022 in [facility] Office Visit from 06/03/2022 in [facility] Office Visit from 02/12/2022 in [facility] Office Visit from 01/03/2022 in [facility] Office Visit from 09/06/2021 in [facility]  Total GAD-7 Score 20 19 21 21 19       PHQ2-9    Flowsheet  Row Office Visit from 10/23/2022 in [facility] Office Visit from 06/03/2022 in [facility] Office Visit from 02[facility]12/2022 in [facility] Office Visit from 01/03/2022 in [facility] Office Visit from 09/06/2021 in [facility]  PHQ-2 Total Score 3 3 2 5 4   PHQ-9 Total Score 15 13 11 24 20       Flowsheet Row ED from 07/24/2022 in [facility] Visit from [facility] from 01/17/2022 in [facility] Department at [facility]  C-SSRS RISK CATEGORY No Risk No Risk No Risk       Collaboration of Care: Collaboration of Care: Medication Management AEB medication prescription, Primary Care Provider AEB chart review, and Referral or follow-up with counselor/therapist AEB chart review  Patient/Guardian was advised Release of Information must be obtained prior to any record release in order to collaborate their care with an outside provider. Patient/Guardian was advised if they have not already done so to contact the registration department to sign all necessary forms in order for [place] to release information regarding their care.   Consent: Patient/Guardian gives verbal consent for treatment and assignment of benefits for services provided during this visit. Patient/Guardian expressed understanding and agreed to proceed.    Stasia Cavalier, MD 10/29/2022, 5:26 PM

## 2022-10-31 NOTE — Progress Notes (Signed)
Wound Culture showed no growth. No need for Antibiotic

## 2022-11-13 ENCOUNTER — Ambulatory Visit: Payer: Medicaid Other | Admitting: Obstetrics & Gynecology

## 2022-11-19 ENCOUNTER — Ambulatory Visit: Payer: Medicaid Other | Admitting: Adult Health

## 2022-11-26 ENCOUNTER — Telehealth (HOSPITAL_COMMUNITY): Payer: Self-pay

## 2022-11-26 ENCOUNTER — Telehealth (HOSPITAL_COMMUNITY): Payer: Self-pay | Admitting: Licensed Clinical Social Worker

## 2022-11-26 IMAGING — US US MFM OB LIMITED
1 series · 14 of 28 positions shown · non-contrast
Comparison: none

[Series 1: us mfm ob limited · 51 acquisitions, 14 frames shown]
[im 2/51]
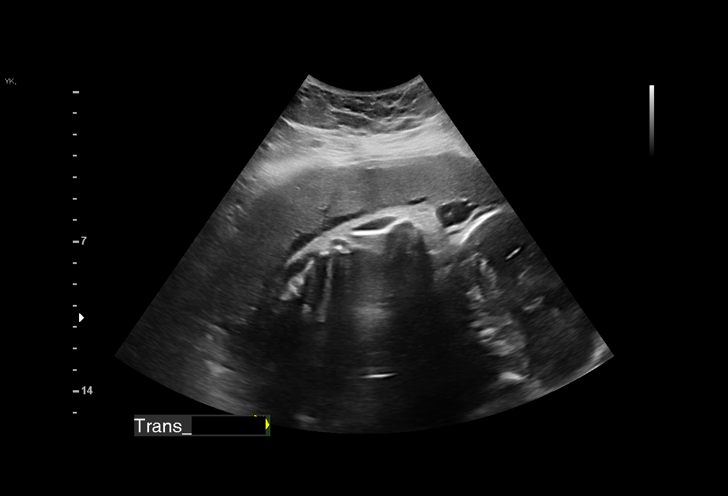
[im 6/51]
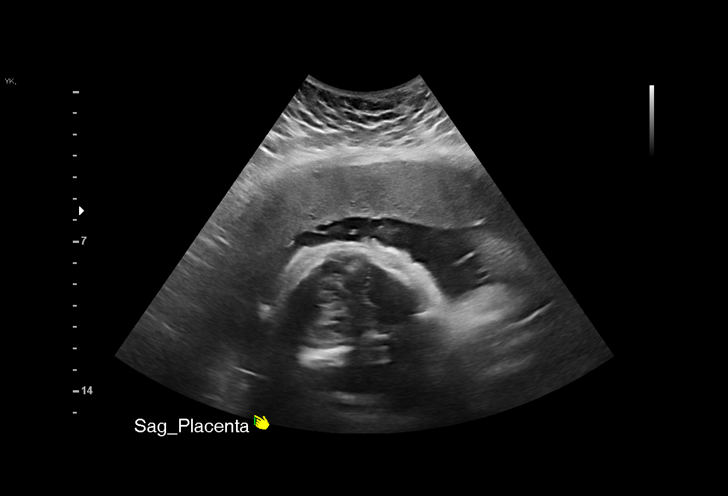
[im 10/51]
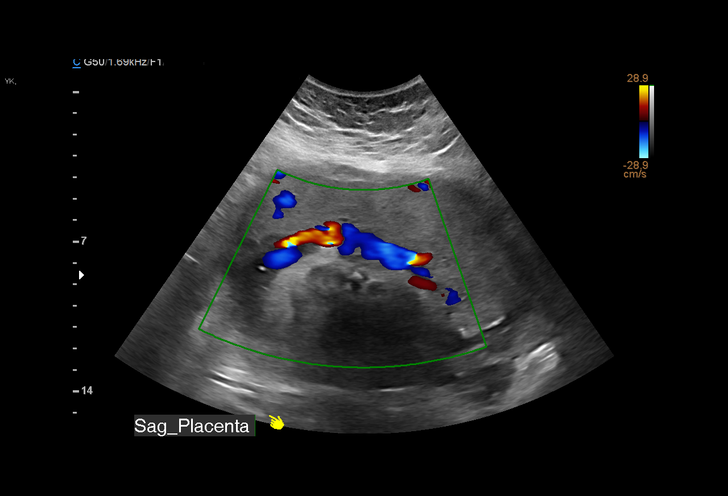
[im 13/51]
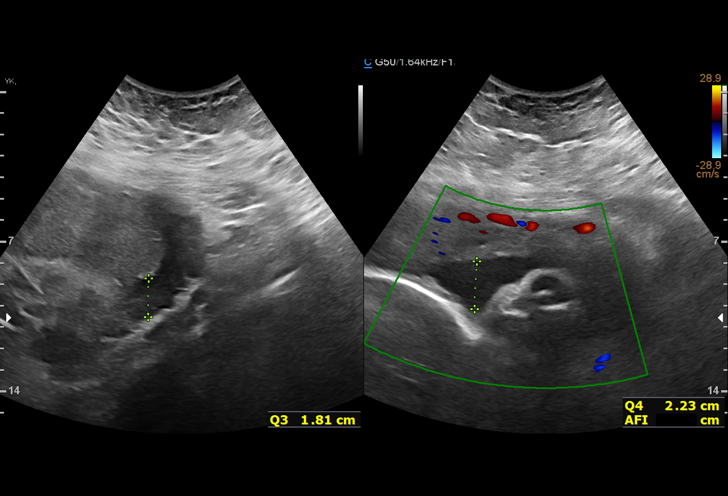
[im 17/51]
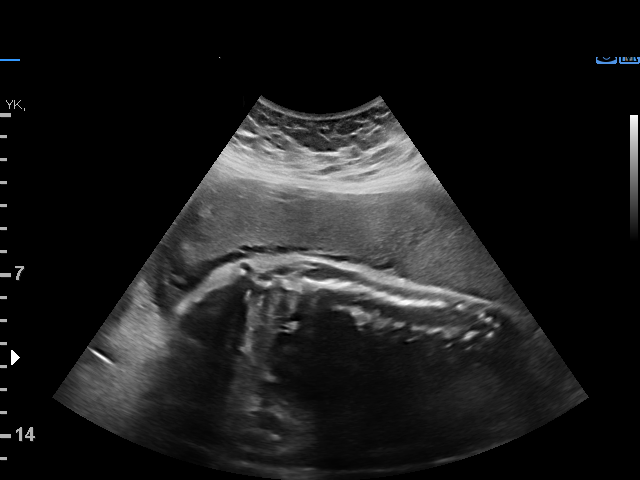
[im 21/51]
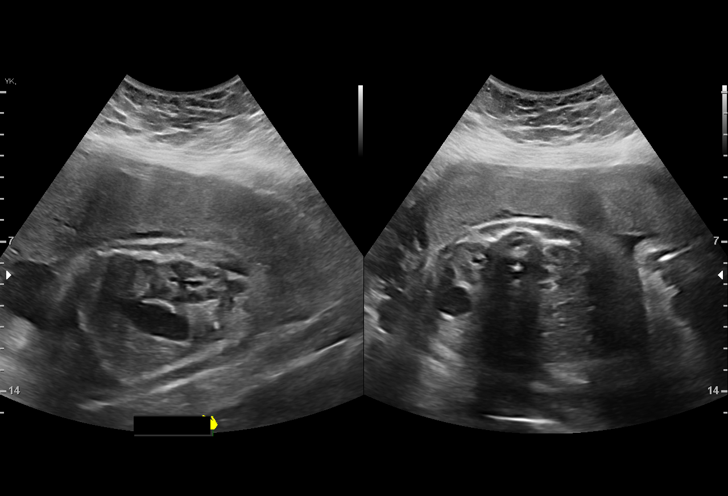
[im 25/51]
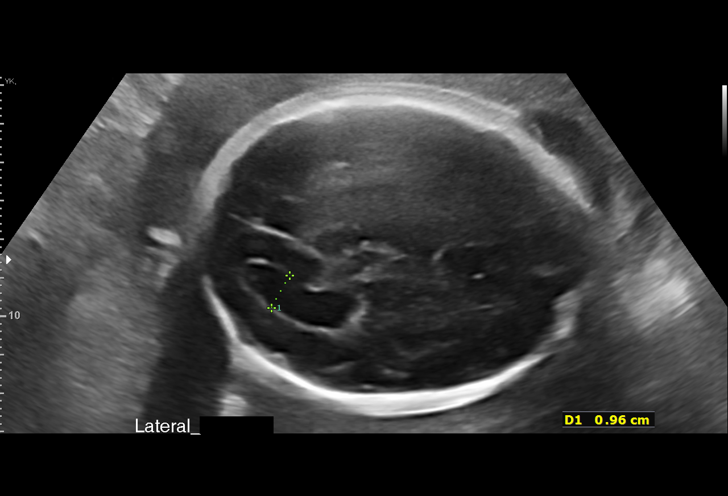
[im 28/51]
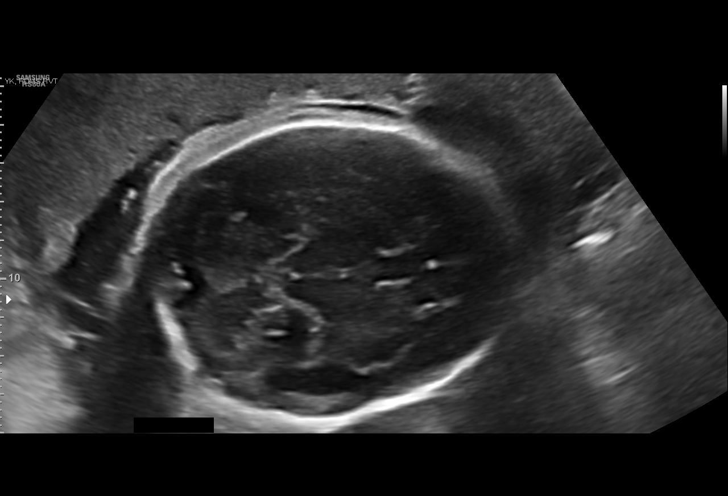
[im 32/51]
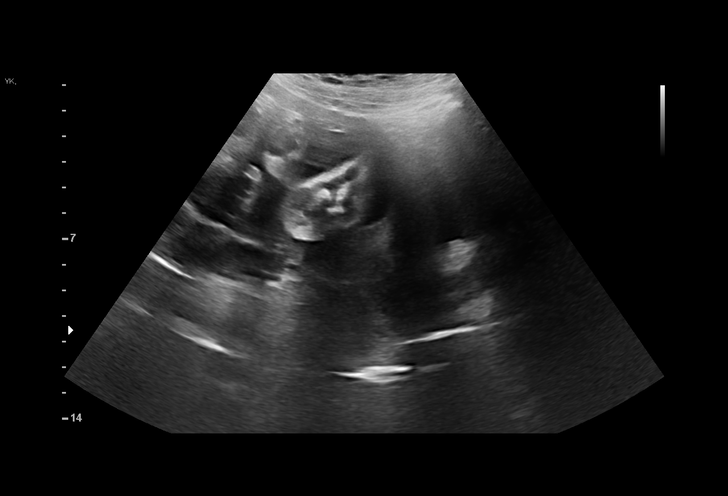
[im 36/51]
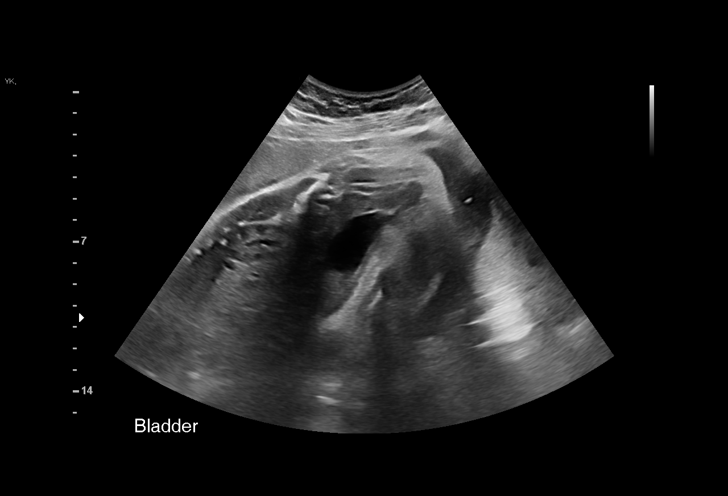
[im 39/51]
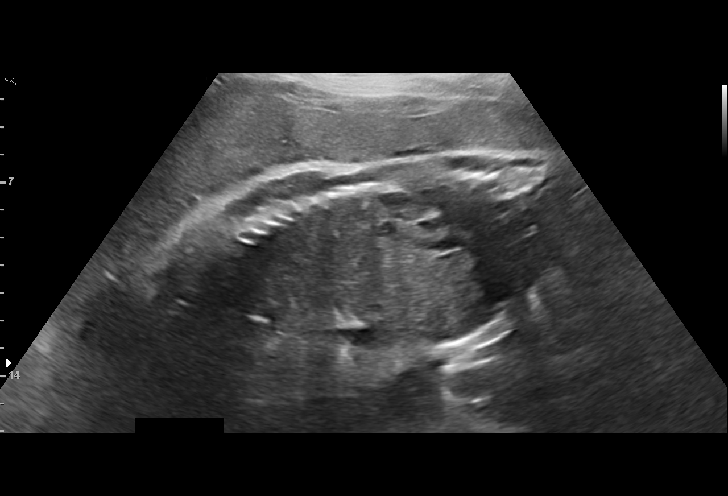
[im 43/51]
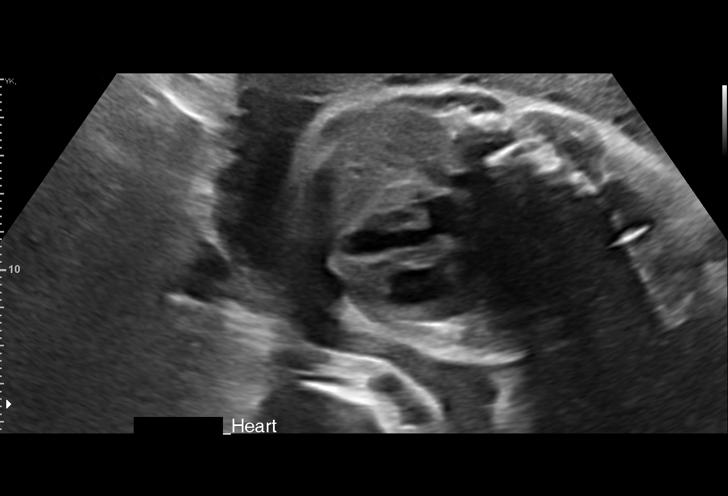
[im 47/51]
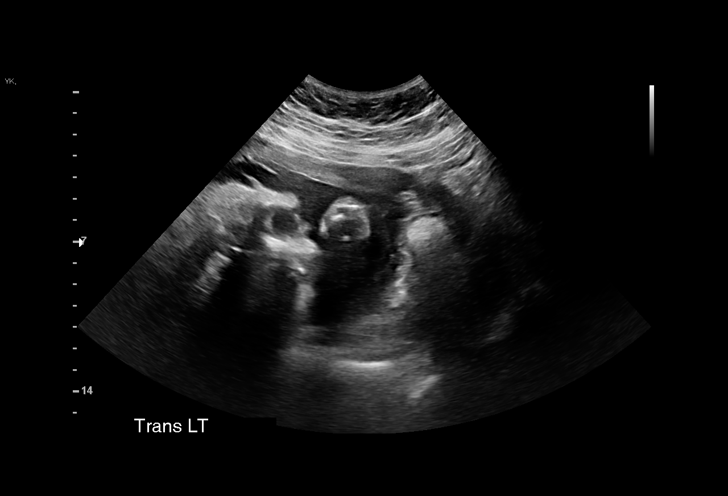
[im 51/51]
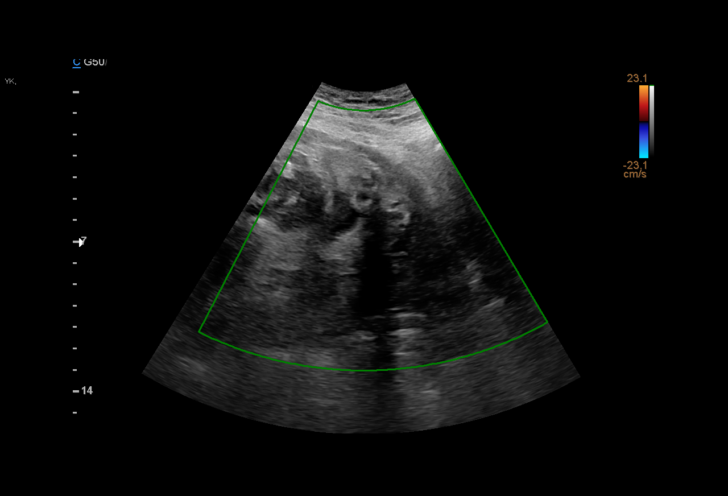

[14 of 28 positions shown; findings below may reference images not displayed]

Referred By:      ANNER SANGUINO              [facility]
                   [facility]                               [HOSPITAL]

 1  US MFM OB LIMITED                     76815.01    LIBARDI KANT

Indications

 Vaginal bleeding in pregnancy, third trimester
 Obesity complicating pregnancy, third
 trimester
 Encounter for antenatal screening,
 unspecified
 Poor obstetric history-Recurrent (habitual)
 abortion (3 consecutive ab's)
 30 weeks gestation of pregnancy
Fetal Evaluation

 Num Of Fetuses:          1
 Fetal Heart Rate(bpm):   148
 Cardiac Activity:        Observed
 Presentation:            Breech, footling
 Placenta:                Anterior
 P. Cord Insertion:       Visualized, central

 Amniotic Fluid
 AFI FV:      Within normal limits

 AFI Sum(cm)     %Tile       Largest Pocket(cm)
 11.1            23

 RUQ(cm)       RLQ(cm)       LUQ(cm)        LLQ(cm)


 Comment:    No placental abruption or previa identified.
OB History
 Gravidity:    5          SAB:   2
 TOP:          1        Living:  1
Gestational Age

 LMP:           30w 6d        Date:  12/26/19                 EDD:   10/01/20
 Best:          30w 6d     Det. By:  LMP  (12/26/19)          EDD:   10/01/20
Anatomy

 Cranium:               Appears normal         Heart:                  Appears normal
                                                                       (4CH, axis, and
                                                                       situs)
 Cavum:                 Appears normal         Diaphragm:              Appears normal
 Ventricles:            Appears normal         Stomach:                Appears normal, left
                                                                       sided
 Choroid Plexus:        Appears normal         Abdomen:                Appears normal
 Cerebellum:            Appears normal         Cord Vessels:           Appears normal (3
                                                                       vessel cord)
 Posterior Fossa:       Appears normal         Kidneys:                Appear normal
 Nuchal Fold:           Not applicable (>20    Bladder:                Appears normal
                        wks GA)
 Lips:                  Appears normal
Cervix Uterus Adnexa

 Cervix
 Length:              4  cm.
 Normal appearance by transabdominal scan.

 Uterus
 No abnormality visualized.

 Right Ovary
 Within normal limits.

 Left Ovary
 Within normal limits.

 Cul De Sac
 No free fluid seen.

 Adnexa
 No abnormality visualized.
Comments

 This patient presented to the [facility] due to vaginal bleeding.

 A limited ultrasound performed today shows that the fetus is
 in the breech presentation.

 There was normal amniotic fluid noted.

 The anterior placenta appeared within normal limits with no
 retroplacental clots or placenta previa noted.

## 2022-11-26 NOTE — Telephone Encounter (Signed)
Patient called stating that she is having increased anxiety and depression. She is unable to get out of bed and do schoolwork. She has a follow up on Friday but was not wanting to wait. I transferred her to the front to see if she could get in sooner and told her I would message yo as well. Please review and advise, thank you

## 2022-11-29 ENCOUNTER — Telehealth (HOSPITAL_BASED_OUTPATIENT_CLINIC_OR_DEPARTMENT_OTHER): Payer: MEDICAID | Admitting: Psychiatry

## 2022-11-29 ENCOUNTER — Encounter (HOSPITAL_COMMUNITY): Payer: Self-pay | Admitting: Psychiatry

## 2022-11-29 DIAGNOSIS — F330 Major depressive disorder, recurrent, mild: Secondary | ICD-10-CM

## 2022-11-29 DIAGNOSIS — F603 Borderline personality disorder: Secondary | ICD-10-CM | POA: Diagnosis not present

## 2022-11-29 MED ORDER — VORTIOXETINE HBR 10 MG PO TABS
10.0000 mg | ORAL_TABLET | Freq: Every day | ORAL | 1 refills | Status: DC
Start: 2022-11-29 — End: 2023-01-03

## 2022-11-29 MED ORDER — OXCARBAZEPINE 300 MG PO TABS: 300.0000 mg | ORAL_TABLET | Freq: Two times a day (BID) | ORAL | 1 refills | Status: DC

## 2022-11-29 MED ORDER — HYDROXYZINE HCL 25 MG PO TABS
25.0000 mg | ORAL_TABLET | Freq: Three times a day (TID) | ORAL | 2 refills | Status: DC | PRN
Start: 2022-11-29 — End: 2024-03-25

## 2022-11-29 NOTE — Progress Notes (Signed)
BH MD/PA/NP OP Progress Note  11/29/2022 12:50 PM Colleen Valentine  MRN:  [id number]  Visit Diagnosis:    ICD-10-CM   1. Borderline personality disorder (HCC)  F60.3 hydrOXYzine (ATARAX) 25 MG tablet    vortioxetine HBr (TRINTELLIX) 10 MG TABS tablet    Oxcarbazepine (TRILEPTAL) 300 MG tablet    2. Mild episode of recurrent major depressive disorder (HCC)  F33.0 vortioxetine HBr (TRINTELLIX) 10 MG TABS tablet        Assessment: Colleen Valentine is a 27 y.o. female with a history of oppositional defiant disorder, ADHD, anxiety, depression, borderline personality disorder, PTSD, and marijuana use disorder. who presented to [facility] for initial evaluation on 06/03/2022.    During initial evaluation patient reported symptoms of mood lability including depressed mood, irritability, anxiety, and elevated mood.  She also endorsed symptoms of impulsive behaviors, chronic feelings of emptiness, emotional instability, real or perceived fear of abandonment, dissociative symptoms, and a past history of trauma.  Of note patient did endorse a history of nonsuicidal self-injury and poor sense of self that had improved after connecting with CBT/DBT groups.  Based on patient's symptoms and her past history of borderline personality disorder it does appear she is having reoccurrence of the symptoms and would benefit from restarting on medications and reconnecting with therapy/groups.  Colleen Valentine presents for follow-up evaluation. Today, 11/29/22, patient reports struggling with increased depression and anxiety over the past couple weeks.  She endorsed symptoms of mood lability, irritability, amotivation, and racing thoughts.  Patient will continue with therapy and was referred for the partial program.  We will titrate Trintellix to 10 mg today and hydroxyzine to 25 mg 3 times a day as needed.  Patient had tolerated Trintellix and hydroxyzine well initially other than some nausea and  vomiting from Trintellix which improved when she began taking it with food.  We will follow-up in a month.  Plan: - Continue Trileptal 300 mg BID - Increase Trintellix 10 mg QD - Continue hydroxyzine 25 mg TID prn for anxiety - Continue trazodone 100 QHS prn for insomnia - Continue therapy with [facility] for [facility].  [facility] information provided - PHP referral - CMP, CBC, iron panel, Vit B12, TSH, A1c reviewed - EKG from 02/12/22 reviewed QTC 398 - Crisis resources reviewed - Follow up in 4-6 weeks   Chief Complaint:  Chief Complaint  Patient presents with   Follow-up   HPI: In the interim patient reached out c/o increased anxiety and depression.  Endorsing symptoms of increased irritability, amotivation, mood lability, and following on his schoolwork.  She denied any SI/HI or thoughts of harm.  We also recommended referral to [facility] program.  On presentation today Colleen Valentine reports that she is about the same though notes that since speaking to this provider earlier in the week she has been trying to do more time outside of her house.  Patient has been going to see her family which has been helpful and has had some improvement in her mood symptoms.  Patient notes that she plans to call back about PHP on Monday to discuss the program.  She also was updated about communications provider had with her therapist.  We discussed patient's medication regimen and she notes that the Trintellix seem to have some initial improvement in regards to her mood symptoms.  As for side effects she did have nausea and vomiting however this improved when she started taking the medication with food.  She denies any side  effects since.  We will titrate the dose today which she is agreeable to.  Patient also expressed interest in further increasing hydroxyzine dose as is had some minimal effect without any adverse side effects.  We agreed to increase the dose to 25 mg today.  Risk and benefits of both  adjustments were discussed.  Past Psychiatric History: She has a past psychiatric history of oppositional defiant disorder, ADHD, anxiety, depression, borderline personality disorder, PTSD, Schizoaffective disorder, and marijuana use disorder.  At 16 she was in psychiatric residential facility following a suicide attempt after her mom passed. Learned CBT and DBT there that she carried with her and had helped since.  Most notably the cognitive re framing.  She has seen a therapist intermittently in the past  Medications- Pristiq, Zoloft, Paxil, Prozac, Lexapro, Wellbutrin, Cymbalta (palpitations), BuSpar, trazodone, lamictal, seroquel, hydroxyzine, ativan, and gabapentin.  She endorses daily marijuana want to use to help with anxiety symptoms.  She does note that it negatively impacts her concentration and increases her appetite.  Risk of marijuana use were discussed and adverse effects on anxiety were explained. Alcohol social use of one drink every couple weeks Cocaine use as a teenager   Past Medical History:  Past Medical History:  Diagnosis Date   Acute pyelonephritis 05/17/2018   Anemia    Anxiety    Complication of anesthesia    Depression    GERD (gastroesophageal reflux disease)    Herpes genitalia    Insomnia    Obesity    Polysubstance abuse (HCC) 04/01/2011   PONV (postoperative nausea and vomiting)    Seizures (HCC)    once in 2016 due to alcohol poisioning   Suicide [facility])     Past Surgical History:  Procedure Laterality Date   CESAREAN SECTION N/A 08/30/2020   Procedure: CESAREAN SECTION;  Surgeon: Kathrynn Running, MD;  Location: [facility] LD ORS;  Service: Obstetrics;  Laterality: N/A;   CHOLECYSTECTOMY  2015   COSMETIC SURGERY  Age 75   dog bite to face   IR FLUORO GUIDED NEEDLE PLC ASPIRATION/INJECTION LOC  08/22/2020   IR US GUIDE BX ASP/DRAIN  08/22/2020   LAMINECTOMY N/A 08/22/2020   Procedure: THORACIC TWELVE TO LUMBAR ONELAMINECTOMY FOR DRAINAGE OF EPIDURAL ABSCESS;   Surgeon: Barnett Abu, MD;  Location: [facility] OR;  Service: Neurosurgery;  Laterality: N/A;   Family History:  Family History  Problem Relation Age of Onset   Alcohol abuse Mother    Stroke Mother    Bipolar disorder Mother    Drug abuse Mother    Alcohol abuse Father    Bipolar disorder Father    Drug abuse Father    Diabetes Father    Heart failure Father    Cancer Maternal Aunt        "stomach cancer"   Diabetes Maternal Aunt    Crohn's disease Paternal Aunt    Crohn's disease Paternal Aunt    Crohn's disease Paternal Uncle    Colon cancer Paternal Uncle    Breast cancer Maternal Grandmother    Colon cancer Maternal Grandfather    Diabetes type II Paternal Grandfather    Heart failure Paternal Grandfather    Celiac disease Neg Hx    Cervical cancer Neg Hx    Lung cancer Neg Hx     Social History:  Social History   Socioeconomic History   Marital status: Single    Spouse name: Not on file   Number of children: 2   Years of  education: Not on file   Highest education level: GED or equivalent  Occupational History   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    Comment:    Tobacco Use   Smoking status: Former    Packs/day: 0.50    Years: 5.00    Additional pack years: 0.00    Total pack years: 2.50    Types: Cigarettes    Quit date: 11/24/2019    Years since quitting: 3.0   Smokeless tobacco: Never   Tobacco comments:    one pack cigarettes daily  Vaping Use   Vaping Use: Every day   Start date: 08/26/2019  Substance and Sexual Activity   Alcohol use: Yes    Comment: social   Drug use: Yes    Types: Marijuana    Comment: every other day   Sexual activity: Yes    Partners: Male    Birth control/protection: Other-see comments    Comment: female partner  Other Topics Concern   Not on file  Social History Narrative   Lives with her dad and her two children, will be going to [employer] in the spring. Works ar [employer].    Social  Determinants of Health   Financial Resource Strain: Low Risk  (09/06/2021)   Overall Financial Resource Strain (CARDIA)    Difficulty of Paying Living Expenses: Not hard at all  Food Insecurity: No Food Insecurity (09/06/2021)   Hunger Vital Sign    Worried About Running Out of Food in the Last Year: Never true    Ran Out of Food in the Last Year: Never true  Transportation Needs: No Transportation Needs (09/06/2021)   PRAPARE - Administrator, Civil Service (Medical): No    Lack of Transportation (Non-Medical): No  Physical Activity: Insufficiently Active (09/06/2021)   Exercise Vital Sign    Days of Exercise per Week: 3 days    Minutes of Exercise per Session: 40 min  Stress: Stress Concern Present (09/06/2021)   [employer] of Occupational Health - Occupational Stress Questionnaire    Feeling of Stress : Very much  Social Connections: Socially Isolated (09/06/2021)   Social Connection and Isolation Panel [NHANES]    Frequency of Communication with Friends and Family: Three times a week    Frequency of Social Gatherings with Friends and Family: Twice a week    Attends Religious Services: Never    Database administrator or Organizations: No    Attends Engineer, structural: Never    Marital Status: Never married    Allergies: No Known Allergies  Current Medications: Current Outpatient Medications  Medication Sig Dispense Refill   hydrOXYzine (ATARAX) 25 MG tablet Take 1 tablet (25 mg total) by mouth 3 (three) times daily as needed for anxiety. 90 tablet 2   ibuprofen (ADVIL) 800 MG tablet Take 800 mg by mouth every 8 (eight) hours as needed.     ondansetron (ZOFRAN-ODT) 4 MG disintegrating tablet Take 1 tablet (4 mg total) by mouth every 8 (eight) hours as needed for nausea or vomiting. 30 tablet 1   Oxcarbazepine (TRILEPTAL) 300 MG tablet Take 1 tablet (300 mg total) by mouth 2 (two) times daily. 60 tablet 1   pantoprazole (PROTONIX) 40 MG tablet Take 1  tablet (40 mg total) by mouth daily. (Patient not taking: Reported on 10/23/2022) 30 tablet 3   traZODone (DESYREL) 100 MG tablet Take 1 tablet (100 mg total) by mouth at bedtime as needed for sleep (insomnia). 30  tablet 1   valACYclovir (VALTREX) 500 MG tablet Take 500 mg by mouth 2 (two) times daily.     vortioxetine HBr (TRINTELLIX) 10 MG TABS tablet Take 1 tablet (10 mg total) by mouth daily. 30 tablet 1   No current facility-administered medications for this visit.     Musculoskeletal: Strength & Muscle Tone: within normal limits Gait & Station: normal Patient leans: N/A  Psychiatric Specialty Exam: Review of Systems  There were no vitals taken for this visit.There is no height or weight on file to calculate BMI.  General Appearance: Fairly Groomed  Eye Contact:  Good  Speech:  Clear and Coherent and Normal Rate  Volume:  Normal  Mood:  Anxious and Depressed  Affect:  Congruent  Thought Process:  Coherent, Goal Directed, and Linear  Orientation:  Full (Time, Place, and Person)  Thought Content: Logical   Suicidal Thoughts:  No  Homicidal Thoughts:  No  Memory:  NA  Judgement:  Fair  Insight:  Fair  Psychomotor Activity:  Normal  Concentration:  Concentration: Fair  Recall:  Good  Fund of Knowledge: Good  Language: Good  Akathisia:  NA    AIMS (if indicated): not done  Assets:  Communication Skills Desire for Improvement Housing Talents/Skills Transportation Vocational/Educational  ADL's:  Intact  Cognition: WNL  Sleep:  Good   Metabolic Disorder Labs: Lab Results  Component Value Date   HGBA1C 5.1 02/12/2022   MPG 97 07/21/2012   MPG 94 04/01/2011   No results found for: "PROLACTIN" Lab Results  Component Value Date   CHOL 164 07/09/2019   TRIG 199 (H) 07/09/2019   HDL 38 (L) 07/09/2019   CHOLHDL 4.3 07/09/2019   VLDL 12 07/21/2012   LDLCALC 92 07/09/2019   LDLCALC 68 12/16/2015   Lab Results  Component Value Date   TSH 1.700 02/12/2022   TSH  1.030 07/09/2019    Therapeutic Level Labs: No results found for: "LITHIUM" No results found for: "VALPROATE" No results found for: "CBMZ"   Screenings: AUDIT    Flowsheet Row ED to Hosp-Ad[facility] (Dis[facility]  Alcohol Use Disorder Identification Test Final Score (AUDIT) 0      GAD-7    Flowsheet Row Office Visit from 10/23/2022 in [facility] Office Visit from [facility] Office Visit from 02/12/2022 in [facility] Office Visit from 01/03/2022 in [facility] Office Visit from 09/06/2021 in [facility]  Total GAD-7 Score 20 19 21 21 19       PHQ2-9    Flowsheet Row Office Visit from 10/23/2022 in [facility] Office Visit from 06/03/2022 in [facility] Office Visit from 02/12/2022 in [facility] Office Visit from 01/03/2022 in [facility] Office Visit from 09/06/2021 in [facility]  PHQ-2 Total Score 3 3 2 5 4   PHQ-9 Total Score 15 13 11 24 20       Flowsheet Row ED from 07/24/2022 in [facility] Visit from 06[facility]03/2022 in [facility] ED from 01/17/2022 in [facility] at [facility]  C-SSRS RISK CATEGORY No Risk No Risk No Risk       Collaboration of Care: Collaboration of  Care: Medication Management AEB medication prescription, Primary Care Provider AEB chart review, and Referral or follow-up with counselor/therapist AEB chart review  Patient/Guardian was advised Release of Information must be obtained prior to any record release in order to  collaborate their care with an outside provider. Patient/Guardian was advised if they have not already done so to contact the registration department to sign all necessary forms in order for [place] to release information regarding their care.   Consent: Patient/Guardian gives verbal consent for treatment and assignment of benefits for services provided during this visit. Patient/Guardian expressed understanding and agreed to proceed.    Virtual Visit via Video Note  I connected with Colleen Valentine on 11/29/22 at 10:30 AM EDT by a video enabled telemedicine application and verified that I am speaking with the correct person using two identifiers.  Location: Patient: Home Provider: Home Office   I discussed the limitations of evaluation and management by telemedicine and the availability of in person appointments. The patient expressed understanding and agreed to proceed.   I discussed the assessment and treatment plan with the patient. The patient was provided an opportunity to ask questions and all were answered. The patient agreed with the plan and demonstrated an understanding of the instructions.   The patient was advised to call back or seek an in-person evaluation if the symptoms worsen or if the condition fails to improve as anticipated.  I provided 10 minutes of non-face-to-face time during this encounter  Stasia Cavalier, MD 11/29/2022, 12:50 PM

## 2022-12-09 ENCOUNTER — Ambulatory Visit (INDEPENDENT_AMBULATORY_CARE_PROVIDER_SITE_OTHER): Payer: MEDICAID | Admitting: Clinical

## 2022-12-09 ENCOUNTER — Encounter (HOSPITAL_COMMUNITY): Payer: Self-pay | Admitting: Clinical

## 2022-12-09 DIAGNOSIS — F330 Major depressive disorder, recurrent, mild: Secondary | ICD-10-CM

## 2022-12-09 DIAGNOSIS — F1290 Cannabis use, unspecified, uncomplicated: Secondary | ICD-10-CM

## 2022-12-09 DIAGNOSIS — F603 Borderline personality disorder: Secondary | ICD-10-CM

## 2022-12-09 DIAGNOSIS — Z8659 Personal history of other mental and behavioral disorders: Secondary | ICD-10-CM | POA: Diagnosis not present

## 2022-12-09 DIAGNOSIS — F1210 Cannabis abuse, uncomplicated: Secondary | ICD-10-CM

## 2022-12-10 NOTE — Progress Notes (Signed)
THERAPIST PROGRESS NOTE  Session Time: 1:00pm-2:00pm  Session #7  Virtual Visit via Video Note  I connected with Colleen Valentine on 12/10/22 at  1:00 PM EDT by a video enabled telemedicine application and verified that I am speaking with the correct person using two identifiers.  Location: Patient: home Provider: [facility] office   I discussed the limitations of evaluation and management by telemedicine and the availability of in person appointments. The patient expressed understanding and agreed to proceed.  I discussed the assessment and treatment plan with the patient. The patient was provided an opportunity to ask questions and all were answered. The patient agreed with the plan and demonstrated an understanding of the instructions.   The patient was advised to call back or seek an in-person evaluation if the symptoms worsen or if the condition fails to improve as anticipated.  I provided 60 minutes of non-face-to-face time during this encounter.  Colleen Chad, LCSW   Participation Level: Active  Behavioral Response: Casual Alert Negative, Anxious, Depressed, and Hopeless  Type of Therapy: Individual Therapy  Treatment Goals addressed:  LTG: Increase coping skills to manage depression and improve ability to perform daily activities  STG: Reduce overall depression score to no more than 9 on the Patient Health Questionnaire (PHQ-9)  LTG: Recall traumatic events without becoming overwhelmed with negative emotions  STG:  Colleen Valentine will practice emotion regulation skills 5 time(s) per week for the next 26 week(s)  LTG: Verbalize the situations that can easily trigger feelings of fear, depression, and anger  STG: Verbalize the impact of childhood experiences of abuse, neglect and abandonment on current feelings and relationships  LTG: Colleen Valentine will be able to list her 5 top reasons to become abstinent of marijuana and other mind-altering  substances  STG: Colleen Valentine will develop a Plan of Action regarding her desire to stop smoking marijuana   ProgressTowards Goals: Progressing  Interventions: CBT, Supportive, and Reframing  Summary: Colleen Valentine is a 27 y.o. female who presents with Borderline Personality Disorder, depression, PTSD, and cannabis use disorder.  She reported that she has missed her medicines several times and she always notices this immediately.  Part of why she misses the medicine is her extreme disorganization.  She was somewhat irritable, stated she does not feel she is being treated for everything that is going on.  She was diagnosed with ADHD in her late teens because her father did not take care of medical needs during her childhood.  She believes strongly that the reason she cannot handle being around other people, certain noises, and is so forgetful is untreated ADHD.  CSW agreed to bring this up to her doctor.  She "fell into a depression and did not do the work for a couple of weeks" so is not sure if she will pass her classes for the summer semester, although if she makes good grades on the finals, she may.  This is a concern because school is usually her one happy place where she goes to escape life's problems.  For the last two weeks, she stated she has either laid in the dark, not showering, not brushing her teeth OR she got up and was sad and angry constantly, could not be around anybody.  Patient stated she wants to kill herself sometimes, "but not right now."  She feels "so angry I could rip somebody's head off their shoulders."  These extreme feelings lead her to isolate, as she does not want her partner  or kids to be around her.  She feels helpless and states she is "so mad I'm ready to sign rights to my daughter over to her father.    Yesterday she had an outburst outside of her daughter's father's house, where he lives with his girlfriend and his mother.  She told the entire story, her part in it, and  what she did.  She stated that ultimately she "lost it" and was cursing and slamming doors.  She then remarked that she had not taken her medicine the day before this incident.  She was loud and irate throughout this description of what happened.  She also talked about the baby's father being in violation of the custody order, but mentioned that the paperwork he has and the paperwork she has are different.  CSW encouraged her to go to the courthouse tomorrow and get an official copy of the court order in order to know that she and he are working from the same information, rather than making assumptions which causes many hardships.    Patient actually calmed herself down without assistance and said that looking back now, she can start to see that she may have gotten angry over some assumptions.  She shared what these were and CSW shared additional ones.  We talked again about Borderline Personality disorder and how the tendency is to react immediately to a situation without giving it thought.  We reviewed some of the mechanisms she an use to "stop and think."  We also reviewed various possible solutions to keep her from forgetting her medicines, without much success.  At best, CSW was able to get her to see that use of a pillbox is mandatory rather than optional, to be able to know if she has or has not taken her medicine.  It would be helpful for her to know exactly how far apart her two daily doses need to be, because she often will skip the early dose if she thinks it may be detrimental to take them too close together.  She did not bring up her son at all or share whether he is still with his father at this time.  She also did not talk about her marijuana use and whether it has increased, decreased, or stayed the same.  These items should be reviewed at an upcoming session.  Suicidal/Homicidal: No  Therapist Response: Patient is progressing AEB engaging in scheduled therapy session.  She presented oriented  x5 and stated she was feeling "worse, horrible."  CSW evaluated patient's medication compliance and self-care since last session.  Patient stated she has missed "quite a few" doses of her medicine due to forgetting, not wanting to take it on an empty stomach, having to keep it put away because of the small children in her home, and because she does not want to accidentally overdose.  She also reported being really nervous when she talks to doctor because she feels vulnerable and that she may not be understood.  This is why, for instance, she has not previously brought up her attention problems.  CSW reviewed CBT-related coping skills, specifically challenging her negative thoughts to determine if they are distortions.  Patient received these reminders willingly and could discuss her thoughts in a way to indicate good comprehension of where she had gone wrong with them.    Throughout the session, CSW gave patient the opportunity to explore thoughts and feelings associated with current life situations and past/present external stressors.   CSW encouraged patient's expression  of feelings and validated patient's thoughts using empathy, active listening, open body language, and unconditional positive regard.      Recommendations:  Return to therapy in 2 weeks, engage in self care behaviors, continue to work on developing a solution about how to ensure she does not miss medication doses  Plan: Return to therapy on 7/24  Diagnosis: Borderline personality disorder (HCC)  Mild episode of recurrent major depressive disorder (HCC)  Cannabis use disorder, mild, abuse  History of posttraumatic stress disorder (PTSD)   Collaboration of Care: Psychiatrist AEB - doctor and therapist can see notes in Epic  Patient/Guardian was advised Release of Information must be obtained prior to any record release in order to collaborate their care with an outside provider. Patient/Guardian was advised if they have not  already done so to contact the registration department to sign all necessary forms in order for [place] to release information regarding their care.   Consent: Patient/Guardian gives verbal consent for treatment and assignment of benefits for services provided during this visit. Patient/Guardian expressed understanding and agreed to proceed.   Colleen Chad, LCSW 12/10/2022

## 2022-12-11 IMAGING — CR DG LUMBAR SPINE 2-3V
2 series · 2 of 2 positions shown · non-contrast
Comparison: None.

CLINICAL DATA: Status post fall with back pain. Patient is
pregnant.

EXAM:
LUMBAR SPINE - 2-3 VIEW

[l-spine ap]
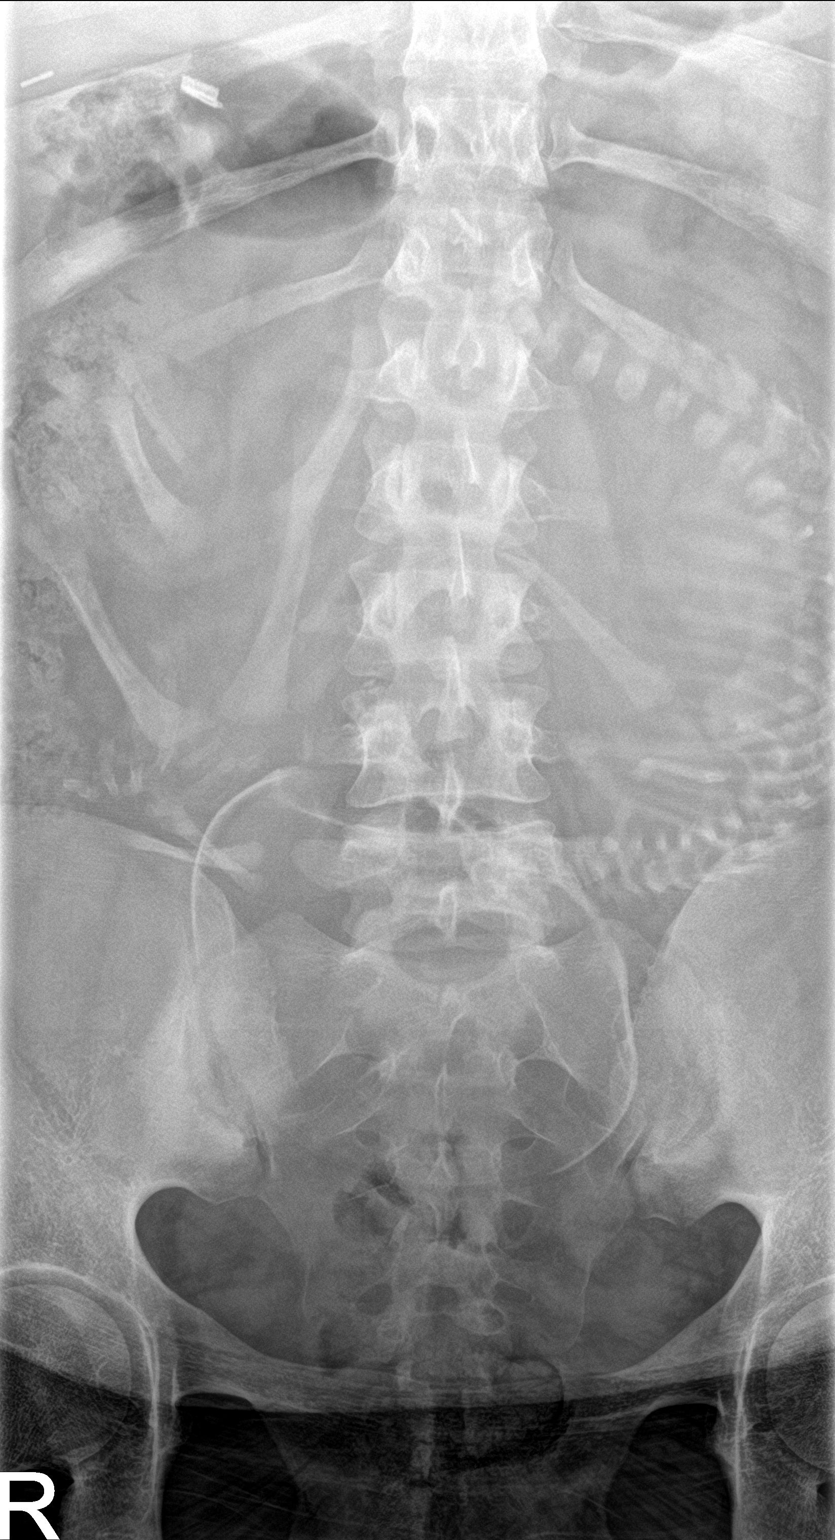

[l-spine lat]
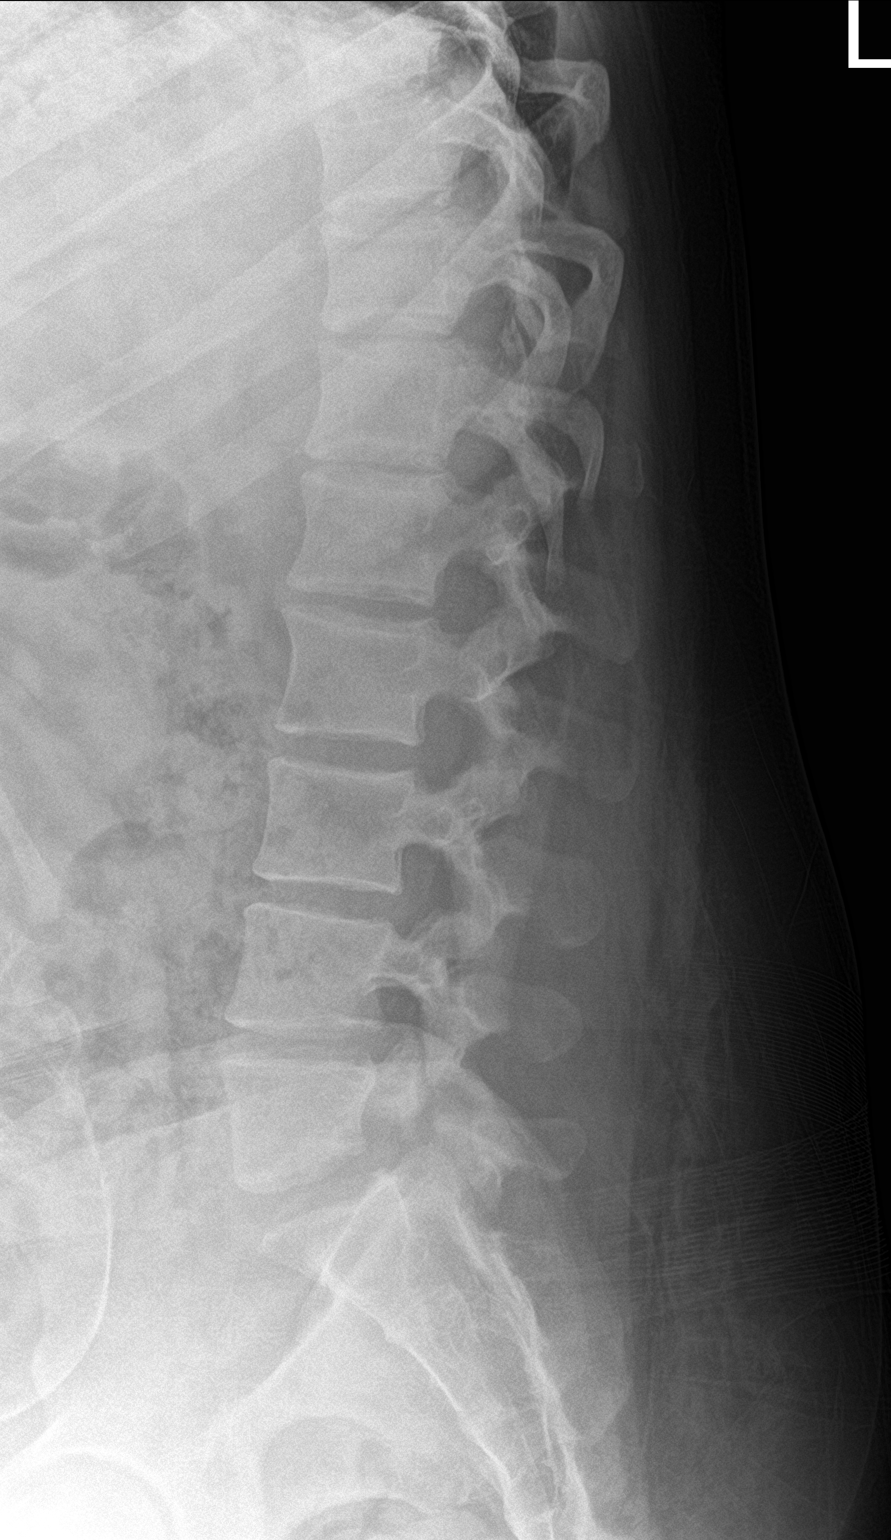

[2 of 2 positions shown; findings below may reference images not displayed]

FINDINGS: Pars defect of L5 are identified. No acute fracture or dislocation
is noted. Fetus is identified in abdomen and pelvis.
IMPRESSION: Pars defect of L5.  No acute fracture or dislocation.

## 2022-12-13 ENCOUNTER — Other Ambulatory Visit: Payer: Self-pay | Admitting: Family

## 2022-12-13 DIAGNOSIS — K219 Gastro-esophageal reflux disease without esophagitis: Secondary | ICD-10-CM

## 2022-12-18 ENCOUNTER — Encounter (HOSPITAL_COMMUNITY): Payer: Self-pay | Admitting: Clinical

## 2022-12-18 ENCOUNTER — Ambulatory Visit (INDEPENDENT_AMBULATORY_CARE_PROVIDER_SITE_OTHER): Payer: MEDICAID | Admitting: Clinical

## 2022-12-18 DIAGNOSIS — F909 Attention-deficit hyperactivity disorder, unspecified type: Secondary | ICD-10-CM

## 2022-12-18 DIAGNOSIS — F603 Borderline personality disorder: Secondary | ICD-10-CM

## 2022-12-18 DIAGNOSIS — Z8659 Personal history of other mental and behavioral disorders: Secondary | ICD-10-CM

## 2022-12-18 DIAGNOSIS — F330 Major depressive disorder, recurrent, mild: Secondary | ICD-10-CM | POA: Diagnosis not present

## 2022-12-18 DIAGNOSIS — F1210 Cannabis abuse, uncomplicated: Secondary | ICD-10-CM
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
## 2022-12-25 ENCOUNTER — Encounter (HOSPITAL_COMMUNITY): Payer: Self-pay | Admitting: Clinical

## 2022-12-25 ENCOUNTER — Ambulatory Visit (HOSPITAL_COMMUNITY): Payer: MEDICAID | Admitting: Clinical

## 2022-12-25 DIAGNOSIS — F909 Attention-deficit hyperactivity disorder, unspecified type: Secondary | ICD-10-CM | POA: Diagnosis not present

## 2022-12-25 DIAGNOSIS — F330 Major depressive disorder, recurrent, mild: Secondary | ICD-10-CM

## 2022-12-25 DIAGNOSIS — F1210 Cannabis abuse, uncomplicated: Secondary | ICD-10-CM | POA: Diagnosis not present

## 2022-12-25 DIAGNOSIS — F603 Borderline personality disorder: Secondary | ICD-10-CM
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
## 2023-01-01 ENCOUNTER — Ambulatory Visit (INDEPENDENT_AMBULATORY_CARE_PROVIDER_SITE_OTHER): Payer: Self-pay | Admitting: Clinical

## 2023-01-01 DIAGNOSIS — Z91199 Patient's noncompliance with other medical treatment and regimen due to unspecified reason: Secondary | ICD-10-CM

## 2023-01-01 NOTE — Progress Notes (Signed)
Patient ID: Ermila Braner, female   DOB: 15-Mar-1996, 27 y.o.   MRN: [id number]  Therapy Progress Note  Patient had an appointment scheduled with therapist on 01/01/2023  at 1:00pm.  When she did not arrive into the virtual session, CSW sent her a text to [phone]  at 1:06pm, an email to [email or web address] at 1:06pm, and a phone call/voicemail at 1:13pm.  She still did not arrive for the session at 1:30pm, so was considered a no show.  As per [facility] policy, she will be be charged for this no-show appointment.    Encounter Diagnosis  Name Primary?   No-show for appointment Yes     Ambrose Mantle, LCSW 01/01/2023, 1:32 PM

## 2023-01-02 NOTE — Progress Notes (Signed)
BH MD/PA/NP OP Progress Note  01/03/2023 12:21 PM Colleen Valentine  MRN:  [id number]  Visit Diagnosis:    ICD-10-CM   1. Borderline personality disorder (HCC)  F60.3 Oxcarbazepine (TRILEPTAL) 300 MG tablet    traZODone (DESYREL) 100 MG tablet    vortioxetine HBr (TRINTELLIX) 10 MG TABS tablet    2. Mild episode of recurrent major depressive disorder (HCC)  F33.0 traZODone (DESYREL) 100 MG tablet    vortioxetine HBr (TRINTELLIX) 10 MG TABS tablet     Assessment: Colleen Valentine is a 27 y.o. female with a history of oppositional defiant disorder, ADHD, anxiety, depression, borderline personality disorder, PTSD, and marijuana use disorder. who presented to [facility] at [facility] for initial evaluation on 06/03/2022.    During initial evaluation patient reported symptoms of mood lability including depressed mood, irritability, anxiety, and elevated mood.  She also endorsed symptoms of impulsive behaviors, chronic feelings of emptiness, emotional instability, real or perceived fear of abandonment, dissociative symptoms, and a past history of trauma.  Of note patient did endorse a history of nonsuicidal self-injury and poor sense of self that had improved after connecting with CBT/DBT groups.  Based on patient's symptoms and her past history of borderline personality disorder it does appear she is having reoccurrence of the symptoms and would benefit from restarting on medications and reconnecting with therapy/groups.  Colleen Valentine presents for follow-up evaluation. Today, 01/03/23, patient reports that mood and anxiety symptoms have been improving and are currently better controlled.  She was able to identify that it was a situational stressor which led to the prior decline.  Patient does endorse benefit from the increase in Trintellix and has tolerated the medication well.  She has been working with her therapist on strategies to take her medication consistently and dosing schedules were  reviewed today.  We also discussed patient's concern for ADHD.  She endorsed a prior diagnosis as a child and describes some symptoms consistent with that today.  We discussed the need for neuropsych testing and will refer patient today.  She will also work to obtain prior records for review.  We will continue on her current regimen and follow-up in 2 months.  Plan: - Continue Trileptal 300 mg BID - Continue Trintellix 10 mg QD - Continue hydroxyzine 25 mg TID prn for anxiety - Continue trazodone 100 QHS prn for insomnia - Continue therapy with [facility] for therapy.  [facility] [employer] [facility] information provided - Neuropsych testing referral for ADHD - CMP, CBC, iron panel, Vit B12, TSH, A1c reviewed - EKG from 02/12/22 reviewed QTC 398 - Crisis resources reviewed - Follow up in 4-6 weeks   Chief Complaint:  Chief Complaint  Patient presents with   Follow-up   HPI: On presentation today Colleen Valentine reports that not her anxiety and depression have been doing better. Colleen Valentine thinks there was a lot related to how she did poorly in school during her summer semester. Over time by pushing herself she felt that she has moved past that. She has found the medication has been helpful for this as well. She has been taking her medication consistently and also identifies the increase in Trintellix is beneficial for managing her anxiety.  Patient denies any adverse side effects other than some initial nausea from the medication that resolved after a few days.  She does experience nausea when she takes the Trileptal without food, but sometimes she does not eat until midday which results in her taking the medication late.  Patient had mentioned  to her therapist that this is part of the reason for her poor compliance as she was worried about taking the medication doses too close to each other.  We reviewed this and she denies feeling over sedated when she takes the medications at 2 PM and at 8 PM.  While we recommended  trying to keep the dosing 12 hours apart if that was not feasible and still preferable for her to take the medication as long as the interval between doses is 6 hours apart.  Colleen Valentine also notes that after speaking with her therapist she is thinking that her ADHD has been impacting her life. She thinks that her anger and overstimulation comes from that. Colleen Valentine feels that can get hyper focused on tasks while at other times she can get easily distracted. She reports that her mind is constantly racing and it is hard to organize her thoughts effectively. Sometimes it can be hard to focus and she can be overstimulated easily. This is observed especially with her kids when she has to complete multiple tasks at once.  Colleen Valentine believes that she was diagnosed as a kid sometime between 2014 and 2017.  She believes that the assessment was done through her former provider at [facility].  Patient does not have the records but will try to obtain them.  We did review that we would recommend repeating neuropsych testing however would be interested in seeing her prior results.  For now we discussed holding off medication changes though can consider stimulant or nonstimulant options pending neuropsych testing in her obtaining her former records.  Patient was in agreement with this plan.  Past Psychiatric History: She has a past psychiatric history of oppositional defiant disorder, ADHD, anxiety, depression, borderline personality disorder, PTSD, Schizoaffective disorder, and marijuana use disorder.  At 16 she was in psychiatric residential facility following a suicide attempt after her mom passed. Learned CBT and DBT there that she carried with her and had helped since.  Most notably the cognitive re framing.  She has seen a therapist intermittently in the past  Medications- Pristiq, Zoloft, Paxil, Prozac, Lexapro, Wellbutrin, Cymbalta (palpitations), BuSpar, trazodone, lamictal, seroquel, hydroxyzine, ativan, and gabapentin.  She  endorses daily marijuana want to use to help with anxiety symptoms.  She does note that it negatively impacts her concentration and increases her appetite.  Risk of marijuana use were discussed and adverse effects on anxiety were explained. Alcohol social use of one drink every couple weeks Cocaine use as a teenager   Past Medical History:  Past Medical History:  Diagnosis Date   Acute pyelonephritis 05/17/2018   Anemia    Anxiety    Complication of anesthesia    Depression    GERD (gastroesophageal reflux disease)    Herpes genitalia    Insomnia    Obesity    Polysubstance abuse (HCC) 04/01/2011   PONV (postoperative nausea and vomiting)    Seizures (HCC)    once in 2016 due to alcohol poisioning   Suicide [facility])     Past Surgical History:  Procedure Laterality Date   CESAREAN SECTION N/A 08/30/2020   Procedure: CESAREAN SECTION;  Surgeon: Kathrynn Running, MD;  Location: [facility] LD ORS;  Service: Obstetrics;  Laterality: N/A;   CHOLECYSTECTOMY  2015   COSMETIC SURGERY  Age 63   dog bite to face   IR FLUORO GUIDED NEEDLE PLC ASPIRATION/INJECTION LOC  08/22/2020   IR US GUIDE BX ASP/DRAIN  08/22/2020   LAMINECTOMY N/A 08/22/2020   Procedure: THORACIC  TWELVE TO LUMBAR ONELAMINECTOMY FOR DRAINAGE OF EPIDURAL ABSCESS;  Surgeon: Barnett Abu, MD;  Location: [facility] OR;  Service: Neurosurgery;  Laterality: N/A;   Family History:  Family History  Problem Relation Age of Onset   Alcohol abuse Mother    Stroke Mother    Bipolar disorder Mother    Drug abuse Mother    Alcohol abuse Father    Bipolar disorder Father    Drug abuse Father    Diabetes Father    Heart failure Father    Cancer Maternal Aunt        "stomach cancer"   Diabetes Maternal Aunt    Crohn's disease Paternal Aunt    Crohn's disease Paternal Aunt    Crohn's disease Paternal Uncle    Colon cancer Paternal Uncle    Breast cancer Maternal Grandmother    Colon cancer Maternal Grandfather    Diabetes type II Paternal  Grandfather    Heart failure Paternal Grandfather    Celiac disease Neg Hx    Cervical cancer Neg Hx    Lung cancer Neg Hx     Social History:  Social History   Socioeconomic History   Marital status: Single    Spouse name: Not on file   Number of children: 2   Years of education: Not on file   Highest education level: GED or equivalent  Occupational History   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    Comment:    Tobacco Use   Smoking status: Former    Current packs/day: 0.00    Average packs/day: 0.5 packs/day for 5.0 years (2.5 ttl pk-yrs)    Types: Cigarettes    Start date: 11/24/2014    Quit date: 11/24/2019    Years since quitting: 3.1   Smokeless tobacco: Never   Tobacco comments:    one pack cigarettes daily  Vaping Use   Vaping status: Every Day   Start date: 08/26/2019  Substance and Sexual Activity   Alcohol use: Yes    Comment: social   Drug use: Yes    Types: Marijuana    Comment: every other day   Sexual activity: Yes    Partners: Male    Birth control/protection: Other-see comments    Comment: female partner  Other Topics Concern   Not on file  Social History Narrative   Lives with her dad and her two children, will be going to [employer] in the spring. Works ar [employer].    Social Determinants of Health   Financial Resource Strain: Low Risk  (09/06/2021)   Overall Financial Resource Strain (CARDIA)    Difficulty of Paying Living Expenses: Not hard at all  Food Insecurity: No Food Insecurity (09/06/2021)   Hunger Vital Sign    Worried About Running Out of Food in the Last Year: Never true    Ran Out of Food in the Last Year: Never true  Transportation Needs: No Transportation Needs (09/06/2021)   PRAPARE - Administrator, Civil Service (Medical): No    Lack of Transportation (Non-Medical): No  Physical Activity: Insufficiently Active (09/06/2021)   Exercise Vital Sign    Days of Exercise per Week: 3 days     Minutes of Exercise per Session: 40 min  Stress: Stress Concern Present (09/06/2021)   [employer] of Occupational Health - Occupational Stress Questionnaire    Feeling of Stress : Very much  Social Connections: Socially Isolated (09/06/2021)   Social Connection and Isolation Panel [NHANES]  Frequency of Communication with Friends and Family: Three times a week    Frequency of Social Gatherings with Friends and Family: Twice a week    Attends Religious Services: Never    Database administrator or Organizations: No    Attends Engineer, structural: Never    Marital Status: Never married    Allergies: No Known Allergies  Current Medications: Current Outpatient Medications  Medication Sig Dispense Refill   hydrOXYzine (ATARAX) 25 MG tablet Take 1 tablet (25 mg total) by mouth 3 (three) times daily as needed for anxiety. 90 tablet 2   ibuprofen (ADVIL) 800 MG tablet Take 800 mg by mouth every 8 (eight) hours as needed.     ondansetron (ZOFRAN-ODT) 4 MG disintegrating tablet Take 1 tablet (4 mg total) by mouth every 8 (eight) hours as needed for nausea or vomiting. 30 tablet 1   Oxcarbazepine (TRILEPTAL) 300 MG tablet Take 1 tablet (300 mg total) by mouth 2 (two) times daily. 60 tablet 1   pantoprazole (PROTONIX) 40 MG tablet Take 1 tablet (40 mg total) by mouth daily. (Patient not taking: Reported on 10/23/2022) 30 tablet 3   traZODone (DESYREL) 100 MG tablet Take 1 tablet (100 mg total) by mouth at bedtime as needed for sleep (insomnia). 30 tablet 1   valACYclovir (VALTREX) 500 MG tablet Take 500 mg by mouth 2 (two) times daily.     vortioxetine HBr (TRINTELLIX) 10 MG TABS tablet Take 1 tablet (10 mg total) by mouth daily. 30 tablet 1   No current facility-administered medications for this visit.     Musculoskeletal: Strength & Muscle Tone: within normal limits Gait & Station: normal Patient leans: N/A  Psychiatric Specialty Exam: Review of Systems  There were no  vitals taken for this visit.There is no height or weight on file to calculate BMI.  General Appearance: Fairly Groomed  Eye Contact:  Good  Speech:  Clear and Coherent and Normal Rate  Volume:  Normal  Mood:  Euthymic  Affect:  Congruent  Thought Process:  Coherent, Goal Directed, and Linear  Orientation:  Full (Time, Place, and Person)  Thought Content: Logical   Suicidal Thoughts:  No  Homicidal Thoughts:  No  Memory:  NA  Judgement:  Fair  Insight:  Fair  Psychomotor Activity:  Normal  Concentration:  Concentration: Fair  Recall:  Good  Fund of Knowledge: Good  Language: Good  Akathisia:  NA    AIMS (if indicated): not done  Assets:  Communication Skills Desire for Improvement Housing Talents/Skills Transportation Vocational/Educational  ADL's:  Intact  Cognition: WNL  Sleep:  Good   Metabolic Disorder Labs: Lab Results  Component Value Date   HGBA1C 5.1 02/12/2022   MPG 97 07/21/2012   MPG 94 04/01/2011   No results found for: "PROLACTIN" Lab Results  Component Value Date   CHOL 164 07/09/2019   TRIG 199 (H) 07/09/2019   HDL 38 (L) 07/09/2019   CHOLHDL 4.3 07/09/2019   VLDL 12 07/21/2012   LDLCALC 92 07/09/2019   LDLCALC 68 12/16/2015   Lab Results  Component Value Date   TSH 1.700 02/12/2022   TSH 1.030 07/09/2019    Therapeutic Level Labs: No results found for: "LITHIUM" No results found for: "VALPROATE" No results found for: "CBMZ"   Screenings: AUDIT    Flowsheet Row ED to Hosp-Ad[facility] ([facility]  Alcohol Use Disorder Identification Test Final Score (AUDIT) 0  GAD-7    Flowsheet Row Office Visit from 10/23/2022 in [facility] Office Visit from 06/03/2022 in [facility] Visit from 02/12/2022 in [facility] Visit from 01/03/2022 in [facility] Office Visit from 09/06/2021 in [facility]  Total GAD-7 Score 20 19 21 21 19       PHQ2-9    Flowsheet Row Office Visit from 10/23/2022 in [facility] Office Visit from 06/03/2022 in [facility] Office Visit from 02/12/2022 in [facility] Office Visit from 01/03/2022 in [facility] Office Visit from 09/06/2021 in [facility]  PHQ-2 Total Score 3 3 2 5 4   PHQ-9 Total Score 15 13 11 24 20       Flowsheet Row ED from 07/24/2022 in [facility] Visit from 06/03/2022 in [facility] from 01/17/2022 in [facility] Emergency Department at [facility]  C-SSRS RISK CATEGORY No Risk No Risk No Risk       Collaboration of Care: Collaboration of Care: Medication Management AEB medication prescription and Referral or follow-up with counselor/therapist AEB chart review  Patient/Guardian was advised Release of Information must be obtained prior to any record release in order to collaborate their care with an outside provider. Patient/Guardian was advised if they have not already done so to contact the registration department to sign all necessary forms in order for [place] to release information regarding their care.   Consent: Patient/Guardian gives verbal consent for treatment and assignment of benefits for services provided during this visit. Patient/Guardian expressed understanding and agreed to proceed.    Virtual Visit via Video Note  I connected with Colleen Valentine on 11/29/22 at 10:30 AM EDT by a video enabled telemedicine application and verified that I am speaking with the correct person using two identifiers.  Location: Patient: Home Provider: Home Office   I  discussed the limitations of evaluation and management by telemedicine and the availability of in person appointments. The patient expressed understanding and agreed to proceed.   I discussed the assessment and treatment plan with the patient. The patient was provided an opportunity to ask questions and all were answered. The patient agreed with the plan and demonstrated an understanding of the instructions.   The patient was advised to call back or seek an in-person evaluation if the symptoms worsen or if the condition fails to improve as anticipated.  I provided 20 minutes of non-face-to-face time during this encounter  Stasia Cavalier, MD 01/03/2023, 12:21 PM

## 2023-01-03 ENCOUNTER — Telehealth (HOSPITAL_BASED_OUTPATIENT_CLINIC_OR_DEPARTMENT_OTHER): Payer: No Typology Code available for payment source | Admitting: Psychiatry

## 2023-01-03 ENCOUNTER — Encounter (HOSPITAL_COMMUNITY): Payer: Self-pay | Admitting: Psychiatry

## 2023-01-03 DIAGNOSIS — F603 Borderline personality disorder: Secondary | ICD-10-CM | POA: Diagnosis not present

## 2023-01-03 DIAGNOSIS — F330 Major depressive disorder, recurrent, mild: Secondary | ICD-10-CM

## 2023-01-03 DIAGNOSIS — F419 Anxiety disorder, unspecified: Secondary | ICD-10-CM | POA: Diagnosis not present

## 2023-01-03 DIAGNOSIS — G4700 Insomnia, unspecified: Secondary | ICD-10-CM | POA: Diagnosis not present

## 2023-01-03 MED ORDER — OXCARBAZEPINE 300 MG PO TABS
300.0000 mg | ORAL_TABLET | Freq: Two times a day (BID) | ORAL | 1 refills | Status: DC
Start: 2023-01-03 — End: 2023-03-11

## 2023-01-03 MED ORDER — VORTIOXETINE HBR 10 MG PO TABS: 10.0000 mg | ORAL_TABLET | Freq: Every day | ORAL | 1 refills | Status: DC

## 2023-01-03 MED ORDER — TRAZODONE HCL 100 MG PO TABS
100.0000 mg | ORAL_TABLET | Freq: Every evening | ORAL | 1 refills | Status: DC | PRN
Start: 2023-01-03 — End: 2023-03-11

## 2023-01-04 IMAGING — DX DG CHEST 2V
2 series · 2 of 2 positions shown · non-contrast
Comparison: 08/21/2020

CLINICAL DATA: Chest pain

EXAM:
CHEST - 2 VIEW

[chest lat]
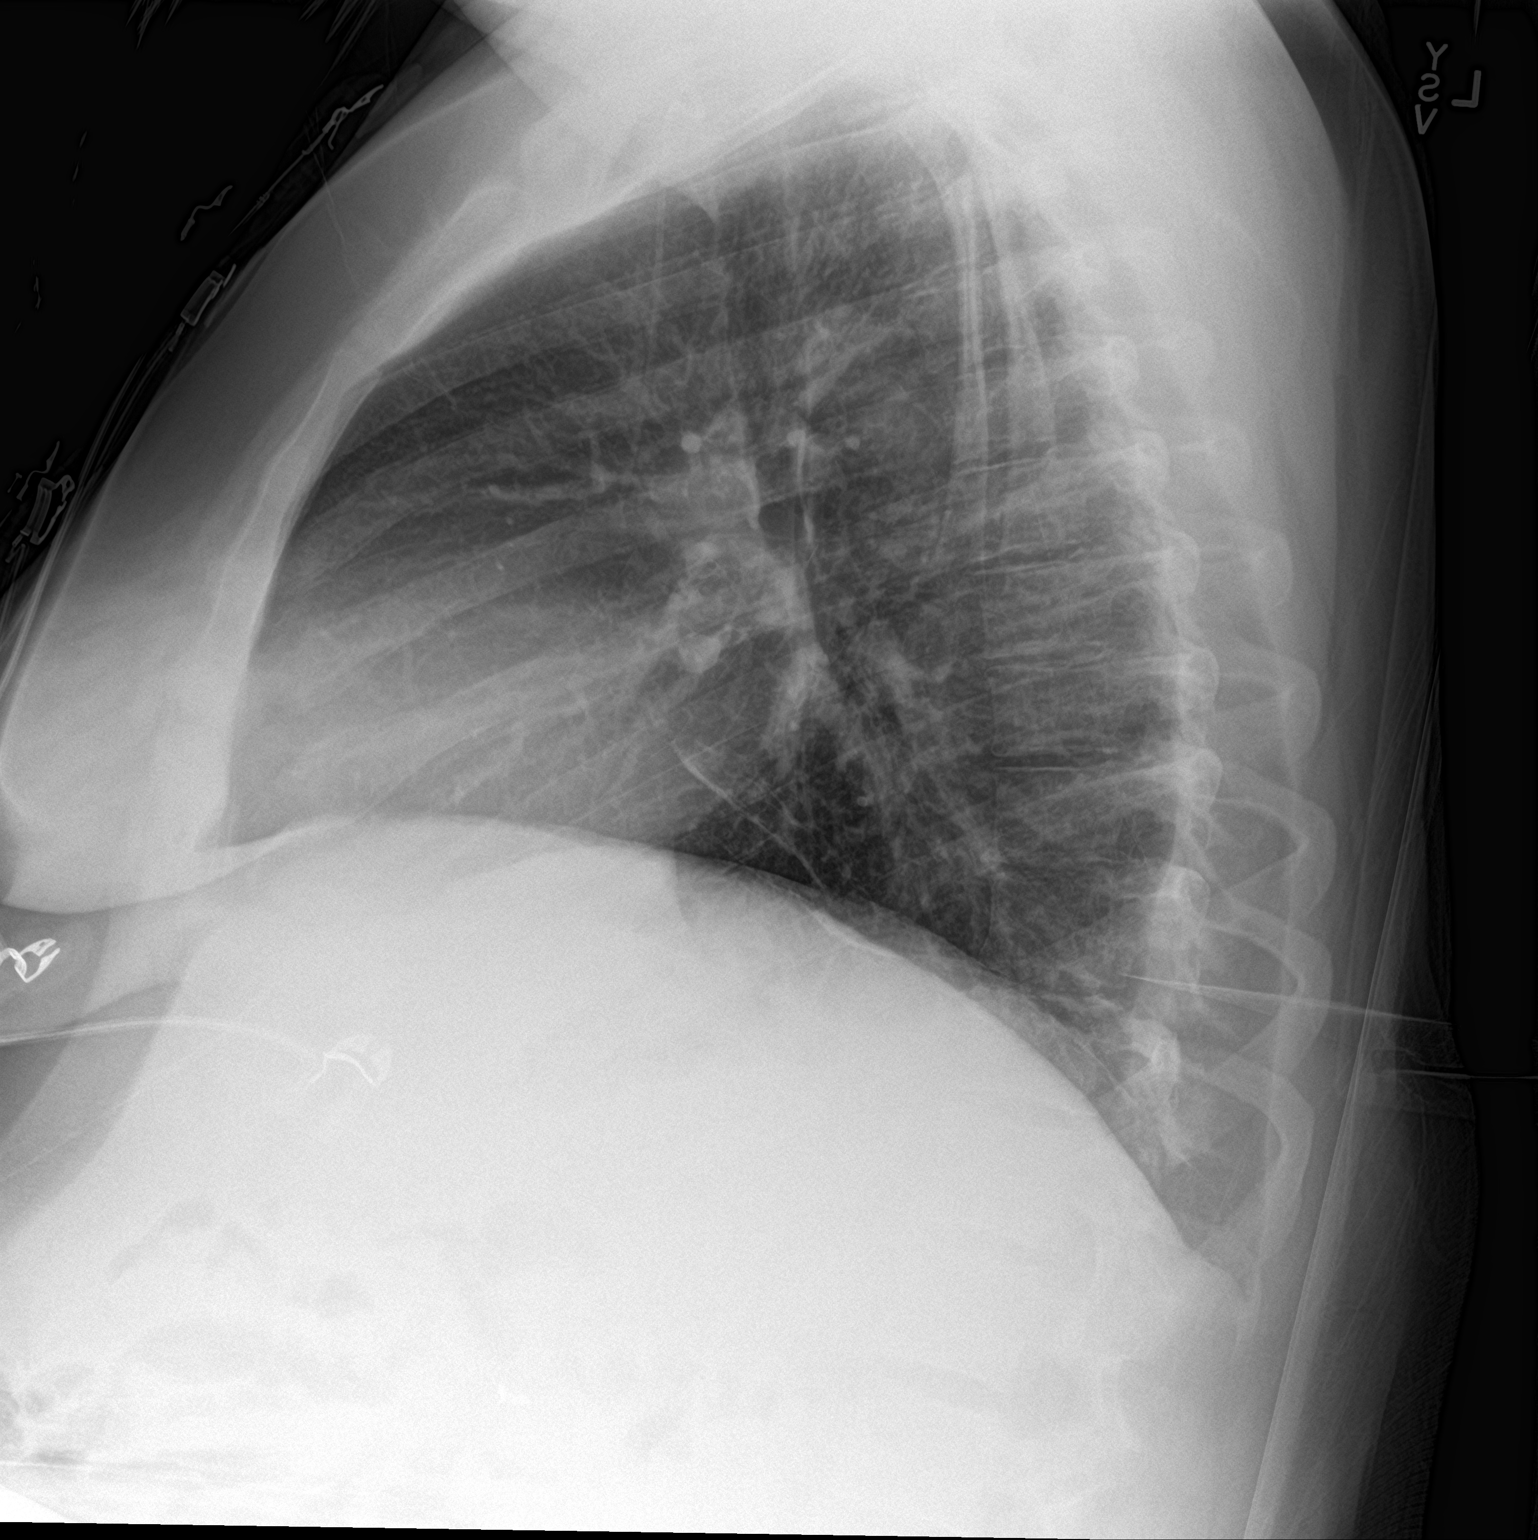

[chest ap]
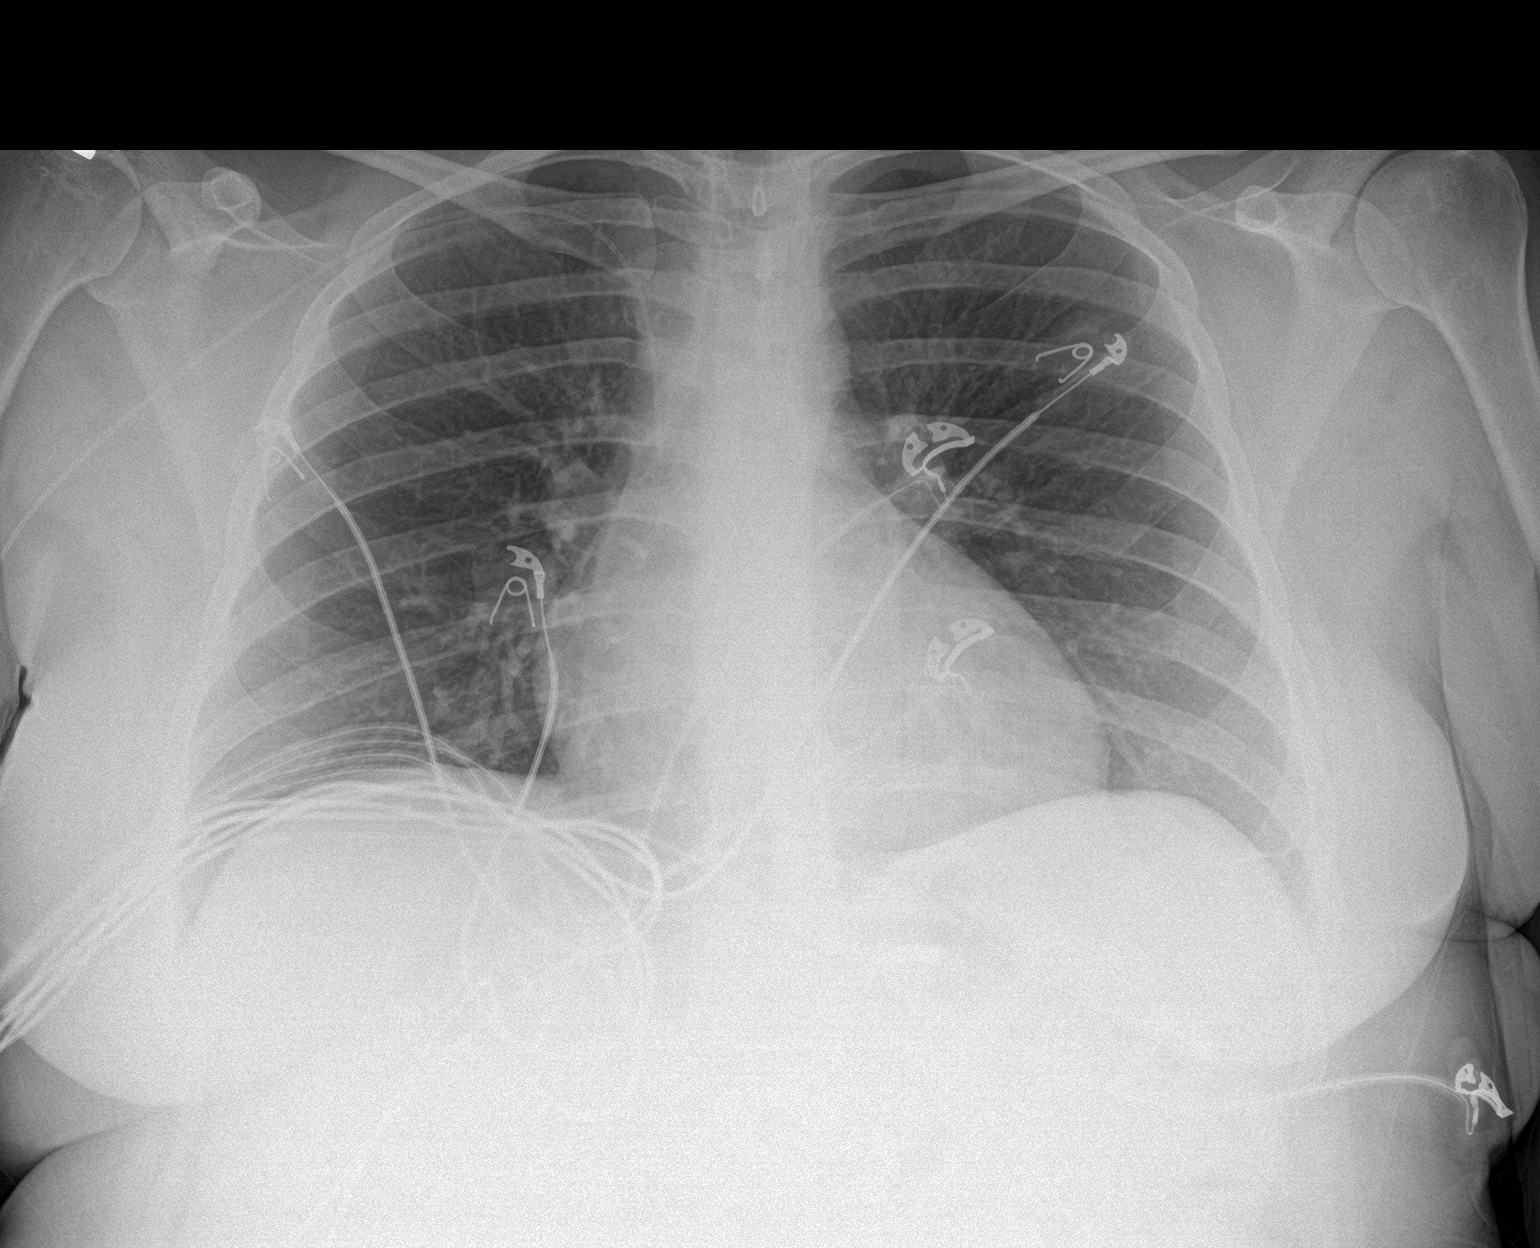

[2 of 2 positions shown; findings below may reference images not displayed]

FINDINGS: The heart size and mediastinal contours are within normal limits.
Both lungs are clear. The visualized skeletal structures are
unremarkable.
IMPRESSION: No active cardiopulmonary disease.

## 2023-01-07 ENCOUNTER — Ambulatory Visit: Payer: Medicaid Other | Admitting: Family

## 2023-01-08 ENCOUNTER — Ambulatory Visit (HOSPITAL_COMMUNITY): Payer: MEDICAID | Admitting: Clinical

## 2023-01-08 ENCOUNTER — Encounter (HOSPITAL_COMMUNITY): Payer: Self-pay | Admitting: Clinical

## 2023-01-08 DIAGNOSIS — F1210 Cannabis abuse, uncomplicated: Secondary | ICD-10-CM | POA: Diagnosis not present

## 2023-01-08 DIAGNOSIS — F330 Major depressive disorder, recurrent, mild: Secondary | ICD-10-CM | POA: Diagnosis not present

## 2023-01-08 DIAGNOSIS — F603 Borderline personality disorder: Secondary | ICD-10-CM | POA: Diagnosis not present

## 2023-01-08 NOTE — Progress Notes (Unsigned)
THERAPIST PROGRESS NOTE  Session Time: 2:00pm-2:30pm  Session #10  Virtual Visit via Video Note  I connected with [name] on 01/08/23 at  2:00 PM EDT by a video enabled telemedicine application and verified that I am speaking with the correct person using two identifiers.  Location: Patient: home Provider: [facility]   I discussed the limitations of evaluation and management by telemedicine and the availability of in person appointments. The patient expressed understanding and agreed to proceed.  I discussed the assessment and treatment plan with the patient. The patient was provided an opportunity to ask questions and all were answered. The patient agreed with the plan and demonstrated an understanding of the instructions.   The patient was advised to call back or seek an in-person evaluation if the symptoms worsen or if the condition fails to improve as anticipated.  I provided 30 minutes of non-face-to-face time during this encounter.  Lynnell Chad, LCSW   Participation Level: Active  Behavioral Response: Casual Alert Euthymic  Type of Therapy: Individual Therapy  Treatment Goals addressed:  LTG: Increase coping skills to manage depression and improve ability to perform daily activities  STG: Reduce overall depression score to no more than 9 on the Patient Health Questionnaire (PHQ-9)  LTG: Recall traumatic events without becoming overwhelmed with negative emotions  STG:  Betul will practice emotion regulation skills 5 time(s) per week for the next 26 week(s)  LTG: Verbalize the situations that can easily trigger feelings of fear, depression, and anger  STG: Verbalize the impact of childhood experiences of abuse, neglect and abandonment on current feelings and relationships  LTG: Suah will be able to list her 5 top reasons to become abstinent of marijuana and other mind-altering substances  STG: Onetha will  develop a Plan of Action regarding her desire to stop smoking marijuana   ProgressTowards Goals: Progressing  Interventions: CBT, Supportive, and Other: substance abuse counseling  Summary: Deonta Haverty is a 27 y.o. female who presents with Borderline Personality Disorder, depression, PTSD, and cannabis use disorder.  She was in a good mood, was more relaxed than usual, was sitting at a table or desk rather than lying down in bed, and had on make-up/hair done.  She reported she has been using the coping skills we have been discussing and it is helping her to develop a different perspective.  She has been having some meditative times where she will just allow thoughts to come and then float on by without judging herself.  She is taking time to think prior to having reactions.  She is taking her medication like she is supposed to now that the doctor has told her there is not a problem with forgetting it and taking it later without overtaking it.  She talked to the doctor about the medication concern as well as her ADHD and feels good about the direction of that conversation.  She is going to try to get in touch with [facility] to see if they have records of testing her for ADHD, but was able to agree when CSW suggested that it is not likely they did the full testing.  She would have to go to [facility] for full testing, with a wait of 10-12 months.  That is okay with her since what she is doing so far is working on the things bothering her most.    She did report that even during this session she has 40 other things going on in  her brain at the same time.  When asked about her progress in setting dates to gradually discontinue THC, she stated she realized that was making her feel overwhelmed so she is trying to just be mindful of when she smokes, when she feels she needs it, and only using it at those times rather than using it automatically.  Yesterday she smoked 3 times and today she has not smoked at  all.  She stated she does not smoke in front of her kids and she wants to be present with them and in her life, so that is her motivation.  Just noticing and letting go when it is not needed is actually  how she stopped smoking cigarettes.  She reported that she smoked 2 packs daily from age [age]yo.  She wants to be productive, wants to be present and more clear-headed, and wants to pass a drug test for school or an employer.  She states that she also can see now how it alters her brain.  She added that [facility] has helped her not think about things at times; however, "Ignorance is not always bliss."  Therefore she wants to know and face reality.  We talked about scheduling worry time.  She has been using her nighttime shower routine for that activity successfully.  She stated as the water comes down she worries, then she soaps up, and when she rinses off the soap, she thinks of the worries as washing away too.  We processed how this works and the fact that it does work.  She feels like she wants to keep her therapy sessions at weekly for now.  Suicidal/Homicidal: No  Therapist Response: Patient is progressing AEB engaging in scheduled therapy session.  She presented oriented x5 and stated she was feeling "really good."  CSW evaluated patient's medication compliance and self-care since last session.  CSW reviewed the last session with patient who reported she has been using all the coping skills she has been learning over the last month and feels good about the progress she is making.  Throughout the session, CSW gave patient the opportunity to explore thoughts and feelings associated with current life situations and past/present external stressors.   CSW encouraged patient's expression of feelings and validated patient's thoughts using empathy, active listening, open body language, and unconditional positive regard.      Recommendations:  Return to therapy in 2 weeks, engage in self care behaviors, continue to  work on watching thoughts drift away instead of getting fixated on them, continue to work on worry time and on [facility] reduction  Plan: Return to therapy on 8/7  Diagnosis: Borderline personality disorder (HCC)  Mild episode of recurrent major depressive disorder (HCC)  Cannabis use disorder, mild, abuse   Collaboration of Care: Psychiatrist AEB - doctor and therapist can see notes in Epic  Patient/Guardian was advised Release of Information must be obtained prior to any record release in order to collaborate their care with an outside provider. Patient/Guardian was advised if they have not already done so to contact the registration department to sign all necessary forms in order for [place] to release information regarding their care.   Consent: Patient/Guardian gives verbal consent for treatment and assignment of benefits for services provided during this visit. Patient/Guardian expressed understanding and agreed to proceed.   Lynnell Chad, LCSW 01/08/2023

## 2023-01-17 ENCOUNTER — Encounter: Payer: Self-pay | Admitting: Family

## 2023-01-31 ENCOUNTER — Encounter (HOSPITAL_COMMUNITY): Payer: Self-pay | Admitting: Clinical

## 2023-01-31 ENCOUNTER — Ambulatory Visit (INDEPENDENT_AMBULATORY_CARE_PROVIDER_SITE_OTHER): Payer: MEDICAID | Admitting: Clinical

## 2023-01-31 DIAGNOSIS — F603 Borderline personality disorder: Secondary | ICD-10-CM

## 2023-01-31 DIAGNOSIS — F330 Major depressive disorder, recurrent, mild: Secondary | ICD-10-CM | POA: Diagnosis not present

## 2023-01-31 DIAGNOSIS — F1210 Cannabis abuse, uncomplicated: Secondary | ICD-10-CM | POA: Diagnosis not present

## 2023-01-31 DIAGNOSIS — Z8659 Personal history of other mental and behavioral disorders: Secondary | ICD-10-CM

## 2023-01-31 DIAGNOSIS — F909 Attention-deficit hyperactivity disorder, unspecified type: Secondary | ICD-10-CM
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
## 2023-02-13 ENCOUNTER — Ambulatory Visit (INDEPENDENT_AMBULATORY_CARE_PROVIDER_SITE_OTHER): Payer: MEDICAID | Admitting: Clinical

## 2023-02-13 DIAGNOSIS — Z91198 Patient's noncompliance with other medical treatment and regimen for other reason: Secondary | ICD-10-CM

## 2023-02-13 NOTE — Progress Notes (Signed)
Patient ID: Colleen Valentine, female   DOB: 09-16-1995, 27 y.o.   MRN: [id number]  Therapy Progress Note  Patient had an appointment scheduled with therapist on 02/13/2023  at 2:00pm.  When she did not arrive into the virtual session, CSW sent her a text to [phone] at 2:02pm and a second text at 2:11pm.  She still did not arrive for the session at 2:16pm, so CSW phoned her.  She stated she had an injured child and was on the way to Urgent Care.  She was informed there will be no charge for this missed appointment since there was a reason.    Encounter Diagnosis  Name Primary?   Failure to attend appointment with reason given Yes     Ambrose Mantle, LCSW 02/13/2023, 2:20 PM

## 2023-02-20 ENCOUNTER — Ambulatory Visit (HOSPITAL_COMMUNITY): Payer: MEDICAID | Admitting: Clinical

## 2023-02-26 ENCOUNTER — Encounter (HOSPITAL_COMMUNITY): Payer: Self-pay | Admitting: Clinical

## 2023-02-26 ENCOUNTER — Ambulatory Visit (HOSPITAL_COMMUNITY): Payer: MEDICAID | Admitting: Clinical

## 2023-02-26 DIAGNOSIS — F1210 Cannabis abuse, uncomplicated: Secondary | ICD-10-CM | POA: Diagnosis not present

## 2023-02-26 DIAGNOSIS — Z8659 Personal history of other mental and behavioral disorders: Secondary | ICD-10-CM

## 2023-02-26 DIAGNOSIS — F909 Attention-deficit hyperactivity disorder, unspecified type: Secondary | ICD-10-CM

## 2023-02-26 DIAGNOSIS — F603 Borderline personality disorder: Secondary | ICD-10-CM | POA: Diagnosis not present

## 2023-02-26 DIAGNOSIS — F330 Major depressive disorder, recurrent, mild: Secondary | ICD-10-CM
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
## 2023-03-05 ENCOUNTER — Ambulatory Visit (HOSPITAL_COMMUNITY): Payer: MEDICAID | Admitting: Clinical

## 2023-03-05 ENCOUNTER — Encounter (HOSPITAL_COMMUNITY): Payer: Self-pay | Admitting: Clinical

## 2023-03-05 DIAGNOSIS — F603 Borderline personality disorder: Secondary | ICD-10-CM

## 2023-03-05 DIAGNOSIS — F1211 Cannabis abuse, in remission: Secondary | ICD-10-CM

## 2023-03-05 DIAGNOSIS — F909 Attention-deficit hyperactivity disorder, unspecified type: Secondary | ICD-10-CM

## 2023-03-05 DIAGNOSIS — Z8659 Personal history of other mental and behavioral disorders: Secondary | ICD-10-CM

## 2023-03-05 DIAGNOSIS — F330 Major depressive disorder, recurrent, mild: Secondary | ICD-10-CM | POA: Diagnosis not present

## 2023-03-05 NOTE — Progress Notes (Signed)
THERAPIST PROGRESS NOTE  Session Time: 1:03-1:30pm  Session #13  Virtual Visit via Video Note  I connected with Colleen Valentine on 03/05/23 at  1:00 PM EDT by a video enabled telemedicine application and verified that I am speaking with the correct person using two identifiers.  Location: Patient: home Provider: [facility]   I discussed the limitations of evaluation and management by telemedicine and the availability of in person appointments. The patient expressed understanding and agreed to proceed.  I discussed the assessment and treatment plan with the patient. The patient was provided an opportunity to ask questions and all were answered. The patient agreed with the plan and demonstrated an understanding of the instructions.   The patient was advised to call back or seek an in-person evaluation if the symptoms worsen or if the condition fails to improve as anticipated.  I provided 27 minutes of non-face-to-face time during this encounter.    Lynnell Chad, LCSW   Participation Level: Active  Behavioral Response: Casual Alert Euthymic  Type of Therapy: Individual Therapy  Treatment Goals addressed:     Goal: LTG: Increase coping skills to manage depression and improve ability to perform daily activities Goal: STG: Reduce overall depression score to no more than 9 on the Patient Health Questionnaire (PHQ-9) Goal: LTG: Will be able to identify 5-7 cognitive distortions she has and will learn how to come up with replacement thoughts that are more balanced, realistic, and helpful. Goal: STG: Learn coping skills and increase resilience through application of CBT techniques and through processing of life in a shame framework Goal: LTG: Recall traumatic events without becoming overwhelmed with negative emotions Goal: STG: Colleen Valentine will practice emotion regulation skills 5 time(s) per week for the next 26 week(s) Goal: LTG: Learn  breathing techniques and grounding techniques and demonstrate mastery in session then report independent use of these skills out of session. Goal: LTG: Verbalize the situations that can easily trigger feelings of fear, depression, and anger Goal: LTG: Learn emotion regulation strategies and practice using them Goal: LTG: Learn distress tolerance skills and practice using them Goal: STG: Colleen Valentine will develop a Plan of Action regarding her desire to stop smoking marijuana Goal: LTG: Colleen Valentine will continue with her journey of sobriety by remaining abstinent from marijuana by using all coping skills at her disposal Goal: STG: Colleen Valentine will learn about the harm of substances, will identify her triggers, and will create and implement a relapse prevention plan  ProgressTowards Goals: Progressing  Interventions: DBT and Supportive   Summary: Colleen Valentine is a 27 y.o. female who presents with Borderline Personality Disorder, depression, PTSD, and cannabis use disorder.  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  She reported that she has tried the DBT skill taught at last session, "Act Opposite," and found it incredibly helpful.  She realized she was already using her own version of it in her life.  There was a birthday party for her 4yo son last Saturday and such gatherings are not desirable to her, but she used "Act Opposite" frequently throughout the affair.  She was overwhelmed with anxiety, but stated she decided to "let go" which let her stay calm.    CSW taught DEAR MAN communication skill today while sharing screen to a pertinent handout.  She appreciated this a great deal and commented about therapist's access to resources.  She was informed about the website "TherapistAid"  and how to look for handouts such as those used with her and those for substance abuse.  A handout on "What is addiction?" that actually addressed her misinformation  that was corrected last session was shared to her screen.  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  She agreed to implement the DEAR MAN communication skills during her time before next session.  Suicidal/Homicidal: No  Therapist Response: Patient is progressing AEB engaging in scheduled therapy session.  She presented oriented x5 and stated she was feeling "good."  CSW evaluated patient's medication compliance, use of coping tools, and self-care, as applicable.  Patient stated things are going well and she did use "Act Opposite" with success since last session.  CSW taught DEAR MAN skill from DBT curriculum.  Patient received this willingly and indicated goodcomprehension.  CSW assigned patient homework of trying out these new communication tools.  CSW encouraged patient to schedule more therapy sessions for the future, as there is only 1 scheduled currently.  Throughout the session, CSW gave patient the opportunity to explore thoughts and feelings associated with current life situations and past/present stressors.   CSW challenged patient gently and appropriately to consider different ways of looking at reported issues. CSW encouraged patient's expression of feelings and validated these using empathy, active listening, open body language, and unconditional positive regard.         Recommendations:  Return to therapy in 1 week, engage in self care behaviors, use DEARMAN technique, check out "therapistaid" website make additional appointments  Plan: Return to therapy on 10/19  Diagnosis: Borderline personality disorder (HCC)  Mild episode of recurrent major depressive disorder (HCC)  Cannabis use disorder, mild, in early remission  Adult ADHD (attention deficit hyperactivity disorder)  History of posttraumatic stress disorder (PTSD)   Collaboration of Care: Psychiatrist AEB - doctor and therapist can see notes in  Epic  Patient/Guardian was advised Release of Information must be obtained prior to any record release in order to collaborate their care with an outside provider. Patient/Guardian was advised if they have not already done so to contact the registration department to sign all necessary forms in order for [place] to release information regarding their care.   Consent: Patient/Guardian gives verbal consent for treatment and assignment of benefits for services provided during this visit. Patient/Guardian expressed understanding and agreed to proceed.   Lynnell Chad, LCSW 03/05/2023

## 2023-03-10 NOTE — Progress Notes (Unsigned)
BH MD/PA/NP OP Progress Note  03/11/2023 2:45 PM Colleen Valentine  MRN:  [id number]  Visit Diagnosis:    ICD-10-CM   1. Borderline personality disorder (HCC)  F60.3 Oxcarbazepine (TRILEPTAL) 300 MG tablet    vortioxetine HBr (TRINTELLIX) 10 MG TABS tablet    traZODone (DESYREL) 100 MG tablet    2. Mild episode of recurrent major depressive disorder (HCC)  F33.0 vortioxetine HBr (TRINTELLIX) 10 MG TABS tablet    traZODone (DESYREL) 100 MG tablet      Assessment: Colleen Valentine is a 27 y.o. female with a history of oppositional defiant disorder, ADHD, anxiety, depression, borderline personality disorder, PTSD, and marijuana use disorder. who presented to [facility] for initial evaluation on 06/03/2022.    During initial evaluation patient reported symptoms of mood lability including depressed mood, irritability, anxiety, and elevated mood.  She also endorsed symptoms of impulsive behaviors, chronic feelings of emptiness, emotional instability, real or perceived fear of abandonment, dissociative symptoms, and a past history of trauma.  Of note patient did endorse a history of nonsuicidal self-injury and poor sense of self that had improved after connecting with CBT/DBT groups.  Based on patient's symptoms and her past history of borderline personality disorder it does appear she is having reoccurrence of the symptoms and would benefit from restarting on medications and reconnecting with therapy/groups.  Colleen Valentine presents for follow-up evaluation. Today, 03/11/23, patient reports that she has had significant improvement in both mood and anxiety symptoms in the interim.  These have occurred following her marijuana cessation almost 2 months ago.  Since then patient had improvement in concentration, mental cloudiness, motivation, and overall mood lability.  She is no longer struggling as much with irritability or anxiety she had in the past.  With this she has needed her  as needed hydroxyzine less so than in the past.  She continues to take her current medication regimen and denies any adverse side effects.  We will continue her current regimen at this time though can consider tapering medications in the future if patient remains stable.  She will continue with therapy and we will follow up in 2 months.  Plan: - Continue Trileptal 300 mg BID - Continue Trintellix 10 mg QD - Continue hydroxyzine 25 mg TID prn for anxiety - Continue trazodone 100 QHS prn for insomnia - Continue therapy with [name].  [facility] information provided - Neuropsych testing referral for ADHD - CMP, CBC, iron panel, Vit B12, TSH, A1c reviewed - EKG from 02/12/22 reviewed QTC 398 - Crisis resources reviewed - Follow up in 4-6 weeks  Chief Complaint:  Chief Complaint  Patient presents with   Follow-up   HPI: On presentation today Colleen Valentine reports that things are going really well for her recently.  Patient reports that she stopped smoking marijuana on 8/25 and has found things to be getting much better since then.  She reports being less impulsive, less irritable, less forgetful, more motivated, and not as mentally clouded as she had been in the past.  Anxiety is also improved and now she is only using the as needed hydroxyzine 1-2 times a week.  Otherwise while anxiety is still present it is well controlled by her medication and coping skills.  Patient reports that she decided to quit marijuana secondary to support from her therapist and as she is wanted to change her life after she turned 27.  She feels like without marijuana a lot more doors are open to her  compared to the past.  Medication wise we discussed continuing on her current regimen at this time.  If she continues to do well at future visits we could consider tapering her current regimen.  Past Psychiatric History: She has a past psychiatric history of oppositional defiant disorder, ADHD, anxiety, depression,  borderline personality disorder, PTSD, Schizoaffective disorder, and marijuana use disorder.  At 16 she was in psychiatric residential facility following a suicide attempt after her mom passed. Learned CBT and DBT there that she carried with her and had helped since.  Most notably the cognitive re framing.  She has seen a therapist intermittently in the past  Medications- Pristiq, Zoloft, Paxil, Prozac, Lexapro, Wellbutrin, Cymbalta (palpitations), BuSpar, trazodone, lamictal, seroquel, hydroxyzine, ativan, and gabapentin.  She endorses daily marijuana want to use to help with anxiety symptoms.  She does note that it negatively impacts her concentration and increases her appetite.  Risk of marijuana use were discussed and adverse effects on anxiety were explained. Alcohol social use of one drink every couple weeks Cocaine use as a teenager   Past Medical History:  Past Medical History:  Diagnosis Date   Acute pyelonephritis 05/17/2018   Anemia    Anxiety    Complication of anesthesia    Depression    GERD (gastroesophageal reflux disease)    Herpes genitalia    Insomnia    Obesity    Polysubstance abuse (HCC) 04/01/2011   PONV (postoperative nausea and vomiting)    Seizures (HCC)    once in 2016 due to alcohol poisioning   Suicide [facility])     Past Surgical History:  Procedure Laterality Date   CESAREAN SECTION N/A 08/30/2020   Procedure: CESAREAN SECTION;  Surgeon: Kathrynn Running, MD;  Location: [facility] LD ORS;  Service: Obstetrics;  Laterality: N/A;   CHOLECYSTECTOMY  2015   COSMETIC SURGERY  Age 60   dog bite to face   IR FLUORO GUIDED NEEDLE PLC ASPIRATION/INJECTION LOC  08/22/2020   IR US GUIDE BX ASP/DRAIN  08/22/2020   LAMINECTOMY N/A 08/22/2020   Procedure: THORACIC TWELVE TO LUMBAR ONELAMINECTOMY FOR DRAINAGE OF EPIDURAL ABSCESS;  Surgeon: Barnett Abu, MD;  Location: [facility] OR;  Service: Neurosurgery;  Laterality: N/A;   Family History:  Family History  Problem Relation Age of  Onset   Alcohol abuse Mother    Stroke Mother    Bipolar disorder Mother    Drug abuse Mother    Alcohol abuse Father    Bipolar disorder Father    Drug abuse Father    Diabetes Father    Heart failure Father    Cancer Maternal Aunt        "stomach cancer"   Diabetes Maternal Aunt    Crohn's disease Paternal Aunt    Crohn's disease Paternal Aunt    Crohn's disease Paternal Uncle    Colon cancer Paternal Uncle    Breast cancer Maternal Grandmother    Colon cancer Maternal Grandfather    Diabetes type II Paternal Grandfather    Heart failure Paternal Grandfather    Celiac disease Neg Hx    Cervical cancer Neg Hx    Lung cancer Neg Hx     Social History:  Social History   Socioeconomic History   Marital status: Single    Spouse name: Not on file   Number of children: 2   Years of education: Not on file   Highest education level: GED or equivalent  Occupational History   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    Comment:    Tobacco Use   Smoking status: Former    Current packs/day: 0.00    Average packs/day: 0.5 packs/day for 5.0 years (2.5 ttl pk-yrs)    Types: Cigarettes    Start date: 11/24/2014    Quit date: 11/24/2019    Years since quitting: 3.2   Smokeless tobacco: Never   Tobacco comments:    one pack cigarettes daily  Vaping Use   Vaping status: Every Day   Start date: 08/26/2019  Substance and Sexual Activity   Alcohol use: Yes    Comment: social   Drug use: Yes    Types: Marijuana    Comment: every other day   Sexual activity: Yes    Partners: Male    Birth control/protection: Other-see comments    Comment: female partner  Other Topics Concern   Not on file  Social History Narrative   Lives with her dad and her two children, will be going to [employer] in the spring. Works ar [employer].    Social Determinants of Health   Financial Resource Strain: Low Risk  (09/06/2021)   Overall Financial Resource Strain (CARDIA)     Difficulty of Paying Living Expenses: Not hard at all  Food Insecurity: No Food Insecurity (09/06/2021)   Hunger Vital Sign    Worried About Running Out of Food in the Last Year: Never true    Ran Out of Food in the Last Year: Never true  Transportation Needs: No Transportation Needs (09/06/2021)   PRAPARE - Administrator, Civil Service (Medical): No    Lack of Transportation (Non-Medical): No  Physical Activity: Insufficiently Active (09/06/2021)   Exercise Vital Sign    Days of Exercise per Week: 3 days    Minutes of Exercise per Session: 40 min  Stress: Stress Concern Present (09/06/2021)   [employer] of Occupational Health - Occupational Stress Questionnaire    Feeling of Stress : Very much  Social Connections: Socially Isolated (09/06/2021)   Social Connection and Isolation Panel [NHANES]    Frequency of Communication with Friends and Family: Three times a week    Frequency of Social Gatherings with Friends and Family: Twice a week    Attends Religious Services: Never    Database administrator or Organizations: No    Attends Engineer, structural: Never    Marital Status: Never married    Allergies: No Known Allergies  Current Medications: Current Outpatient Medications  Medication Sig Dispense Refill   hydrOXYzine (ATARAX) 25 MG tablet Take 1 tablet (25 mg total) by mouth 3 (three) times daily as needed for anxiety. 90 tablet 2   ibuprofen (ADVIL) 800 MG tablet Take 800 mg by mouth every 8 (eight) hours as needed.     ondansetron (ZOFRAN-ODT) 4 MG disintegrating tablet Take 1 tablet (4 mg total) by mouth every 8 (eight) hours as needed for nausea or vomiting. 30 tablet 1   Oxcarbazepine (TRILEPTAL) 300 MG tablet Take 1 tablet (300 mg total) by mouth 2 (two) times daily. 60 tablet 1   pantoprazole (PROTONIX) 40 MG tablet Take 1 tablet (40 mg total) by mouth daily. (Patient not taking: Reported on 10/23/2022) 30 tablet 3   traZODone (DESYREL) 100 MG tablet  Take 1 tablet (100 mg total) by mouth at bedtime as needed for sleep (insomnia). 30 tablet 1   valACYclovir (VALTREX) 500 MG tablet Take 500 mg by mouth 2 (two) times daily.  vortioxetine HBr (TRINTELLIX) 10 MG TABS tablet Take 1 tablet (10 mg total) by mouth daily. 30 tablet 1   No current facility-administered medications for this visit.     Musculoskeletal: Strength & Muscle Tone: within normal limits Gait & Station: normal Patient leans: N/A  Psychiatric Specialty Exam: Review of Systems  There were no vitals taken for this visit.There is no height or weight on file to calculate BMI.  General Appearance: Fairly Groomed  Eye Contact:  Good  Speech:  Clear and Coherent and Normal Rate  Volume:  Normal  Mood:  Euthymic  Affect:  Congruent  Thought Process:  Coherent, Goal Directed, and Linear  Orientation:  Full (Time, Place, and Person)  Thought Content: Logical   Suicidal Thoughts:  No  Homicidal Thoughts:  No  Memory:  NA  Judgement:  Good  Insight:  Fair  Psychomotor Activity:  Normal  Concentration:  Concentration: Fair  Recall:  Good  Fund of Knowledge: Good  Language: Good  Akathisia:  NA    AIMS (if indicated): not done  Assets:  Communication Skills Desire for Improvement Housing Talents/Skills Transportation Vocational/Educational  ADL's:  Intact  Cognition: WNL  Sleep:  Good   Metabolic Disorder Labs: Lab Results  Component Value Date   HGBA1C 5.1 02/12/2022   MPG 97 07/21/2012   MPG 94 04/01/2011   No results found for: "PROLACTIN" Lab Results  Component Value Date   CHOL 164 07/09/2019   TRIG 199 (H) 07/09/2019   HDL 38 (L) 07/09/2019   CHOLHDL 4.3 07/09/2019   VLDL 12 07/21/2012   LDLCALC 92 07/09/2019   LDLCALC 68 12/16/2015   Lab Results  Component Value Date   TSH 1.700 02/12/2022   TSH 1.030 07/09/2019    Therapeutic Level Labs: No results found for: "LITHIUM" No results found for: "VALPROATE" No results found for:  "CBMZ"   Screenings: AUDIT    Flowsheet Row ED to Hosp-[facility] ([facility]  Alcohol Use Disorder Identification Test Final Score (AUDIT) 0      GAD-7    Flowsheet Row Office Visit from 10/23/2022 in [facility] Office Visit from [facility]/03/2022 [facility] Office Visit from 02/12/2022 in [facility] Visit from 01[facility]03/2022 in [facility] Office Visit from 09/06/2021 in [facility]  Total GAD-7 Score 20 19 21 21 19       PHQ2-9    Flowsheet Row Office Visit from 10/23/2022 in [facility] Office Visit from 06/03/2022 in [facility] Office Visit from 02/12/2022 in [facility] Office Visit from 01/03/2022 in [facility] Office Visit from 09/06/2021 in [facility] at [facility]  PHQ-2 Total Score 3 3 2 5 4   PHQ-9 Total Score 15 13 11 24 20       Flowsheet Row ED from 07/24/2022 in [facility] Visit from 06[facility]03/2022 [facility] from 01/17/2022 in [facility] at [facility]  C-SSRS RISK CATEGORY No Risk No Risk No Risk       Collaboration of Care: Collaboration of Care: Medication Management AEB medication prescription and Referral or follow-up with counselor/therapist AEB chart review  Patient/Guardian was advised Release of Information must be  obtained prior to any record release in order to collaborate their care with an outside provider. Patient/Guardian was advised if they have not already done so to contact the registration department to sign all necessary forms in  order for [place] to release information regarding their care.   Consent: Patient/Guardian gives verbal consent for treatment and assignment of benefits for services provided during this visit. Patient/Guardian expressed understanding and agreed to proceed.    Virtual Visit via Video Note  I connected with Colleen Valentine on 11/29/22 at 10:30 AM EDT by a video enabled telemedicine application and verified that I am speaking with the correct person using two identifiers.  Location: Patient: Home Provider: Home Office   I discussed the limitations of evaluation and management by telemedicine and the availability of in person appointments. The patient expressed understanding and agreed to proceed.   I discussed the assessment and treatment plan with the patient. The patient was provided an opportunity to ask questions and all were answered. The patient agreed with the plan and demonstrated an understanding of the instructions.   The patient was advised to call back or seek an in-person evaluation if the symptoms worsen or if the condition fails to improve as anticipated.  I provided 10 minutes of non-face-to-face time during this encounter  Stasia Cavalier, MD 03/11/2023, 2:45 PM

## 2023-03-11 ENCOUNTER — Telehealth (HOSPITAL_COMMUNITY): Payer: No Typology Code available for payment source | Admitting: Psychiatry

## 2023-03-11 ENCOUNTER — Encounter (HOSPITAL_COMMUNITY): Payer: Self-pay | Admitting: Psychiatry

## 2023-03-11 DIAGNOSIS — F330 Major depressive disorder, recurrent, mild: Secondary | ICD-10-CM | POA: Diagnosis not present

## 2023-03-11 DIAGNOSIS — F603 Borderline personality disorder: Secondary | ICD-10-CM | POA: Diagnosis not present

## 2023-03-11 MED ORDER — TRAZODONE HCL 100 MG PO TABS
100.0000 mg | ORAL_TABLET | Freq: Every evening | ORAL | 1 refills | Status: DC | PRN
Start: 2023-03-11 — End: 2023-06-12

## 2023-03-11 MED ORDER — OXCARBAZEPINE 300 MG PO TABS
300.0000 mg | ORAL_TABLET | Freq: Two times a day (BID) | ORAL | 1 refills | Status: DC
Start: 2023-03-11 — End: 2023-06-12

## 2023-03-11 MED ORDER — VORTIOXETINE HBR 10 MG PO TABS
10.0000 mg | ORAL_TABLET | Freq: Every day | ORAL | 1 refills | Status: DC
Start: 2023-03-11 — End: 2023-06-12

## 2023-03-12 ENCOUNTER — Ambulatory Visit (INDEPENDENT_AMBULATORY_CARE_PROVIDER_SITE_OTHER): Payer: MEDICAID | Admitting: Clinical

## 2023-03-12 ENCOUNTER — Encounter (HOSPITAL_COMMUNITY): Payer: Self-pay | Admitting: Clinical

## 2023-03-12 DIAGNOSIS — Z91198 Patient's noncompliance with other medical treatment and regimen for other reason: Secondary | ICD-10-CM

## 2023-03-12 NOTE — Progress Notes (Signed)
THERAPIST PROGRESS NOTE  Therapy Progress Note  Patient had an appointment scheduled with therapist on 03/12/2023  at 2;00pm.  When she did not arrive into the virtual session, CSW sent her a text to [phone]  at 2:06 and a second text at 2:32.  She still did not arrive for the session at 2:37pm, so was considered a no show.  After this, CSW found a message from her stating she is in the middle of her community service and had forgotten to cancel.    Encounter Diagnosis  Name Primary?   Failure to attend appointment with reason given Yes     Ambrose Mantle, LCSW 03/12/2023, 2:47 PM

## 2023-03-13 ENCOUNTER — Telehealth (HOSPITAL_COMMUNITY): Payer: Self-pay | Admitting: Clinical

## 2023-03-31 ENCOUNTER — Ambulatory Visit: Payer: Medicaid Other | Admitting: Adult Health

## 2023-05-05 NOTE — Progress Notes (Unsigned)
BH MD/PA/NP OP Progress Note  05/05/2023 8:55 AM Sharnee Carney  MRN:  [id number]  Visit Diagnosis: No diagnosis found.  Assessment: Colleen Valentine is a 27 y.o. female with a history of oppositional defiant disorder, ADHD, anxiety, depression, borderline personality disorder, PTSD, and marijuana use disorder. who presented to [facility] at [facility] for initial evaluation on 06/03/2022.     During initial evaluation patient reported symptoms of mood lability including depressed mood, irritability, anxiety, and elevated mood.  She also endorsed symptoms of impulsive behaviors, chronic feelings of emptiness, emotional instability, real or perceived fear of abandonment, dissociative symptoms, and a past history of trauma.  Of note patient did endorse a history of nonsuicidal self-injury and poor sense of self that had improved after connecting with CBT/DBT groups.  Based on patient's symptoms and her past history of borderline personality disorder it does appear she is having reoccurrence of the symptoms and would benefit from restarting on medications and reconnecting with therapy/groups.   [name] presents for follow-up evaluation. Today, 05/05/23, patient reports ***    she has had significant improvement in both mood and anxiety symptoms in the interim.  These have occurred following her marijuana cessation almost 2 months ago.  Since then patient had improvement in concentration, mental cloudiness, motivation, and overall mood lability.  She is no longer struggling as much with irritability or anxiety she had in the past.  With this she has needed her as needed hydroxyzine less so than in the past.  She continues to take her current medication regimen and denies any adverse side effects.  We will continue her current regimen at this time though can consider tapering medications in the future if patient remains stable.  She will continue with therapy and we will follow up in 2  months.   Plan: - Continue Trileptal 300 mg BID - Continue Trintellix 10 mg QD - Continue hydroxyzine 25 mg TID prn for anxiety - Continue trazodone 100 QHS prn for insomnia - Continue therapy with [name].  [facility] information provided - Neuropsych testing referral for ADHD - CMP, CBC, iron panel, Vit B12, TSH, A1c reviewed - EKG from 02/12/22 reviewed QTC 398 - Crisis resources reviewed - Follow up in 4-6 weeks  Chief Complaint: No chief complaint on file.  HPI: On presentation today [name] reports that things are going really well for her recently.  Patient reports that she stopped smoking marijuana on 8/25 and has found things to be getting much better since then.  She reports being less impulsive, less irritable, less forgetful, more motivated, and not as mentally clouded as she had been in the past.  Anxiety is also improved and now she is only using the as needed hydroxyzine 1-2 times a week.  Otherwise while anxiety is still present it is well controlled by her medication and coping skills.  Patient reports that she decided to quit marijuana secondary to support from her therapist and as she is wanted to change her life after she turned 27.  She feels like without marijuana a lot more doors are open to her compared to the past.  Medication wise we discussed continuing on her current regimen at this time.  If she continues to do well at future visits we could consider tapering her current regimen.  Past Psychiatric History: She has a past psychiatric history of oppositional defiant disorder, ADHD, anxiety, depression, borderline personality disorder, PTSD, Schizoaffective disorder, and marijuana use disorder.  At 16 she was  in psychiatric residential facility following a suicide attempt after her mom passed. Learned CBT and DBT there that she carried with her and had helped since.  Most notably the cognitive re framing.  She has seen a therapist intermittently in the  past  Medications- Pristiq, Zoloft, Paxil, Prozac, Lexapro, Wellbutrin, Cymbalta (palpitations), BuSpar, trazodone, lamictal, seroquel, hydroxyzine, ativan, and gabapentin.  She endorses daily marijuana want to use to help with anxiety symptoms.  She does note that it negatively impacts her concentration and increases her appetite.  Risk of marijuana use were discussed and adverse effects on anxiety were explained. Alcohol social use of one drink every couple weeks Cocaine use as a teenager  Past Medical History:  Past Medical History:  Diagnosis Date   Acute pyelonephritis 05/17/2018   Anemia    Anxiety    Complication of anesthesia    Depression    GERD (gastroesophageal reflux disease)    Herpes genitalia    Insomnia    Obesity    Polysubstance abuse (HCC) 04/01/2011   PONV (postoperative nausea and vomiting)    Seizures (HCC)    once in 2016 due to alcohol poisioning   Suicide [facility])     Past Surgical History:  Procedure Laterality Date   CESAREAN SECTION N/A 08/30/2020   Procedure: CESAREAN SECTION;  Surgeon: Kathrynn Running, MD;  Location: [facility] LD ORS;  Service: Obstetrics;  Laterality: N/A;   CHOLECYSTECTOMY  2015   COSMETIC SURGERY  Age 24   dog bite to face   IR FLUORO GUIDED NEEDLE PLC ASPIRATION/INJECTION LOC  08/22/2020   IR US GUIDE BX ASP/DRAIN  08/22/2020   LAMINECTOMY N/A 08/22/2020   Procedure: THORACIC TWELVE TO LUMBAR ONELAMINECTOMY FOR DRAINAGE OF EPIDURAL ABSCESS;  Surgeon: Barnett Abu, MD;  Location: [facility] OR;  Service: Neurosurgery;  Laterality: N/A;    Family History:  Family History  Problem Relation Age of Onset   Alcohol abuse Mother    Stroke Mother    Bipolar disorder Mother    Drug abuse Mother    Alcohol abuse Father    Bipolar disorder Father    Drug abuse Father    Diabetes Father    Heart failure Father    Cancer Maternal Aunt        "stomach cancer"   Diabetes Maternal Aunt    Crohn's disease Paternal Aunt    Crohn's disease Paternal  Aunt    Crohn's disease Paternal Uncle    Colon cancer Paternal Uncle    Breast cancer Maternal Grandmother    Colon cancer Maternal Grandfather    Diabetes type II Paternal Grandfather    Heart failure Paternal Grandfather    Celiac disease Neg Hx    Cervical cancer Neg Hx    Lung cancer Neg Hx     Social History:  Social History   Socioeconomic History   Marital status: Single    Spouse name: Not on file   Number of children: 2   Years of education: Not on file   Highest education level: GED or equivalent  Occupational History   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    Comment:    Tobacco Use   Smoking status: Former    Current packs/day: 0.00    Average packs/day: 0.5 packs/day for 5.0 years (2.5 ttl pk-yrs)    Types: Cigarettes    Start date: 11/24/2014    Quit date: 11/24/2019    Years since quitting: 3.4   Smokeless tobacco: Never   Tobacco  comments:    one pack cigarettes daily  Vaping Use   Vaping status: Every Day   Start date: 08/26/2019  Substance and Sexual Activity   Alcohol use: Yes    Comment: social   Drug use: Yes    Types: Marijuana    Comment: every other day   Sexual activity: Yes    Partners: Male    Birth control/protection: Other-see comments    Comment: female partner  Other Topics Concern   Not on file  Social History Narrative   Lives with her dad and her two children, will be going to [employer] in the spring. Works ar [employer].    Social Determinants of Health   Financial Resource Strain: Low Risk  (09/06/2021)   Overall Financial Resource Strain (CARDIA)    Difficulty of Paying Living Expenses: Not hard at all  Food Insecurity: No Food Insecurity (09/06/2021)   Hunger Vital Sign    Worried About Running Out of Food in the Last Year: Never true    Ran Out of Food in the Last Year: Never true  Transportation Needs: No Transportation Needs (09/06/2021)   PRAPARE - Administrator, Civil Service  (Medical): No    Lack of Transportation (Non-Medical): No  Physical Activity: Insufficiently Active (09/06/2021)   Exercise Vital Sign    Days of Exercise per Week: 3 days    Minutes of Exercise per Session: 40 min  Stress: Stress Concern Present (09/06/2021)   [employer] of Occupational Health - Occupational Stress Questionnaire    Feeling of Stress : Very much  Social Connections: Socially Isolated (09/06/2021)   Social Connection and Isolation Panel [NHANES]    Frequency of Communication with Friends and Family: Three times a week    Frequency of Social Gatherings with Friends and Family: Twice a week    Attends Religious Services: Never    Database administrator or Organizations: No    Attends Engineer, structural: Never    Marital Status: Never married    Allergies: No Known Allergies  Current Medications: Current Outpatient Medications  Medication Sig Dispense Refill   hydrOXYzine (ATARAX) 25 MG tablet Take 1 tablet (25 mg total) by mouth 3 (three) times daily as needed for anxiety. 90 tablet 2   ibuprofen (ADVIL) 800 MG tablet Take 800 mg by mouth every 8 (eight) hours as needed.     ondansetron (ZOFRAN-ODT) 4 MG disintegrating tablet Take 1 tablet (4 mg total) by mouth every 8 (eight) hours as needed for nausea or vomiting. 30 tablet 1   Oxcarbazepine (TRILEPTAL) 300 MG tablet Take 1 tablet (300 mg total) by mouth 2 (two) times daily. 60 tablet 1   pantoprazole (PROTONIX) 40 MG tablet Take 1 tablet (40 mg total) by mouth daily. (Patient not taking: Reported on 10/23/2022) 30 tablet 3   traZODone (DESYREL) 100 MG tablet Take 1 tablet (100 mg total) by mouth at bedtime as needed for sleep (insomnia). 30 tablet 1   valACYclovir (VALTREX) 500 MG tablet Take 500 mg by mouth 2 (two) times daily.     vortioxetine HBr (TRINTELLIX) 10 MG TABS tablet Take 1 tablet (10 mg total) by mouth daily. 30 tablet 1   No current facility-administered medications for this visit.      Psychiatric Specialty Exam: Review of Systems  There were no vitals taken for this visit.There is no height or weight on file to calculate BMI.  General Appearance: Fairly Groomed  Eye  Contact:  Good  Speech:  Clear and Coherent and Normal Rate  Volume:  Normal  Mood:  Euthymic  Affect:  Appropriate and Congruent  Thought Process:  Coherent  Orientation:  Full (Time, Place, and Person)  Thought Content: Logical   Suicidal Thoughts:  No  Homicidal Thoughts:  No  Memory:  Immediate;   Fair  Judgement:  Good  Insight:  Good  Psychomotor Activity:  Normal  Concentration:  Concentration: Good  Recall:  Good  Fund of Knowledge: Good  Language: Good  Akathisia:  NA    AIMS (if indicated): not done  Assets:  Communication Skills Desire for Improvement Housing  ADL's:  Intact  Cognition: WNL  Sleep:  Good   Metabolic Disorder Labs: Lab Results  Component Value Date   HGBA1C 5.1 02/12/2022   MPG 97 07/21/2012   MPG 94 04/01/2011   No results found for: "PROLACTIN" Lab Results  Component Value Date   CHOL 164 07/09/2019   TRIG 199 (H) 07/09/2019   HDL 38 (L) 07/09/2019   CHOLHDL 4.3 07/09/2019   VLDL 12 07/21/2012   LDLCALC 92 07/09/2019   LDLCALC 68 12/16/2015   Lab Results  Component Value Date   TSH 1.700 02/12/2022   TSH 1.030 07/09/2019    Therapeutic Level Labs: No results found for: "LITHIUM" No results found for: "VALPROATE" No results found for: "CBMZ"   Screenings: AUDIT    Flowsheet Row ED to Hosp-Ad[facility] (Dis[facility]  Alcohol Use Disorder Identification Test Final Score (AUDIT) 0      GAD-7    Flowsheet Row Office Visit from 10/23/2022 in [facility] Office Visit from [facility]/2022 [facility] Office Visit from 02[facility]12/2022 in [facility] Office Visit from 01/03/2022  in [facility] Office Visit from 09/06/2021 in [facility]  Total GAD-7 Score 20 19 21 21 19       PHQ2-9    Flowsheet Row Office Visit from 10/23/2022 in [facility] Office Visit from 06/03/2022 in [facility] Office Visit from 02/12/2022 in [facility] Office Visit from 01/03/2022 in [facility] Office Visit from 09/06/2021 in [facility]  PHQ-2 Total Score 3 3 2 5 4   PHQ-9 Total Score 15 13 11 24 20       Flowsheet Row ED from 07/24/2022 in [facility] Visit from 06[facility]03/2022 [facility] from 01/17/2022 in [facility]  C-SSRS RISK CATEGORY No Risk No Risk No Risk       Collaboration of Care: Collaboration of Care: Medication Management AEB medication prescription and Referral or follow-up with counselor/therapist AEB chart review  Patient/Guardian was advised Release of Information must be obtained prior to any record release in order to collaborate their care with an outside provider. Patient/Guardian was advised if they have not already done so to contact the registration department to sign all necessary forms in order for [place] to release information regarding their care.   Consent: Patient/Guardian gives verbal consent for treatment and assignment of benefits for services provided during this visit. Patient/Guardian expressed understanding and agreed to proceed.    Stasia Cavalier, MD 05/05/2023, 8:55 AM  Virtual Visit via Video Note  I connected with [name] on 05/05/23 at 10:00 AM EST by a video enabled telemedicine application and verified that I am speaking with the correct person using two  identifiers.  Location: Patient: Home Provider: Home Office   I discussed the limitations of evaluation and management by telemedicine and the availability of in person appointments. The patient expressed understanding and agreed to proceed.   I discussed the assessment and treatment plan with the patient. The patient was provided an opportunity to ask questions and all were answered. The patient agreed with the plan and demonstrated an understanding of the instructions.   The patient was advised to call back or seek an in-person evaluation if the symptoms worsen or if the condition fails to improve as anticipated.  I provided *** minutes of non-face-to-face time during this encounter.   Stasia Cavalier, MD

## 2023-05-05 NOTE — Progress Notes (Deleted)
BH MD/PA/NP OP Progress Note  05/05/2023 8:54 AM Colleen [facility]  MRN:  [id number]  Visit Diagnosis:  No diagnosis found.   Assessment: Colleen [facility] is a 27 y.o. female with a history of oppositional defiant disorder, ADHD, anxiety, depression, borderline personality disorder, PTSD, and marijuana use disorder. who presented to [facility] at [facility] for initial evaluation on 06/03/2022.    During initial evaluation patient reported symptoms of mood lability including depressed mood, irritability, anxiety, and elevated mood.  She also endorsed symptoms of impulsive behaviors, chronic feelings of emptiness, emotional instability, real or perceived fear of abandonment, dissociative symptoms, and a past history of trauma.  Of note patient did endorse a history of nonsuicidal self-injury and poor sense of self that had improved after connecting with CBT/DBT groups.  Based on patient's symptoms and her past history of borderline personality disorder it does appear she is having reoccurrence of the symptoms and would benefit from restarting on medications and reconnecting with therapy/groups.  [facility] presents for follow-up evaluation. Today, 05/05/23, patient reports that    she has had significant improvement in both mood and anxiety symptoms in the interim.  These have occurred following her marijuana cessation almost 2 months ago.  Since then patient had improvement in concentration, mental cloudiness, motivation, and overall mood lability.  She is no longer struggling as much with irritability or anxiety she had in the past.  With this she has needed her as needed hydroxyzine less so than in the past.  She continues to take her current medication regimen and denies any adverse side effects.  We will continue her current regimen at this time though can consider tapering medications in the future if patient remains stable.  She will continue with therapy and we will follow up in 2  months.  Plan: - Continue Trileptal 300 mg BID - Continue Trintellix 10 mg QD - Continue hydroxyzine 25 mg TID prn for anxiety - Continue trazodone 100 QHS prn for insomnia - Continue therapy with [facility].  [facility] information provided - Neuropsych testing referral for ADHD - CMP, CBC, iron panel, Vit B12, TSH, A1c reviewed - EKG from 02/12/22 reviewed QTC 398 - Crisis resources reviewed - Follow up in 4-6 weeks  Chief Complaint:  No chief complaint on file.  HPI: On presentation today [facility] reports that     things are going really well for her recently.  Patient reports that she stopped smoking marijuana on 8/25 and has found things to be getting much better since then.  She reports being less impulsive, less irritable, less forgetful, more motivated, and not as mentally clouded as she had been in the past.  Anxiety is also improved and now she is only using the as needed hydroxyzine 1-2 times a week.  Otherwise while anxiety is still present it is well controlled by her medication and coping skills.  Patient reports that she decided to quit marijuana secondary to support from her therapist and as she is wanted to change her life after she turned 27.  She feels like without marijuana a lot more doors are open to her compared to the past.  Medication wise we discussed continuing on her current regimen at this time.  If she continues to do well at future visits we could consider tapering her current regimen.  Past Psychiatric History: She has a past psychiatric history of oppositional defiant disorder, ADHD, anxiety, depression, borderline personality disorder, PTSD, Schizoaffective disorder, and marijuana use disorder.  At 16 she was in psychiatric residential facility following a suicide attempt after her mom passed. Learned CBT and DBT there that she carried with her and had helped since.  Most notably the cognitive re framing.  She has seen a therapist intermittently in the  past  Medications- Pristiq, Zoloft, Paxil, Prozac, Lexapro, Wellbutrin, Cymbalta (palpitations), BuSpar, trazodone, lamictal, seroquel, hydroxyzine, ativan, and gabapentin.  She endorses daily marijuana want to use to help with anxiety symptoms.  She does note that it negatively impacts her concentration and increases her appetite.  Risk of marijuana use were discussed and adverse effects on anxiety were explained. Alcohol social use of one drink every couple weeks Cocaine use as a teenager   Past Medical History:  Past Medical History:  Diagnosis Date   Acute pyelonephritis 05/17/2018   Anemia    Anxiety    Complication of anesthesia    Depression    GERD (gastroesophageal reflux disease)    Herpes genitalia    Insomnia    Obesity    Polysubstance abuse (HCC) 04/01/2011   PONV (postoperative nausea and vomiting)    Seizures (HCC)    once in 2016 due to alcohol poisioning   Suicide [facility])     Past Surgical History:  Procedure Laterality Date   CESAREAN SECTION N/A 08/30/2020   Procedure: CESAREAN SECTION;  Surgeon: Kathrynn Running, MD;  Location: [facility]S;  Service: Obstetrics;  Laterality: N/A;   CHOLECYSTECTOMY  2015   COSMETIC SURGERY  Age 46   dog bite to face   IR FLUORO GUIDED NEEDLE PLC ASPIRATION/INJECTION LOC  08/22/2020   IR US GUIDE BX ASP/DRAIN  08/22/2020   LAMINECTOMY N/A 08/22/2020   Procedure: THORACIC TWELVE TO LUMBAR ONELAMINECTOMY FOR DRAINAGE OF EPIDURAL ABSCESS;  Surgeon: Barnett Abu, MD;  Location: [facility] OR;  Service: Neurosurgery;  Laterality: N/A;   Family History:  Family History  Problem Relation Age of Onset   Alcohol abuse Mother    Stroke Mother    Bipolar disorder Mother    Drug abuse Mother    Alcohol abuse Father    Bipolar disorder Father    Drug abuse Father    Diabetes Father    Heart failure Father    Cancer Maternal Aunt        "stomach cancer"   Diabetes Maternal Aunt    Crohn's disease Paternal Aunt    Crohn's disease Paternal  Aunt    Crohn's disease Paternal Uncle    Colon cancer Paternal Uncle    Breast cancer Maternal Grandmother    Colon cancer Maternal Grandfather    Diabetes type II Paternal Grandfather    Heart failure Paternal Grandfather    Celiac disease Neg Hx    Cervical cancer Neg Hx    Lung cancer Neg Hx     Social History:  Social History   Socioeconomic History   Marital status: Single    Spouse name: Not on file   Number of children: 2   Years of education: Not on file   Highest education level: GED or equivalent  Occupational History   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    Comment:    Tobacco Use   Smoking status: Former    Current packs/day: 0.00    Average packs/day: 0.5 packs/day for 5.0 years (2.5 ttl pk-yrs)    Types: Cigarettes    Start date: 11/24/2014    Quit date: 11/24/2019    Years since quitting: 3.4   Smokeless tobacco:  Never   Tobacco comments:    one pack cigarettes daily  Vaping Use   Vaping status: Every Day   Start date: 08/26/2019  Substance and Sexual Activity   Alcohol use: Yes    Comment: social   Drug use: Yes    Types: Marijuana    Comment: every other day   Sexual activity: Yes    Partners: Male    Birth control/protection: Other-see comments    Comment: female partner  Other Topics Concern   Not on file  Social History Narrative   Lives with her dad and her two children, will be going to [employer] in the spring. Works ar [employer].    Social Determinants of [facility]   Financial Resource Strain: Low Risk  (09/06/2021)   Overall Financial Resource Strain (CARDIA)    Difficulty of Paying Living Expenses: Not hard at all  Food Insecurity: No Food Insecurity (09/06/2021)   Hunger Vital Sign    Worried About Running Out of Food in the Last Year: Never true    Ran Out of Food in the Last Year: Never true  Transportation Needs: No Transportation Needs (09/06/2021)   PRAPARE - Administrator, Civil Service  (Medical): No    Lack of Transportation (Non-Medical): No  Physical Activity: Insufficiently Active (09/06/2021)   Exercise Vital Sign    Days of Exercise per Week: 3 days    Minutes of Exercise per Session: 40 min  Stress: Stress Concern Present (09/06/2021)   [facility] of Occupational [facility] - Occupational Stress Questionnaire    Feeling of Stress : Very much  Social Connections: Socially Isolated (09/06/2021)   Social Connection and Isolation Panel [NHANES]    Frequency of Communication with Friends and Family: Three times a week    Frequency of Social Gatherings with Friends and Family: Twice a week    Attends Religious Services: Never    Database administrator or Organizations: No    Attends Engineer, structural: Never    Marital Status: Never married    Allergies: No Known Allergies  Current Medications: Current Outpatient Medications  Medication Sig Dispense Refill   hydrOXYzine (ATARAX) 25 MG tablet Take 1 tablet (25 mg total) by mouth 3 (three) times daily as needed for anxiety. 90 tablet 2   ibuprofen (ADVIL) 800 MG tablet Take 800 mg by mouth every 8 (eight) hours as needed.     ondansetron (ZOFRAN-ODT) 4 MG disintegrating tablet Take 1 tablet (4 mg total) by mouth every 8 (eight) hours as needed for nausea or vomiting. 30 tablet 1   Oxcarbazepine (TRILEPTAL) 300 MG tablet Take 1 tablet (300 mg total) by mouth 2 (two) times daily. 60 tablet 1   pantoprazole (PROTONIX) 40 MG tablet Take 1 tablet (40 mg total) by mouth daily. (Patient not taking: Reported on 10/23/2022) 30 tablet 3   traZODone (DESYREL) 100 MG tablet Take 1 tablet (100 mg total) by mouth at bedtime as needed for sleep (insomnia). 30 tablet 1   valACYclovir (VALTREX) 500 MG tablet Take 500 mg by mouth 2 (two) times daily.     vortioxetine HBr (TRINTELLIX) 10 MG TABS tablet Take 1 tablet (10 mg total) by mouth daily. 30 tablet 1   No current facility-administered medications for this visit.      Musculoskeletal: Strength & Muscle Tone: within normal limits Gait & Station: normal Patient leans: N/A  Psychiatric Specialty Exam: Review of Systems  There were no vitals taken  for this visit.There is no height or weight on file to calculate BMI.  General Appearance: Fairly Groomed  Eye Contact:  Good  Speech:  Clear and Coherent and Normal Rate  Volume:  Normal  Mood:  Euthymic  Affect:  Congruent  Thought Process:  Coherent, Goal Directed, and Linear  Orientation:  Full (Time, Place, and Person)  Thought Content: Logical   Suicidal Thoughts:  No  Homicidal Thoughts:  No  Memory:  NA  Judgement:  Good  Insight:  Fair  Psychomotor Activity:  Normal  Concentration:  Concentration: Fair  Recall:  Good  Fund of Knowledge: Good  Language: Good  Akathisia:  NA    AIMS (if indicated): not done  Assets:  Communication Skills Desire for Improvement Housing Talents/Skills Transportation Vocational/Educational  ADL's:  Intact  Cognition: WNL  Sleep:  Good   Metabolic Disorder Labs: Lab Results  Component Value Date   HGBA1C 5.1 02/12/2022   MPG 97 07/21/2012   MPG 94 04/01/2011   No results found for: "PROLACTIN" Lab Results  Component Value Date   CHOL 164 07/09/2019   TRIG 199 (H) 07/09/2019   HDL 38 (L) 07/09/2019   CHOLHDL 4.3 07/09/2019   VLDL 12 07/21/2012   LDLCALC 92 07/09/2019   LDLCALC 68 12/16/2015   Lab Results  Component Value Date   TSH 1.700 02/12/2022   TSH 1.030 07/09/2019    Therapeutic Level Labs: No results found for: "LITHIUM" No results found for: "VALPROATE" No results found for: "CBMZ"   Screenings: AUDIT    Flowsheet Row ED to Hosp-Admission (Discharged) [facility]  Alcohol Use Disorder Identification Test Final Score (AUDIT) 0      GAD-7    Flowsheet Row Office Visit from 10/23/2022 in [facility] Office Visit from [facility]/03/2022  [facility] Office Visit from 02/12/2022 in [facility] Visit from 01/03/2022 in [facility] Visit from 09[facility]06/2021 [facility]  Total GAD-7 Score 20 19 21 21 19       PHQ2-9    Flowsheet Row Office Visit from 10/23/2022 in [place] [facility] Office Visit from 06/03/2022 in [facility] Office Visit from 02/12/2022 in [facility] [facility] Office Visit from 01/03/2022 in [facility] Office Visit from 09/06/2021 in [facility] [place] [facility]  PHQ-2 Total Score 3 3 2 5 4   PHQ-9 Total Score 15 13 11 24 20       Flowsheet Row ED from 07/24/2022 in [facility] Visit from 06/03/2022 [facility] from 01[facility]17/2022 in [facility]  C-SSRS RISK CATEGORY No Risk No Risk No Risk       Collaboration of Care: Collaboration of Care: Medication Management AEB medication prescription and Referral or follow-up with counselor/therapist AEB chart review  Patient/Guardian was advised Release of Information must be obtained prior to any record release in order to collaborate their care with an outside provider. Patient/Guardian was advised if they have not already done so to contact the registration department to sign all necessary forms in order for [place] to release information regarding their care.   Consent: Patient/Guardian gives verbal consent for treatment and assignment of benefits for  services provided during this visit. Patient/Guardian expressed understanding and agreed to proceed.    Virtual Visit via Video Note  I connected with Colleen [facility] on 11/29/22 at 10:30 AM EDT by a  video enabled telemedicine application and verified that I am speaking with the correct person using two identifiers.  Location: Patient: Home Provider: Home Office   I discussed the limitations of evaluation and management by telemedicine and the availability of in person appointments. The patient expressed understanding and agreed to proceed.   I discussed the assessment and treatment plan with the patient. The patient was provided an opportunity to ask questions and all were answered. The patient agreed with the plan and demonstrated an understanding of the instructions.   The patient was advised to call back or seek an in-person evaluation if the symptoms worsen or if the condition fails to improve as anticipated.  I provided 10 minutes of non-face-to-face time during this encounter  Stasia Cavalier, MD 05/05/2023, 8:54 AM

## 2023-05-06 ENCOUNTER — Encounter (HOSPITAL_COMMUNITY): Payer: Self-pay | Admitting: Psychiatry

## 2023-05-06 ENCOUNTER — Encounter (HOSPITAL_COMMUNITY): Payer: Self-pay

## 2023-05-06 ENCOUNTER — Encounter (HOSPITAL_COMMUNITY): Payer: No Typology Code available for payment source | Admitting: Psychiatry

## 2023-05-06 NOTE — Progress Notes (Signed)
This encounter was created in error - please disregard.  The patient was contacted when she did not show up for her appointment via telephone.  She did not answer her phone and her voicemail was full.  Did contact her own phone number and left a message with patient's father to remind her of the appointment and informed her that she can reschedule if she is unable to make it.

## 2023-05-08 ENCOUNTER — Other Ambulatory Visit: Payer: Self-pay | Admitting: Medical Genetics

## 2023-05-09 ENCOUNTER — Other Ambulatory Visit (HOSPITAL_COMMUNITY): Payer: Self-pay | Attending: Medical Genetics

## 2023-05-26 ENCOUNTER — Telehealth (HOSPITAL_COMMUNITY): Payer: Self-pay | Admitting: Clinical

## 2023-05-26 NOTE — Telephone Encounter (Signed)
Patient stated " I do not due groups" patient declined scheduling a group meeting appointment.

## 2023-06-09 NOTE — Progress Notes (Signed)
BH MD/PA/NP OP Progress Note  06/12/2023 11:37 AM Colleen Valentine  MRN:  [id number]  Visit Diagnosis:    ICD-10-CM   1. Borderline personality disorder (HCC)  F60.3 vortioxetine HBr (TRINTELLIX) 10 MG TABS tablet    traZODone (DESYREL) 100 MG tablet    Oxcarbazepine (TRILEPTAL) 300 MG tablet    2. Mild episode of recurrent major depressive disorder (HCC)  F33.0 vortioxetine HBr (TRINTELLIX) 10 MG TABS tablet    traZODone (DESYREL) 100 MG tablet       Assessment: Colleen Valentine is a 28 y.o. female with a history of oppositional defiant disorder, ADHD, anxiety, depression, borderline personality disorder, PTSD, and marijuana use disorder. who presented to [facility] for initial evaluation on 06/03/2022.    During initial evaluation patient reported symptoms of mood lability including depressed mood, irritability, anxiety, and elevated mood.  She also endorsed symptoms of impulsive behaviors, chronic feelings of emptiness, emotional instability, real or perceived fear of abandonment, dissociative symptoms, and a past history of trauma.  Of note patient did endorse a history of nonsuicidal self-injury and poor sense of self that had improved after connecting with CBT/DBT groups.  Based on patient's symptoms and her past history of borderline personality disorder it does appear she is having reoccurrence of the symptoms and would benefit from restarting on medications and reconnecting with therapy/groups.  Colleen Valentine presents for follow-up evaluation. Today, 06/12/23, patient reports an increase in depression in the interim following the loss of her grandfather.  While some of this can be consistent with grief response patient has had some more significant symptoms including amotivation, anhedonia, low mood, fatigue, increased tearfulness, and guilt.  Patient denies any SI or thoughts of self-harm.  She did relapse on marijuana shortly after his passing that has been  sober since mid November.  Of note patient did self taper off of Trintellix in the interim.  We recommended coordinating with provider whenever making medication changes.  As she is currently struggling with depressive symptoms we did suggest restarting Trintellix today and we will taper back up to 10 mg.  Patient reports that anxiety symptoms have been stable.  She is not connected with her therapist recently though has an upcoming appointment in March.  Did offer to look into moving forward however patient did not feel it was necessary.  We will continue on the remainder of her current regimen and follow-up in a month.  Psychotherapeutic interventions were used during today's session. From 10:05 AM to 10:21 AM we worked on processing grief following the loss of her grandfather.  Supportive interviewing techniques were used and discussed ways to honor his memory.  Also worked on reframing thoughts on the positive of aspects of their times together.  Patient has been noticing some gradual improvement in grief.   Plan: - Continue Trileptal 300 mg BID - Restart Trintellix at 5 mg QD for 7 days before increasing to 10 mg QD - Continue hydroxyzine 25 mg TID prn for anxiety - Continue trazodone 100 QHS prn for insomnia - Continue therapy with [name].  [facility] information provided - Neuropsych testing referral for ADHD - CMP, CBC, iron panel, Vit B12, TSH, A1c reviewed - EKG from 02/12/22 reviewed QTC 398 - Crisis resources reviewed - Follow up in a month  Chief Complaint:  Chief Complaint  Patient presents with   Follow-up   HPI: On presentation today Colleen Valentine reports that she has been getting by but things have been difficult since  the passing of her grandfather on November 6.  She has been struggling with increasing grief/depression since then and is not moving through the stages as quickly as she would like.  Even though he was older and had lived a good life she has found his passing  to be particularly difficult since he was so important to her.  Patient has found that her motivation, energy, interest level, and sleep have all been worse following his passing.  She does deny any SI or thoughts of self-harm.  Of note patient did relapse on marijuana for 2 weeks after his passing though has been sober from it and all other substances since.  While she regrets her actions she is not being overly negative to herself for that relapse.  Patient reports that she has good support from her fianc.  She can talk about her stressors and process the loss of her grandfather through that.  She does still intend to continue with therapy though did not feel like an appointment before March was necessary.  We reviewed how having someone she can talk with regularly using outside party can be beneficial to help with processing grief.  Patient will reschedule a sooner appointment in the future if she feels that is needed.    Outside of this patient reports that things have been going fairly well for her.  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  She does have to retake her skills exam but is confident that she will pass.  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  She also reports that she has a very supportive fianc who she can talk with about the loss of her grandfather.  Regarding medications patient reports that she discontinued the hydroxyzine as she has not been anxious recently.  She also had self tapered off of Trintellix in the interim due to reports of feeling that it was no longer necessary.  While we had discussed the possibility of trying to taper off medications in the future we had not plan for this and encouraged patient to reach out if she were to want to make medication adjustments.  That said given patient's  current significant grief which is bordering on active depression she would benefit from restarting Trintellix.  Risk and benefits of this were reviewed.  As more time passes from her grandfather's passing we can consider tapering off the Trintellix again if mood has improved.   Past Psychiatric History: She has a past psychiatric history of oppositional defiant disorder, ADHD, anxiety, depression, borderline personality disorder, PTSD, Schizoaffective disorder, and marijuana use disorder.  At 16 she was in psychiatric residential facility following a suicide attempt after her mom passed. Learned CBT and DBT there that she carried with her and had helped since.  Most notably the cognitive re framing.  She has seen a therapist intermittently in the past  Medications- Pristiq, Zoloft, Paxil, Prozac, Lexapro, Wellbutrin, Cymbalta (palpitations), BuSpar, trazodone, lamictal, seroquel, hydroxyzine, ativan, and gabapentin.  She endorsed daily marijuana at initial evaluation.  She reported it helped with anxiety symptoms.  She does note that it negatively impacts her concentration and increases her appetite.  Later on patient began to notice the adverse side effects of the medication and became sober.  She had a relapse in November 2024 for  2 weeks following the passing of her grandfather.  Since then she has been sober from all substances.  Had used alcohol in the past averaging a couple drinks a week. Cocaine use as a teenager   Past Medical History:  Past Medical History:  Diagnosis Date   Acute pyelonephritis 05/17/2018   Anemia    Anxiety    Complication of anesthesia    Depression    GERD (gastroesophageal reflux disease)    Herpes genitalia    Insomnia    Obesity    Polysubstance abuse (HCC) 04/01/2011   PONV (postoperative nausea and vomiting)    Seizures (HCC)    once in 2016 due to alcohol poisioning   Suicide [facility])     Past Surgical History:  Procedure Laterality Date   CESAREAN  SECTION N/A 08/30/2020   Procedure: CESAREAN SECTION;  Surgeon: Kathrynn Running, MD;  Location: [facility] LD ORS;  Service: Obstetrics;  Laterality: N/A;   CHOLECYSTECTOMY  2015   COSMETIC SURGERY  Age 58   dog bite to face   IR FLUORO GUIDED NEEDLE PLC ASPIRATION/INJECTION LOC  08/22/2020   IR US GUIDE BX ASP/DRAIN  08/22/2020   LAMINECTOMY N/A 08/22/2020   Procedure: THORACIC TWELVE TO LUMBAR ONELAMINECTOMY FOR DRAINAGE OF EPIDURAL ABSCESS;  Surgeon: Barnett Abu, MD;  Location: [facility] OR;  Service: Neurosurgery;  Laterality: N/A;   Family History:  Family History  Problem Relation Age of Onset   Alcohol abuse Mother    Stroke Mother    Bipolar disorder Mother    Drug abuse Mother    Alcohol abuse Father    Bipolar disorder Father    Drug abuse Father    Diabetes Father    Heart failure Father    Cancer Maternal Aunt        "stomach cancer"   Diabetes Maternal Aunt    Crohn's disease Paternal Aunt    Crohn's disease Paternal Aunt    Crohn's disease Paternal Uncle    Colon cancer Paternal Uncle    Breast cancer Maternal Grandmother    Colon cancer Maternal Grandfather    Diabetes type II Paternal Grandfather    Heart failure Paternal Grandfather    Celiac disease Neg Hx    Cervical cancer Neg Hx    Lung cancer Neg Hx     Social History:  Social History   Socioeconomic History   Marital status: Single    Spouse name: Not on file   Number of children: 2   Years of education: Not on file   Highest education level: GED or equivalent  Occupational History   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    Comment:    Tobacco Use   Smoking status: Former    Current packs/day: 0.00    Average packs/day: 0.5 packs/day for 5.0 years (2.5 ttl pk-yrs)    Types: Cigarettes    Start date: 11/24/2014    Quit date: 11/24/2019    Years since quitting: 3.5   Smokeless tobacco: Never   Tobacco comments:    one pack cigarettes daily  Vaping Use   Vaping status: Every Day   Start  date: 08/26/2019  Substance and Sexual Activity   Alcohol use: Yes    Comment: social   Drug use: Yes    Types: Marijuana    Comment: every other day   Sexual activity: Yes    Partners: Male    Birth control/protection: Other-see comments    Comment: female partner  Other Topics  Concern   Not on file  Social History Narrative   Lives with her dad and her two children, will be going to [employer] university in the spring. Works ar [employer].    Social Drivers of Corporate investment banker Strain: Low Risk  (06/11/2023)   Received from [facility]   Overall Financial Resource Strain (CARDIA)    Difficulty of Paying Living Expenses: Not hard at all  Food Insecurity: No Food Insecurity (06/11/2023)   [facility] from [facility]   Hunger Vital Sign    Worried About Running Out of Food in the Last Year: Never true    Ran Out of Food in the Last Year: Never true  Transportation Needs: No Transportation Needs (06/11/2023)   Received from [facility] - Transportation    Lack of Transportation (Medical): No    Lack of Transportation (Non-Medical): No  Physical Activity: Unknown (06/11/2023)   Received from [facility]   Exercise Vital Sign    Days of Exercise per Week: 0 days    Minutes of Exercise per Session: Not on file  Stress: Stress Concern Present (06/11/2023)   Received from [facility] - Occupational Stress Questionnaire    Feeling of Stress : Very much  Social Connections: Somewhat Isolated (06/11/2023)   Received from [facility]   Social Network    How would you rate your social network (family, work, friends)?: Restricted participation with some degree of social isolation    Allergies: No Known Allergies  Current Medications: Current Outpatient Medications  Medication Sig Dispense Refill   hydrOXYzine (ATARAX) 25 MG tablet Take 1 tablet (25 mg total) by mouth 3 (three) times daily as needed for anxiety.  90 tablet 2   ibuprofen (ADVIL) 800 MG tablet Take 800 mg by mouth every 8 (eight) hours as needed.     ondansetron (ZOFRAN-ODT) 4 MG disintegrating tablet Take 1 tablet (4 mg total) by mouth every 8 (eight) hours as needed for nausea or vomiting. 30 tablet 1   Oxcarbazepine (TRILEPTAL) 300 MG tablet Take 1 tablet (300 mg total) by mouth 2 (two) times daily. 60 tablet 1   pantoprazole (PROTONIX) 40 MG tablet Take 1 tablet (40 mg total) by mouth daily. (Patient not taking: Reported on 10/23/2022) 30 tablet 3   traZODone (DESYREL) 100 MG tablet Take 1 tablet (100 mg total) by mouth at bedtime as needed for sleep (insomnia). 30 tablet 1   valACYclovir (VALTREX) 500 MG tablet Take 500 mg by mouth 2 (two) times daily.     vortioxetine HBr (TRINTELLIX) 10 MG TABS tablet Take 0.5 tablets (5 mg total) by mouth daily for 7 days, THEN 1 tablet (10 mg total) daily for 23 days. 30 tablet 1   No current facility-administered medications for this visit.     Psychiatric Specialty Exam: Review of Systems  There were no vitals taken for this visit.There is no height or weight on file to calculate BMI.  General Appearance: Fairly Groomed  Eye Contact:  Good  Speech:  Clear and Coherent and Normal Rate  Volume:  Normal  Mood:  Depressed  Affect:  Congruent  Thought Process:  Coherent, Goal Directed, and Linear  Orientation:  Full (Time, Place, and Person)  Thought Content: Logical   Suicidal Thoughts:  No  Homicidal Thoughts:  No  Memory:  NA  Judgement:  Good  Insight:  Fair  Psychomotor Activity:  Normal  Concentration:  Concentration: Fair  Recall:  Good  Fund of Knowledge: Good  Language: Good  Akathisia:  NA    AIMS (if indicated): not done  Assets:  Communication Skills Desire for Improvement Housing Talents/Skills Transportation Vocational/Educational  ADL's:  Intact  Cognition: WNL  Sleep:  Good   Metabolic Disorder Labs: Lab Results  Component Value Date   HGBA1C 5.1  02/12/2022   MPG 97 07/21/2012   MPG 94 04/01/2011   No results found for: "PROLACTIN" Lab Results  Component Value Date   CHOL 164 07/09/2019   TRIG 199 (H) 07/09/2019   HDL 38 (L) 07/09/2019   CHOLHDL 4.3 07/09/2019   VLDL 12 07/21/2012   LDLCALC 92 07/09/2019   LDLCALC 68 12/16/2015   Lab Results  Component Value Date   TSH 1.700 02/12/2022   TSH 1.030 07/09/2019    Therapeutic Level Labs: No results found for: "LITHIUM" No results found for: "VALPROATE" No results found for: "CBMZ"   Screenings: AUDIT    Flowsheet Row ED to Hosp-Ad[facility] (Dis[facility]  Alcohol Use Disorder Identification Test Final Score (AUDIT) 0      GAD-7    Flowsheet Row Office Visit from 10/23/2022 in [facility] Office Visit from [facility]/03/2022 [facility] Office Visit from 02/12/2022 in [facility] Visit from 01/03/2022 in [facility] Office Visit from 09/06/2021 in [facility]  Total GAD-7 Score 20 19 21 21 19       PHQ2-9    Flowsheet Row Office Visit from 10/23/2022 in [facility] Office Visit from 06/03/2022 in [facility] Office Visit from 02/12/2022 in [facility] Office Visit from 01/03/2022 in [facility] Office Visit from 09/06/2021 in [facility] at [facility]  PHQ-2 Total Score 3 3 2 5 4   PHQ-9 Total Score 15 13 11 24 20       Flowsheet Row ED from 07/24/2022 in [facility] Visit from 06/03/2022 [facility] ED from 01/17/2022 in [facility] Emergency Department at [facility]  C-SSRS  RISK CATEGORY No Risk No Risk No Risk       Collaboration of Care: Collaboration of Care: Medication Management AEB medication prescription and Referral or follow-up with counselor/therapist AEB chart review  Patient/Guardian was advised Release of Information must be obtained prior to any record release in order to collaborate their care with an outside provider. Patient/Guardian was advised if they have not already done so to contact the registration department to sign all necessary forms in order for [place] to release information regarding their care.   Consent: Patient/Guardian gives verbal consent for treatment and assignment of benefits for services provided during this visit. Patient/Guardian expressed understanding and agreed to proceed.    Virtual Visit via Video Note  I connected with Colleen Valentine on 06/12/23 at 10:00 AM EDT by a video enabled telemedicine application and verified that I am speaking with the correct person using two identifiers.  Location: Patient: Home Provider: Home Office   I discussed the limitations of evaluation and management by telemedicine and the availability of in person appointments. The patient expressed understanding and agreed to proceed.   I discussed the assessment and treatment plan with  the patient. The patient was provided an opportunity to ask questions and all were answered. The patient agreed with the plan and demonstrated an understanding of the instructions.   The patient was advised to call back or seek an in-person evaluation if the symptoms worsen or if the condition fails to improve as anticipated.  I provided 10 minutes of non-face-to-face time during this encounter  Stasia Cavalier, MD 06/12/2023, 11:37 AM

## 2023-06-12 ENCOUNTER — Telehealth (HOSPITAL_COMMUNITY): Payer: MEDICAID | Admitting: Psychiatry

## 2023-06-12 ENCOUNTER — Encounter (HOSPITAL_COMMUNITY): Payer: Self-pay | Admitting: Psychiatry

## 2023-06-12 DIAGNOSIS — F330 Major depressive disorder, recurrent, mild: Secondary | ICD-10-CM

## 2023-06-12 DIAGNOSIS — F603 Borderline personality disorder: Secondary | ICD-10-CM | POA: Diagnosis not present

## 2023-06-12 MED ORDER — VORTIOXETINE HBR 10 MG PO TABS: ORAL_TABLET | ORAL | 1 refills | Status: AC

## 2023-06-12 MED ORDER — TRAZODONE HCL 100 MG PO TABS: 100.0000 mg | ORAL_TABLET | Freq: Every evening | ORAL | 1 refills | Status: AC | PRN

## 2023-06-12 MED ORDER — OXCARBAZEPINE 300 MG PO TABS: 300.0000 mg | ORAL_TABLET | Freq: Two times a day (BID) | ORAL | 1 refills | Status: DC

## 2023-07-21 NOTE — Progress Notes (Deleted)
 [facility] MD/PA/NP OP Progress Note  07/21/2023 2:56 PM Colleen Valentine  MRN:  [id number]  Visit Diagnosis:  No diagnosis found.  Assessment: Colleen Valentine is a 28 y.o. female with a history of oppositional defiant disorder, ADHD, anxiety, depression, borderline personality disorder, PTSD, and marijuana use disorder. who presented to [facility] at [facility] for initial evaluation on 06/03/2022.    During initial evaluation patient reported symptoms of mood lability including depressed mood, irritability, anxiety, and elevated mood.  She also endorsed symptoms of impulsive behaviors, chronic feelings of emptiness, emotional instability, real or perceived fear of abandonment, dissociative symptoms, and a past history of trauma.  Of note patient did endorse a history of nonsuicidal self-injury and poor sense of self that had improved after connecting with CBT/DBT groups.  Based on patient's symptoms and her past history of borderline personality disorder it does appear she is having reoccurrence of the symptoms and would benefit from restarting on medications and reconnecting with therapy/groups.  Colleen Valentine presents for follow-up evaluation. Today, 07/21/23, patient reports    an increase in depression in the interim following the loss of her grandfather.  While some of this can be consistent with grief response patient has had some more significant symptoms including amotivation, anhedonia, low mood, fatigue, increased tearfulness, and guilt.  Patient denies any SI or thoughts of self-harm.  She did relapse on marijuana shortly after his passing that has been sober since mid November.  Of note patient did self taper off of Trintellix in the interim.  We recommended coordinating with provider whenever making medication changes.  As she is currently struggling with depressive symptoms we did suggest restarting Trintellix today and we will taper back up to 10 mg.  Patient reports that anxiety  symptoms have been stable.  She is not connected with her therapist recently though has an upcoming appointment in March.  Did offer to look into moving forward however patient did not feel it was necessary.  We will continue on the remainder of her current regimen and follow-up in a month.  Psychotherapeutic interventions were used during today's session. From 10:05 AM to 10:21 AM we worked on processing grief following the loss of her grandfather.  Supportive interviewing techniques were used and discussed ways to honor his memory.  Also worked on reframing thoughts on the positive of aspects of their times together.  Patient has been noticing some gradual improvement in grief.   Plan: - Continue Trileptal 300 mg BID - Restart Trintellix at 5 mg QD for 7 days before increasing to 10 mg QD - Continue hydroxyzine 25 mg TID prn for anxiety - Continue trazodone 100 QHS prn for insomnia - Continue therapy with [name].  [facility] information provided - Neuropsych testing referral for ADHD - CMP, CBC, iron panel, Vit B12, TSH, A1c reviewed - EKG from 02/12/22 reviewed QTC 398 - Crisis resources reviewed - Follow up in a month  Chief Complaint:  No chief complaint on file.  HPI: On presentation today Colleen Valentine reports that    she has been getting by but things have been difficult since the passing of her grandfather on November 6.  She has been struggling with increasing grief/depression since then and is not moving through the stages as quickly as she would like.  Even though he was older and had lived a good life she has found his passing to be particularly difficult since he was so important to her.  Patient has found that her  motivation, energy, interest level, and sleep have all been worse following his passing.  She does deny any SI or thoughts of self-harm.  Of note patient did relapse on marijuana for 2 weeks after his passing though has been sober from it and all other substances  since.  While she regrets her actions she is not being overly negative to herself for that relapse.  Patient reports that she has good support from her fianc.  She can talk about her stressors and process the loss of her grandfather through that.  She does still intend to continue with therapy though did not feel like an appointment before March was necessary.  We reviewed how having someone she can talk with regularly using outside party can be beneficial to help with processing grief.  Patient will reschedule a sooner appointment in the future if she feels that is needed.    Outside of this patient reports that things have been going fairly well for her.  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  She does have to retake her skills exam but is confident that she will pass.  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  She also reports that she has a very supportive fianc who she can talk with about the loss of her grandfather.  Regarding medications patient reports that she discontinued the hydroxyzine as she has not been anxious recently.  She also had self tapered off of Trintellix in the interim due to reports of feeling that it was no longer necessary.  While we had discussed the possibility of trying to taper off medications in the future we had not plan for this and encouraged patient to reach out if she were to want to make medication adjustments.  That said given patient's current significant grief which is bordering on active depression she would benefit from restarting Trintellix.  Risk and benefits of this were reviewed.  As more time passes from her grandfather's passing we can consider tapering off the Trintellix again if mood has improved.   Past Psychiatric History: She has a past psychiatric history of  oppositional defiant disorder, ADHD, anxiety, depression, borderline personality disorder, PTSD, Schizoaffective disorder, and marijuana use disorder.  At 16 she was in psychiatric residential facility following a suicide attempt after her mom passed. Learned CBT and DBT there that she carried with her and had helped since.  Most notably the cognitive re framing.  She has seen a therapist intermittently in the past  Medications- Pristiq, Zoloft, Paxil, Prozac, Lexapro, Wellbutrin, Cymbalta (palpitations), BuSpar, trazodone, lamictal, seroquel, hydroxyzine, ativan, and gabapentin.  She endorsed daily marijuana at initial evaluation.  She reported it helped with anxiety symptoms.  She does note that it negatively impacts her concentration and increases her appetite.  Later on patient began to notice the adverse side effects of the medication and became sober.  She had a relapse in November 2024 for 2 weeks following the passing of her grandfather.  Since then she has been sober from all substances.  Had used alcohol in the past averaging a couple drinks a week. Cocaine use as a teenager   Past Medical History:  Past Medical History:  Diagnosis Date   Acute pyelonephritis 05/17/2018   Anemia    Anxiety    Complication of  anesthesia    Depression    GERD (gastroesophageal reflux disease)    Herpes genitalia    Insomnia    Obesity    Polysubstance abuse (HCC) 04/01/2011   PONV (postoperative nausea and vomiting)    Seizures (HCC)    once in 2016 due to alcohol poisioning   Suicide [facility])     Past Surgical History:  Procedure Laterality Date   CESAREAN SECTION N/A 08/30/2020   Procedure: CESAREAN SECTION;  Surgeon: Kathrynn Running, MD;  Location: [facility] LD ORS;  Service: Obstetrics;  Laterality: N/A;   CHOLECYSTECTOMY  2015   COSMETIC SURGERY  Age 55   dog bite to face   IR FLUORO GUIDED NEEDLE PLC ASPIRATION/INJECTION LOC  08/22/2020   IR US GUIDE BX ASP/DRAIN  08/22/2020   LAMINECTOMY N/A  08/22/2020   Procedure: THORACIC TWELVE TO LUMBAR ONELAMINECTOMY FOR DRAINAGE OF EPIDURAL ABSCESS;  Surgeon: Barnett Abu, MD;  Location: [facility] OR;  Service: Neurosurgery;  Laterality: N/A;   Family History:  Family History  Problem Relation Age of Onset   Alcohol abuse Mother    Stroke Mother    Bipolar disorder Mother    Drug abuse Mother    Alcohol abuse Father    Bipolar disorder Father    Drug abuse Father    Diabetes Father    Heart failure Father    Cancer Maternal Aunt        "stomach cancer"   Diabetes Maternal Aunt    Crohn's disease Paternal Aunt    Crohn's disease Paternal Aunt    Crohn's disease Paternal Uncle    Colon cancer Paternal Uncle    Breast cancer Maternal Grandmother    Colon cancer Maternal Grandfather    Diabetes type II Paternal Grandfather    Heart failure Paternal Grandfather    Celiac disease Neg Hx    Cervical cancer Neg Hx    Lung cancer Neg Hx     Social History:  Social History   Socioeconomic History   Marital status: Single    Spouse name: Not on file   Number of children: 2   Years of education: Not on file   Highest education level: GED or equivalent  Occupational History   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    Comment:    Tobacco Use   Smoking status: Former    Current packs/day: 0.00    Average packs/day: 0.5 packs/day for 5.0 years (2.5 ttl pk-yrs)    Types: Cigarettes    Start date: 11/24/2014    Quit date: 11/24/2019    Years since quitting: 3.6   Smokeless tobacco: Never   Tobacco comments:    one pack cigarettes daily  Vaping Use   Vaping status: Every Day   Start date: 08/26/2019  Substance and Sexual Activity   Alcohol use: Yes    Comment: social   Drug use: Yes    Types: Marijuana    Comment: every other day   Sexual activity: Yes    Partners: Male    Birth control/protection: Other-see comments    Comment: female partner  Other Topics Concern   Not on file  Social History Narrative   Lives  with her dad and her two children, will be going to [employer] in the spring. Works ar [employer].    Social Drivers of Corporate investment banker Strain: Low Risk  (06/11/2023)   Received from [facility]   Overall Financial Resource Strain (CARDIA)  Difficulty of Paying Living Expenses: Not hard at all  Food Insecurity: No Food Insecurity (06/11/2023)   Received from [facility], [facility]   Hunger Vital Sign    Worried About Running Out of Food in the Last Year: Never true    Ran Out of Food in the Last Year: Never true  Transportation Needs: No Transportation Needs (06/11/2023)   Received from [facility]   [facility] - Transportation    Lack of Transportation (Medical): No    Lack of Transportation (Non-Medical): No  Physical Activity: Unknown (06/11/2023)   Received from [facility], [facility]   Exercise Vital Sign    Days of Exercise per Week: 0 days    Minutes of Exercise per Session: Not on file  Stress: Stress Concern Present (06/11/2023)   Received from [facility], [facility] - Occupational Stress Questionnaire    Feeling of Stress : Very much  Social Connections: Somewhat Isolated (06/11/2023)   Received from [facility], [facility]   Social Network    How would you rate your social network (family, work, friends)?: Restricted participation with some degree of social isolation    Allergies: No Known Allergies  Current Medications: Current Outpatient Medications  Medication Sig Dispense Refill   hydrOXYzine (ATARAX) 25 MG tablet Take 1 tablet (25 mg total) by mouth 3 (three) times daily as needed for anxiety. 90 tablet 2   ibuprofen (ADVIL) 800 MG tablet Take 800 mg by mouth every 8 (eight) hours as needed.     ondansetron (ZOFRAN-ODT) 4 MG disintegrating tablet Take 1 tablet (4 mg total) by mouth every 8 (eight) hours as needed for nausea or vomiting. 30  tablet 1   Oxcarbazepine (TRILEPTAL) 300 MG tablet Take 1 tablet (300 mg total) by mouth 2 (two) times daily. 60 tablet 1   pantoprazole (PROTONIX) 40 MG tablet Take 1 tablet (40 mg total) by mouth daily. (Patient not taking: Reported on 10/23/2022) 30 tablet 3   traZODone (DESYREL) 100 MG tablet Take 1 tablet (100 mg total) by mouth at bedtime as needed for sleep (insomnia). 30 tablet 1   valACYclovir (VALTREX) 500 MG tablet Take 500 mg by mouth 2 (two) times daily.     No current facility-administered medications for this visit.     Psychiatric Specialty Exam: Review of Systems  There were no vitals taken for this visit.There is no height or weight on file to calculate BMI.  General Appearance: Fairly Groomed  Eye Contact:  Good  Speech:  Clear and Coherent and Normal Rate  Volume:  Normal  Mood:  Depressed  Affect:  Congruent  Thought Process:  Coherent, Goal Directed, and Linear  Orientation:  Full (Time, Place, and Person)  Thought Content: Logical   Suicidal Thoughts:  No  Homicidal Thoughts:  No  Memory:  NA  Judgement:  Good  Insight:  Fair  Psychomotor Activity:  Normal  Concentration:  Concentration: Fair  Recall:  Good  Fund of Knowledge: Good  Language: Good  Akathisia:  NA    AIMS (if indicated): not done  Assets:  Communication Skills Desire for Improvement Housing Talents/Skills Transportation Vocational/Educational  ADL's:  Intact  Cognition: WNL  Sleep:  Good   Metabolic Disorder Labs: Lab Results  Component Value Date   HGBA1C 5.1 02/12/2022   MPG 97 07/21/2012   MPG 94 04/01/2011   No results found for: "PROLACTIN" Lab Results  Component Value Date  CHOL 164 07/09/2019   TRIG 199 (H) 07/09/2019   HDL 38 (L) 07/09/2019   CHOLHDL 4.3 07/09/2019   VLDL 12 07/21/2012   LDLCALC 92 07/09/2019   LDLCALC 68 12/16/2015   Lab Results  Component Value Date   TSH 1.700 02/12/2022   TSH 1.030 07/09/2019    Therapeutic Level Labs: No results  found for: "LITHIUM" No results found for: "VALPROATE" No results found for: "CBMZ"   Screenings: AUDIT    Flowsheet Row ED to Hosp-Ad[facility] (Dis[facility]  Alcohol Use Disorder Identification Test Final Score (AUDIT) 0      GAD-7    Flowsheet Row Office Visit from 10/23/2022 in [facility] Office Visit from [facility]/03/2022 [facility] Office Visit from 02[facility]12/2022 in [facility] Office Visit from 01[facility]03/2022 in [facility] Office Visit from 09/06/2021 in [facility] at [facility]  Total GAD-7 Score 20 19 21 21 19       PHQ2-9    Flowsheet Row Office Visit from 10/23/2022 in [facility] Office Visit from 06/03/2022 in [facility] Office Visit from 02/12/2022 in [facility] Office Visit from 01/03/2022 in [facility] Office Visit from 09/06/2021 in [facility] at [facility]  PHQ-2 Total Score 3 3 2 5 4   PHQ-9 Total Score 15 13 11 24 20       Flowsheet Row ED from 07/24/2022 in [facility] Visit from 06/03/2022 [facility] ED from 01/17/2022 in [facility] at [facility]  C-SSRS RISK CATEGORY No Risk No Risk No Risk       Collaboration of Care: Collaboration of Care: Medication Management AEB medication prescription and Primary Care Provider AEB chart review  Patient/Guardian was advised Release of Information must be obtained prior to any record release in order to collaborate their care with an outside provider. Patient/Guardian was advised if they have not already done so to contact the  registration department to sign all necessary forms in order for [place] to release information regarding their care.   Consent: Patient/Guardian gives verbal consent for treatment and assignment of benefits for services provided during this visit. Patient/Guardian expressed understanding and agreed to proceed.    Virtual Visit via Video Note  I connected with Colleen Valentine on 06/12/23 at 10:00 AM EDT by a video enabled telemedicine application and verified that I am speaking with the correct person using two identifiers.  Location: Patient: Home Provider: Home Office   I discussed the limitations of evaluation and management by telemedicine and the availability of in person appointments. The patient expressed understanding and agreed to proceed.   I discussed the assessment and treatment plan with the patient. The patient was provided an opportunity to ask questions and all were answered. The patient agreed with the plan and demonstrated an understanding of the instructions.   The patient was advised to call back or seek an in-person evaluation if the symptoms worsen or if the condition fails to improve as anticipated.  I provided 10 minutes of non-face-to-face time during this encounter  Stasia Cavalier, MD 07/21/2023, 2:56 PM

## 2023-07-24 ENCOUNTER — Telehealth (HOSPITAL_COMMUNITY): Payer: MEDICAID | Admitting: Psychiatry

## 2023-07-24 ENCOUNTER — Ambulatory Visit (HOSPITAL_COMMUNITY): Payer: MEDICAID | Admitting: Physical Therapy

## 2023-07-30 ENCOUNTER — Ambulatory Visit (HOSPITAL_COMMUNITY): Payer: MEDICAID | Admitting: Clinical

## 2023-07-30 ENCOUNTER — Encounter (HOSPITAL_COMMUNITY): Payer: Self-pay | Admitting: Clinical

## 2023-07-30 DIAGNOSIS — F1211 Cannabis abuse, in remission: Secondary | ICD-10-CM | POA: Diagnosis not present

## 2023-07-30 DIAGNOSIS — F330 Major depressive disorder, recurrent, mild: Secondary | ICD-10-CM | POA: Diagnosis not present

## 2023-07-30 DIAGNOSIS — F909 Attention-deficit hyperactivity disorder, unspecified type: Secondary | ICD-10-CM

## 2023-07-30 DIAGNOSIS — F603 Borderline personality disorder: Secondary | ICD-10-CM

## 2023-07-30 DIAGNOSIS — Z8659 Personal history of other mental and behavioral disorders: Secondary | ICD-10-CM | POA: Diagnosis not present

## 2023-07-30 NOTE — Progress Notes (Signed)
 THERAPIST PROGRESS NOTE  Session Time: 4:10-4:54pm  Session #14  Virtual Visit via Video Note  I connected with Colleen Valentine on 07/30/23 at  4:00 PM EST by a video enabled telemedicine application and verified that I am speaking with the correct person using two identifiers.  Location: Patient: home Provider: [facility] office   I discussed the limitations of evaluation and management by telemedicine and the availability of in person appointments. The patient expressed understanding and agreed to proceed.  I discussed the assessment and treatment plan with the patient. The patient was provided an opportunity to ask questions and all were answered. The patient agreed with the plan and demonstrated an understanding of the instructions.   The patient was advised to call back or seek an in-person evaluation if the symptoms worsen or if the condition fails to improve as anticipated.  I provided 44 minutes of non-face-to-face time during this encounter.    Lynnell Chad, LCSW   Participation Level: Active  Behavioral Response: Casual Alert Euthymic  Type of Therapy: Individual Therapy  Treatment Goals addressed:     New goal implemented LTG: Score less than 9 on the PHQ-9 and less than 5 on the GAD-7 as evidenced by intermittent administration of the questionnaires to determine progress in managing depression and anxiety.  Goal: LTG: Increase coping skills to manage depression and improve ability to perform daily activities Goal: LTG: Will be able to identify 5-7 cognitive distortions she has and will learn how to come up with replacement thoughts that are more balanced, realistic, and helpful. Goal: STG: Learn coping skills and increase resilience through application of CBT techniques and through processing of life in a shame framework Goal: LTG: Recall traumatic events without becoming overwhelmed with negative emotions Goal: STG: [name]  will practice emotion regulation skills 5 time(s) per week for the next 26 week(s) Goal: LTG: Learn breathing techniques and grounding techniques and demonstrate mastery in session then report independent use of these skills out of session. Goal: LTG: Verbalize the situations that can easily trigger feelings of fear, depression, and anger Goal: LTG: Learn emotion regulation strategies and practice using them Goal: LTG: Learn distress tolerance skills and practice using them Goal: STG: [name] will develop a Plan of Action regarding her desire to stop smoking marijuana Goal: LTG: [name] will continue with her journey of sobriety by remaining abstinent from marijuana by using all coping skills at her disposal Goal: STG: [name] will learn about the harm of substances, will identify her triggers, and will create and implement a relapse prevention plan  ProgressTowards Goals: Progressing  Interventions: Supportive and Other: assessment, treatment planning    Summary: [name] is a 28 y.o. female who presents with Borderline Personality Disorder, depression, PTSD, and cannabis use disorder.  She presented oriented x5 and stated she was feeling "good, a lot has happened."  CSW evaluated patient's medication compliance, use of coping tools, and self-care, as applicable.   She shared at length about the various changes in her life since her last appointment.  She disclosed that she went through a deep depression and isolated herself.  The doctor did put her back on Trintillex, but she disclosed she is not taking it due to side-effects.  She does continue to take Trileptal, states it works well and she feels awful when she does not take it.  Patient explored the idea that she may have ADHD and/or Autism and stated that the benefit of finding out would be  that she could understand herself more and could develop more specific skills.  She asked to be referred for neuropsych testing again, as previously she had  herself taken off the list due to what she believed was a 5-year wait.    Part of patient's mental health over the last few months was affected by the death of her grandfather who raised her on 04/02/23.  She relapsed on marijuana at that time, initially smoking 5 joints daily, but now smoking just 1 joint at night.  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  She concluded that she now feels like she is contributing to the household, plus she has time with other adults and something of her own.  The case of assault against her daughter's grandmother was mediated and the charge dismissed.  She grew from this experience, having to keep quiet and listen rather than being defensive.  She expressed pride at her ability to tell the mediator the cognitive distortions she had at the time of the incident and how she worked through them to come to a more clear truth.  In the last few months when opportunities for anger have come, she has "watched my thoughts come and watched them go."  She described the current behavioral issues of her young son and daughter and how she is coping with those.  PHQ-9 and GAD-7 were administered to look at her progress in treatment.  Her PHQ-2 score was 1 so did not cascade into further evaluation questions.  Her GAD-7 score was 17 indicating severe anxiety.  She concurred with this results.  Suicidal/Homicidal: No  Therapist Response: Patient is progressing AEB engaging in scheduled therapy session.  Throughout the session, CSW gave patient the opportunity to explore thoughts and feelings associated with current life situations and past/present stressors.   CSW challenged patient gently and appropriately to consider different ways of looking at reported issues. CSW encouraged patient's expression of feelings and validated these using empathy, active listening, open body language, and unconditional  positive regard.   CSW encouraged patient to schedule more therapy sessions for the future, as needed.  We discussed spreading her sessions out to monthly now.      Recommendations:  Return to therapy in 2 weeks, then spread appointments out to be monthly since she did well without therapy for a period of time  Plan: Return to therapy on 3/19  Diagnosis: Mild episode of recurrent major depressive disorder (HCC)  Cannabis use disorder, mild, in early remission  Adult ADHD (attention deficit hyperactivity disorder)  History of posttraumatic stress disorder (PTSD)  Borderline personality disorder (HCC)   Collaboration of Care: Psychiatrist AEB - doctor and therapist can see notes in Epic  Patient/Guardian was advised Release of Information must be obtained prior to any record release in order to collaborate their care with an outside provider. Patient/Guardian was advised if they have not already done so to contact the registration department to sign all necessary forms in order for [place] to release information regarding their care.   Consent: Patient/Guardian gives verbal consent for treatment and assignment of benefits for services provided during this visit. Patient/Guardian expressed understanding and agreed to proceed.   Lynnell Chad, LCSW 07/30/2023     07/30/2023    4:38 PM 10/23/2022   12:08 PM 06/03/2022    5:09 PM 02/12/2022    9:36 AM 01/03/2022  2:16 PM  Depression screen PHQ 2/9  Decreased Interest 0 2 2 1 3   Down, Depressed, Hopeless 1 1 1 1 2   PHQ - 2 Score 1 3 3 2 5   Altered sleeping  1 3 3 3   Tired, decreased energy  3 3 3 3   Change in appetite  3 1 1 3   Feeling bad or failure about yourself   3 0 0 3  Trouble concentrating  1 3 1 3   Moving slowly or fidgety/restless  1 0 1 3  Suicidal thoughts  0 0 0 1  PHQ-9 Score  15 13 11 24   Difficult doing work/chores  Extremely dIfficult  Very difficult Extremely dIfficult      07/30/2023    4:39 PM 10/23/2022    12:08 PM 06/03/2022    5:09 PM 02/12/2022    9:37 AM  GAD 7 : Generalized Anxiety Score  Nervous, Anxious, on Edge 3 3 3 3   Control/stop worrying 2 3 3 3   Worry too much - different things 2 3 3 3   Trouble relaxing 3 3 3 3   Restless 2 2 1 3   Easily annoyed or irritable 2 3 3 3   Afraid - awful might happen 3 3 3 3   Total GAD 7 Score 17 20 19 21   Anxiety Difficulty Very difficult Extremely difficult

## 2023-08-12 ENCOUNTER — Ambulatory Visit (HOSPITAL_COMMUNITY): Payer: MEDICAID | Attending: Family Medicine

## 2023-08-12 ENCOUNTER — Encounter (HOSPITAL_COMMUNITY): Payer: Self-pay

## 2023-08-12 ENCOUNTER — Other Ambulatory Visit: Payer: Self-pay

## 2023-08-12 DIAGNOSIS — M542 Cervicalgia: Secondary | ICD-10-CM | POA: Diagnosis present

## 2023-08-12 DIAGNOSIS — M6281 Muscle weakness (generalized): Secondary | ICD-10-CM | POA: Insufficient documentation

## 2023-08-12 DIAGNOSIS — M546 Pain in thoracic spine: Secondary | ICD-10-CM | POA: Insufficient documentation

## 2023-08-12 DIAGNOSIS — M545 Low back pain, unspecified: Secondary | ICD-10-CM | POA: Insufficient documentation

## 2023-08-12 DIAGNOSIS — G8929 Other chronic pain: Secondary | ICD-10-CM | POA: Diagnosis present

## 2023-08-12 NOTE — Addendum Note (Signed)
 Addended by: Starling Manns D on: 08/12/2023 04:26 PM   Modules accepted: Orders

## 2023-08-12 NOTE — Therapy (Signed)
 OUTPATIENT PHYSICAL THERAPY EVALUATION   Patient Name: Colleen Valentine MRN: [id number] DOB:07/20/1995, 28 y.o., female Today's Date: 08/12/2023  END OF SESSION:  PT End of Session - 08/12/23 1610     Visit Number 1    Date for PT Re-Evaluation 09/15/23    Authorization Type [employer]    Authorization Time Period seeking auth    Progress Note Due on Visit 8    PT Start Time 1515    PT Stop Time 1604    PT Time Calculation (min) 49 min    Activity Tolerance Patient tolerated treatment well    Behavior During Therapy [facility] for tasks assessed/performed             Past Medical History:  Diagnosis Date   Acute pyelonephritis 05/17/2018   Anemia    Anxiety    Complication of anesthesia    Depression    GERD (gastroesophageal reflux disease)    Herpes genitalia    Insomnia    Obesity    Polysubstance abuse (HCC) 04/01/2011   PONV (postoperative nausea and vomiting)    Seizures (HCC)    once in 2016 due to alcohol poisioning   Suicide [facility])    Past Surgical History:  Procedure Laterality Date   CESAREAN SECTION N/A 08/30/2020   Procedure: CESAREAN SECTION;  Surgeon: Kathrynn Running, MD;  Location: [facility] LD ORS;  Service: Obstetrics;  Laterality: N/A;   CHOLECYSTECTOMY  2015   COSMETIC SURGERY  Age 53   dog bite to face   IR FLUORO GUIDED NEEDLE PLC ASPIRATION/INJECTION LOC  08/22/2020   IR US GUIDE BX ASP/DRAIN  08/22/2020   LAMINECTOMY N/A 08/22/2020   Procedure: THORACIC TWELVE TO LUMBAR ONELAMINECTOMY FOR DRAINAGE OF EPIDURAL ABSCESS;  Surgeon: Barnett Abu, MD;  Location: [facility] OR;  Service: Neurosurgery;  Laterality: N/A;   Patient Active Problem List   Diagnosis Date Noted   Mild episode of recurrent major depressive disorder (HCC) 12/18/2022   Pierced belly button infection 10/23/2022   Borderline personality disorder (HCC) 10/23/2022   Nausea and vomiting 10/23/2022   Yeast infection 08/07/2022   Hemorrhoids 08/07/2022   Depression, major, single episode,  moderate (HCC) 02/12/2022   Syncope 02/12/2022   Acute vaginitis 05/18/2021   Encounter for pregnancy test, result unknown 05/18/2021   Bloody stool 05/18/2021   PTSD (post-traumatic stress disorder) 10/05/2020   Irregular menses 09/05/2020   Epidural abscess    Vertebral osteomyelitis (HCC)    MRSA bacteremia    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/22/2020   Spinal cord compression (HCC) 08/21/2020   Splenomegaly 08/16/2020   Marijuana abuse 07/29/2020   Anemia in pregnancy 07/29/2020   Gestational diabetes mellitus in third trimester 07/29/2020   Gram-positive cocci bacteremia 07/20/2020   Abscess of axilla, left 07/19/2020   Marijuana user 03/28/2020   Gonorrhea 02/04/2020   Chlamydia infection 01/25/2020   Trichomonas infection 11/28/2019   Esophageal dysphagia 02/27/2017   Current smoker 08/12/2016   Genital herpes simplex 04/08/2016   GERD (gastroesophageal reflux disease) 10/17/2015   Scoliosis 05/04/2013   Spondylolysis 05/04/2013   Anxiety state 10/31/2012   ADHD (attention deficit hyperactivity disorder), combined type 07/21/2012   Oppositional defiant disorder 04/01/2011    PCP: Junie Spencer, FNP  REFERRING PROVIDER: Riley Nearing, PA-C  REFERRING DIAG: NECK PAIN,CHRONIC BILATERAL THORACIC BACK PAIN CHROINC MIDLINE LOW BACK PAIN WITHOUT SCIATICA   THERAPY DIAG:  Chronic neck pain  Chronic midline low back pain without sciatica  Chronic bilateral thoracic  back pain  Muscle weakness (generalized)  Rationale for Evaluation and Treatment: Rehabilitation  ONSET DATE: 03.2022  SUBJECTIVE:                                                                                                                                                                                                         SUBJECTIVE STATEMENT: Pt was 8 month pregnant and was having bowel/bladder issues. Found with infectious mass on Spinal Cord with corresponding surgery and then 5 days later had  emergency C-section. Pt reporting she has been living with this pain since the surgery. 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 Pt reports significant limitations interacting and care for children due to pain. Worst days, pain gets up 9/10 and muscles are spasmsing. Pt reports more concerns with cervical and thoracic spinal issues, low back does not give as much problem. Pt reports concerns with weight gain. Sleeps on left side at night.   Hand dominance: Right  PERTINENT HISTORY:  Severe anxiety  PAIN:  Are you having pain? Yes: NPRS scale: 6/10- worst is 9/10 Pain location: Currently in proximal cervical spine and thoracic spine  Pain description: Dull, sharp w/ random onset sharp pain. Constant dull pain.  Aggravating factors: Pt reports current weight and prolonged activities.  Relieving factors: Heating pad, ibuprofen, tylenol and prescribed medications.   PRECAUTIONS: None  RED FLAGS: Spinal tumors: Yes: Previous tumor, no longer active.      WEIGHT BEARING RESTRICTIONS: No  FALLS:  Has patient fallen in last 6 months? No   PATIENT GOALS: " reduce anxiety levels, I want to have full mobility of back and neck without pain stopping it."   OBJECTIVE:  Note: Objective measures were completed at Evaluation unless otherwise noted.  DIAGNOSTIC FINDINGS:    PATIENT SURVEYS:  NDI 19/50 = 38%  COGNITION: Overall cognitive status: Within functional limits for tasks assessed  SENSATION: Light touch: Hypersensitivity on RUE > LUE.   POSTURE: rounded shoulders and forward head  PALPATION: Tender to light palpation along upper trapezius and periscapular area.    CERVICAL ROM:   Active ROM A/PROM (deg) eval  Flexion 100%  Extension 100%  Right lateral flexion 85%  Left lateral flexion 60%*  Right rotation 90%  Left rotation 90%   (Blank rows = not  tested)  Painful* Pt cervically extends when rotating bilaterally.   UPPER EXTREMITY ROM:  Active ROM Right eval Left eval  Shoulder  flexion 180 180  Shoulder extension    Shoulder abduction 180 180  Shoulder adduction    Shoulder extension    Shoulder internal rotation    Shoulder external rotation 45 45  Elbow flexion    Elbow extension    Wrist flexion    Wrist extension    Wrist ulnar deviation    Wrist radial deviation    Wrist pronation    Wrist supination     (Blank rows = not tested)  UPPER EXTREMITY MMT:  MMT Right eval Left eval  Shoulder flexion 4- 4-  Shoulder extension    Shoulder abduction 4- 4-  Shoulder adduction    Shoulder extension    Shoulder internal rotation    Shoulder external rotation 4- 4-  Middle trapezius 3+ 3+  Lower trapezius 3+ 3+  Elbow flexion    Elbow extension    Wrist flexion    Wrist extension    Wrist ulnar deviation    Wrist radial deviation    Wrist pronation    Wrist supination    Grip strength     (Blank rows = not tested)  CERVICAL SPECIAL TESTS:  Neck flexor muscle endurance test: Positive  TREATMENT DATE:   08/12/2023 PT Evaluation                                                                                                                              Prone scapular slide with shoulder extension and cervical extension x 10'' Prone scapular Y's x 10'' Upper trapezius stretch x 30'' bilaterally   PATIENT EDUCATION:  Education details: PT Evaluation, findings, prognosis, frequency, attendance policy, and HEP. Person educated: Patient Education method: Medical illustrator Education comprehension: verbalized understanding  HOME EXERCISE PROGRAM: Access Code: [email or web address] URL: [email or web address] Date: 08/12/2023 Prepared by: Starling Manns  Exercises - Prone Scapular Slide with Shoulder Extension  - 1 x daily - 7 x weekly - 3 sets - 5 reps - 10 hold - Prone Scapular Retraction Y   - 1 x daily - 7 x weekly - 3 sets - 5 reps - 10 hold - Seated Upper Trapezius Stretch  - 1 x daily - 7 x weekly - 3 sets - 10 reps - Shoulder Roll Breathing  - 1 x daily - 7 x weekly - 3 sets - 10 reps  ASSESSMENT:  CLINICAL IMPRESSION: Patient is a 28 y.o. female who was seen today for physical therapy evaluation and treatment for NECK PAIN,CHRONIC BILATERAL THORACIC BACK PAIN CHROINC MIDLINE LOW BACK PAIN WITHOUT SCIATICA. PMH include spinal abscess, 8 months into pregnancy in 2022. 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 Pt has two children. Pt showing appropriate ROM in C-spine and bilateral shoulders but muscle weakness in postural musculature. Poor postural function, reduced muscle endurance, and chronic pain are limiting pt's functional status and significantly limiting pt's ADL based upon objective findings and outcome measures. Pt will benefit from skilled  Physical Therapy services to address deficits/limitations in order to improve functional and QOL.    OBJECTIVE IMPAIRMENTS: decreased activity tolerance, decreased strength, hypomobility, increased fascial restrictions, postural dysfunction, and pain.   ACTIVITY LIMITATIONS: carrying, lifting, sitting, sleeping, and reach over head  PARTICIPATION LIMITATIONS: shopping, community activity, and occupation  PERSONAL FACTORS: Age, Past/current experiences, and severe anxity  are also affecting patient's functional outcome.   REHAB POTENTIAL: Excellent  CLINICAL DECISION MAKING: Stable/uncomplicated  EVALUATION COMPLEXITY: Low   GOALS: Goals reviewed with patient? No  SHORT TERM GOALS: Target date: 08/26/2023  Pt will be independent with HEP in order to demonstrate participation in Physical Therapy POC.  Baseline: Goal status: INITIAL  2.  Pt will report 4/10 pain scale during UB ADLs in order to reduce pain and to optimize function.  Baseline:  Goal status: INITIAL  LONG TERM GOALS: Target date:  09/09/2023  Pt will improve Deep Neck Flexor Endurance test by 20 seconcds in order to demonstrate improved functional strength to return to desired activities.  Baseline: See objective.  Goal status: INITIAL  2.  Pt will improve NDI score by 10 points in order to demonstrate improved pain with functional goals and outcomes. Baseline: See objective.  Goal status: INITIAL  3.  Pt will report 2/10 pain scale during UB ADLs in order to reduce pain and to optimize function lasting greater than > 30 minutes.  Baseline: See objective.  Goal status: INITIAL  5.  Pt will improve UB MMT by at least 1/2 MMT in order to improve postural endurance musculature and capacity for functional vigorous ADLs. Baseline: See objective.  Goal status: INITIAL   PLAN:  PT FREQUENCY: 2x/week  PT DURATION: 4 weeks  PLANNED INTERVENTIONS: 97164- PT Re-evaluation, 97110-Therapeutic exercises, 97530- Therapeutic activity, 97112- Neuromuscular re-education, 97535- Self Care, 16109- Manual therapy, [id number]- Electrical stimulation (unattended), [phone]- Electrical stimulation (manual), [phone]- Traction (mechanical), Patient/Family education, Balance training, Stair training, Taping, Dry Needling, Joint mobilization, Joint manipulation, Spinal manipulation, Spinal mobilization, Cryotherapy, and Moist heat  PLAN FOR NEXT SESSION: Postural strengthening, UT soft tissue; endurance in UB musculature.   Nelida Meuse PT, DPT [facility] [place] [phone] office  Managed Medicaid Authorization Request  Visit Dx Codes: M54.2, [phone]  M62.81   Functional Tool Score: NDI = 38%  For all possible CPT codes, reference the Planned Interventions line above.     Check all conditions that are expected to impact treatment: {Conditions expected to impact treatment:Psychological or psychiatric disorders   If treatment provided at initial evaluation, no treatment charged due to lack of  authorization.      Nelida Meuse, PT 08/12/2023, 4:11 PM

## 2023-08-13 ENCOUNTER — Encounter (HOSPITAL_COMMUNITY): Payer: Self-pay | Admitting: Clinical

## 2023-08-13 ENCOUNTER — Ambulatory Visit (HOSPITAL_COMMUNITY): Payer: MEDICAID | Admitting: Clinical

## 2023-08-13 DIAGNOSIS — F603 Borderline personality disorder: Secondary | ICD-10-CM | POA: Diagnosis not present

## 2023-08-13 DIAGNOSIS — F909 Attention-deficit hyperactivity disorder, unspecified type: Secondary | ICD-10-CM | POA: Diagnosis not present

## 2023-08-13 DIAGNOSIS — F1211 Cannabis abuse, in remission: Secondary | ICD-10-CM

## 2023-08-13 DIAGNOSIS — Z8659 Personal history of other mental and behavioral disorders: Secondary | ICD-10-CM

## 2023-08-13 DIAGNOSIS — F330 Major depressive disorder, recurrent, mild: Secondary | ICD-10-CM

## 2023-08-13 NOTE — Progress Notes (Unsigned)
 THERAPIST PROGRESS NOTE  Session Time: 4:04-4:4:51pm  Session #15  Virtual Visit via Video Note  I connected with Colleen Valentine on 08/13/23 at  4:00 PM EDT by a video enabled telemedicine application and verified that I am speaking with the correct person using two identifiers.  Location: Patient: home Provider: [facility]   I discussed the limitations of evaluation and management by telemedicine and the availability of in person appointments. The patient expressed understanding and agreed to proceed.  I discussed the assessment and treatment plan with the patient. The patient was provided an opportunity to ask questions and all were answered. The patient agreed with the plan and demonstrated an understanding of the instructions.   The patient was advised to call back or seek an in-person evaluation if the symptoms worsen or if the condition fails to improve as anticipated.  I provided 47 minutes of non-face-to-face time during this encounter.    Lynnell Chad, LCSW   Participation Level: Active  Behavioral Response: Casual Alert Anxious and Depressed  Type of Therapy: Individual Therapy  Treatment Goals addressed:    *** Score less than 9 on the PHQ-9 and less than 5 on the GAD-7 as evidenced by intermittent administration of the questionnaires to determine progress in managing depression and anxiety.  LTG: Increase coping skills to manage depression and improve ability to perform daily activities LTG: Will be able to identify 5-7 cognitive distortions she has and will learn how to come up with replacement thoughts that are more balanced, realistic, and helpful. STG: Learn coping skills and increase resilience through application of CBT techniques and through processing of life in a shame framework LTG: Recall traumatic events without becoming overwhelmed with negative emotions STG: [name] will practice emotion regulation skills 5  time(s) per week for the next 26 week(s) LTG: Learn breathing techniques and grounding techniques and demonstrate mastery in session then report independent use of these skills out of session. LTG: Verbalize the situations that can easily trigger feelings of fear, depression, and anger LTG: Learn distress tolerance skills and practice using them STG: [name] will develop a Plan of Action regarding her desire to stop smoking marijuana LTG: [name] will continue with her journey of sobriety by remaining abstinent from marijuana by using all coping skills at her disposal STG: [name] will learn about the harm of substances, will identify her triggers, and will create and implement a relapse prevention plan  ProgressTowards Goals: Progressing  Interventions: CBT, Supportive, and Other: trauma-related symptoms review    Summary: Colleen Valentine is a 28 y.o. female who presents with Borderline Personality Disorder, depression, PTSD, and cannabis use disorder.  She presented oriented x5 and stated she was feeling "***."  CSW evaluated patient's medication compliance, use of coping tools, and self-care, as applicable.  She provided an update on various aspects of her life that are normally discussed in therapy, including ***   Suicidal/Homicidal: No  Therapist Response: Patient is progressing AEB engaging in scheduled therapy session.  Throughout the session, CSW gave patient the opportunity to explore thoughts and feelings associated with current life situations and past/present stressors.   CSW challenged patient gently and appropriately to consider different ways of looking at reported issues. CSW encouraged patient's expression of feelings and validated these using empathy, active listening, open body language, and unconditional positive regard.   CSW encouraged patient to schedule more therapy sessions for the future, as needed.  We scheduled one additional appointment but because she needs  4:00pm not  including Thursdays, the first available was 6/18 which is 3 months away.  We confirmed spreading her sessions out to monthly now.      Recommendations:  Return to therapy at next available appointment then every 4 weeks, refrain from telling daughter triggers to avoid power struggle, continue to use analysis of cognitive distortions as needed, use hand gestures for necessary communication with brain is frozen, ie. holding up finger to ask someone to wait while they catch breath  Plan: Return to therapy on 6/18  Diagnosis: Mild episode of recurrent major depressive disorder (HCC)  Cannabis use disorder, mild, in early remission  Adult ADHD (attention deficit hyperactivity disorder)  History of posttraumatic stress disorder (PTSD)  Borderline personality disorder (HCC)   Collaboration of Care: Psychiatrist AEB - doctor and therapist can see notes in Epic  Patient/Guardian was advised Release of Information must be obtained prior to any record release in order to collaborate their care with an outside provider. Patient/Guardian was advised if they have not already done so to contact the registration department to sign all necessary forms in order for [place] to release information regarding their care.   Consent: Patient/Guardian gives verbal consent for treatment and assignment of benefits for services provided during this visit. Patient/Guardian expressed understanding and agreed to proceed.   Lynnell Chad, LCSW 08/13/2023

## 2023-08-13 NOTE — Addendum Note (Signed)
 Addended by: Starling Manns D on: 08/13/2023 01:53 PM   Modules accepted: Orders

## 2023-08-15 ENCOUNTER — Telehealth (HOSPITAL_COMMUNITY): Payer: Self-pay

## 2023-08-15 NOTE — Telephone Encounter (Signed)
 Referral sent to [facility] - referral is in the chart

## 2023-08-25 NOTE — Progress Notes (Unsigned)
 [facility] MD/PA/NP OP Progress Note  08/28/2023 4:09 PM Colleen Valentine  MRN:  [id number]  Visit Diagnosis:    ICD-10-CM   1. Borderline personality disorder (HCC)  F60.3 Oxcarbazepine (TRILEPTAL) 300 MG tablet    2. Mild episode of recurrent major depressive disorder (HCC)  F33.0     3. Cannabis use disorder, mild, in early remission  F12.11       Assessment: Colleen Valentine is a 28 y.o. female with a history of oppositional defiant disorder, ADHD, anxiety, depression, borderline personality disorder, PTSD, and marijuana use disorder. who presented to [facility] for initial evaluation on 06/03/2022.    During initial evaluation patient reported symptoms of mood lability including depressed mood, irritability, anxiety, and elevated mood.  She also endorsed symptoms of impulsive behaviors, chronic feelings of emptiness, emotional instability, real or perceived fear of abandonment, dissociative symptoms, and a past history of trauma.  Of note patient did endorse a history of nonsuicidal self-injury and poor sense of self that had improved after connecting with CBT/DBT groups.  Based on patient's symptoms and her past history of borderline personality disorder it does appear she is having reoccurrence of the symptoms and would benefit from restarting on medications and reconnecting with therapy/groups.  Colleen Valentine presents for follow-up evaluation. Today, 08/28/23, patient reports improvement in her depressive symptoms in the interim.  Notably the depression was likely a grief response and with time patient has been able to process this.  Anxiety symptoms remain well-controlled with patient not requiring as needed hydroxyzine.  With this and improving depression patient opted not to restart Trintellix.  As she is currently stable and has had nausea and was medication in the past its discontinuation is appropriate.  She is still using marijuana has decreased the used only once at  night.  Patient does still plan to gradually work towards cessation.  We will continue on Trileptal 300 mg twice a day and follow up in 3 months.  Plan: - Continue Trileptal 300 mg BID - Discontinue Trintellix, patient has experienced nausea with this in the past and opted not to restart - Continue hydroxyzine 25 mg TID prn for anxiety - Continue trazodone 100 QHS prn for insomnia - Continue therapy with Colleen Valentine.  Colleen Valentine foundation information provided - Neuropsych testing referral for ADHD - CMP, CBC, iron panel, Vit B12, TSH, A1c reviewed - EKG from 02/12/22 reviewed QTC 398 - Crisis resources reviewed - Follow up in 3 months  Chief Complaint:  Chief Complaint  Patient presents with   Follow-up   HPI: On presentation today Colleen Valentine reports that she is doing good.  She continues to do well with her school, work, and taking care of her kids.  She had initially thought she would be overwhelmed but has found working full-time to be really beneficial for her.  Her depression has been doing fairly well and she has  gotten through the negative place she was at previously following the passing of her grandfather. She continued to remind herself of what his values were and what he would of wanted her.   Colleen Valentine's anxiety is still low, unchanged, without the need for her prn atarax. 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  Of note patient did not pass the skills exam though is not overly upset by it.  She is confident that she will pass it on her next attempt and has made plans to take it in  a time where she has less going on.  Patient did not restart the Trintellix in the interim for her depression as she had disliked the way it had made her feel nauseous in the past.  As her depressive symptoms have improved without it there is no need to consider restarting it today.  Patient's marijuana is continuing though she reports that the usage has decreased compared  to her initial relapse.  She is using it around once each evening now.  Past Psychiatric History: She has a past psychiatric history of oppositional defiant disorder, ADHD, anxiety, depression, borderline personality disorder, PTSD, Schizoaffective disorder, and marijuana use disorder.  At 16 she was in psychiatric residential facility following a suicide attempt after her mom passed. Learned CBT and DBT there that she carried with her and had helped since.  Most notably the cognitive re framing.  She has seen a therapist intermittently in the past  Medications- Pristiq, Zoloft, Paxil, Prozac, Lexapro, Wellbutrin, Cymbalta (palpitations), BuSpar, trintellix (nausea), trazodone, lamictal, seroquel, hydroxyzine, ativan, and gabapentin.  She endorsed daily marijuana at initial evaluation.  She reported it helped with anxiety symptoms.  She does note that it negatively impacts her concentration and increases her appetite.  Later on patient began to notice the adverse side effects of the medication and became sober.  She had a relapse in November 2024 for 2 weeks following the passing of her grandfather.  Since then she has been sober from all substances.  Had used alcohol in the past averaging a couple drinks a week. Cocaine use as a teenager   Past Medical History:  Past Medical History:  Diagnosis Date   Acute pyelonephritis 05/17/2018   Anemia    Anxiety    Complication of anesthesia    Depression    GERD (gastroesophageal reflux disease)    Herpes genitalia    Insomnia    Obesity    Polysubstance abuse (HCC) 04/01/2011   PONV (postoperative nausea and vomiting)    Seizures (HCC)    once in 2016 due to alcohol poisioning   Suicide [facility])     Past Surgical History:  Procedure Laterality Date   CESAREAN SECTION N/A 08/30/2020   Procedure: CESAREAN SECTION;  Surgeon: Kathrynn Running, MD;  Location: [facility] LD ORS;  Service: Obstetrics;  Laterality: N/A;   CHOLECYSTECTOMY  2015   COSMETIC  SURGERY  Age 76   dog bite to face   IR FLUORO GUIDED NEEDLE PLC ASPIRATION/INJECTION LOC  08/22/2020   IR US GUIDE BX ASP/DRAIN  08/22/2020   LAMINECTOMY N/A 08/22/2020   Procedure: THORACIC TWELVE TO LUMBAR ONELAMINECTOMY FOR DRAINAGE OF EPIDURAL ABSCESS;  Surgeon: Barnett Abu, MD;  Location: [facility] OR;  Service: Neurosurgery;  Laterality: N/A;   Family History:  Family History  Problem Relation Age of Onset   Alcohol abuse Mother    Stroke Mother    Bipolar disorder Mother    Drug abuse Mother    Alcohol abuse Father    Bipolar disorder Father    Drug abuse Father    Diabetes Father    Heart failure Father    Cancer Maternal Aunt        "stomach cancer"   Diabetes Maternal Aunt    Crohn's disease Paternal Aunt    Crohn's disease Paternal Aunt    Crohn's disease Paternal Uncle    Colon cancer Paternal Uncle    Breast cancer Maternal Grandmother    Colon cancer Maternal Grandfather    Diabetes type II  Paternal Grandfather    Heart failure Paternal Grandfather    Celiac disease Neg Hx    Cervical cancer Neg Hx    Lung cancer Neg Hx     Social History:  Social History   Socioeconomic History   Marital status: Single    Spouse name: Not on file   Number of children: 2   Years of education: Not on file   Highest education level: GED or equivalent  Occupational History   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    Comment:    Tobacco Use   Smoking status: Former    Current packs/day: 0.00    Average packs/day: 0.5 packs/day for 5.0 years (2.5 ttl pk-yrs)    Types: Cigarettes    Start date: 11/24/2014    Quit date: 11/24/2019    Years since quitting: 3.7   Smokeless tobacco: Never   Tobacco comments:    one pack cigarettes daily  Vaping Use   Vaping status: Every Day   Start date: 08/26/2019  Substance and Sexual Activity   Alcohol use: Yes    Comment: social   Drug use: Yes    Types: Marijuana    Comment: every other day   Sexual activity: Yes     Partners: Male    Birth control/protection: Other-see comments    Comment: female partner  Other Topics Concern   Not on file  Social History Narrative   Lives with her dad and her two children, will be going to [employer] in the spring. Works ar [employer].    Social Drivers of Corporate investment banker Strain: Low Risk  (06/11/2023)   Received from [facility], [facility]   Overall Financial Resource Strain (CARDIA)    Difficulty of Paying Living Expenses: Not hard at all  Food Insecurity: No Food Insecurity (06/11/2023)   Received from [facility]   Hunger Vital Sign    Worried About Running Out of Food in the Last Year: Never true    Ran Out of Food in the Last Year: Never true  Transportation Needs: No Transportation Needs (06/11/2023)   Received from [facility], [facility]   PRAPARE - Transportation    Lack of Transportation (Medical): No    Lack of Transportation (Non-Medical): No  Physical Activity: Unknown (06/11/2023)   [facility] from [facility], [facility]   Exercise Vital Sign    Days of Exercise per Week: 0 days    Minutes of Exercise per Session: Not on file  Stress: Stress Concern Present (06/11/2023)   Received from [facility], [facility] - Occupational Stress Questionnaire    Feeling of Stress : Very much  Social Connections: Somewhat Isolated (06/11/2023)   Received from [facility], [facility]   Social Network    How would you rate your social network (family, work, friends)?: Restricted participation with some degree of social isolation    Allergies: No Known Allergies  Current Medications: Current Outpatient Medications  Medication Sig Dispense Refill   hydrOXYzine (ATARAX) 25 MG tablet Take 1 tablet (25 mg total) by mouth 3 (three) times daily as needed for anxiety. 90 tablet 2   ibuprofen (ADVIL) 800 MG tablet Take 800 mg by mouth every 8 (eight) hours as  needed.     ondansetron (ZOFRAN-ODT) 4 MG disintegrating tablet Take 1 tablet (4 mg total) by mouth every 8 (eight) hours as needed for nausea or vomiting. 30 tablet  1   Oxcarbazepine (TRILEPTAL) 300 MG tablet Take 1 tablet (300 mg total) by mouth 2 (two) times daily. 180 tablet 0   pantoprazole (PROTONIX) 40 MG tablet Take 1 tablet (40 mg total) by mouth daily. (Patient not taking: Reported on 10/23/2022) 30 tablet 3   traZODone (DESYREL) 100 MG tablet Take 1 tablet (100 mg total) by mouth at bedtime as needed for sleep (insomnia). 30 tablet 1   valACYclovir (VALTREX) 500 MG tablet Take 500 mg by mouth 2 (two) times daily.     No current facility-administered medications for this visit.     Psychiatric Specialty Exam: Review of Systems  There were no vitals taken for this visit.There is no height or weight on file to calculate BMI.  General Appearance: Fairly Groomed  Eye Contact:  Good  Speech:  Clear and Coherent and Normal Rate  Volume:  Normal  Mood:  Depressed  Affect:  Congruent  Thought Process:  Coherent, Goal Directed, and Linear  Orientation:  Full (Time, Place, and Person)  Thought Content: Logical   Suicidal Thoughts:  No  Homicidal Thoughts:  No  Memory:  NA  Judgement:  Good  Insight:  Fair  Psychomotor Activity:  Normal  Concentration:  Concentration: Fair  Recall:  Good  Fund of Knowledge: Good  Language: Good  Akathisia:  NA    AIMS (if indicated): not done  Assets:  Communication Skills Desire for Improvement Housing Talents/Skills Transportation Vocational/Educational  ADL's:  Intact  Cognition: WNL  Sleep:  Good   Metabolic Disorder Labs: Lab Results  Component Value Date   HGBA1C 5.1 02/12/2022   MPG 97 07/21/2012   MPG 94 04/01/2011   No results found for: "PROLACTIN" Lab Results  Component Value Date   CHOL 164 07/09/2019   TRIG 199 (H) 07/09/2019   HDL 38 (L) 07/09/2019   CHOLHDL 4.3 07/09/2019   VLDL 12 07/21/2012   LDLCALC 92  07/09/2019   LDLCALC 68 12/16/2015   Lab Results  Component Value Date   TSH 1.700 02/12/2022   TSH 1.030 07/09/2019    Therapeutic Level Labs: No results found for: "LITHIUM" No results found for: "VALPROATE" No results found for: "CBMZ"   Screenings: AUDIT    Flowsheet Row ED to Hosp-Ad[facility] (Dis[facility]) [facility]  Alcohol Use Disorder Identification Test Final Score (AUDIT) 0      GAD-7    Flowsheet Row Counselor from 07/30/2023 in [facility] Visit from 10/23/2022 in [facility] Visit from [facility] Visit from 02/12/2022 in [facility] Visit from 01/03/2022 in [facility]  Total GAD-7 Score 17 20 19 21 21       PHQ2-9    Flowsheet Row Counselor from 07/30/2023 in [facility] Visit from 10[facility]23/2022 in [facility] Visit from 06/03/2022 in [facility] Visit from 02/12/2022 in [facility] Visit from 01/03/2022 in [facility]  PHQ-2 Total Score 1 3 3 2 5   PHQ-9 Total Score -- 15 13 11 24       Flowsheet Row ED from 07/24/2022 in [facility] Visit from 06/03/2022 [facility] from 01/17/2022 in [facility] Emergency  Department at [facility]  C-SSRS RISK CATEGORY No Risk No Risk No Risk       Collaboration of Care: Collaboration of Care: Medication Management AEB medication prescription and Referral or follow-up with counselor/therapist AEB chart review  Patient/Guardian was advised Release of Information must be  obtained prior to any record release in order to collaborate their care with an outside provider. Patient/Guardian was advised if they have not already done so to contact the registration department to sign all necessary forms in order for [place] to release information regarding their care.   Consent: Patient/Guardian gives verbal consent for treatment and assignment of benefits for services provided during this visit. Patient/Guardian expressed understanding and agreed to proceed.    Virtual Visit via Video Note  I connected with [name] on 08/28/23 at 4:00 PM EDT by a video enabled telemedicine application and verified that I am speaking with the correct person using two identifiers.  Location: Patient: Home Provider: Home Office   I discussed the limitations of evaluation and management by telemedicine and the availability of in person appointments. The patient expressed understanding and agreed to proceed.   I discussed the assessment and treatment plan with the patient. The patient was provided an opportunity to ask questions and all were answered. The patient agreed with the plan and demonstrated an understanding of the instructions.   The patient was advised to call back or seek an in-person evaluation if the symptoms worsen or if the condition fails to improve as anticipated.  I provided 10 minutes of non-face-to-face time during this encounter  Stasia Cavalier, MD 08/28/2023, 4:09 PM

## 2023-08-27 ENCOUNTER — Encounter (HOSPITAL_COMMUNITY): Payer: Self-pay

## 2023-08-27 ENCOUNTER — Encounter (HOSPITAL_COMMUNITY): Payer: MEDICAID

## 2023-08-27 NOTE — Therapy (Signed)
 [facility] Outpatient Rehabilitation at [facility] [address] [place], [place], [address] Phone: [phone]   Fax:  [phone]  Patient Details  Name: Colleen Valentine MRN: [id number] Date of Birth: 06-Nov-1995 Referring Provider:  No ref. provider found  Encounter Date: 08/27/2023  PT called pt to follow up on missing scheduled appointment this afternoon at 3:15. Was not able to leave message due to voicemail box being full. Hopeful pt will call front desk ASAP.  Luz Lex, PT 08/27/2023, 3:56 PM  [facility] Outpatient Rehabilitation at [facility] [address] [place], [place], [address] Phone: [phone]   Fax:  [phone]

## 2023-08-28 ENCOUNTER — Telehealth (HOSPITAL_COMMUNITY): Payer: MEDICAID | Admitting: Psychiatry

## 2023-08-28 ENCOUNTER — Encounter (HOSPITAL_COMMUNITY): Payer: Self-pay | Admitting: Psychiatry

## 2023-08-28 DIAGNOSIS — F330 Major depressive disorder, recurrent, mild: Secondary | ICD-10-CM

## 2023-08-28 DIAGNOSIS — F603 Borderline personality disorder: Secondary | ICD-10-CM

## 2023-08-28 DIAGNOSIS — F1211 Cannabis abuse, in remission: Secondary | ICD-10-CM | POA: Diagnosis not present

## 2023-08-28 MED ORDER — OXCARBAZEPINE 300 MG PO TABS: 300.0000 mg | ORAL_TABLET | Freq: Two times a day (BID) | ORAL | 0 refills | Status: DC

## 2023-09-02 ENCOUNTER — Encounter (HOSPITAL_COMMUNITY): Payer: MEDICAID | Admitting: Physical Therapy

## 2023-09-05 ENCOUNTER — Encounter (HOSPITAL_COMMUNITY): Payer: MEDICAID

## 2023-09-05 ENCOUNTER — Telehealth (HOSPITAL_COMMUNITY): Payer: Self-pay

## 2023-09-05 NOTE — Telephone Encounter (Signed)
 2nd no show, called pt concerning missed apt with no answer. Was able to leave a message on voicemail. Reminded next apt date and time and requested a call to cancel/reschedule if unable to make. Included no show policy details in message.   Becky Sax, LPTA/CLT; [place] [phone]

## 2023-09-10 ENCOUNTER — Encounter (HOSPITAL_COMMUNITY): Payer: MEDICAID

## 2023-09-10 ENCOUNTER — Encounter (HOSPITAL_COMMUNITY): Payer: Self-pay

## 2023-09-10 NOTE — Therapy (Signed)
 [facility] Outpatient Rehabilitation at [facility] [address] [place], [place], [address] Phone: [phone]   Fax:  [phone]  Patient Details  Name: Colleen Valentine MRN: [id number] Date of Birth: [birth year] Referring Provider:  No ref. provider found  Encounter Date: 09/10/2023  Pt was called concerning her 3:15PM appointment this afternoon. Pt reports visiting stepmothers grave today and forgetting all about the appointment. Pt was reminded of the no show policy and will look forward to seeing her next appointment time.  Armond Bertin, PT, DPT [facility] Office: [phone] 4:19 PM, 09/10/23  [facility] Outpatient Rehabilitation at [facility] [address] [place], [place], [address] Phone: [phone]   Fax:  [phone]

## 2023-09-17 ENCOUNTER — Telehealth (HOSPITAL_COMMUNITY): Payer: Self-pay

## 2023-09-17 DIAGNOSIS — F603 Borderline personality disorder: Secondary | ICD-10-CM

## 2023-09-17 NOTE — Telephone Encounter (Signed)
 Patient is calling, she is in distress not knowing what is going on with herself. Patient states that she has been having more outbursts, anger and aggression. She said she feels like she can not control her emotions. She does not think it is depression or anxiety - she would like a call back please.

## 2023-09-17 NOTE — Telephone Encounter (Signed)
 Patient was contacted via telephone for approximately 15 minutes.  Janneth reports that she is having a really hard time controlling her impulses lability recently. She has been dealing with increased irritability, sadness, frustration, and feelings of being overwhelmed.  The symptoms onset rapidly in a matter of seconds to minutes.  [name] has been unable to tell that the symptoms are coming on until she explodes.  This morning an episode happened and she yelled at her kids which she feels very bad about.  Of note patient is feeling very overwhelmed with the stressors in her life.  In addition to caring for her kids, working, and going to school she has also had the increased stress of dealing with her father's poor care.  He has been staying in a nursing facility where she feels like he has been neglected.  Now today he has been hospitalized and is going to have an amputation of his leg.  We reviewed the importance of taking time to ground herself throughout the day and working on breathing exercises and taking a step back from everything that is going on.  Suggested that patient take 5 to 10 minutes every hour to focus on breathing and cognitive reframing what her goals are for the immediate future with increments of an hour or 2.  We recommended not trying to complete all her tasks at once as that is where she starts to get overwhelmed.  With the symptoms ongoing Yolande did identify that taking time is important.  This past week and she went to the beach and felt much better 2 days that she was there.  In addition to discussing grounding techniques we recommended moving her appointment up with her therapist and setting up quicker follow-up with this provider.  We will also increase Trileptal  450 mg twice a day to manage mood lability symptoms.  Risk and benefits were reviewed.

## 2023-09-24 NOTE — Progress Notes (Signed)
 [facility] MD/PA/NP OP Progress Note  09/26/2023 9:57 AM Colleen Valentine  MRN:  [id number]  Visit Diagnosis:    ICD-10-CM   1. Borderline personality disorder (HCC)  F60.3     2. Mild episode of recurrent major depressive disorder (HCC)  F33.0        Assessment: Colleen Valentine is a 28 y.o. female with a history of oppositional defiant disorder, ADHD, anxiety, depression, borderline personality disorder, PTSD, and marijuana use disorder. who presented to [facility] at [facility] for initial evaluation on 06/03/2022.    During initial evaluation patient reported symptoms of mood lability including depressed mood, irritability, anxiety, and elevated mood.  She also endorsed symptoms of impulsive behaviors, chronic feelings of emptiness, emotional instability, real or perceived fear of abandonment, dissociative symptoms, and a past history of trauma.  Of note patient did endorse a history of nonsuicidal self-injury and poor sense of self that had improved after connecting with CBT/DBT groups.  Based on patient's symptoms and her past history of borderline personality disorder it does appear she is having reoccurrence of the symptoms and would benefit from restarting on medications and reconnecting with therapy/groups.  Colleen Valentine presents for follow-up evaluation. Today, 09/26/23, patient reports that her mood lability which she had reached out about in the interim has improved significantly after increasing Trileptal  to 450 mg twice a day.  She denies any adverse side effects from the increase.  That being the case we will continue on this dose.  She also has not needed to use the as needed medications with the increase in Trileptal .  Patient is using marijuana at night and recommendation for cessation was made.  We will follow up in 2 months.  Plan: - Continue Trileptal  450 mg BID - Discontinue Trintellix , patient has experienced nausea with this in the past and opted not to restart -  Continue hydroxyzine  25 mg TID prn for anxiety - Continue trazodone  100 QHS prn for insomnia - Continue therapy with Colleen Valentine.  - Colleen Valentine information provided - Neuropsych testing referral for ADHD - CMP, CBC, iron  panel, Vit B12, TSH, A1c reviewed - EKG from 02/12/22 reviewed QTC 398 - Crisis resources reviewed - Follow up in 2 months  Chief Complaint:  Chief Complaint  Patient presents with   Follow-up   HPI: Patient had reached on the interim with concerns documented in the chart.  Notably Trileptal  was increased to 450 mg twice a day at that time.  On presentation today [name] reports that things have been going great the last week and a half. She noticed an improvement the day after the medication was increased.  With that she reports a decrease in her mood lability and irritability symptoms.  She is also not feeling as overwhelmed or stressed with everything going on.  For instance patient handled her father's amputation well and did not become excessively irritable about the neglect that led to him needing it.  Instead she feels she handled the situation very professionally.  In the same sense she is also handling her son potentially having autism well.  He is undergoing testing but she is not letting that get her down or depressed.  Patient denied any adverse side effects from the increase in Trileptal .  She notes that she also started taking the evening dose a couple hours earlier around 5-6 and has found that to be beneficial as well.  Patient reports she has not needed to use the as needed medications since last Thursday.  She does report that she is using marijuana during the evening.  Patient reports only using around 1 night.  There have also been a couple nights recently where she has not used it at all.  We reviewed the risk of marijuana and its potential effects on anxiety and mood symptoms.  Past Psychiatric History: She has a past psychiatric history of oppositional  defiant disorder, ADHD, anxiety, depression, borderline personality disorder, PTSD, Schizoaffective disorder, and marijuana use disorder.  At 16 she was in psychiatric residential facility following a suicide attempt after her mom passed. Learned CBT and DBT there that she carried with her and had helped since.  Most notably the cognitive re framing.  She has seen a therapist intermittently in the past  Medications- Pristiq , Zoloft , Paxil, Prozac, Lexapro , Wellbutrin , Cymbalta  (palpitations), BuSpar , trintellix  (nausea), trazodone , lamictal , seroquel, hydroxyzine , ativan , and gabapentin .  She endorsed daily marijuana at initial evaluation.  She reported it helped with anxiety symptoms.  She does note that it negatively impacts her concentration and increases her appetite.  Later on patient began to notice the adverse side effects of the medication and became sober.  She had a relapse in November 2024 for 2 weeks following the passing of her grandfather.  Since then she has been sober from all substances.  Had used alcohol in the past averaging a couple drinks a week. Cocaine use as a teenager   Past Medical History:  Past Medical History:  Diagnosis Date   Acute pyelonephritis 05/17/2018   Anemia    Anxiety    Complication of anesthesia    Depression    GERD (gastroesophageal reflux disease)    Herpes genitalia    Insomnia    Obesity    Polysubstance abuse (HCC) 04/01/2011   PONV (postoperative nausea and vomiting)    Seizures (HCC)    once in 2016 due to alcohol poisioning   Suicide [facility])     Past Surgical History:  Procedure Laterality Date   CESAREAN SECTION N/A 08/30/2020   Procedure: CESAREAN SECTION;  Surgeon: Janeane Mealy, MD;  Location: [facility] LD ORS;  Service: Obstetrics;  Laterality: N/A;   CHOLECYSTECTOMY  2015   COSMETIC SURGERY  Age 89   dog bite to face   IR FLUORO GUIDED NEEDLE PLC ASPIRATION/INJECTION LOC  08/22/2020   IR US  GUIDE BX ASP/DRAIN  08/22/2020   LAMINECTOMY  N/A 08/22/2020   Procedure: THORACIC TWELVE TO LUMBAR ONELAMINECTOMY FOR DRAINAGE OF EPIDURAL ABSCESS;  Surgeon: Elna Haggis, MD;  Location: [facility] OR;  Service: Neurosurgery;  Laterality: N/A;   Family History:  Family History  Problem Relation Age of Onset   Alcohol abuse Mother    Stroke Mother    Bipolar disorder Mother    Drug abuse Mother    Alcohol abuse Father    Bipolar disorder Father    Drug abuse Father    Diabetes Father    Heart failure Father    Cancer Maternal Aunt        "stomach cancer"   Diabetes Maternal Aunt    Crohn's disease Paternal Aunt    Crohn's disease Paternal Aunt    Crohn's disease Paternal Uncle    Colon cancer Paternal Uncle    Breast cancer Maternal Grandmother    Colon cancer Maternal Grandfather    Diabetes type II Paternal Grandfather    Heart failure Paternal Grandfather    Celiac disease Neg Hx    Cervical cancer Neg Hx    Lung cancer Neg Hx  Social History:  Social History   Socioeconomic History   Marital status: Single    Spouse name: Not on file   Number of children: 2   Years of education: Not on file   Highest education level: GED or equivalent  Occupational History   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    Comment:    Tobacco Use   Smoking status: Former    Current packs/day: 0.00    Average packs/day: 0.5 packs/day for 5.0 years (2.5 ttl pk-yrs)    Types: Cigarettes    Start date: 11/24/2014    Quit date: 11/24/2019    Years since quitting: 3.8   Smokeless tobacco: Never   Tobacco comments:    one pack cigarettes daily  Vaping Use   Vaping status: Every Day   Start date: 08/26/2019  Substance and Sexual Activity   Alcohol use: Yes    Comment: social   Drug use: Yes    Types: Marijuana    Comment: every other day   Sexual activity: Yes    Partners: Male    Birth control/protection: Other-see comments    Comment: female partner  Other Topics Concern   Not on file  Social History Narrative    Lives with her dad and her two children, will be going to [employer] in the spring. Works ar [employer].    Social Drivers of Corporate investment banker Strain: Low Risk  (06/11/2023)   Received from [facility]   Overall Financial Resource Strain (CARDIA)    Difficulty of Paying Living Expenses: Not hard at all  Food Insecurity: No Food Insecurity (06/11/2023)   Received from [facility]   Hunger Vital Sign    Worried About Running Out of Food in the Last Year: Never true    Ran Out of Food in the Last Year: Never true  Transportation Needs: No Transportation Needs (06/11/2023)   Received from [facility]   PRAPARE - Transportation    Lack of Transportation (Medical): No    Lack of Transportation (Non-Medical): No  Physical Activity: Unknown (06/11/2023)   Received from [facility]   Exercise Vital Sign    Days of Exercise per Week: 0 days    Minutes of Exercise per Session: Not on file  Stress: Stress Concern Present (06/11/2023)   Received from [facility] - Occupational Stress Questionnaire    Feeling of Stress : Very much  Social Connections: Somewhat Isolated (06/11/2023)   Received from [facility]   Social Network    How would you rate your social network (family, work, friends)?: Restricted participation with some degree of social isolation    Allergies: No Known Allergies  Current Medications: Current Outpatient Medications  Medication Sig Dispense Refill   hydrOXYzine  (ATARAX ) 25 MG tablet Take 1 tablet (25 mg total) by mouth 3 (three) times daily as needed for anxiety. 90 tablet 2   ibuprofen  (ADVIL ) 800 MG tablet Take 800 mg by mouth every 8 (eight) hours as needed.     ondansetron  (ZOFRAN -ODT) 4 MG disintegrating tablet Take 1 tablet (4 mg total) by mouth every 8 (eight) hours as needed for nausea or vomiting. 30  tablet 1   Oxcarbazepine  (TRILEPTAL ) 300 MG tablet Take 1 tablet (300 mg total) by mouth 2 (two) times daily. 180 tablet 0   pantoprazole  (PROTONIX ) 40 MG tablet Take 1 tablet (40  mg total) by mouth daily. (Patient not taking: Reported on 10/23/2022) 30 tablet 3   traZODone  (DESYREL ) 100 MG tablet Take 1 tablet (100 mg total) by mouth at bedtime as needed for sleep (insomnia). 30 tablet 1   valACYclovir  (VALTREX ) 500 MG tablet Take 500 mg by mouth 2 (two) times daily.     No current facility-administered medications for this visit.     Psychiatric Specialty Exam: Review of Systems  There were no vitals taken for this visit.There is no height or weight on file to calculate BMI.  General Appearance: Fairly Groomed  Eye Contact:  Good  Speech:  Clear and Coherent and Normal Rate  Volume:  Normal  Mood:  Euthymic  Affect:  Congruent  Thought Process:  Coherent, Goal Directed, and Linear  Orientation:  Full (Time, Place, and Person)  Thought Content: Logical   Suicidal Thoughts:  No  Homicidal Thoughts:  No  Memory:  NA  Judgement:  Good  Insight:  Fair  Psychomotor Activity:  Normal  Concentration:  Concentration: Fair  Recall:  Good  Fund of Knowledge: Good  Language: Good  Akathisia:  NA    AIMS (if indicated): not done  Assets:  Communication Skills Desire for Improvement Housing Talents/Skills Transportation Vocational/Educational  ADL's:  Intact  Cognition: WNL  Sleep:  Good   Metabolic Disorder Labs: Lab Results  Component Value Date   HGBA1C 5.1 02/12/2022   MPG 97 07/21/2012   MPG 94 04/01/2011   No results found for: "PROLACTIN" Lab Results  Component Value Date   CHOL 164 07/09/2019   TRIG 199 (H) 07/09/2019   HDL 38 (L) 07/09/2019   CHOLHDL 4.3 07/09/2019   VLDL 12 07/21/2012   LDLCALC 92 07/09/2019   LDLCALC 68 12/16/2015   Lab Results  Component Value Date   TSH 1.700 02/12/2022   TSH 1.030 07/09/2019    Therapeutic Level Labs: No results  found for: "LITHIUM" No results found for: "VALPROATE" No results found for: "CBMZ"   Screenings: AUDIT    Flowsheet Row ED to Hosp-Ad[facility] (Dis[facility]) [facility]  Alcohol Use Disorder Identification Test Final Score (AUDIT) 0      GAD-7    Flowsheet Row Counselor from 07/30/2023 in [facility] Visit from 10[facility]23/2022 in [facility] Visit from [facility] Visit from 02/12/2022 in [facility] Visit from 01/03/2022 in [facility]  Total GAD-7 Score 17 20 19 21 21       PHQ2-9    Flowsheet Row Counselor from 07/30/2023 in [facility] Visit from 10/23/2022 in [facility] Visit from 06/03/2022 [facility] Visit from 02[facility]12/2022 in [facility] Visit from 01/03/2022 in [facility]  PHQ-2 Total Score 1 3 3 2 5   PHQ-9 Total Score -- 15 13 11 24       Flowsheet Row ED from 07/24/2022 in [facility] Visit from 06[facility]03/2022 in [facility] from 01/17/2022 in [facility] at [facility]  C-SSRS RISK CATEGORY No Risk No Risk No Risk       Collaboration of Care: Collaboration of Care: Medication Management AEB medication prescription and Referral  or follow-up with counselor/therapist AEB chart review  Patient/Guardian was advised Release of Information must be obtained prior to any record release in order to collaborate their care with an outside provider. Patient/Guardian was advised if they have not already done so to  contact the registration department to sign all necessary forms in order for us  to release information regarding their care.   Consent: Patient/Guardian gives verbal consent for treatment and assignment of benefits for services provided during this visit. Patient/Guardian expressed understanding and agreed to proceed.    Virtual Visit via Video Note  I connected with Lauris Port on 08/28/23 at 4:00 PM EDT by a video enabled telemedicine application and verified that I am speaking with the correct person using two identifiers.  Location: Patient: Home Provider: Home Office   I discussed the limitations of evaluation and management by telemedicine and the availability of in person appointments. The patient expressed understanding and agreed to proceed.   I discussed the assessment and treatment plan with the patient. The patient was provided an opportunity to ask questions and all were answered. The patient agreed with the plan and demonstrated an understanding of the instructions.   The patient was advised to call back or seek an in-person evaluation if the symptoms worsen or if the condition fails to improve as anticipated.  I provided 10 minutes of non-face-to-face time during this encounter  Yves Herb, MD 09/26/2023, 9:57 AM

## 2023-09-26 ENCOUNTER — Telehealth (HOSPITAL_BASED_OUTPATIENT_CLINIC_OR_DEPARTMENT_OTHER): Payer: MEDICAID | Admitting: Psychiatry

## 2023-09-26 ENCOUNTER — Encounter (HOSPITAL_COMMUNITY): Payer: Self-pay | Admitting: Psychiatry

## 2023-09-26 DIAGNOSIS — F330 Major depressive disorder, recurrent, mild: Secondary | ICD-10-CM | POA: Diagnosis not present

## 2023-09-26 DIAGNOSIS — F603 Borderline personality disorder: Secondary | ICD-10-CM | POA: Diagnosis not present

## 2023-09-26 MED ORDER — OXCARBAZEPINE 300 MG PO TABS: 450.0000 mg | ORAL_TABLET | Freq: Two times a day (BID) | ORAL | 0 refills | Status: DC

## 2023-10-09 ENCOUNTER — Ambulatory Visit (HOSPITAL_COMMUNITY): Payer: MEDICAID | Admitting: Clinical

## 2023-11-04 ENCOUNTER — Ambulatory Visit (INDEPENDENT_AMBULATORY_CARE_PROVIDER_SITE_OTHER): Payer: MEDICAID | Admitting: Clinical

## 2023-11-04 ENCOUNTER — Encounter (HOSPITAL_COMMUNITY): Payer: Self-pay | Admitting: Clinical

## 2023-11-04 DIAGNOSIS — F330 Major depressive disorder, recurrent, mild: Secondary | ICD-10-CM

## 2023-11-04 DIAGNOSIS — F1211 Cannabis abuse, in remission: Secondary | ICD-10-CM

## 2023-11-04 DIAGNOSIS — F603 Borderline personality disorder: Secondary | ICD-10-CM

## 2023-11-04 DIAGNOSIS — Z8659 Personal history of other mental and behavioral disorders: Secondary | ICD-10-CM

## 2023-11-04 DIAGNOSIS — F909 Attention-deficit hyperactivity disorder, unspecified type: Secondary | ICD-10-CM
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
## 2023-11-12 ENCOUNTER — Ambulatory Visit (HOSPITAL_COMMUNITY): Payer: MEDICAID | Admitting: Clinical

## 2023-11-24 NOTE — Progress Notes (Unsigned)
 [facility] MD/PA/NP OP Progress Note  11/27/2023 3:59 PM Colleen Valentine  MRN:  [id number]  Visit Diagnosis:    ICD-10-CM   1. Borderline personality disorder (HCC)  F60.3 Oxcarbazepine  (TRILEPTAL ) 300 MG tablet      Assessment: Colleen Valentine is a 28 y.o. female with a history of oppositional defiant disorder, ADHD, anxiety, depression, borderline personality disorder, PTSD, and marijuana use disorder. who presented to [facility] at [facility] for initial evaluation on 06/03/2022.    During initial evaluation patient reported symptoms of mood lability including depressed mood, irritability, anxiety, and elevated mood.  She also endorsed symptoms of impulsive behaviors, chronic feelings of emptiness, emotional instability, real or perceived fear of abandonment, dissociative symptoms, and a past history of trauma.  Of note patient did endorse a history of nonsuicidal self-injury and poor sense of self that had improved after connecting with CBT/DBT groups.  Based on patient's symptoms and her past history of borderline personality disorder it does appear she is having reoccurrence of the symptoms and would benefit from restarting on medications and reconnecting with therapy/groups.  Colleen Valentine presents for follow-up evaluation. Today, 11/27/23, patient reports that mood lability has been stable and there has been improvement in her anxiety.  This correlates with when she discontinued her marijuana use.  Continuation patient feels that her attention and also that he had gotten a bit worse.  As said she does have neuropsych testing on July 7.  That being the case we will continue on patient's regimen and follow up in 2 months.  She will send in neuropsych testing when she completes it.  We will discuss medication options at that time.  Plan: - Continue Trileptal  450 mg BID - Continue hydroxyzine  25 mg TID prn for anxiety - Continue trazodone  100 QHS prn for insomnia - Continue therapy with  [name].  - [name] information provided - Neuropsych testing referral for ADHD, appt on 7/7 - CMP, CBC, iron  panel, Vit B12, TSH, A1c reviewed - EKG from 02/12/22 reviewed QTC 398 - Crisis resources reviewed - Follow up in 2 months  Chief Complaint:  Chief Complaint  Patient presents with   Follow-up   HPI: On presentation today Colleen Valentine reports that she is doing fairly well. She is not smoking marijuana anymore.  Since quitting she has had significant cravings for the marijuana.  Patient notes that she has been able to manage this though and stopped counting the days and she quit which was helpful.  Since she stopped smoking she has found that her anxiety and paranoia has improved. The ADHD symptoms seemed to have gotten a bit worse though since she stopped smoking. Colleen Valentine describes the worsening as a decrease in her attention span and increase in impulsivity.   Patient does have neuropsych testing scheduled for July 7 plans to bring the results of this provider upon completion.  Outside of this patient reports that moods been fairly stable.  Difficulty with sleep just due to scheduling issues.  By the time she gets her kids to bed and is ready for bed herself to leave her to take the trazodone  about feeling groggy the next day.  Figured out.  Part of the issue was her son has been having ongoing behavioral issues recently.  Having difficulty at daycare, aggression harm.  Colleen Valentine thinks these might be attention-seeking in nature.  Regardless he is connected with a psychiatric provider and was recently started on a medicine that seems to have been beneficial.  Colleen Valentine is  hopeful that as he improves the time management in the evenings will also improve.  She continues take Trileptal  regularly denying any adverse side effects.  Past Psychiatric History: She has a past psychiatric history of oppositional defiant disorder, ADHD, anxiety, depression, borderline personality disorder, PTSD,  Schizoaffective disorder, and marijuana use disorder.  At 16 she was in psychiatric residential facility following a suicide attempt after her mom passed. Learned CBT and DBT there that she carried with her and had helped since.  Most notably the cognitive re framing.  She has seen a therapist intermittently in the past  Medications- Pristiq , Zoloft , Paxil, Prozac, Lexapro , Wellbutrin , Cymbalta  (palpitations), BuSpar , trintellix  (nausea), trazodone , lamictal , seroquel, hydroxyzine , ativan , and gabapentin .  She endorsed daily marijuana at initial evaluation.  She reported it helped with anxiety symptoms.  She does note that it negatively impacts her concentration and increases her appetite.  Later on patient began to notice the adverse side effects of the medication and became sober.  She had a relapse in November 2024 for 2 weeks following the passing of her grandfather.  Since then she has been sober from all substances.  Had used alcohol in the past averaging a couple drinks a week. Cocaine use as a teenager   Past Medical History:  Past Medical History:  Diagnosis Date   Acute pyelonephritis 05/17/2018   Anemia    Anxiety    Complication of anesthesia    Depression    GERD (gastroesophageal reflux disease)    Herpes genitalia    Insomnia    Obesity    Polysubstance abuse (HCC) 04/01/2011   PONV (postoperative nausea and vomiting)    Seizures (HCC)    once in 2016 due to alcohol poisioning   Suicide [facility])     Past Surgical History:  Procedure Laterality Date   CESAREAN SECTION N/A 08/30/2020   Procedure: CESAREAN SECTION;  Surgeon: Kandis Devaughn Sayres, MD;  Location: [facility] LD ORS;  Service: Obstetrics;  Laterality: N/A;   CHOLECYSTECTOMY  2015   COSMETIC SURGERY  Age 25   dog bite to face   IR FLUORO GUIDED NEEDLE PLC ASPIRATION/INJECTION LOC  08/22/2020   IR US  GUIDE BX ASP/DRAIN  08/22/2020   LAMINECTOMY N/A 08/22/2020   Procedure: THORACIC TWELVE TO LUMBAR ONELAMINECTOMY FOR DRAINAGE OF  EPIDURAL ABSCESS;  Surgeon: Colon Shove, MD;  Location: [facility] OR;  Service: Neurosurgery;  Laterality: N/A;   Family History:  Family History  Problem Relation Age of Onset   Alcohol abuse Mother    Stroke Mother    Bipolar disorder Mother    Drug abuse Mother    Alcohol abuse Father    Bipolar disorder Father    Drug abuse Father    Diabetes Father    Heart failure Father    Cancer Maternal Aunt        stomach cancer   Diabetes Maternal Aunt    Crohn's disease Paternal Aunt    Crohn's disease Paternal Aunt    Crohn's disease Paternal Uncle    Colon cancer Paternal Uncle    Breast cancer Maternal Grandmother    Colon cancer Maternal Grandfather    Diabetes type II Paternal Grandfather    Heart failure Paternal Grandfather    Celiac disease Neg Hx    Cervical cancer Neg Hx    Lung cancer Neg Hx     Social History:  Social History   Socioeconomic History   Marital status: Single    Spouse name: Not on file  Number of children: 2   Years of education: Not on file   Highest education level: GED or equivalent  Occupational History   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    Comment:    Tobacco Use   Smoking status: Former    Current packs/day: 0.00    Average packs/day: 0.5 packs/day for 5.0 years (2.5 ttl pk-yrs)    Types: Cigarettes    Start date: 11/24/2014    Quit date: 11/24/2019    Years since quitting: 4.0   Smokeless tobacco: Never   Tobacco comments:    one pack cigarettes daily  Vaping Use   Vaping status: Every Day   Start date: 08/26/2019  Substance and Sexual Activity   Alcohol use: Yes    Comment: social   Drug use: Yes    Types: Marijuana    Comment: every other day   Sexual activity: Yes    Partners: Male    Birth control/protection: Other-see comments    Comment: female partner  Other Topics Concern   Not on file  Social History Narrative   Lives with her dad and her two children, will be going to [employer] in the  spring. Works ar [employer].    Social Drivers of Corporate investment banker Strain: Low Risk  (06/11/2023)   Received from [facility]   Overall Financial Resource Strain (CARDIA)    Difficulty of Paying Living Expenses: Not hard at all  Food Insecurity: No Food Insecurity (11/12/2023)   Received from [facility]   Hunger Vital Sign    Within the past 12 months, you worried that your food would run out before you got the money to buy more.: Never true    Within the past 12 months, the food you bought just didn't last and you didn't have money to get more.: Never true  Transportation Needs: No Transportation Needs (11/12/2023)   Received from [facility]   PRAPARE - Transportation    Lack of Transportation (Medical): No    Lack of Transportation (Non-Medical): No  Physical Activity: Sufficiently Active (11/12/2023)   Received from [facility]   Exercise Vital Sign    On average, how many days per week do you engage in moderate to strenuous exercise (like a brisk walk)?: 7 days    On average, how many minutes do you engage in exercise at this level?: 90 min  Stress: Stress Concern Present (06/11/2023)   Received from [facility] - Occupational Stress Questionnaire    Feeling of Stress : Very much  Social Connections: Somewhat Isolated (06/11/2023)   Received from [facility]   Social Network    How would you rate your social network (family, work, friends)?: Restricted participation with some degree of social isolation    Allergies: No Known Allergies  Current Medications: Current Outpatient Medications  Medication Sig Dispense Refill   hydrOXYzine  (ATARAX ) 25 MG tablet Take 1 tablet (25 mg total) by mouth 3 (three) times daily as needed for anxiety. 90 tablet 2   ibuprofen  (ADVIL ) 800 MG tablet Take 800 mg by mouth every 8 (eight) hours as needed.     ondansetron  (ZOFRAN -ODT) 4 MG disintegrating tablet Take 1  tablet (4 mg total) by mouth every 8 (eight) hours as needed for nausea or vomiting. 30 tablet 1   Oxcarbazepine  (TRILEPTAL ) 300 MG tablet Take 1.5 tablets (450 mg total) by mouth 2 (two) times daily. 270 tablet  0   pantoprazole  (PROTONIX ) 40 MG tablet Take 1 tablet (40 mg total) by mouth daily. (Patient not taking: Reported on 10/23/2022) 30 tablet 3   traZODone  (DESYREL ) 100 MG tablet Take 1 tablet (100 mg total) by mouth at bedtime as needed for sleep (insomnia). 30 tablet 1   valACYclovir  (VALTREX ) 500 MG tablet Take 500 mg by mouth 2 (two) times daily.     No current facility-administered medications for this visit.     Psychiatric Specialty Exam: Review of Systems  There were no vitals taken for this visit.There is no height or weight on file to calculate BMI.  General Appearance: Fairly Groomed  Eye Contact:  Good  Speech:  Clear and Coherent and Normal Rate  Volume:  Normal  Mood:  Euthymic  Affect:  Congruent  Thought Process:  Coherent, Goal Directed, and Linear  Orientation:  Full (Time, Place, and Person)  Thought Content: Logical   Suicidal Thoughts:  No  Homicidal Thoughts:  No  Memory:  NA  Judgement:  Good  Insight:  Fair  Psychomotor Activity:  Normal  Concentration:  Concentration: Fair  Recall:  Good  Fund of Knowledge: Good  Language: Good  Akathisia:  NA    AIMS (if indicated): not done  Assets:  Communication Skills Desire for Improvement Housing Talents/Skills Transportation Vocational/Educational  ADL's:  Intact  Cognition: WNL  Sleep:  Good   Metabolic Disorder Labs: Lab Results  Component Value Date   HGBA1C 5.1 02/12/2022   MPG 97 07/21/2012   MPG 94 04/01/2011   No results found for: PROLACTIN Lab Results  Component Value Date   CHOL 164 07/09/2019   TRIG 199 (H) 07/09/2019   HDL 38 (L) 07/09/2019   CHOLHDL 4.3 07/09/2019   VLDL 12 07/21/2012   LDLCALC 92 07/09/2019   LDLCALC 68 12/16/2015   Lab Results  Component Value Date    TSH 1.700 02/12/2022   TSH 1.030 07/09/2019    Therapeutic Level Labs: No results found for: LITHIUM No results found for: VALPROATE No results found for: CBMZ   Screenings: AUDIT    Flowsheet Row ED to Hosp-Ad[facility] (Dis[facility]) [facility]  Alcohol Use Disorder Identification Test Final Score (AUDIT) 0   GAD-7    Flowsheet Row Counselor from 07/30/2023 in [facility] Visit from 10/23/2022 in [facility] Visit from [facility]03/2022 [facility] Visit from 02/12/2022 in [facility] Visit from 01/03/2022 in [facility]  Total GAD-7 Score 17 20 19 21 21    PHQ2-9    Flowsheet Row Counselor from 07/30/2023 in [facility] Visit from 10/23/2022 in [facility] Visit from 06/03/2022 [facility] Visit from 02/12/2022 in [facility] Visit from 01/03/2022 in [facility]  PHQ-2 Total Score 1 3 3 2 5   PHQ-9 Total Score -- 15 13 11 24    Flowsheet Row UC from 07/24/2022 in [facility] Visit from [facility]22 [facility] from 01/17/2022 in [facility] at [facility]  C-SSRS RISK CATEGORY No Risk No Risk No Risk    Collaboration of Care: Collaboration of Care: Medication Management AEB medication prescription and Referral  or follow-up with counselor/therapist AEB chart review  Patient/Guardian was advised Release of Information must be obtained prior to any record release in order to collaborate their care with an outside  provider. Patient/Guardian was advised if they have not already done so to contact the registration department to sign all necessary forms in order for us  to release information regarding their care.   Consent: Patient/Guardian gives verbal consent for treatment and assignment of benefits for services provided during this visit. Patient/Guardian expressed understanding and agreed to proceed.    Virtual Visit via Video Note  I connected with Colleen Valentine on 08/28/23 at 4:00 PM EDT by a video enabled telemedicine application and verified that I am speaking with the correct person using two identifiers.  Location: Patient: Home Provider: Home Office   I discussed the limitations of evaluation and management by telemedicine and the availability of in person appointments. The patient expressed understanding and agreed to proceed.   I discussed the assessment and treatment plan with the patient. The patient was provided an opportunity to ask questions and all were answered. The patient agreed with the plan and demonstrated an understanding of the instructions.   The patient was advised to call back or seek an in-person evaluation if the symptoms worsen or if the condition fails to improve as anticipated.  I provided 10 minutes of non-face-to-face time during this encounter  Arvella CHRISTELLA Finder, MD 11/27/2023, 3:59 PM

## 2023-11-27 ENCOUNTER — Encounter (HOSPITAL_COMMUNITY): Payer: Self-pay | Admitting: Psychiatry

## 2023-11-27 ENCOUNTER — Telehealth (HOSPITAL_COMMUNITY): Payer: MEDICAID | Admitting: Psychiatry

## 2023-11-27 DIAGNOSIS — F603 Borderline personality disorder: Secondary | ICD-10-CM | POA: Diagnosis not present

## 2023-11-27 MED ORDER — OXCARBAZEPINE 300 MG PO TABS: 450.0000 mg | ORAL_TABLET | Freq: Two times a day (BID) | ORAL | 0 refills | Status: DC

## 2023-12-03 ENCOUNTER — Encounter (HOSPITAL_COMMUNITY): Payer: Self-pay | Admitting: Clinical

## 2023-12-03 ENCOUNTER — Ambulatory Visit (INDEPENDENT_AMBULATORY_CARE_PROVIDER_SITE_OTHER): Payer: MEDICAID | Admitting: Clinical

## 2023-12-03 DIAGNOSIS — F603 Borderline personality disorder: Secondary | ICD-10-CM

## 2023-12-03 DIAGNOSIS — F1211 Cannabis abuse, in remission: Secondary | ICD-10-CM

## 2023-12-03 DIAGNOSIS — F909 Attention-deficit hyperactivity disorder, unspecified type: Secondary | ICD-10-CM | POA: Diagnosis not present

## 2023-12-03 DIAGNOSIS — F330 Major depressive disorder, recurrent, mild: Secondary | ICD-10-CM

## 2023-12-03 DIAGNOSIS — Z8659 Personal history of other mental and behavioral disorders: Secondary | ICD-10-CM

## 2023-12-03 NOTE — Progress Notes (Signed)
 THERAPIST PROGRESS NOTE  Session Time: 3:09pm-3:56pm  Session #17  Virtual Visit via Video Note  I connected with Colleen Valentine on 12/03/23 at  3:00 PM EDT by a video enabled telemedicine application and verified that I am speaking with the correct person using two identifiers.  Location: Patient: car in parking lot Provider:[place] outpatient therapy office - [place]    I discussed the limitations of evaluation and management by telemedicine and the availability of in person appointments. The patient expressed understanding and agreed to proceed.  I discussed the assessment and treatment plan with the patient. The patient was provided an opportunity to ask questions and all were answered. The patient agreed with the plan and demonstrated an understanding of the instructions.   The patient was advised to call back or seek an in-person evaluation if the symptoms worsen or if the condition fails to improve as anticipated.  I provided 47 minutes of non-face-to-face time during this encounter.  Virtual connection became very poor and garbled, so was finished via phone.  Colleen JINNY Crest, LCSW   Participation Level: Active  Behavioral Response: Casual Alert Euthymic and hopeful  Type of Therapy: Individual Therapy  Treatment Goals addressed:     Score less than 9 on the PHQ-9 and less than 5 on the GAD-7 as evidenced by intermittent administration of the questionnaires to determine progress in managing depression and anxiety.  LTG: Increase coping skills to manage depression and improve ability to perform daily activities LTG: Will be able to identify 5-7 cognitive distortions she has and will learn how to come up with replacement thoughts that are more balanced, realistic, and helpful. STG: Learn coping skills and increase resilience through application of CBT techniques and through processing of life in a shame framework LTG: Recall traumatic events without becoming overwhelmed  with negative emotions STG: Colleen Valentine will practice emotion regulation skills 5 time(s) per week for the next 26 week(s) LTG: Learn breathing techniques and grounding techniques and demonstrate mastery in session then report independent use of these skills out of session. LTG: Verbalize the situations that can easily trigger feelings of fear, depression, and anger LTG: Learn distress tolerance skills and practice using them STG: Colleen Valentine will develop a Plan of Action regarding her desire to stop smoking marijuana LTG: Colleen Valentine will continue with her journey of sobriety by remaining abstinent from marijuana by using all coping skills at her disposal STG: Colleen Valentine will learn about the harm of substances, will identify her triggers, and will create and implement a relapse prevention plan  ProgressTowards Goals: Progressing  Interventions: Supportive, Family Systems, and Other: substance abuse counseling for [facility]   Summary: Colleen Valentine is a 28 y.o. female who presents with Borderline Personality Disorder, depression, PTSD, and cannabis use disorder.  She presented oriented x5 and stated she was feeling in control of myself.  CSW evaluated patient's medication compliance, use of coping tools, and self-care, as applicable.  She provided an update on various aspects of her life that are normally discussed in therapy, including results of court, how her son is doing, taking summer off, upcoming vacation, and bargaining with grandmother.  She was very proud to report that right after our last session, she completely stopped using THC and has not returned to it.  She started around ages 10-14yo, so states this is the first time in her life she feels she has faced her anxiety and depression without using marijuana.  She remarked, I have not seen this version of myself for a  long time.  She was provided positive strokes for this.  She reported that her motion for the 50/50 custody of her daughter to be switched to her  100% was dismissed because she did not have enough evidence.  Attorney and court personnel have told her the information she gathered was helpful and to continue gathering such information until there is more and try again.  It will also possibly bolster her case when he goes to court in August for assault on a Emergency planning/management officer.  She tried to open up a child support case, but got conflicting information so is at the point of just letting it go and relying on her faith to provide.  Her son's behavior is much improved after they found out he was put on an adult dose of the medicine, switched him to the child dose.  She took the summer off from school and is finding this refreshing.  Her sister and she will be going to the beach for 2-3 days this weekend without kids, and she looks forward to that.  She is intimidated by the idea of filling out her FAFSA for next school year, but is determined to follow through.  Finally we explored past incidents with her grandmother who has denied her access to her mother's ashes since 2011.  They have bargained with each other so that both will be satisfied and she will be able now to get some of her mother's ashes.  The knowledge that this has brought to her and a recent incident with grandmother has made her withdraw from her grandmother, however.  We discussed the Serenity Prayer, with CSW showing how asking for the serenity to accept the things we cannot change means asking to accept everything, while asking for the courage to change the things we can means asking for courage to change ourselves because that is the only thing we can change.  This impacted her greatly.  Suicidal/Homicidal: No  Therapist Response: Patient is progressing AEB engaging in scheduled therapy session.  Throughout the session, CSW gave patient the opportunity to explore thoughts and feelings associated with current life situations and past/present stressors.   CSW challenged patient gently and  appropriately to consider different ways of looking at reported issues. CSW encouraged patient's expression of feelings and validated these using empathy, active listening, open body language, and unconditional positive regard.        Plan/Recommendations:  Return to therapy at next scheduled appointment on 7/23, keep using  skills taught in sessions, think about version of serenity prayer explained God grant me the serenity to accept everything and the courage to change myself  Diagnosis: Cannabis use disorder, mild, in early remission  Mild episode of recurrent major depressive disorder (HCC)  Borderline personality disorder (HCC)  Adult ADHD (attention deficit hyperactivity disorder)  History of posttraumatic stress disorder (PTSD)  Collaboration of Care: Psychiatrist AEB - doctor and therapist can see notes in Epic  Patient/Guardian was advised Release of Information must be obtained prior to any record release in order to collaborate their care with an outside provider. Patient/Guardian was advised if they have not already done so to contact the registration department to sign all necessary forms in order for us  to release information regarding their care.   Consent: Patient/Guardian gives verbal consent for treatment and assignment of benefits for services provided during this visit. Patient/Guardian expressed understanding and agreed to proceed.   Colleen JINNY Crest, LCSW 12/03/2023

## 2023-12-17 ENCOUNTER — Ambulatory Visit (HOSPITAL_COMMUNITY): Payer: MEDICAID | Admitting: Clinical

## 2023-12-29 ENCOUNTER — Encounter (HOSPITAL_COMMUNITY): Payer: Self-pay

## 2023-12-31 ENCOUNTER — Ambulatory Visit (HOSPITAL_COMMUNITY): Payer: MEDICAID | Admitting: Clinical

## 2023-12-31 ENCOUNTER — Encounter (HOSPITAL_COMMUNITY): Payer: Self-pay | Admitting: Clinical

## 2023-12-31 DIAGNOSIS — F330 Major depressive disorder, recurrent, mild: Secondary | ICD-10-CM

## 2023-12-31 DIAGNOSIS — F1211 Cannabis abuse, in remission: Secondary | ICD-10-CM

## 2023-12-31 DIAGNOSIS — F909 Attention-deficit hyperactivity disorder, unspecified type: Secondary | ICD-10-CM | POA: Diagnosis not present

## 2023-12-31 DIAGNOSIS — F603 Borderline personality disorder: Secondary | ICD-10-CM

## 2023-12-31 DIAGNOSIS — Z8659 Personal history of other mental and behavioral disorders: Secondary | ICD-10-CM
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
## 2024-01-14 ENCOUNTER — Encounter (HOSPITAL_COMMUNITY): Payer: Self-pay | Admitting: Clinical

## 2024-01-14 ENCOUNTER — Ambulatory Visit (HOSPITAL_COMMUNITY): Payer: MEDICAID | Admitting: Clinical

## 2024-01-14 DIAGNOSIS — F1211 Cannabis abuse, in remission: Secondary | ICD-10-CM | POA: Diagnosis not present

## 2024-01-14 DIAGNOSIS — F603 Borderline personality disorder: Secondary | ICD-10-CM | POA: Diagnosis not present

## 2024-01-14 DIAGNOSIS — F330 Major depressive disorder, recurrent, mild: Secondary | ICD-10-CM | POA: Diagnosis not present

## 2024-01-14 DIAGNOSIS — Z8659 Personal history of other mental and behavioral disorders: Secondary | ICD-10-CM

## 2024-01-14 DIAGNOSIS — F909 Attention-deficit hyperactivity disorder, unspecified type: Secondary | ICD-10-CM | POA: Diagnosis not present

## 2024-01-14 NOTE — Progress Notes (Unsigned)
 THERAPIST PROGRESS NOTE  Session Time: 4:02pm-5:02pm ***  Session #19  Virtual Visit via Video Note  I connected with Colleen Valentine on 01/14/24 at  4:00 PM EDT by a video enabled telemedicine application and verified that I am speaking with the correct person using two identifiers.  Location: Patient: car in parking lot Provider:[place] [facility] therapy office - [place]    I discussed the limitations of evaluation and management by telemedicine and the availability of in person appointments. The patient expressed understanding and agreed to proceed.  I discussed the assessment and treatment plan with the patient. The patient was provided an opportunity to ask questions and all were answered. The patient agreed with the plan and demonstrated an understanding of the instructions.   The patient was advised to call back or seek an in-person evaluation if the symptoms worsen or if the condition fails to improve as anticipated.  I provided *** minutes of non-face-to-face time during this encounter.    Colleen JINNY Crest, LCSW   Participation Level: Active  Behavioral Response: Casual Alert Euthymic and Irritable   Type of Therapy: Individual Therapy  Treatment Goals addressed:    *** Score less than 9 on the PHQ-9 and less than 5 on the GAD-7 as evidenced by intermittent administration of the questionnaires to determine progress in managing depression and anxiety.  LTG: Increase coping skills to manage depression and improve ability to perform daily activities LTG: Will be able to identify 5-7 cognitive distortions she has and will learn how to come up with replacement thoughts that are more balanced, realistic, and helpful. STG: Learn coping skills and increase resilience through application of CBT techniques and through processing of life in a shame framework LTG: Recall traumatic events without becoming overwhelmed with negative emotions STG: Colleen Valentine will practice emotion  regulation skills 5 time(s) per week for the next 26 week(s) LTG: Learn breathing techniques and grounding techniques and demonstrate mastery in session then report independent use of these skills out of session. LTG: Verbalize the situations that can easily trigger feelings of fear, depression, and anger LTG: Learn distress tolerance skills and practice using them STG: Colleen Valentine will develop a Plan of Action regarding her desire to stop smoking marijuana LTG: Colleen Valentine will continue with her journey of sobriety by remaining abstinent from marijuana by using all coping skills at her disposal STG: Colleen Valentine will learn about the harm of substances, will identify her triggers, and will create and implement a relapse prevention plan  ProgressTowards Goals: Progressing  Interventions: Supportive and Anger Management Training  Summary: Colleen Valentine is a 28 y.o. female who presents with Borderline Personality Disorder, depression, PTSD, and cannabis use disorder.  She presented oriented x5 and stated she was feeling not so good today.  CSW evaluated patient's medication compliance, use of coping tools, and self-care, as applicable.  She provided an update on various aspects of her life that are normally discussed in therapy, including ***  Suicidal/Homicidal: No  Therapist Response: Patient is progressing AEB engaging in scheduled therapy session.  Throughout the session, CSW gave patient the opportunity to explore thoughts and feelings associated with current life situations and past/present stressors.   CSW challenged patient gently and appropriately to consider different ways of looking at reported issues. CSW encouraged patient's expression of feelings and validated these using empathy, active listening, open body language, and unconditional positive regard.        Plan/Recommendations:  Return to therapy at next scheduled appointment on 9/23, ***  Diagnosis: Mild  episode of recurrent major depressive  disorder (HCC)  Borderline personality disorder (HCC)  Cannabis use disorder, mild, in early remission  Adult ADHD (attention deficit hyperactivity disorder)  History of posttraumatic stress disorder (PTSD)  Collaboration of Care: Psychiatrist AEB - doctor and therapist can see notes in Epic  Patient/Guardian was advised Release of Information must be obtained prior to any record release in order to collaborate their care with an outside provider. Patient/Guardian was advised if they have not already done so to contact the registration department to sign all necessary forms in order for us  to release information regarding their care.   Consent: Patient/Guardian gives verbal consent for treatment and assignment of benefits for services provided during this visit. Patient/Guardian expressed understanding and agreed to proceed.   Colleen JINNY Crest, LCSW 01/14/2024

## 2024-01-27 NOTE — Progress Notes (Unsigned)
 [facility] MD/PA/NP OP Progress Note  01/29/2024 4:00 PM Colleen Valentine  MRN:  [id number]  Visit Diagnosis:    ICD-10-CM   1. Attention deficit hyperactivity disorder (ADHD), predominantly inattentive type  F90.0 methylphenidate  (CONCERTA ) 36 MG PO CR tablet    methylphenidate  (RITALIN ) 5 MG tablet    methylphenidate  (RITALIN ) 5 MG tablet    methylphenidate  (CONCERTA ) 36 MG PO CR tablet    2. Borderline personality disorder (HCC)  F60.3 Oxcarbazepine  (TRILEPTAL ) 300 MG tablet    3. Encounter for long-term (current) use of medications  Z79.899 ToxASSURE Select 13 (MW), Urine       Assessment: Colleen Valentine is a 28 y.o. female with a history of oppositional defiant disorder, ADHD, anxiety, depression, borderline personality disorder, PTSD, and marijuana use disorder. who presented to [facility] for initial evaluation on 06/03/2022.    During initial evaluation patient reported symptoms of mood lability including depressed mood, irritability, anxiety, and elevated mood.  She also endorsed symptoms of impulsive behaviors, chronic feelings of emptiness, emotional instability, real or perceived fear of abandonment, dissociative symptoms, and a past history of trauma.  Of note patient did endorse a history of nonsuicidal self-injury and poor sense of self that had improved after connecting with CBT/DBT groups.  Based on patient's symptoms and her past history of borderline personality disorder it does appear she is having reoccurrence of the symptoms and would benefit from restarting on medications and reconnecting with therapy/groups.  Colleen Valentine presents for follow-up evaluation. Today, 01/29/24, patient reports that moods have been stable in the interim despite increase psychosocial stressors.  She completed neuropsych testing which confirmed ADHD diagnosis and sent over results.  Patient was started on Concerta  and Ritalin  with benefit in symptoms.  There had been some  concern about medication wearing off too early in the day.  Reviewed the expected length of time medications that affect as well as concerned about taking stimulants later in the afternoon.  We will titrate the Concerta  to 36 mg daily Ritalin  5 mg.  We will follow-up in 2 months.  Patient will have a UTOX in December.  Plan: - Continue Trileptal  450 mg BID - Continue hydroxyzine  25 mg TID prn for anxiety - Continue trazodone  100 QHS prn for insomnia - Start concerta  36 mg daily - Start Ritalin  5 mg daily  - Utox ordered - Continue therapy with [name].  - [name] information provided - Neuropsych testing completed and results reviewed positive for ADHD - CMP, CBC, iron  panel, Vit B12, TSH, A1c reviewed - EKG from 02/12/22 reviewed QTC 398 - Crisis resources reviewed - Follow up in 2 months  Chief Complaint:  Chief Complaint  Patient presents with   Follow-up   HPI: On presentation today Colleen Valentine reports that the past couple months have gone well. She is adjusting to going back to school after having the summer off Her son has been displaying concerning behaviors (hearing voices and being told to do things, has had aggression towards other kids) which has been stressful. He has a psychological assessment tomorrow.  She is still having trouble with the Concerta  not lasting long enough. Takes the 27 mg tab in the morning and taking an afternoon dose between 2:30 and 3. The attention and overstimulation improves when she is on the medication and continues until around 7 pm.  Education was provided around the length of time the medication should have effect.  She initially denied any adverse side effects though later reported  some flushing after taking the medication in addition to binging when the medication wears off.  Reviewed portance of checking blood pressure to confirm that there is no increase since starting on medication.  Regards to the binging explained how this likely is  secondary to appetite suppression during the day and is important to set alarms and reminders to eat so we are not binging when the medication wears off.  Past Psychiatric History: She has a past psychiatric history of oppositional defiant disorder, ADHD, anxiety, depression, borderline personality disorder, PTSD, Schizoaffective disorder, and marijuana use disorder.  At 16 she was in psychiatric residential facility following a suicide attempt after her mom passed. Learned CBT and DBT there that she carried with her and had helped since.  Most notably the cognitive re framing.  She has seen a therapist intermittently in the past  Medications- Pristiq , Zoloft , Paxil, Prozac, Lexapro , Wellbutrin , Cymbalta  (palpitations), BuSpar , trintellix  (nausea), trazodone , lamictal , seroquel, hydroxyzine , ativan , and gabapentin .  She endorsed daily marijuana at initial evaluation.  She reported it helped with anxiety symptoms.  She does note that it negatively impacts her concentration and increases her appetite.  Later on patient began to notice the adverse side effects of the medication and became sober.  She had a relapse in November 2024 for 2 weeks following the passing of her grandfather.  Since then she has been sober from all substances.  Had used alcohol in the past averaging a couple drinks a week. Cocaine use as a teenager   Past Medical History:  Past Medical History:  Diagnosis Date   Acute pyelonephritis 05/17/2018   Anemia    Anxiety    Complication of anesthesia    Depression    GERD (gastroesophageal reflux disease)    Herpes genitalia    Insomnia    Obesity    Polysubstance abuse (HCC) 04/01/2011   PONV (postoperative nausea and vomiting)    Seizures (HCC)    once in 2016 due to alcohol poisioning   Suicide [facility])     Past Surgical History:  Procedure Laterality Date   CESAREAN SECTION N/A 08/30/2020   Procedure: CESAREAN SECTION;  Surgeon: Kandis Devaughn Sayres, MD;  Location: [facility] LD ORS;   Service: Obstetrics;  Laterality: N/A;   CHOLECYSTECTOMY  2015   COSMETIC SURGERY  Age 80   dog bite to face   IR FLUORO GUIDED NEEDLE PLC ASPIRATION/INJECTION LOC  08/22/2020   IR US  GUIDE BX ASP/DRAIN  08/22/2020   LAMINECTOMY N/A 08/22/2020   Procedure: THORACIC TWELVE TO LUMBAR ONELAMINECTOMY FOR DRAINAGE OF EPIDURAL ABSCESS;  Surgeon: Colon Shove, MD;  Location: [facility] OR;  Service: Neurosurgery;  Laterality: N/A;   Family History:  Family History  Problem Relation Age of Onset   Alcohol abuse Mother    Stroke Mother    Bipolar disorder Mother    Drug abuse Mother    Alcohol abuse Father    Bipolar disorder Father    Drug abuse Father    Diabetes Father    Heart failure Father    Cancer Maternal Aunt        stomach cancer   Diabetes Maternal Aunt    Crohn's disease Paternal Aunt    Crohn's disease Paternal Aunt    Crohn's disease Paternal Uncle    Colon cancer Paternal Uncle    Breast cancer Maternal Grandmother    Colon cancer Maternal Grandfather    Diabetes type II Paternal Grandfather    Heart failure Paternal Grandfather  Celiac disease Neg Hx    Cervical cancer Neg Hx    Lung cancer Neg Hx     Social History:  Social History   Socioeconomic History   Marital status: Single    Spouse name: Not on file   Number of children: 2   Years of education: Not on file   Highest education level: GED or equivalent  Occupational History   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    Comment:    Tobacco Use   Smoking status: Former    Current packs/day: 0.00    Average packs/day: 0.5 packs/day for 5.0 years (2.5 ttl pk-yrs)    Types: Cigarettes    Start date: 11/24/2014    Quit date: 11/24/2019    Years since quitting: 4.1   Smokeless tobacco: Never   Tobacco comments:    one pack cigarettes daily  Vaping Use   Vaping status: Every Day   Start date: 08/26/2019  Substance and Sexual Activity   Alcohol use: Yes    Comment: social   Drug use: Yes     Types: Marijuana    Comment: every other day   Sexual activity: Yes    Partners: Male    Birth control/protection: Other-see comments    Comment: female partner  Other Topics Concern   Not on file  Social History Narrative   Lives with her dad and her two children, will be going to [employer] in the spring. Works ar [employer].    Social Drivers of Corporate investment banker Strain: Low Risk  (06/11/2023)   Received from [facility]   Overall Financial Resource Strain (CARDIA)    Difficulty of Paying Living Expenses: Not hard at all  Food Insecurity: No Food Insecurity (11/12/2023)   Received [facility]   Hunger Vital Sign    Within the past 12 months, you worried that your food would run out before you got the money to buy more.: Never true    Within the past 12 months, the food you bought just didn't last and you didn't have money to get more.: Never true  Transportation Needs: No Transportation Needs (11/12/2023)   Received from [facility]   PRAPARE - Transportation    Lack of Transportation (Medical): No    Lack of Transportation (Non-Medical): No  Physical Activity: Sufficiently Active (11/12/2023)   [facility]   Exercise Vital Sign    On average, how many days per week do you engage in moderate to strenuous exercise (like a brisk walk)?: 7 days    On average, how many minutes do you engage in exercise at this level?: 90 min  Stress: Stress Concern Present (06/11/2023)   Received from [facility] - Occupational Stress Questionnaire    Feeling of Stress : Very much  Social Connections: Somewhat Isolated (06/11/2023)   Received from [facility]   Social Network    How would you rate your social network (family, work, friends)?: Restricted participation with some degree of social isolation    Allergies: No Known Allergies  Current Medications: Current Outpatient Medications   Medication Sig Dispense Refill   methylphenidate  (CONCERTA ) 36 MG PO CR tablet Take 1 tablet (36 mg total) by mouth every morning. 30 tablet 0   [START ON 02/27/2024] methylphenidate  (CONCERTA ) 36 MG PO CR tablet Take 1 tablet (36 mg total) by mouth every morning. 30 tablet 0   methylphenidate  (  RITALIN ) 5 MG tablet Take 1 tablet (5 mg total) by mouth daily. 30 tablet 0   [START ON 02/27/2024] methylphenidate  (RITALIN ) 5 MG tablet Take 1 tablet (5 mg total) by mouth daily. 30 tablet 0   hydrOXYzine  (ATARAX ) 25 MG tablet Take 1 tablet (25 mg total) by mouth 3 (three) times daily as needed for anxiety. 90 tablet 2   ibuprofen  (ADVIL ) 800 MG tablet Take 800 mg by mouth every 8 (eight) hours as needed.     ondansetron  (ZOFRAN -ODT) 4 MG disintegrating tablet Take 1 tablet (4 mg total) by mouth every 8 (eight) hours as needed for nausea or vomiting. 30 tablet 1   Oxcarbazepine  (TRILEPTAL ) 300 MG tablet Take 1.5 tablets (450 mg total) by mouth 2 (two) times daily. 270 tablet 0   pantoprazole  (PROTONIX ) 40 MG tablet Take 1 tablet (40 mg total) by mouth daily. (Patient not taking: Reported on 10/23/2022) 30 tablet 3   traZODone  (DESYREL ) 100 MG tablet Take 1 tablet (100 mg total) by mouth at bedtime as needed for sleep (insomnia). 30 tablet 1   valACYclovir  (VALTREX ) 500 MG tablet Take 500 mg by mouth 2 (two) times daily.     No current facility-administered medications for this visit.     Psychiatric Specialty Exam: Review of Systems  There were no vitals taken for this visit.There is no height or weight on file to calculate BMI.  General Appearance: Fairly Groomed  Eye Contact:  Good  Speech:  Clear and Coherent and Normal Rate  Volume:  Normal  Mood:  Euthymic  Affect:  Congruent  Thought Process:  Coherent, Goal Directed, and Linear  Orientation:  Full (Time, Place, and Person)  Thought Content: Logical   Suicidal Thoughts:  No  Homicidal Thoughts:  No  Memory:  NA  Judgement:  Good   Insight:  Fair  Psychomotor Activity:  Normal  Concentration:  Concentration: Fair  Recall:  Good  Fund of Knowledge: Good  Language: Good  Akathisia:  NA    AIMS (if indicated): not done  Assets:  Communication Skills Desire for Improvement Housing Talents/Skills Transportation Vocational/Educational  ADL's:  Intact  Cognition: WNL  Sleep:  Good   Metabolic Disorder Labs: Lab Results  Component Value Date   HGBA1C 5.1 02/12/2022   MPG 97 07/21/2012   MPG 94 04/01/2011   No results found for: PROLACTIN Lab Results  Component Value Date   CHOL 164 07/09/2019   TRIG 199 (H) 07/09/2019   HDL 38 (L) 07/09/2019   CHOLHDL 4.3 07/09/2019   VLDL 12 07/21/2012   LDLCALC 92 07/09/2019   LDLCALC 68 12/16/2015   Lab Results  Component Value Date   TSH 1.700 02/12/2022   TSH 1.030 07/09/2019    Therapeutic Level Labs: No results found for: LITHIUM No results found for: VALPROATE No results found for: CBMZ   Screenings: AUDIT    Flowsheet Row ED to Hosp-Ad[facility] ([facility]  Alcohol Use Disorder Identification Test Final Score (AUDIT) 0   GAD-7    Flowsheet Row Counselor from 07/30/2023 in [facility] Visit from 10/23/2022 in [facility] Visit from 06/03/2022 in [facility] Visit from 02/12/2022 in [facility] Visit from 01/03/2022 in [facility]  Total GAD-7 Score 17 20 19 21 21    [employer]    Flowsheet Row  Counselor from 07/30/2023 in [facility] Visit from 10/23/2022 in [facility] Visit from 06[facility]03/2022 in [facility] Visit from 02/12/2022 in [facility] Visit from 01/03/2022 in [facility]  PHQ-2 Total Score 1 3 3 2 5   PHQ-9 Total Score -- 15 13 11 24    Flowsheet Row UC from 07/24/2022 in [facility] Visit [facility] from 01/17/2022 in [facility] at [facility]  C-SSRS RISK CATEGORY No Risk No Risk No Risk    Collaboration of Care: Collaboration of Care: Medication Management AEB medication prescription and Referral or follow-up with counselor/therapist AEB chart review  Patient/Guardian was advised Release of Information must be obtained prior to any record release in order to collaborate their care with an outside provider. Patient/Guardian was advised if they have not already done so to contact the registration department to sign all necessary forms in order for us  to release information regarding their care.   Consent: Patient/Guardian gives verbal consent for treatment and assignment of benefits for services provided during this visit. Patient/Guardian expressed understanding and agreed to proceed.    Virtual Visit via Video Note  I connected with Colleen Valentine on 08/28/23 at 4:00 PM EDT by a video enabled telemedicine application and verified that I am speaking with the correct person using two identifiers.  Location: Patient: Home Provider: Home Office   I discussed the limitations of evaluation and management by telemedicine and the availability of in person appointments. The patient expressed understanding and agreed to proceed.   I discussed the assessment and treatment plan with the patient. The patient was provided an opportunity to ask questions and all were answered. The patient agreed with the plan and demonstrated an understanding of the instructions.   The patient was advised to call back or seek an in-person evaluation if the  symptoms worsen or if the condition fails to improve as anticipated.  I provided 10 minutes of non-face-to-face time during this encounter  Arvella CHRISTELLA Finder, MD 01/29/2024, 4:00 PM

## 2024-01-29 ENCOUNTER — Telehealth (HOSPITAL_BASED_OUTPATIENT_CLINIC_OR_DEPARTMENT_OTHER): Payer: MEDICAID | Admitting: Psychiatry

## 2024-01-29 ENCOUNTER — Encounter (HOSPITAL_COMMUNITY): Payer: Self-pay | Admitting: Psychiatry

## 2024-01-29 DIAGNOSIS — F603 Borderline personality disorder: Secondary | ICD-10-CM | POA: Diagnosis not present

## 2024-01-29 DIAGNOSIS — Z79899 Other long term (current) drug therapy: Secondary | ICD-10-CM

## 2024-01-29 DIAGNOSIS — F900 Attention-deficit hyperactivity disorder, predominantly inattentive type: Secondary | ICD-10-CM

## 2024-01-29 MED ORDER — METHYLPHENIDATE HCL 5 MG PO TABS: 5.0000 mg | ORAL_TABLET | Freq: Every day | ORAL | 0 refills | Status: DC

## 2024-01-29 MED ORDER — OXCARBAZEPINE 300 MG PO TABS
450.0000 mg | ORAL_TABLET | Freq: Two times a day (BID) | ORAL | 0 refills | Status: DC
Start: 2024-01-29 — End: 2024-03-25

## 2024-01-29 MED ORDER — METHYLPHENIDATE HCL ER (OSM) 36 MG PO TBCR: 36.0000 mg | EXTENDED_RELEASE_TABLET | ORAL | 0 refills | Status: DC

## 2024-01-29 MED ORDER — METHYLPHENIDATE HCL 5 MG PO TABS
5.0000 mg | ORAL_TABLET | Freq: Every day | ORAL | 0 refills | Status: DC
Start: 2024-02-27 — End: 2024-03-25

## 2024-02-04 ENCOUNTER — Telehealth (HOSPITAL_COMMUNITY): Payer: Self-pay | Admitting: Clinical
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
## 2024-02-04 NOTE — Telephone Encounter (Signed)
 Clinical Social Work Note  CSW returned phone call to patient,   Colleen Valentine, KEN 02/04/2024, 5:40 PM

## 2024-02-17 ENCOUNTER — Encounter (HOSPITAL_COMMUNITY): Payer: Self-pay | Admitting: Clinical

## 2024-02-17 ENCOUNTER — Ambulatory Visit (INDEPENDENT_AMBULATORY_CARE_PROVIDER_SITE_OTHER): Payer: MEDICAID | Admitting: Clinical

## 2024-02-17 DIAGNOSIS — F603 Borderline personality disorder: Secondary | ICD-10-CM

## 2024-02-17 DIAGNOSIS — F330 Major depressive disorder, recurrent, mild: Secondary | ICD-10-CM | POA: Diagnosis not present

## 2024-02-17 DIAGNOSIS — F900 Attention-deficit hyperactivity disorder, predominantly inattentive type: Secondary | ICD-10-CM | POA: Diagnosis not present

## 2024-02-17 DIAGNOSIS — F909 Attention-deficit hyperactivity disorder, unspecified type: Secondary | ICD-10-CM

## 2024-02-17 DIAGNOSIS — F1211 Cannabis abuse, in remission: Secondary | ICD-10-CM

## 2024-02-17 DIAGNOSIS — Z8659 Personal history of other mental and behavioral disorders: Secondary | ICD-10-CM

## 2024-02-17 NOTE — Progress Notes (Unsigned)
 THERAPIST PROGRESS NOTE  Session Time: 3:00pm-3:59pm   Session #20  Virtual Visit via Video Note  I connected with Colleen Valentine on 02/17/24 at  3:00 PM EDT by a video enabled telemedicine application and verified that I am speaking with the correct person using two identifiers.  Location: Patient: home Provider:  home office   I discussed the limitations of evaluation and management by telemedicine and the availability of in person appointments. The patient expressed understanding and agreed to proceed.  I discussed the assessment and treatment plan with the patient. The patient was provided an opportunity to ask questions and all were answered. The patient agreed with the plan and demonstrated an understanding of the instructions.   The patient was advised to call back or seek an in-person evaluation if the symptoms worsen or if the condition fails to improve as anticipated.  I provided 59 minutes of non-face-to-face time during this encounter.    Colleen JINNY Crest, LCSW   Participation Level: Active  Behavioral Response: Casual Alert Anxious and Depressed   Type of Therapy: Individual Therapy  Treatment Goals addressed:    *** Score less than 9 on the PHQ-9 and less than 5 on the GAD-7 as evidenced by intermittent administration of the questionnaires to determine progress in managing depression and anxiety.  LTG: Increase coping skills to manage depression and improve ability to perform daily activities LTG: Will be able to identify 5-7 cognitive distortions she has and will learn how to come up with replacement thoughts that are more balanced, realistic, and helpful. STG: Learn coping skills and increase resilience through application of CBT techniques and through processing of life in a shame framework LTG: Recall traumatic events without becoming overwhelmed with negative emotions STG: Colleen Valentine will practice emotion regulation skills 5 time(s) per week for the next 26  week(s) LTG: Learn breathing techniques and grounding techniques and demonstrate mastery in session then report independent use of these skills out of session. LTG: Verbalize the situations that can easily trigger feelings of fear, depression, and anger LTG: Learn distress tolerance skills and practice using them STG: Colleen Valentine will develop a Plan of Action regarding her desire to stop smoking marijuana LTG: Colleen Valentine will continue with her journey of sobriety by remaining abstinent from marijuana by using all coping skills at her disposal STG: Colleen Valentine will learn about the harm of substances, will identify her triggers, and will create and implement a relapse prevention plan  ProgressTowards Goals: Progressing  Interventions: Supportive, Anger Management Training, and Other: parenting issues ***  Summary: Colleen Valentine is a 28 y.o. female who presents with Borderline Personality Disorder, depression, PTSD, and cannabis use disorder.  She presented oriented x5 and stated she was feeling ***.  CSW evaluated patient's medication compliance, use of coping tools, and self-care, as applicable.  She provided an update on various aspects of her life that are normally discussed in therapy, including ***  Suicidal/Homicidal: No  Therapist Response: Patient is progressing AEB engaging in scheduled therapy session.  Throughout the session, CSW gave patient the opportunity to explore thoughts and feelings associated with current life situations and past/present stressors.   CSW challenged patient gently and appropriately to consider different ways of looking at reported issues. CSW encouraged patient's expression of feelings and validated these using empathy, active listening, open body language, and unconditional positive regard.        Plan/Recommendations:  Return to therapy in 2 weeks to next scheduled appointment on 10/7, reflect on what was discussed in  session, engage in self care behaviors as explored in  session, do homework as assigned (***), and return to next session prepared to talk about experience with new coping methods.   Diagnosis: Mild episode of recurrent major depressive disorder  Attention deficit hyperactivity disorder (ADHD), predominantly inattentive type  Borderline personality disorder (HCC)  Cannabis use disorder, mild, in early remission  Adult ADHD (attention deficit hyperactivity disorder)  History of posttraumatic stress disorder (PTSD)  Collaboration of Care: Psychiatrist AEB - doctor and therapist can see notes in Epic  Patient/Guardian was advised Release of Information must be obtained prior to any record release in order to collaborate their care with an outside provider. Patient/Guardian was advised if they have not already done so to contact the registration department to sign all necessary forms in order for us  to release information regarding their care.   Consent: Patient/Guardian gives verbal consent for treatment and assignment of benefits for services provided during this visit. Patient/Guardian expressed understanding and agreed to proceed.   Colleen JINNY Crest, LCSW 02/17/2024

## 2024-03-02 ENCOUNTER — Ambulatory Visit (HOSPITAL_COMMUNITY): Payer: MEDICAID | Admitting: Clinical

## 2024-03-02 ENCOUNTER — Encounter (HOSPITAL_COMMUNITY): Payer: Self-pay | Admitting: Clinical

## 2024-03-02 DIAGNOSIS — F900 Attention-deficit hyperactivity disorder, predominantly inattentive type: Secondary | ICD-10-CM

## 2024-03-02 DIAGNOSIS — F603 Borderline personality disorder: Secondary | ICD-10-CM | POA: Diagnosis not present

## 2024-03-02 DIAGNOSIS — F330 Major depressive disorder, recurrent, mild: Secondary | ICD-10-CM

## 2024-03-02 DIAGNOSIS — Z8659 Personal history of other mental and behavioral disorders: Secondary | ICD-10-CM | POA: Diagnosis not present

## 2024-03-02 DIAGNOSIS — F909 Attention-deficit hyperactivity disorder, unspecified type: Secondary | ICD-10-CM
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
## 2024-03-05 ENCOUNTER — Other Ambulatory Visit: Payer: Self-pay | Admitting: Medical Genetics

## 2024-03-05 DIAGNOSIS — Z006 Encounter for examination for normal comparison and control in clinical research program: Secondary | ICD-10-CM

## 2024-03-10 ENCOUNTER — Ambulatory Visit (HOSPITAL_COMMUNITY): Payer: MEDICAID | Admitting: Clinical

## 2024-03-10 ENCOUNTER — Encounter (HOSPITAL_COMMUNITY): Payer: Self-pay | Admitting: Clinical

## 2024-03-10 DIAGNOSIS — F909 Attention-deficit hyperactivity disorder, unspecified type: Secondary | ICD-10-CM

## 2024-03-10 DIAGNOSIS — F1211 Cannabis abuse, in remission: Secondary | ICD-10-CM

## 2024-03-10 DIAGNOSIS — Z8659 Personal history of other mental and behavioral disorders: Secondary | ICD-10-CM

## 2024-03-10 DIAGNOSIS — F603 Borderline personality disorder: Secondary | ICD-10-CM

## 2024-03-10 DIAGNOSIS — F330 Major depressive disorder, recurrent, mild: Secondary | ICD-10-CM | POA: Diagnosis not present
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
## 2024-03-18 ENCOUNTER — Ambulatory Visit (HOSPITAL_COMMUNITY): Payer: MEDICAID | Admitting: Clinical

## 2024-03-18 ENCOUNTER — Encounter (HOSPITAL_COMMUNITY): Payer: Self-pay

## 2024-03-18 NOTE — Progress Notes (Signed)
 Therapy Progress Note  Patient had an appointment scheduled with therapist on 03/18/2024  at 2:00pm.  She called to state she is with her father in a doctor's appointment and cannot attend.  Encounter Diagnosis  Name Primary?   Failure to attend appointment with reason given Yes     Elgie Crest, LCSW 03/18/2024, 2:03 PM
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
## 2024-03-25 ENCOUNTER — Encounter (HOSPITAL_COMMUNITY): Payer: Self-pay | Admitting: Psychiatry

## 2024-03-25 ENCOUNTER — Ambulatory Visit (INDEPENDENT_AMBULATORY_CARE_PROVIDER_SITE_OTHER): Payer: MEDICAID | Admitting: Clinical

## 2024-03-25 ENCOUNTER — Telehealth (HOSPITAL_COMMUNITY): Payer: MEDICAID | Admitting: Psychiatry

## 2024-03-25 ENCOUNTER — Encounter (HOSPITAL_COMMUNITY): Payer: Self-pay | Admitting: Clinical

## 2024-03-25 DIAGNOSIS — F900 Attention-deficit hyperactivity disorder, predominantly inattentive type: Secondary | ICD-10-CM | POA: Diagnosis not present

## 2024-03-25 DIAGNOSIS — F603 Borderline personality disorder: Secondary | ICD-10-CM | POA: Diagnosis not present

## 2024-03-25 DIAGNOSIS — F330 Major depressive disorder, recurrent, mild: Secondary | ICD-10-CM | POA: Diagnosis not present

## 2024-03-25 DIAGNOSIS — F1211 Cannabis abuse, in remission: Secondary | ICD-10-CM | POA: Diagnosis not present

## 2024-03-25 DIAGNOSIS — Z8659 Personal history of other mental and behavioral disorders: Secondary | ICD-10-CM

## 2024-03-25 MED ORDER — METHYLPHENIDATE HCL 5 MG PO TABS: 5.0000 mg | ORAL_TABLET | Freq: Every day | ORAL | 0 refills | Status: DC

## 2024-03-25 MED ORDER — METHYLPHENIDATE HCL ER (OSM) 36 MG PO TBCR: 36.0000 mg | EXTENDED_RELEASE_TABLET | ORAL | 0 refills | Status: DC

## 2024-03-25 MED ORDER — OXCARBAZEPINE 300 MG PO TABS: 450.0000 mg | ORAL_TABLET | Freq: Two times a day (BID) | ORAL | 0 refills | Status: DC

## 2024-03-25 MED ORDER — HYDROXYZINE HCL 25 MG PO TABS: 25.0000 mg | ORAL_TABLET | Freq: Three times a day (TID) | ORAL | 2 refills | Status: DC | PRN
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
## 2024-04-02 ENCOUNTER — Ambulatory Visit (HOSPITAL_COMMUNITY): Payer: MEDICAID | Admitting: Clinical

## 2024-04-02 ENCOUNTER — Encounter (HOSPITAL_COMMUNITY): Payer: Self-pay | Admitting: Clinical

## 2024-04-02 DIAGNOSIS — F330 Major depressive disorder, recurrent, mild: Secondary | ICD-10-CM

## 2024-04-02 DIAGNOSIS — F900 Attention-deficit hyperactivity disorder, predominantly inattentive type: Secondary | ICD-10-CM

## 2024-04-02 DIAGNOSIS — F603 Borderline personality disorder: Secondary | ICD-10-CM | POA: Diagnosis not present

## 2024-04-02 DIAGNOSIS — Z8659 Personal history of other mental and behavioral disorders: Secondary | ICD-10-CM | POA: Diagnosis not present
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
## 2024-04-07 ENCOUNTER — Encounter (HOSPITAL_COMMUNITY): Payer: Self-pay

## 2024-04-07 ENCOUNTER — Ambulatory Visit (HOSPITAL_COMMUNITY): Payer: MEDICAID | Admitting: Clinical

## 2024-04-07 NOTE — Progress Notes (Signed)
 Therapy Progress Note  Patient had an appointment scheduled with therapist on 04/07/2024  at 3:00pm virtually.  Patient called the office stating she had tonsillitis and could not join.This will be coded as a failure to attend appointment with reason given.   Encounter Diagnosis  Name Primary?   Failure to attend appointment with reason given Yes     Elgie Crest, LCSW 04/07/2024, 4:49 PM

## 2024-04-12 ENCOUNTER — Telehealth (HOSPITAL_COMMUNITY): Payer: Self-pay

## 2024-04-12 ENCOUNTER — Other Ambulatory Visit (HOSPITAL_COMMUNITY): Payer: Self-pay | Admitting: Psychiatry

## 2024-04-12 DIAGNOSIS — F900 Attention-deficit hyperactivity disorder, predominantly inattentive type: Secondary | ICD-10-CM

## 2024-04-12 DIAGNOSIS — F603 Borderline personality disorder: Secondary | ICD-10-CM

## 2024-04-12 MED ORDER — METHYLPHENIDATE HCL 5 MG PO TABS: 5.0000 mg | ORAL_TABLET | Freq: Every day | ORAL | 0 refills | Status: DC

## 2024-04-12 MED ORDER — OXCARBAZEPINE 300 MG PO TABS: 450.0000 mg | ORAL_TABLET | Freq: Two times a day (BID) | ORAL | 0 refills | Status: DC

## 2024-04-12 MED ORDER — METHYLPHENIDATE HCL ER (OSM) 36 MG PO TBCR: 36.0000 mg | EXTENDED_RELEASE_TABLET | ORAL | 0 refills | Status: DC

## 2024-04-12 MED ORDER — HYDROXYZINE HCL 25 MG PO TABS: 25.0000 mg | ORAL_TABLET | Freq: Three times a day (TID) | ORAL | 2 refills | Status: AC | PRN

## 2024-04-12 NOTE — Telephone Encounter (Signed)
 Called patient back and let her know her prescriptions had been resent.

## 2024-04-12 NOTE — Telephone Encounter (Signed)
 Patient called and very rudely informed me that you sent her Concerta  to the wrong pharmacy, please resend to [facility] [place]. Patient refused to go to [facility] this time to get it. She states she needs it now, she is out. Please review and advise, thank you

## 2024-04-14 ENCOUNTER — Ambulatory Visit (INDEPENDENT_AMBULATORY_CARE_PROVIDER_SITE_OTHER): Payer: MEDICAID | Admitting: Clinical

## 2024-04-14 ENCOUNTER — Encounter (HOSPITAL_COMMUNITY): Payer: Self-pay | Admitting: Clinical

## 2024-04-14 DIAGNOSIS — F603 Borderline personality disorder: Secondary | ICD-10-CM | POA: Diagnosis not present

## 2024-04-14 DIAGNOSIS — F900 Attention-deficit hyperactivity disorder, predominantly inattentive type: Secondary | ICD-10-CM | POA: Diagnosis not present

## 2024-04-14 DIAGNOSIS — Z8659 Personal history of other mental and behavioral disorders: Secondary | ICD-10-CM

## 2024-04-14 DIAGNOSIS — F330 Major depressive disorder, recurrent, mild: Secondary | ICD-10-CM | POA: Diagnosis not present
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
## 2024-04-20 ENCOUNTER — Ambulatory Visit (INDEPENDENT_AMBULATORY_CARE_PROVIDER_SITE_OTHER): Payer: MEDICAID | Admitting: Clinical

## 2024-04-20 DIAGNOSIS — F900 Attention-deficit hyperactivity disorder, predominantly inattentive type: Secondary | ICD-10-CM

## 2024-04-20 DIAGNOSIS — F330 Major depressive disorder, recurrent, mild: Secondary | ICD-10-CM | POA: Diagnosis not present

## 2024-04-20 DIAGNOSIS — Z8659 Personal history of other mental and behavioral disorders: Secondary | ICD-10-CM | POA: Diagnosis not present

## 2024-04-20 DIAGNOSIS — F603 Borderline personality disorder: Secondary | ICD-10-CM
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
## 2024-04-24 ENCOUNTER — Encounter (HOSPITAL_COMMUNITY): Payer: Self-pay | Admitting: Clinical

## 2024-04-27 ENCOUNTER — Encounter (HOSPITAL_COMMUNITY): Payer: Self-pay | Admitting: Clinical

## 2024-04-27 ENCOUNTER — Ambulatory Visit (INDEPENDENT_AMBULATORY_CARE_PROVIDER_SITE_OTHER): Payer: MEDICAID | Admitting: Clinical

## 2024-04-27 DIAGNOSIS — Z8659 Personal history of other mental and behavioral disorders: Secondary | ICD-10-CM | POA: Diagnosis not present

## 2024-04-27 DIAGNOSIS — F900 Attention-deficit hyperactivity disorder, predominantly inattentive type: Secondary | ICD-10-CM | POA: Diagnosis not present

## 2024-04-27 DIAGNOSIS — F330 Major depressive disorder, recurrent, mild: Secondary | ICD-10-CM | POA: Diagnosis not present

## 2024-04-27 DIAGNOSIS — F603 Borderline personality disorder: Secondary | ICD-10-CM
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
## 2024-05-04 ENCOUNTER — Ambulatory Visit (HOSPITAL_COMMUNITY): Payer: MEDICAID | Admitting: Clinical

## 2024-05-04 ENCOUNTER — Encounter (HOSPITAL_COMMUNITY): Payer: Self-pay

## 2024-05-04 NOTE — Progress Notes (Signed)
 Colleen Valentine    Patient contacted CSW to let her know she could not attend the appointment due to a conflict with her daughter's medical appointment.  As per [facility] policy, this will be noted as a no-show appointment.   Encounter Diagnosis  Name Primary?   Failure to attend appointment with reason given Yes       Elgie Crest, LCSW 05/04/2024, 6:00 PM

## 2024-05-05 ENCOUNTER — Other Ambulatory Visit (HOSPITAL_COMMUNITY): Payer: Self-pay | Admitting: Psychiatry

## 2024-05-05 DIAGNOSIS — F603 Borderline personality disorder: Secondary | ICD-10-CM

## 2024-05-11 ENCOUNTER — Ambulatory Visit (INDEPENDENT_AMBULATORY_CARE_PROVIDER_SITE_OTHER): Payer: MEDICAID | Admitting: Clinical
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
## 2024-05-12 ENCOUNTER — Encounter (HOSPITAL_COMMUNITY): Payer: Self-pay | Admitting: Clinical

## 2024-05-24 ENCOUNTER — Ambulatory Visit (HOSPITAL_COMMUNITY): Payer: MEDICAID
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
## 2024-06-03 ENCOUNTER — Encounter (HOSPITAL_COMMUNITY): Payer: Self-pay | Admitting: Psychiatry

## 2024-06-03 ENCOUNTER — Telehealth (HOSPITAL_COMMUNITY): Payer: MEDICAID | Admitting: Psychiatry

## 2024-06-03 DIAGNOSIS — F603 Borderline personality disorder: Secondary | ICD-10-CM

## 2024-06-03 DIAGNOSIS — F330 Major depressive disorder, recurrent, mild: Secondary | ICD-10-CM

## 2024-06-03 DIAGNOSIS — Z8659 Personal history of other mental and behavioral disorders: Secondary | ICD-10-CM | POA: Diagnosis not present

## 2024-06-03 DIAGNOSIS — F900 Attention-deficit hyperactivity disorder, predominantly inattentive type: Secondary | ICD-10-CM | POA: Diagnosis not present

## 2024-06-03 MED ORDER — OXCARBAZEPINE 300 MG PO TABS: 450.0000 mg | ORAL_TABLET | Freq: Two times a day (BID) | ORAL | 0 refills | Status: AC

## 2024-06-09 ENCOUNTER — Ambulatory Visit (HOSPITAL_COMMUNITY): Payer: MEDICAID | Admitting: Clinical

## 2024-06-09 ENCOUNTER — Encounter (HOSPITAL_COMMUNITY): Payer: Self-pay | Admitting: Clinical

## 2024-06-09 DIAGNOSIS — Z8659 Personal history of other mental and behavioral disorders: Secondary | ICD-10-CM

## 2024-06-09 DIAGNOSIS — F330 Major depressive disorder, recurrent, mild: Secondary | ICD-10-CM | POA: Diagnosis not present

## 2024-06-09 DIAGNOSIS — F900 Attention-deficit hyperactivity disorder, predominantly inattentive type: Secondary | ICD-10-CM | POA: Diagnosis not present

## 2024-06-09 DIAGNOSIS — F603 Borderline personality disorder: Secondary | ICD-10-CM | POA: Diagnosis not present

## 2024-06-09 DIAGNOSIS — F1210 Cannabis abuse, uncomplicated: Secondary | ICD-10-CM | POA: Diagnosis not present
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
## 2024-06-10 ENCOUNTER — Ambulatory Visit (HOSPITAL_COMMUNITY): Payer: MEDICAID

## 2024-06-10 ENCOUNTER — Other Ambulatory Visit (HOSPITAL_COMMUNITY): Payer: Self-pay | Admitting: Psychiatry

## 2024-06-10 DIAGNOSIS — F900 Attention-deficit hyperactivity disorder, predominantly inattentive type: Secondary | ICD-10-CM

## 2024-06-10 DIAGNOSIS — Z79899 Other long term (current) drug therapy: Secondary | ICD-10-CM

## 2024-06-10 MED ORDER — METHYLPHENIDATE HCL ER (OSM) 36 MG PO TBCR: 36.0000 mg | EXTENDED_RELEASE_TABLET | ORAL | 0 refills | Status: AC

## 2024-06-10 MED ORDER — METHYLPHENIDATE HCL 5 MG PO TABS: 5.0000 mg | ORAL_TABLET | Freq: Every day | ORAL | 0 refills | Status: AC

## 2024-06-10 NOTE — Progress Notes (Signed)
 Patient arrived this morning for her UDS. Patient presented well groomed and pleasant. Patient provided a sample. Rapid test showed positive fro THC, specimen sent to [facility] for confirmation.

## 2024-06-18 ENCOUNTER — Ambulatory Visit (HOSPITAL_COMMUNITY): Payer: MEDICAID | Admitting: Clinical

## 2024-06-18 DIAGNOSIS — Z8659 Personal history of other mental and behavioral disorders: Secondary | ICD-10-CM | POA: Diagnosis not present

## 2024-06-18 DIAGNOSIS — F603 Borderline personality disorder: Secondary | ICD-10-CM | POA: Diagnosis not present

## 2024-06-18 DIAGNOSIS — F1210 Cannabis abuse, uncomplicated: Secondary | ICD-10-CM

## 2024-06-18 DIAGNOSIS — F900 Attention-deficit hyperactivity disorder, predominantly inattentive type: Secondary | ICD-10-CM

## 2024-06-18 DIAGNOSIS — F330 Major depressive disorder, recurrent, mild: Secondary | ICD-10-CM
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
## 2024-06-20 LAB — TOXASSURE SELECT 13 (MW), URINE

## 2024-06-21 ENCOUNTER — Encounter (HOSPITAL_COMMUNITY): Payer: Self-pay | Admitting: Clinical

## 2024-06-23 ENCOUNTER — Ambulatory Visit (HOSPITAL_COMMUNITY): Payer: Self-pay | Admitting: Psychiatry

## 2024-06-29 ENCOUNTER — Ambulatory Visit (HOSPITAL_COMMUNITY): Payer: MEDICAID | Admitting: Clinical

## 2024-07-13 ENCOUNTER — Ambulatory Visit (HOSPITAL_COMMUNITY): Payer: MEDICAID | Admitting: Clinical

## 2024-08-26 ENCOUNTER — Telehealth (HOSPITAL_COMMUNITY): Payer: MEDICAID | Admitting: Psychiatry
